# Patient Record
Sex: Male | Born: 1945
Health system: Southern US, Community
[De-identification: ages and names within clinical notes are randomized; demographics above are authoritative.]

## PROBLEM LIST (undated history)

## (undated) ENCOUNTER — Emergency Department (HOSPITAL_COMMUNITY): Discharge status: Absent without leave | Payer: Self-pay

## (undated) DIAGNOSIS — I119 Hypertensive heart disease without heart failure: Secondary | ICD-10-CM

## (undated) DIAGNOSIS — I2510 Atherosclerotic heart disease of native coronary artery without angina pectoris: Secondary | ICD-10-CM

## (undated) DIAGNOSIS — I5022 Chronic systolic (congestive) heart failure: Secondary | ICD-10-CM

## (undated) DIAGNOSIS — G4733 Obstructive sleep apnea (adult) (pediatric): Secondary | ICD-10-CM

## (undated) DIAGNOSIS — E785 Hyperlipidemia, unspecified: Secondary | ICD-10-CM

## (undated) DIAGNOSIS — I2699 Other pulmonary embolism without acute cor pulmonale: Secondary | ICD-10-CM

## (undated) DIAGNOSIS — I480 Paroxysmal atrial fibrillation: Secondary | ICD-10-CM

## (undated) DIAGNOSIS — Z9289 Personal history of other medical treatment: Secondary | ICD-10-CM

## (undated) DIAGNOSIS — I219 Acute myocardial infarction, unspecified: Secondary | ICD-10-CM

## (undated) DIAGNOSIS — J45909 Unspecified asthma, uncomplicated: Secondary | ICD-10-CM

## (undated) DIAGNOSIS — I255 Ischemic cardiomyopathy: Secondary | ICD-10-CM

## (undated) DIAGNOSIS — I10 Essential (primary) hypertension: Secondary | ICD-10-CM

## (undated) DIAGNOSIS — I82409 Acute embolism and thrombosis of unspecified deep veins of unspecified lower extremity: Secondary | ICD-10-CM

## (undated) HISTORY — DX: Hypertensive heart disease without heart failure: I11.9

## (undated) HISTORY — PX: UMBILICAL HERNIA REPAIR: SHX196

## (undated) HISTORY — PX: TONSILLECTOMY: SUR1361

## (undated) HISTORY — PX: FRACTURE SURGERY: SHX138

## (undated) HISTORY — DX: Ischemic cardiomyopathy: I25.5

## (undated) HISTORY — DX: Atherosclerotic heart disease of native coronary artery without angina pectoris: I25.10

## (undated) HISTORY — DX: Chronic systolic (congestive) heart failure: I50.22

## (undated) HISTORY — PX: HERNIA REPAIR: SHX51

---

## 2013-04-01 DIAGNOSIS — Z9289 Personal history of other medical treatment: Secondary | ICD-10-CM

## 2013-04-01 HISTORY — DX: Personal history of other medical treatment: Z92.89

## 2014-12-02 DIAGNOSIS — I219 Acute myocardial infarction, unspecified: Secondary | ICD-10-CM

## 2014-12-02 DIAGNOSIS — I82409 Acute embolism and thrombosis of unspecified deep veins of unspecified lower extremity: Secondary | ICD-10-CM

## 2014-12-02 HISTORY — DX: Acute myocardial infarction, unspecified: I21.9

## 2014-12-02 HISTORY — DX: Acute embolism and thrombosis of unspecified deep veins of unspecified lower extremity: I82.409

## 2014-12-07 ENCOUNTER — Inpatient Hospital Stay (HOSPITAL_COMMUNITY)
Admission: EM | Admit: 2014-12-07 | Discharge: 2014-12-15 | DRG: 270 | Disposition: A | Discharge status: Home / Self Care | Payer: 59 | Source: Ambulatory Visit | Attending: Cardiology | Admitting: Cardiology

## 2014-12-07 ENCOUNTER — Encounter (HOSPITAL_COMMUNITY): Payer: Self-pay | Admitting: Physician Assistant

## 2014-12-07 ENCOUNTER — Encounter (HOSPITAL_COMMUNITY)
Admission: EM | Disposition: A | Discharge status: Home / Self Care | Payer: Medicare Other | Source: Ambulatory Visit | Attending: Cardiology

## 2014-12-07 ENCOUNTER — Inpatient Hospital Stay (HOSPITAL_COMMUNITY): Payer: 59

## 2014-12-07 DIAGNOSIS — I5041 Acute combined systolic (congestive) and diastolic (congestive) heart failure: Secondary | ICD-10-CM | POA: Insufficient documentation

## 2014-12-07 DIAGNOSIS — I2119 ST elevation (STEMI) myocardial infarction involving other coronary artery of inferior wall: Secondary | ICD-10-CM | POA: Diagnosis present

## 2014-12-07 DIAGNOSIS — R570 Cardiogenic shock: Secondary | ICD-10-CM | POA: Diagnosis not present

## 2014-12-07 DIAGNOSIS — M10071 Idiopathic gout, right ankle and foot: Secondary | ICD-10-CM | POA: Diagnosis not present

## 2014-12-07 DIAGNOSIS — Z8249 Family history of ischemic heart disease and other diseases of the circulatory system: Secondary | ICD-10-CM | POA: Diagnosis not present

## 2014-12-07 DIAGNOSIS — I4901 Ventricular fibrillation: Secondary | ICD-10-CM

## 2014-12-07 DIAGNOSIS — I82491 Acute embolism and thrombosis of other specified deep vein of right lower extremity: Secondary | ICD-10-CM | POA: Diagnosis not present

## 2014-12-07 DIAGNOSIS — I2510 Atherosclerotic heart disease of native coronary artery without angina pectoris: Secondary | ICD-10-CM | POA: Diagnosis present

## 2014-12-07 DIAGNOSIS — I2690 Septic pulmonary embolism without acute cor pulmonale: Secondary | ICD-10-CM | POA: Diagnosis not present

## 2014-12-07 DIAGNOSIS — R0600 Dyspnea, unspecified: Secondary | ICD-10-CM | POA: Diagnosis not present

## 2014-12-07 DIAGNOSIS — N179 Acute kidney failure, unspecified: Secondary | ICD-10-CM | POA: Diagnosis present

## 2014-12-07 DIAGNOSIS — I82441 Acute embolism and thrombosis of right tibial vein: Secondary | ICD-10-CM | POA: Diagnosis not present

## 2014-12-07 DIAGNOSIS — I110 Hypertensive heart disease with heart failure: Secondary | ICD-10-CM | POA: Diagnosis not present

## 2014-12-07 DIAGNOSIS — I442 Atrioventricular block, complete: Secondary | ICD-10-CM | POA: Diagnosis not present

## 2014-12-07 DIAGNOSIS — E785 Hyperlipidemia, unspecified: Secondary | ICD-10-CM | POA: Diagnosis present

## 2014-12-07 DIAGNOSIS — M1000 Idiopathic gout, unspecified site: Secondary | ICD-10-CM | POA: Diagnosis not present

## 2014-12-07 DIAGNOSIS — I959 Hypotension, unspecified: Secondary | ICD-10-CM | POA: Diagnosis present

## 2014-12-07 DIAGNOSIS — R319 Hematuria, unspecified: Secondary | ICD-10-CM | POA: Diagnosis not present

## 2014-12-07 DIAGNOSIS — I480 Paroxysmal atrial fibrillation: Secondary | ICD-10-CM | POA: Insufficient documentation

## 2014-12-07 DIAGNOSIS — I2111 ST elevation (STEMI) myocardial infarction involving right coronary artery: Secondary | ICD-10-CM | POA: Diagnosis not present

## 2014-12-07 DIAGNOSIS — I517 Cardiomegaly: Secondary | ICD-10-CM | POA: Diagnosis not present

## 2014-12-07 DIAGNOSIS — I2699 Other pulmonary embolism without acute cor pulmonale: Secondary | ICD-10-CM | POA: Diagnosis not present

## 2014-12-07 DIAGNOSIS — I10 Essential (primary) hypertension: Secondary | ICD-10-CM | POA: Diagnosis not present

## 2014-12-07 DIAGNOSIS — M7989 Other specified soft tissue disorders: Secondary | ICD-10-CM

## 2014-12-07 DIAGNOSIS — G4733 Obstructive sleep apnea (adult) (pediatric): Secondary | ICD-10-CM | POA: Diagnosis present

## 2014-12-07 DIAGNOSIS — I219 Acute myocardial infarction, unspecified: Secondary | ICD-10-CM

## 2014-12-07 DIAGNOSIS — I213 ST elevation (STEMI) myocardial infarction of unspecified site: Secondary | ICD-10-CM

## 2014-12-07 DIAGNOSIS — Z9861 Coronary angioplasty status: Secondary | ICD-10-CM

## 2014-12-07 HISTORY — DX: Obstructive sleep apnea (adult) (pediatric): G47.33

## 2014-12-07 HISTORY — PX: CARDIAC CATHETERIZATION: SHX172

## 2014-12-07 HISTORY — DX: Paroxysmal atrial fibrillation: I48.0

## 2014-12-07 HISTORY — DX: Essential (primary) hypertension: I10

## 2014-12-07 HISTORY — DX: Other pulmonary embolism without acute cor pulmonale: I26.99

## 2014-12-07 HISTORY — DX: Hyperlipidemia, unspecified: E78.5

## 2014-12-07 LAB — CBC WITH DIFFERENTIAL/PLATELET
BASOS ABS: 0 10*3/uL (ref 0.0–0.1)
Basophils Relative: 0 %
EOS ABS: 0.1 10*3/uL (ref 0.0–0.7)
EOS PCT: 1 %
HCT: 40.5 % (ref 39.0–52.0)
Hemoglobin: 13.3 g/dL (ref 13.0–17.0)
LYMPHS PCT: 11 %
Lymphs Abs: 1.3 10*3/uL (ref 0.7–4.0)
MCH: 29.8 pg (ref 26.0–34.0)
MCHC: 32.8 g/dL (ref 30.0–36.0)
MCV: 90.6 fL (ref 78.0–100.0)
MONO ABS: 0.8 10*3/uL (ref 0.1–1.0)
Monocytes Relative: 7 %
Neutro Abs: 9.4 10*3/uL — ABNORMAL HIGH (ref 1.7–7.7)
Neutrophils Relative %: 81 %
PLATELETS: 324 10*3/uL (ref 150–400)
RBC: 4.47 MIL/uL (ref 4.22–5.81)
RDW: 13.8 % (ref 11.5–15.5)
WBC: 11.5 10*3/uL — ABNORMAL HIGH (ref 4.0–10.5)

## 2014-12-07 LAB — APTT: APTT: 158 s — AB (ref 24–37)

## 2014-12-07 LAB — POCT ACTIVATED CLOTTING TIME: ACTIVATED CLOTTING TIME: 368 s

## 2014-12-07 LAB — BRAIN NATRIURETIC PEPTIDE: B NATRIURETIC PEPTIDE 5: 45 pg/mL (ref 0.0–100.0)

## 2014-12-07 LAB — POCT I-STAT, CHEM 8
BUN: 20 mg/dL (ref 6–20)
Calcium, Ion: 1.14 mmol/L (ref 1.13–1.30)
Chloride: 101 mmol/L (ref 101–111)
Creatinine, Ser: 1.2 mg/dL (ref 0.61–1.24)
Glucose, Bld: 208 mg/dL — ABNORMAL HIGH (ref 65–99)
HEMATOCRIT: 44 % (ref 39.0–52.0)
HEMOGLOBIN: 15 g/dL (ref 13.0–17.0)
Potassium: 4.3 mmol/L (ref 3.5–5.1)
SODIUM: 141 mmol/L (ref 135–145)
TCO2: 23 mmol/L (ref 0–100)

## 2014-12-07 LAB — COMPREHENSIVE METABOLIC PANEL
ALT: 73 U/L — ABNORMAL HIGH (ref 17–63)
ANION GAP: 8 (ref 5–15)
AST: 184 U/L — ABNORMAL HIGH (ref 15–41)
Albumin: 3.1 g/dL — ABNORMAL LOW (ref 3.5–5.0)
Alkaline Phosphatase: 53 U/L (ref 38–126)
BILIRUBIN TOTAL: 0.4 mg/dL (ref 0.3–1.2)
BUN: 16 mg/dL (ref 6–20)
CHLORIDE: 103 mmol/L (ref 101–111)
CO2: 25 mmol/L (ref 22–32)
Calcium: 8.3 mg/dL — ABNORMAL LOW (ref 8.9–10.3)
Creatinine, Ser: 1.31 mg/dL — ABNORMAL HIGH (ref 0.61–1.24)
GFR, EST NON AFRICAN AMERICAN: 54 mL/min — AB (ref >60)
Glucose, Bld: 199 mg/dL — ABNORMAL HIGH (ref 65–99)
POTASSIUM: 3.8 mmol/L (ref 3.5–5.1)
Sodium: 136 mmol/L (ref 135–145)
TOTAL PROTEIN: 5.3 g/dL — AB (ref 6.5–8.1)

## 2014-12-07 LAB — BASIC METABOLIC PANEL
Anion gap: 10 (ref 5–15)
BUN: 17 mg/dL (ref 6–20)
CHLORIDE: 105 mmol/L (ref 101–111)
CO2: 26 mmol/L (ref 22–32)
Calcium: 8.9 mg/dL (ref 8.9–10.3)
Creatinine, Ser: 1.12 mg/dL (ref 0.61–1.24)
GFR calc non Af Amer: >60 mL/min (ref >60)
Glucose, Bld: 181 mg/dL — ABNORMAL HIGH (ref 65–99)
POTASSIUM: 4.8 mmol/L (ref 3.5–5.1)
SODIUM: 141 mmol/L (ref 135–145)

## 2014-12-07 LAB — LACTIC ACID, PLASMA: Lactic Acid, Venous: 1.2 mmol/L (ref 0.5–2.0)

## 2014-12-07 LAB — LIPID PANEL
CHOL/HDL RATIO: 4.4 ratio
Cholesterol: 157 mg/dL (ref 0–200)
HDL: 36 mg/dL — ABNORMAL LOW (ref >40)
LDL Cholesterol: 109 mg/dL — ABNORMAL HIGH (ref 0–99)
Triglycerides: 61 mg/dL (ref <150)
VLDL: 12 mg/dL (ref 0–40)

## 2014-12-07 LAB — PROTIME-INR
INR: 9.06 (ref 0.00–1.49)
INR: 1.36 (ref 0.00–1.49)
PROTHROMBIN TIME: 70.3 s — AB (ref 11.6–15.2)
Prothrombin Time: 16.9 seconds — ABNORMAL HIGH (ref 11.6–15.2)

## 2014-12-07 LAB — GLUCOSE, CAPILLARY
GLUCOSE-CAPILLARY: 146 mg/dL — AB (ref 65–99)
GLUCOSE-CAPILLARY: 129 mg/dL — AB (ref 65–99)

## 2014-12-07 LAB — MAGNESIUM: MAGNESIUM: 1.9 mg/dL (ref 1.7–2.4)

## 2014-12-07 LAB — MRSA PCR SCREENING: MRSA BY PCR: NEGATIVE

## 2014-12-07 LAB — TROPONIN I: TROPONIN I: 32.08 ng/mL — AB (ref <0.031)

## 2014-12-07 LAB — HEPARIN LEVEL (UNFRACTIONATED): HEPARIN UNFRACTIONATED: 0.34 [IU]/mL (ref 0.30–0.70)

## 2014-12-07 LAB — TSH: TSH: 2.802 u[IU]/mL (ref 0.350–4.500)

## 2014-12-07 LAB — PLATELET COUNT: PLATELETS: 342 10*3/uL (ref 150–400)

## 2014-12-07 SURGERY — CORONARY STENT INTERVENTION

## 2014-12-07 MED ORDER — BIVALIRUDIN 250 MG IV SOLR
INTRAVENOUS | Status: AC
  Filled 2014-12-07: qty 250

## 2014-12-07 MED ORDER — SODIUM CHLORIDE 0.9 % IV SOLN: 250.0000 mL | INTRAVENOUS | Status: DC | PRN

## 2014-12-07 MED ORDER — SODIUM CHLORIDE 0.9 % WEIGHT BASED INFUSION
1.0000 mL/kg/h | INTRAVENOUS | Status: AC
  Administered 2014-12-07: 1 mL/kg/h via INTRAVENOUS

## 2014-12-07 MED ORDER — MIDAZOLAM HCL 2 MG/2ML IJ SOLN
INTRAMUSCULAR | Status: AC
  Filled 2014-12-07: qty 2

## 2014-12-07 MED ORDER — NITROGLYCERIN 0.4 MG SL SUBL: 0.4000 mg | SUBLINGUAL_TABLET | SUBLINGUAL | Status: DC | PRN

## 2014-12-07 MED ORDER — TICAGRELOR 90 MG PO TABS
ORAL_TABLET | ORAL | Status: AC
  Filled 2014-12-07: qty 1

## 2014-12-07 MED ORDER — AMIODARONE HCL IN DEXTROSE 360-4.14 MG/200ML-% IV SOLN
INTRAVENOUS | Status: AC
  Administered 2014-12-07: 83.3 mL
  Filled 2014-12-07: qty 200

## 2014-12-07 MED ORDER — FENTANYL CITRATE (PF) 100 MCG/2ML IJ SOLN
INTRAMUSCULAR | Status: AC
  Filled 2014-12-07: qty 2

## 2014-12-07 MED ORDER — TIROFIBAN HCL IN NACL 5-0.9 MG/100ML-% IV SOLN
0.1500 ug/kg/min | INTRAVENOUS | Status: AC
  Administered 2014-12-07 (×2): 0.15 ug/kg/min via INTRAVENOUS
  Filled 2014-12-07 (×5): qty 100

## 2014-12-07 MED ORDER — CETYLPYRIDINIUM CHLORIDE 0.05 % MT LIQD
7.0000 mL | Freq: Two times a day (BID) | OROMUCOSAL | Status: DC
  Administered 2014-12-07 – 2014-12-15 (×15): 7 mL via OROMUCOSAL

## 2014-12-07 MED ORDER — DEXTROSE 5 % IV SOLN
0.0000 ug/min | INTRAVENOUS | Status: DC
  Administered 2014-12-07: 1 ug/min via INTRAVENOUS
  Filled 2014-12-07: qty 4

## 2014-12-07 MED ORDER — SODIUM CHLORIDE 0.9 % IJ SOLN: 3.0000 mL | INTRAMUSCULAR | Status: DC | PRN

## 2014-12-07 MED ORDER — SODIUM CHLORIDE 0.9 % IJ SOLN
3.0000 mL | Freq: Two times a day (BID) | INTRAMUSCULAR | Status: DC
Start: 2014-12-07 — End: 2014-12-10
  Administered 2014-12-07 – 2014-12-10 (×6): 3 mL via INTRAVENOUS

## 2014-12-07 MED ORDER — HEPARIN (PORCINE) IN NACL 2-0.9 UNIT/ML-% IJ SOLN
INTRAMUSCULAR | Status: AC
  Filled 2014-12-07: qty 1000

## 2014-12-07 MED ORDER — TICAGRELOR 90 MG PO TABS
ORAL_TABLET | ORAL | Status: DC | PRN
  Administered 2014-12-07: 180 mg via ORAL

## 2014-12-07 MED ORDER — SODIUM CHLORIDE 0.9 % IJ SOLN
3.0000 mL | Freq: Two times a day (BID) | INTRAMUSCULAR | Status: DC
  Administered 2014-12-07 – 2014-12-10 (×7): 3 mL via INTRAVENOUS

## 2014-12-07 MED ORDER — HEPARIN (PORCINE) IN NACL 100-0.45 UNIT/ML-% IJ SOLN
1000.0000 [IU]/h | INTRAMUSCULAR | Status: DC
  Administered 2014-12-07: 1000 [IU]/h via INTRAVENOUS
  Filled 2014-12-07 (×3): qty 250

## 2014-12-07 MED ORDER — FENTANYL CITRATE (PF) 100 MCG/2ML IJ SOLN
INTRAMUSCULAR | Status: DC | PRN
  Administered 2014-12-07: 50 ug via INTRAVENOUS

## 2014-12-07 MED ORDER — TICAGRELOR 90 MG PO TABS
90.0000 mg | ORAL_TABLET | Freq: Two times a day (BID) | ORAL | Status: DC
Start: 2014-12-07 — End: 2014-12-12
  Administered 2014-12-07 – 2014-12-12 (×10): 90 mg via ORAL
  Filled 2014-12-07 (×10): qty 1

## 2014-12-07 MED ORDER — SODIUM CHLORIDE 0.9 % IV SOLN
1.7500 mg/kg/h | INTRAVENOUS | Status: DC
  Filled 2014-12-07: qty 250

## 2014-12-07 MED ORDER — ATORVASTATIN CALCIUM 80 MG PO TABS
80.0000 mg | ORAL_TABLET | Freq: Every day | ORAL | Status: DC
  Administered 2014-12-07 – 2014-12-14 (×8): 80 mg via ORAL
  Filled 2014-12-07 (×8): qty 1

## 2014-12-07 MED ORDER — ONDANSETRON HCL 4 MG/2ML IJ SOLN
4.0000 mg | Freq: Four times a day (QID) | INTRAMUSCULAR | Status: DC | PRN
  Administered 2014-12-07: 4 mg via INTRAVENOUS
  Filled 2014-12-07: qty 2

## 2014-12-07 MED ORDER — NOREPINEPHRINE BITARTRATE 1 MG/ML IV SOLN
INTRAVENOUS | Status: AC
  Filled 2014-12-07: qty 4

## 2014-12-07 MED ORDER — PANTOPRAZOLE SODIUM 40 MG PO TBEC
40.0000 mg | DELAYED_RELEASE_TABLET | Freq: Every day | ORAL | Status: DC
  Administered 2014-12-07 – 2014-12-15 (×10): 40 mg via ORAL
  Filled 2014-12-07 (×9): qty 1

## 2014-12-07 MED ORDER — INSULIN ASPART 100 UNIT/ML ~~LOC~~ SOLN
0.0000 [IU] | Freq: Three times a day (TID) | SUBCUTANEOUS | Status: DC
  Administered 2014-12-07: 2 [IU] via SUBCUTANEOUS
  Administered 2014-12-09: 3 [IU] via SUBCUTANEOUS

## 2014-12-07 MED ORDER — BIVALIRUDIN BOLUS VIA INFUSION - CUPID
INTRAVENOUS | Status: DC | PRN
  Administered 2014-12-07: 74.775 mg via INTRAVENOUS

## 2014-12-07 MED ORDER — MIDAZOLAM HCL 2 MG/2ML IJ SOLN
INTRAMUSCULAR | Status: DC | PRN
  Administered 2014-12-07 (×2): 1 mg via INTRAVENOUS

## 2014-12-07 MED ORDER — ASPIRIN 81 MG PO CHEW
81.0000 mg | CHEWABLE_TABLET | Freq: Every day | ORAL | Status: DC
  Administered 2014-12-07 – 2014-12-15 (×9): 81 mg via ORAL
  Filled 2014-12-07 (×9): qty 1

## 2014-12-07 MED ORDER — SODIUM CHLORIDE 0.9 % IV SOLN
250.0000 mg | INTRAVENOUS | Status: DC | PRN
  Administered 2014-12-07 (×2): 1.75 mg/kg/h via INTRAVENOUS

## 2014-12-07 MED ORDER — METOPROLOL TARTRATE 12.5 MG HALF TABLET
12.5000 mg | ORAL_TABLET | Freq: Two times a day (BID) | ORAL | Status: DC
  Administered 2014-12-08 – 2014-12-12 (×9): 12.5 mg via ORAL
  Filled 2014-12-07 (×9): qty 1

## 2014-12-07 MED ORDER — ACETAMINOPHEN 325 MG PO TABS: 650.0000 mg | ORAL_TABLET | ORAL | Status: DC | PRN

## 2014-12-07 MED ORDER — NITROGLYCERIN 1 MG/10 ML FOR IR/CATH LAB
INTRA_ARTERIAL | Status: AC
  Filled 2014-12-07: qty 10

## 2014-12-07 MED ORDER — NOREPINEPHRINE BITARTRATE 1 MG/ML IV SOLN
4.0000 mg | INTRAVENOUS | Status: DC | PRN
  Administered 2014-12-07: 1 ug/min via INTRAVENOUS

## 2014-12-07 MED ORDER — LIDOCAINE HCL (PF) 1 % IJ SOLN
INTRAMUSCULAR | Status: AC
  Filled 2014-12-07: qty 30

## 2014-12-07 MED ORDER — TIROFIBAN HCL IV 12.5 MG/250 ML
INTRAVENOUS | Status: DC | PRN
  Administered 2014-12-07: .15 ug/kg/min via INTRAVENOUS

## 2014-12-07 MED ORDER — TIROFIBAN HCL IV 12.5 MG/250 ML
INTRAVENOUS | Status: AC
  Filled 2014-12-07: qty 250

## 2014-12-07 MED ORDER — TIROFIBAN (AGGRASTAT) BOLUS VIA INFUSION
INTRAVENOUS | Status: DC | PRN
Start: 2014-12-07 — End: 2014-12-07
  Administered 2014-12-07: 2492.5 ug via INTRAVENOUS

## 2014-12-07 SURGICAL SUPPLY — 22 items
BALLN EMERGE MR 2.5X15 (BALLOONS) ×2
BALLN EMERGE MR 3.0X20 (BALLOONS) ×2
BALLN LINEAR 7.5FR IABP 40CC (BALLOONS) ×2
BALLOON EMERGE MR 2.5X15 (BALLOONS) ×1 IMPLANT
BALLOON EMERGE MR 3.0X20 (BALLOONS) ×1 IMPLANT
BALLOON LINEAR 7.5FR IABP 40CC (BALLOONS) ×1 IMPLANT
CATH EXTRAC PRONTO 5.5F 138CM (CATHETERS) ×2 IMPLANT
CATH INFINITI 5FR MULTPACK ANG (CATHETERS) ×2 IMPLANT
CATH S G BIP PACING (SET/KITS/TRAYS/PACK) ×2 IMPLANT
DEVICE SECURE STATLOCK IABP (MISCELLANEOUS) ×4 IMPLANT
GUIDE CATH RUNWAY 6FR AR1 SH (CATHETERS) IMPLANT
GUIDE CATH RUNWAY 6FR FR4 (CATHETERS) ×2 IMPLANT
KIT ENCORE 26 ADVANTAGE (KITS) ×2 IMPLANT
KIT HEART LEFT (KITS) ×2 IMPLANT
PACK CARDIAC CATHETERIZATION (CUSTOM PROCEDURE TRAY) ×2 IMPLANT
SHEATH PINNACLE 6F 10CM (SHEATH) ×4 IMPLANT
STENT PROMUS PREM MR 4.0X20 (Permanent Stent) ×2 IMPLANT
TRANSDUCER W/STOPCOCK (MISCELLANEOUS) ×2 IMPLANT
TUBING CIL FLEX 10 FLL-RA (TUBING) ×2 IMPLANT
WIRE ASAHI PROWATER 180CM (WIRE) ×2 IMPLANT
WIRE EMERALD 3MM-J .035X150CM (WIRE) ×2 IMPLANT
WIRE PT2 MS 185 (WIRE) ×2 IMPLANT

## 2014-12-07 NOTE — Code Documentation (Signed)
  Patient Name: Ryan Ward   MRN: [id number]   Date of Birth/ Sex: [birth year] , male      Admission Date: 12/07/2014  Attending Provider: Peter M Martinique, MD  Primary Diagnosis: ST elevation (STEMI) myocardial infarction involving right coronary artery [facility])   Indication: Pt was in his usual state of health until this AM, when he was noted to be in VFib. Code blue was subsequently called. At the time of arrival on scene, ACLS protocol was underway.   Technical Description:  - CPR performance duration:  5  minutes  - Was defibrillation or cardioversion used? Yes   - Was external pacer placed? No  - Was patient intubated pre/post CPR? No   Medications Administered: Y = Yes; Blank = No Amiodarone    Atropine    Calcium    Epinephrine    Lidocaine    Magnesium    Norepinephrine    Phenylephrine    Sodium bicarbonate    Vasopressin     Post CPR evaluation:  - Final Status - Was patient successfully resuscitated ? Yes - What is current rhythm? Normal sinus - What is current hemodynamic status? Stable  Miscellaneous Information:  - Labs sent, including:    - Primary team notified?  Yes  - Family Notified? Yes  - Additional notes/ transfer status:       Norval Gable, MD  12/07/2014, 9:17 AM

## 2014-12-07 NOTE — Plan of Care (Signed)
Problem: Consults Goal: Skin Care Protocol Initiated - if Braden Score 18 or less If consults are not indicated, leave blank or document N/A Outcome: Progressing Patient compliant and cooperative with Q2 turning  Problem: Phase I Progression Outcomes Goal: Neurologically stable/at baseline (CEA/CES) Outcome: Completed/Met Date Met:  12/07/14 Patient alert and oriented x4.  Goal: Peripheral pulses at baseline (AAA) Outcome: Progressing Able to doppler pedal pulses, palpable radial pulses. Goal: Pain controlled with appropriate interventions Outcome: Progressing Patient with no complaints of pain.

## 2014-12-07 NOTE — Progress Notes (Signed)
  Echocardiogram 2D Echocardiogram has been performed.  Darlina Sicilian M 12/07/2014, 3:43 PM

## 2014-12-07 NOTE — Progress Notes (Signed)
CRITICAL VALUE ALERT  Critical value received:  Troponin >65  Date of notification: 12-06-13  Time of notification:  Reviewed via lab work, no call  Critical value read back: yes  Nurse who received alert:  Josph Macho  MD notified (1st page):  MD Jose Persia  Time of first page:  2050  MD notified (2nd page):  Time of second page:  Responding MD:  MD Jose Persia  Time MD responded:   MD also notified of patient oozing around venous and arterial sheath present in right grown. Urine noted to be pink tinged from foley cath as well. No signs or symptoms of hematoma and patient hemodynamically stable. Pressure dressing applied to site and dressing reinforced after changing.  No new orders at this time, will continue to monitor the patient closely at this time.

## 2014-12-07 NOTE — H&P (Signed)
Patient ID: Ryan Ward MRN: [id number], DOB/AGE: 69-Jun-1947   Admit date: 12/07/2014   Primary Physician: No primary care provider on file. Primary Cardiologist: new  Pt. Profile:   69 year old Caucasian male with past medical history of hypertension, hyperlipidemia, obstructive sleep apnea not on CPAP presented with inferolateral STEMI  Problem List  Past Medical History  Diagnosis Date  . OSA (obstructive sleep apnea)     not using CPAP at home, does have machine  . Hypertension   . Hyperlipidemia     Past Surgical History  Procedure Laterality Date  . Hernia repair       Allergies  No Known Allergies  HPI  The patient is a 69 year old Caucasian male with past medical history of hypertension, hyperlipidemia, obstructive sleep apnea not on CPAP. He does have sleep apnea at home, however he has not used it. According to family, patient had a cold-like symptom and generalized malaise for the past 2 weeks. The symptoms eventually worsened to the degree that he decided to seek medical attention on 12/07/2014. Initial EKG was concerning for inferolateral STEMI, and he was sent to [facility] directly for emergent cardiac catheterization. During the cath, he had complete heart block and cardiogenic shock. He was placed on Levophed. Cardiac catheterization showed an 85% proximal to mid LAD, 95% mid LAD, 80% ostial left circumflex, 100% distal RCA, EF 35-45%. The RCA lesion was treated with a Promus DES.  Of note, during the interview post cath, patient went into ventricular fibrillation when pacing spikes landed on T-wave. He had spontaneous drop in the blood pressure. Patient appears to be pale and was unresponsive. CODE BLUE was initiated. CPR was immediately started. He was defibrillated once back into sinus rhythm. Patient was seen by Dr. Tamala Julian and was given 150 bolus IV amiodarone. Total duration of V. fib event was less than 1-2 minute. And the patient regarding his  consciousness after defibrillation.   Home Medications  Prior to Admission medications   Medication Sig Start Date End Date Taking? Authorizing Provider  Cholecalciferol (VITAMIN D3) 5000 UNITS TABS Take 1 tablet by mouth daily.   Yes Historical Provider, MD  Multiple Vitamins-Minerals (CENTRUM SILVER) tablet Take 1 tablet by mouth daily.   Yes Historical Provider, MD    Family History  Family History  Problem Relation Age of Onset  . Heart attack Father     Social History  Social History   Social History  . Marital Status: Married    Spouse Name: N/A  . Number of Children: N/A  . Years of Education: N/A   Occupational History  . Not on file.   Social History Main Topics  . Smoking status: Never Smoker   . Smokeless tobacco: Not on file  . Alcohol Use: No  . Drug Use: No  . Sexual Activity: Not on file   Other Topics Concern  . Not on file   Social History Narrative  . No narrative on file     Review of Systems General:  No chills, fever, night sweats or weight changes.  Cardiovascular:  No edema, orthopnea, palpitations, paroxysmal nocturnal dyspnea. +dyspnea Dermatological: No rash, lesions/masses Respiratory: No cough, dyspnea Urologic: No hematuria, dysuria Abdominal:   No vomiting, diarrhea, bright red blood per rectum, melena, or hematemesis +abdominal pain. Nausea Neurologic:  No visual changes, wkns, changes in mental status. All other systems reviewed and are otherwise negative except as noted above.  Physical Exam  Blood pressure 82/50, pulse 288,  resp. rate 0, height 5\' 8"  (1.727 m), weight 219 lb 12.8 oz (99.7 kg), SpO2 0 %.  General: Pleasant, NAD Psych: Normal affect. Neuro: Alert and oriented X 3. Moves all extremities spontaneously. HEENT: Normal  Neck: Supple without bruits or JVD. Lungs:  Resp regular and unlabored, CTA. Heart: RRR no s3, s4, or murmurs. Abdomen: Soft, non-tender, non-distended, BS + x 4.  Extremities: No clubbing,  cyanosis or edema. Pulse weak. Extremities cold  Labs  Troponin (Point of Care Test) No results for input(s): TROPIPOC in the last 72 hours.  Recent Labs  12/07/14 0750  TROPONINI 32.08*   Lab Results  Component Value Date   WBC 11.5* 12/07/2014   HGB 13.3 12/07/2014   HCT 40.5 12/07/2014   MCV 90.6 12/07/2014   PLT 324 12/07/2014     Recent Labs Lab 12/07/14 0750  NA 136  K 3.8  CL 103  CO2 25  BUN 16  CREATININE 1.31*  CALCIUM 8.3*  PROT 5.3*  BILITOT 0.4  ALKPHOS 53  ALT 73*  AST 184*  GLUCOSE 199*   Lab Results  Component Value Date   CHOL 157 12/07/2014   HDL 36* 12/07/2014   LDLCALC 109* 12/07/2014   TRIG 61 12/07/2014   No results found for: DDIMER   Radiology/Studies  No results found.  ECG  NSR with ST elevation in inferior lead and corresponding ST depression in anterior leads  Echocardiogram  pending    ASSESSMENT AND PLAN  1. Inferolateral STEMI  - cath 12/07/2014 85% proximal to mid LAD, 95% mid LAD, 80% ostial left circumflex, 100% distal RCA, EF 35-45%. Received Promus DES, cath report incomplete, presumably in distal RCA.  - pending echo. Trend enzyme. INR > 9 likely related to medication received in cath lab, will repeat INR in 6 hours  2. Vfib arrest: converted to NSR after defibillation  - pacing spike on T wave before went into vfib. S/p Code Blue, converted to NSR after defibillation.  - discussed with Dr. Tamala Julian, given 150mg  bolus amio, will hold off on amio drip  3. Complete heart block: had temp pacing wire in cath lab, discontinued later as he likely went into vfib related to inappropriate pacing  4. Cardiogenic shock: continue levophed, wean as tolerated  5. Untreated OSA: has CPAP has home, does not use it  - per nurse, patient has been having apnea episode post cath  5. HTN 6. HLD   Signed, Almyra Deforest, PA-C 12/07/2014, 9:46 AM Patient seen and examined and history reviewed. Agree with above findings and plan.  69 yo WM with history of HTN in past. Presents with acute onset of chest pain and diaphoresis that started aat 3 am. Ecg shows marked bradycardia with junctional rhythm versus Afib. Marked ST elevation in the inferior and lateral leads. ST depression suggests posterior involvement as well. Patient taken for emergent cardiac cath and PCI. RCA occlusion with extensive thrombus. PTCA and stenting of the distal RCA with DES. Procedure complicated by cardiogenic shock and IABP placed and IV pressors started. Temporary pacer placed for CHB. Developed Vfib post procedure after arrival in the ICU and was defibrillated x 1. Most likely reperfusion arrhythmia.  The patient is critically ill requires high complexity decision making for assessment and support, frequent evaluation and titration of therapies, application of advanced monitoring technologies and extensive interpretation of multiple databases.    Xenia Nile Martinique, Andrew 12/07/2014 11:34 AM

## 2014-12-07 NOTE — Progress Notes (Signed)
ANTICOAGULATION CONSULT NOTE - Initial Consult  Pharmacy Consult for Heparin and Tirofiban Indication: STEMI - s/p PCI to distal RCA; now with IABP  Allergies not on file  Patient Measurements: Height: 5\' 8"  (172.7 cm) Weight: 219 lb 12.8 oz (99.7 kg) IBW/kg (Calculated) : 68.4 Heparin Dosing Weight: 90 kg  Vital Signs:    Labs: No results for input(s): HGB, HCT, PLT, APTT, LABPROT, INR, HEPARINUNFRC, CREATININE, CKTOTAL, CKMB, TROPONINI in the last 72 hours.  CrCl cannot be calculated (Patient has no serum creatinine result on file.).   Medical History: No past medical history on file.  Medications:  Awaiting home med rec  Assessment: 69 y.o. M presented as STEMI - taken directly to cath lab s/p PCI to distal RCA, pt with multiple clots also requiring angiomax, ticagrelor, and tirofiban in cath lab. Also required IABP placement.   SCr, Hgb, Hct wnl on i-stat.  Goal of Therapy:  Heparin level 0.2-0.5 units/ml Monitor platelets by anticoagulation protocol: Yes   Plan:  Tirofiban 0.15 mcg/kg/min x 18hrs Heparin gtt 1000 units/hr Will f/u 6 hr heparin level and plt count Daily heparin level and CBC  Sherlon Handing, PharmD, BCPS Clinical pharmacist, pager [phone] 12/07/2014,7:50 AM

## 2014-12-07 NOTE — Progress Notes (Signed)
Utilization Review Completed.  

## 2014-12-07 NOTE — Progress Notes (Signed)
Pt is on CPAP at this time tolerating it well. Pt CPAP pressure is set at [employer]. No complications or distress noted. o2 bleed into the CPAP machine. Pt is stable at this time, RN aware

## 2014-12-07 NOTE — Progress Notes (Signed)
CRITICAL VALUE ALERT  Critical value received:  INR 9.06  Date of notification:  12/07/2014  Time of notification:  0910  Critical value read back:Yes.    Nurse who received alert:  Lily Kocher   MD notified (1st page):  Almyra Deforest (cards PA) at pts bedside  Time of first page:  0911  Responding MD:  Almyra Deforest (cards PA) at pts besdie  Time MD responded:  [phone]

## 2014-12-07 NOTE — Progress Notes (Signed)
Chaplain reported to the ED in response to a Code Stemi of patient who was taken directly to the Cath Lab.  Chaplain had no direct contact with patient at this time, but assisted the family to the Cath Lab to await the Dr. Report. Chaplain provided a prayer of comfort.  Chaplain will make referral to the assigned Unit Chaplain for follow-up.

## 2014-12-07 NOTE — Progress Notes (Signed)
ANTICOAGULATION CONSULT NOTE - FOLLOW UP    HL = 0.34 (goal 0.2 - 0.5 units/mL) Heparin dosing weight = 90 kg   Assessment: 55 YOM with STEMI s/p PCI to distal RCA and now with IABP.  Patient continues on IV heparin and tirofiban.  Heparin level is therapeutic.  No bleeding reported.   Plan: - Continue heparin gtt at 1000 units/hr - F/U AM labs   Jalan Bodi D. Mina Marble, PharmD, BCPS 12/07/2014, 6:45 PM

## 2014-12-07 NOTE — Progress Notes (Addendum)
   Called due to VF arrest post cath PCI for STEMI.  Had R on T pacer spike and then VF.   Brief CPR, reverted with Defib.[name] Potassium 3.8. Amiodarone 150 mg IV bolus x 1.  LVEDP 20 mmHg in lab. Had distal RCA stent. ECG post arrest with persistent STE due to distal emboli.  No ischemic CP post VF resuscitation.  Temporary pacer turned off and maintaining NSR .  Will speak with Dr. Martinique. Anticipate staged PCI vs CABG in future for residual LAD/Cfx.  Critical care time 35 minutes

## 2014-12-08 DIAGNOSIS — R319 Hematuria, unspecified: Secondary | ICD-10-CM

## 2014-12-08 DIAGNOSIS — I2510 Atherosclerotic heart disease of native coronary artery without angina pectoris: Secondary | ICD-10-CM

## 2014-12-08 DIAGNOSIS — I442 Atrioventricular block, complete: Secondary | ICD-10-CM

## 2014-12-08 DIAGNOSIS — Z9861 Coronary angioplasty status: Secondary | ICD-10-CM

## 2014-12-08 DIAGNOSIS — I4901 Ventricular fibrillation: Secondary | ICD-10-CM

## 2014-12-08 LAB — LIPID PANEL
Cholesterol: 148 mg/dL (ref 0–200)
HDL: 33 mg/dL — AB (ref >40)
LDL CALC: 96 mg/dL (ref 0–99)
TRIGLYCERIDES: 94 mg/dL (ref <150)
Total CHOL/HDL Ratio: 4.5 RATIO
VLDL: 19 mg/dL (ref 0–40)

## 2014-12-08 LAB — GLUCOSE, CAPILLARY
GLUCOSE-CAPILLARY: 114 mg/dL — AB (ref 65–99)
GLUCOSE-CAPILLARY: 94 mg/dL (ref 65–99)
Glucose-Capillary: 118 mg/dL — ABNORMAL HIGH (ref 65–99)
Glucose-Capillary: 127 mg/dL — ABNORMAL HIGH (ref 65–99)

## 2014-12-08 LAB — CBC
HCT: 40 % (ref 39.0–52.0)
Hemoglobin: 13.1 g/dL (ref 13.0–17.0)
MCH: 29.7 pg (ref 26.0–34.0)
MCHC: 32.8 g/dL (ref 30.0–36.0)
MCV: 90.7 fL (ref 78.0–100.0)
PLATELETS: 313 10*3/uL (ref 150–400)
RBC: 4.41 MIL/uL (ref 4.22–5.81)
RDW: 14.2 % (ref 11.5–15.5)
WBC: 17.4 10*3/uL — ABNORMAL HIGH (ref 4.0–10.5)

## 2014-12-08 LAB — HEPARIN LEVEL (UNFRACTIONATED): HEPARIN UNFRACTIONATED: 0.33 [IU]/mL (ref 0.30–0.70)

## 2014-12-08 LAB — BASIC METABOLIC PANEL
Anion gap: 7 (ref 5–15)
BUN: 18 mg/dL (ref 6–20)
CALCIUM: 8.4 mg/dL — AB (ref 8.9–10.3)
CO2: 24 mmol/L (ref 22–32)
CREATININE: 0.98 mg/dL (ref 0.61–1.24)
Chloride: 108 mmol/L (ref 101–111)
Glucose, Bld: 144 mg/dL — ABNORMAL HIGH (ref 65–99)
Potassium: 4.4 mmol/L (ref 3.5–5.1)
SODIUM: 139 mmol/L (ref 135–145)

## 2014-12-08 LAB — POCT ACTIVATED CLOTTING TIME: ACTIVATED CLOTTING TIME: 142 s

## 2014-12-08 LAB — HEMOGLOBIN A1C
HEMOGLOBIN A1C: 5.9 % — AB (ref 4.8–5.6)
MEAN PLASMA GLUCOSE: 123 mg/dL

## 2014-12-08 LAB — TROPONIN I

## 2014-12-08 MED ORDER — PNEUMOCOCCAL VAC POLYVALENT 25 MCG/0.5ML IJ INJ
0.5000 mL | INJECTION | INTRAMUSCULAR | Status: AC
  Administered 2014-12-13: 0.5 mL via INTRAMUSCULAR
  Filled 2014-12-08 (×3): qty 0.5

## 2014-12-08 MED FILL — Lidocaine HCl Local Preservative Free (PF) Inj 1%: INTRAMUSCULAR | Qty: 30 | Status: AC

## 2014-12-08 MED FILL — Nitroglycerin IV Soln 100 MCG/ML in D5W: INTRA_ARTERIAL | Qty: 10 | Status: AC

## 2014-12-08 MED FILL — Heparin Sodium (Porcine) 2 Unit/ML in Sodium Chloride 0.9%: INTRAMUSCULAR | Qty: 1000 | Status: AC

## 2014-12-08 NOTE — Care Management Note (Signed)
Case Management Note  Patient Details  Name: Ryan Ward MRN: [id number] Date of Birth: [birth year]  Subjective/Objective:   Pt reports that he has homes both in [place] and in [place], lives alone when he is in [place] and with significant other when he is in [place].  Was scheduled to appear for jury duty in [facility],  [place] will provide letter to fax to court.                              Expected Discharge Plan:  Home/Self Care  Discharge planning Services  CM Consult  Status of Service:  In process, will continue to follow  Girard Cooter, RN 12/08/2014, 1:52 PM

## 2014-12-08 NOTE — Progress Notes (Signed)
   12/08/14 2225  BiPAP/CPAP/SIPAP  BiPAP/CPAP/SIPAP Pt Type Adult  Mask Type Nasal mask  Mask Size Medium  IPAP 5 cmH20  EPAP 5 cmH2O  Flow Rate 3 lpm  BiPAP/CPAP/SIPAP CPAP  Patient Home Equipment No  Auto Titrate No  BiPAP/CPAP /SiPAP Vitals  Pulse Rate 90  Resp 18  SpO2 96 %  Patient placed on CPAP on above settings.

## 2014-12-08 NOTE — Progress Notes (Signed)
CARDIAC REHAB PHASE I   Spoke with pt RN, pt on bedrest this afternoon s/p sheath being pulled.  Will hold ambulation today and follow-up tomorrow.   Lenna Sciara, RN, BSN 12/08/2014 2:04 PM

## 2014-12-08 NOTE — Progress Notes (Signed)
Patient urine overnight progressing from pink tinged to red (blood) noted in foley bag.  PA [place] updated about patient condition at this time.  Patient remains hemodynamically stable, labs stable this am.  Still oozing around sites per previous conversation with MD Philbert Riser.  Day team to assess shortly, will continue to monitor the patient closely.

## 2014-12-08 NOTE — Progress Notes (Signed)
Patient Name: Ryan Ward Date of Encounter: 12/08/2014    SUBJECTIVE: Patient had a quiet night. No chest discomfort. Major issues have to do with bleeding around the balloon pump and hematuria. Aggrastat is been discontinued. Still on IV heparin.  TELEMETRY:  Normal sinus rhythm without significant ventricular ectopy or bradycardia. Filed Vitals:   12/08/14 0600 12/08/14 0700 12/08/14 0741 12/08/14 0800  BP: 94/82 111/76  99/78  Pulse: 87 82  27  Temp:   98.1 F (36.7 C)   TempSrc:   Oral   Resp: 18 23  20   Height:      Weight: 220 lb 7.4 oz (100 kg)     SpO2: 97% 98%  98%    Intake/Output Summary (Last 24 hours) at 12/08/14 0907 Last data filed at 12/08/14 0800  Gross per 24 hour  Intake 3870.6 ml  Output    845 ml  Net 3025.6 ml   LABS: Basic Metabolic Panel:  Recent Labs  12/07/14 0750 12/07/14 1340 12/08/14 0113  NA 136 141 139  K 3.8 4.8 4.4  CL 103 105 108  CO2 25 26 24   GLUCOSE 199* 181* 144*  BUN 16 17 18   CREATININE 1.31* 1.12 0.98  CALCIUM 8.3* 8.9 8.4*  MG 1.9  --   --    CBC:  Recent Labs  12/07/14 0750 12/07/14 1340 12/08/14 0113  WBC 11.5*  --  17.4*  NEUTROABS 9.4*  --   --   HGB 13.3  --  13.1  HCT 40.5  --  40.0  MCV 90.6  --  90.7  PLT 324 342 313   Cardiac Enzymes:  Recent Labs  12/07/14 1340 12/07/14 1910 12/08/14 0113  TROPONINI >65.00* >65.00* >65.00*    Fasting Lipid Panel:  Recent Labs  12/08/14 0113  CHOL 148  HDL 33*  LDLCALC 96  TRIG 94  CHOLHDL 4.5    Radiology/Studies:  No chest x-ray is available  Physical Exam: Blood pressure 99/78, pulse 27, temperature 98.1 F (36.7 C), temperature source Oral, resp. rate 20, height 5\' 8"  (1.727 m), weight 220 lb 7.4 oz (100 kg), SpO2 98 %. Weight change:   Wt Readings from Last 3 Encounters:  12/08/14 220 lb 7.4 oz (100 kg)   Lungs are clear anteriorly S4 gallop Oozing around intra-aortic balloon pump insertion site Neurologically  intact  ASSESSMENT:  1. Acute inferior infarction presenting with high-grade A-V block and cardiogenic shock. Successful management with reperfusion and temporary pacemaker insertion. 2. Ventricular fibrillation postprocedure related to pacer spike on T wave. 3. Cardiogenic shock with intra-aortic balloon pump, now weaning and 3:1 with plans for removal later this morning 4. Bleeding secondary to anticoagulation and antiplatelet regimen. Bleeding includes hematuria and peri-sheath oozing 4. Severe coronary artery disease with successful nice right coronary result. Right coronary is dominant. Circumflex is small. There is ostial 50-70% narrowing. Severe diffuse calcified proximal 80-90% LAD and focal distal 90% bifurcation lesion in LAD. LAD needs therapy, is complex for intervention but approachable. Plan will be for staged LAD intervention possibly with rotational atherectomy later this admission or electively in one to 2 weeks. This will depend upon the patient's clinical course once he begins to be mobile after removal of support apparatus. 5. Acute kidney injury related to hypotension and contrast, has resolved.   Plan:  1. Discontinue IV heparin 2. Continue dual antiplatelet therapy 3. Once INR is in range, discontinue both pacemaker and intra-aortic balloon pump 4. Start  low-dose beta blocker therapy as tolerated by heart rate 5. Phase I cardiac rehabilitation when ambulatory 6. Decrease IV fluid as tolerated by blood pressure 7. High-dose statin therapy 8. Coordinate interventional therapy with clinical course.  Valerie Roys W 12/08/2014, 9:07 AM

## 2014-12-08 NOTE — Progress Notes (Signed)
ANTICOAGULATION CONSULT NOTE - Follow Up Consult  Pharmacy Consult for Heparin  Indication: chest pain/ACS, s/p cath, IABP in place  No Known Allergies  Patient Measurements: Height: 5\' 8"  (172.7 cm) Weight: 219 lb 12.8 oz (99.7 kg) IBW/kg (Calculated) : 68.4  Vital Signs: Temp: 99 F (37.2 C) (12/06 2359) Temp Source: Oral (12/06 2359) BP: 110/63 mmHg (12/07 0100) Pulse Rate: 87 (12/07 0100)  Labs:  Recent Labs  12/07/14 0632  12/07/14 0750 12/07/14 1340 12/07/14 1500 12/07/14 1910 12/08/14 0113  HGB 15.0  --  13.3  --   --   --  13.1  HCT 44.0  --  40.5  --   --   --  40.0  PLT  --   --  324 342  --   --  313  APTT  --   --  158*  --   --   --   --   LABPROT  --   --  70.3* 16.9*  --   --   --   INR  --   --  9.06* 1.36  --   --   --   HEPARINUNFRC  --   --   --   --  0.34  --  0.33  CREATININE 1.20  --  1.31* 1.12  --   --  0.98  TROPONINI  --   < > 32.08* >65.00*  --  >65.00* >65.00*  < > = values in this interval not displayed.  Estimated Creatinine Clearance: 81.4 mL/min (by C-G formula based on Cr of 0.98).  Assessment: Heparin level therapeutic x 2, likely to have staged PCI or CABG in future per MD note  Goal of Therapy:  Heparin level 0.2-0.5 units/ml Monitor platelets by anticoagulation protocol: Yes   Plan:  -Continue heparin at 1000 units/hr -Daily CBC/HL -Monitor for bleeding  [name] 12/08/2014,2:47 AM

## 2014-12-08 NOTE — Progress Notes (Signed)
12:00pm Balloon pump and sheath removed from Rt Femoral Artery. Manual pressure held for 30 minutes. Hemostasis achieved. Initial BP 100/84 HR 87. Patient stable throughout sheath pull. 12:40pm Temporary Pacemaker and 48F Sheath removed from Rt Femoral Vein. Manual pressure held for 15 minutes. Hemostasis achieved. Post sheath pull BP 113/82, HR 84. 4x4 and tegaderm applied to Rt Femoral site. Patient states no pain. Post procedure bleeding precautions reviewed with patient. Irven Baltimore in the room and assessed groin site. Patient care transferred back to [facility]

## 2014-12-09 ENCOUNTER — Inpatient Hospital Stay (HOSPITAL_COMMUNITY): Payer: 59

## 2014-12-09 ENCOUNTER — Encounter (HOSPITAL_COMMUNITY): Payer: Self-pay | Admitting: *Deleted

## 2014-12-09 DIAGNOSIS — I5041 Acute combined systolic (congestive) and diastolic (congestive) heart failure: Secondary | ICD-10-CM

## 2014-12-09 LAB — CBC
HEMATOCRIT: 37.5 % — AB (ref 39.0–52.0)
Hemoglobin: 11.9 g/dL — ABNORMAL LOW (ref 13.0–17.0)
MCH: 29.4 pg (ref 26.0–34.0)
MCHC: 31.7 g/dL (ref 30.0–36.0)
MCV: 92.6 fL (ref 78.0–100.0)
PLATELETS: 253 10*3/uL (ref 150–400)
RBC: 4.05 MIL/uL — ABNORMAL LOW (ref 4.22–5.81)
RDW: 14.5 % (ref 11.5–15.5)
WBC: 13.7 10*3/uL — AB (ref 4.0–10.5)

## 2014-12-09 LAB — GLUCOSE, CAPILLARY
GLUCOSE-CAPILLARY: 160 mg/dL — AB (ref 65–99)
GLUCOSE-CAPILLARY: 101 mg/dL — AB (ref 65–99)
Glucose-Capillary: 100 mg/dL — ABNORMAL HIGH (ref 65–99)
Glucose-Capillary: 108 mg/dL — ABNORMAL HIGH (ref 65–99)

## 2014-12-09 LAB — RENAL FUNCTION PANEL
ANION GAP: 7 (ref 5–15)
Albumin: 3.1 g/dL — ABNORMAL LOW (ref 3.5–5.0)
BUN: 19 mg/dL (ref 6–20)
CALCIUM: 8.5 mg/dL — AB (ref 8.9–10.3)
CHLORIDE: 106 mmol/L (ref 101–111)
CO2: 27 mmol/L (ref 22–32)
CREATININE: 0.93 mg/dL (ref 0.61–1.24)
GFR calc non Af Amer: >60 mL/min (ref >60)
GLUCOSE: 142 mg/dL — AB (ref 65–99)
Phosphorus: 2 mg/dL — ABNORMAL LOW (ref 2.5–4.6)
Potassium: 4 mmol/L (ref 3.5–5.1)
SODIUM: 140 mmol/L (ref 135–145)

## 2014-12-09 LAB — HEPARIN LEVEL (UNFRACTIONATED): HEPARIN UNFRACTIONATED: 0.18 [IU]/mL — AB (ref 0.30–0.70)

## 2014-12-09 LAB — BRAIN NATRIURETIC PEPTIDE: B Natriuretic Peptide: 252.8 pg/mL — ABNORMAL HIGH (ref 0.0–100.0)

## 2014-12-09 MED ORDER — FUROSEMIDE 10 MG/ML IJ SOLN
40.0000 mg | Freq: Once | INTRAMUSCULAR | Status: AC
  Administered 2014-12-09: 40 mg via INTRAVENOUS
  Filled 2014-12-09: qty 4

## 2014-12-09 MED ORDER — HEPARIN BOLUS VIA INFUSION
2500.0000 [IU] | Freq: Once | INTRAVENOUS | Status: AC
  Administered 2014-12-09: 2500 [IU] via INTRAVENOUS
  Filled 2014-12-09: qty 2500

## 2014-12-09 MED ORDER — FUROSEMIDE 10 MG/ML IJ SOLN
20.0000 mg | Freq: Once | INTRAMUSCULAR | Status: AC
  Administered 2014-12-09: 20 mg via INTRAVENOUS
  Filled 2014-12-09: qty 2

## 2014-12-09 MED ORDER — HEPARIN BOLUS VIA INFUSION
3000.0000 [IU] | Freq: Once | INTRAVENOUS | Status: AC
  Administered 2014-12-09: 3000 [IU] via INTRAVENOUS
  Filled 2014-12-09: qty 3000

## 2014-12-09 MED ORDER — HEPARIN (PORCINE) IN NACL 100-0.45 UNIT/ML-% IJ SOLN
1500.0000 [IU]/h | INTRAMUSCULAR | Status: DC
  Administered 2014-12-09: 1100 [IU]/h via INTRAVENOUS
  Filled 2014-12-09 (×5): qty 250

## 2014-12-09 NOTE — Progress Notes (Signed)
Patient Name: Ryan Ward Date of Encounter: 12/09/2014    SUBJECTIVE: The patient has cough and congestion. No phlegm production. No recurrence of anginal quality discomfort. He denies dyspnea.  TELEMETRY:  Normal sinus rhythm/sinus tachycardia without ventricular ectopy or VT since discontinuation of amiodarone and pacemaker. Filed Vitals:   12/09/14 0713 12/09/14 0800 12/09/14 0900 12/09/14 0930  BP:  104/68 101/74 101/74  Pulse:  81 81 92  Temp: 98.3 F (36.8 C)     TempSrc: Oral     Resp:  26 17   Height:      Weight:      SpO2:  93% 96%     Intake/Output Summary (Last 24 hours) at 12/09/14 1108 Last data filed at 12/09/14 0900  Gross per 24 hour  Intake   2880 ml  Output    865 ml  Net   2015 ml   LABS: Basic Metabolic Panel:  Recent Labs  12/07/14 0750 12/07/14 1340 12/08/14 0113  NA 136 141 139  K 3.8 4.8 4.4  CL 103 105 108  CO2 25 26 24   GLUCOSE 199* 181* 144*  BUN 16 17 18   CREATININE 1.31* 1.12 0.98  CALCIUM 8.3* 8.9 8.4*  MG 1.9  --   --    CBC:  Recent Labs  12/07/14 0750  12/08/14 0113 12/09/14 0312  WBC 11.5*  --  17.4* 13.7*  NEUTROABS 9.4*  --   --   --   HGB 13.3  --  13.1 11.9*  HCT 40.5  --  40.0 37.5*  MCV 90.6  --  90.7 92.6  PLT 324  < > 313 253  < > = values in this interval not displayed. Cardiac Enzymes:  Recent Labs  12/07/14 1340 12/07/14 1910 12/08/14 0113  TROPONINI >65.00* >65.00* >65.00*   BNP: Invalid input(s): POCBNP Hemoglobin A1C:  Recent Labs  12/07/14 0750  HGBA1C 5.9*   Fasting Lipid Panel:  Recent Labs  12/08/14 0113  CHOL 148  HDL 33*  LDLCALC 96  TRIG 94  CHOLHDL 4.5    Radiology/Studies:  No chest x-ray performed this admission.  Physical Exam: Blood pressure 101/74, pulse 92, temperature 98.3 F (36.8 C), temperature source Oral, resp. rate 17, height 5\' 8"  (1.727 m), weight 221 lb 9 oz (100.5 kg), SpO2 96 %. Weight change: 1 lb 12.2 oz (0.8 kg)  Wt Readings from  Last 3 Encounters:  12/09/14 221 lb 9 oz (100.5 kg)    No pericardial friction rub, gallop, or murmur Moderate JVD with the patient lying at 45 Diminished breath sounds bilaterally with midlung field rales No peripheral edema. Right femoral cath site without hematoma. Pulse is 2+ and symmetric Neurological exam is normal  ASSESSMENT:  1. Status post acute inferior ST elevation myocardial infarction with associated cardiogenic shock. Successful right coronary reperfusion but, complicated by microembolization of the distal circulatory bed. 2. Ventricular fibrillation treated with defibrillation post cath. No return of ventricular ectopy. Episode felt to be related to pacemaker spike on T-wave. 3. Residual coronary artery disease, with staged LAD intervention pending either late in this admission or electively in several weeks. 4. Acute systolic heart failure with clinical evidence of pulmonary congestion.  Plan:  1. PA and lateral chest x-ray 2. IV Lasix 3. DC Foley 4. Phase I cardiac rehabilitation 5. Transfer to telemetry later today if ambulates without difficulty 6. Decision on staged LAD intervention will depend upon clinical course. Could be as early as  Monday or in several weeks.  [name] 12/09/2014, 11:08 AM

## 2014-12-09 NOTE — Progress Notes (Addendum)
CARDIAC REHAB PHASE I   PRE:  Rate/Rhythm: 92 SR c/ occasional PVC  BP:  Sitting: 119/75        SaO2: 99 2L, 96 RA  MODE:  Ambulation: 40 ft   POST:  Rate/Rhythm: 105 ST c/ occasional PVC  BP:  Sitting: 130/90         SaO2: 80% on 2L, 80% on  6L, 89-90% on venturi mask, 91-95% on NRB  Pt lying in bed, eager to ambulate. Pt sat on edge of bed, stated he felt weak, willing to ambulate with assistance. Pt ambulated 40 ft on 2L O2, IV, gait belt, foley catheter, assist x2 (RN followed with wheelchair), steady gait but weak with very limited activity tolerance. Pt c/o sudden onset shortness of breath, sat abruptly in wheelchair, pale, c/o lightheadedness. Pt sats 98% on 2L O2 in hallway, seated in wheelchair. RR high. Pt instructed in pursed lip breathing, pt states with some difficulty, "I can't catch my breath."  Pt returned to room in wheelchair, sats dropped to 80% on 2L O2. Sats 80% on 6L O2. Pt pale, RR 35-40, HR low 100s, BP 130/90. RN placed pt on venti mask, sats 89-90%. Pt placed on NRB, sats improved to 91-95%. Significant other at bedside, aware of pt condition. Pt assisted to recliner, feet elevated, RN at bedside, states she will notify MD. Will hold education until pt status improves. Will follow.  [id number]     Lenna Sciara, RN, BSN 12/09/2014 2:36 PM

## 2014-12-09 NOTE — Progress Notes (Signed)
ANTICOAGULATION CONSULT NOTE - Follow Up Consult  Pharmacy Consult for heparin  Indication: PE  No Known Allergies  Patient Measurements: Height: 5\' 8"  (172.7 cm) Weight: 221 lb 9 oz (100.5 kg) IBW/kg (Calculated) : 68.4 Heparin Dosing Weight: 90kg  Vital Signs: Temp: 99.1 F (37.3 C) (12/08 1954) Temp Source: Oral (12/08 1954) BP: 114/83 mmHg (12/08 2100) Pulse Rate: 105 (12/08 2100)  Labs:  Recent Labs  12/07/14 0750 12/07/14 1340 12/07/14 1500 12/07/14 1910 12/08/14 0113 12/09/14 0312 12/09/14 1300 12/09/14 2210  HGB 13.3  --   --   --  13.1 11.9*  --   --   HCT 40.5  --   --   --  40.0 37.5*  --   --   PLT 324 342  --   --  313 253  --   --   APTT 158*  --   --   --   --   --   --   --   LABPROT 70.3* 16.9*  --   --   --   --   --   --   INR 9.06* 1.36  --   --   --   --   --   --   HEPARINUNFRC  --   --  0.34  --  0.33  --   --  0.18*  CREATININE 1.31* 1.12  --   --  0.98  --  0.93  --   TROPONINI 32.08* >65.00*  --  >65.00* >65.00*  --   --   --     Estimated Creatinine Clearance: 86.1 mL/min (by C-G formula based on Cr of 0.93).   Medications:  Scheduled:  . antiseptic oral rinse  7 mL Mouth Rinse BID  . aspirin  81 mg Oral Daily  . atorvastatin  80 mg Oral q1800  . insulin aspart  0-15 Units Subcutaneous TID WC  . metoprolol tartrate  12.5 mg Oral BID  . pantoprazole  40 mg Oral Q1200  . pneumococcal 23 valent vaccine  0.5 mL Intramuscular Tomorrow-1000  . sodium chloride  3 mL Intravenous Q12H  . sodium chloride  3 mL Intravenous Q12H  . ticagrelor  90 mg Oral Q12H   Infusions:  . heparin 1,100 Units/hr (12/09/14 1800)    Assessment: 69 yo male s/p STEMI with PCI on 12/6 and IABP post cath (also with VF arrest post-cath). He was on on heparin and tirofiban post cath and these infusions were discontinued after IABP removal on 12/7. He is now noted with SOB and pharmacy has been consulted to dose heparin for PE.  -last heparin rate was 1000  units/hr and heparin level was at goal (HL= 0.33) -Hg= 11.9 (down from 13.1; may be dilutional), plt = 253  Goal of Therapy:  Heparin level 0.3-0.7 units/ml Monitor platelets by anticoagulation protocol: Yes   Plan:  -Heparin bolus 3000 units IV followed by 1100 units/hr  -Heparin level in 6 hours and daily wth CBC daily  Hildred Laser, Pharm D 12/09/2014 10:38 PM   Addendum -HL low, bolus 2500 units, increase rate to 1350 units/hr -Check level with AM labs -Monitor s/sx bleeding  Harvel Quale  12/09/2014 .10:41 PM

## 2014-12-09 NOTE — Progress Notes (Signed)
   Developed sudden dyspnea when ambulating   by rehab.  CXR with CHF  Recent instrumentation and off anticoagulation due to sheath pull, hematuria, and groin oozing.  Plan: IV Lasix; IV heparin, LE venous doppler, and consider CT pulm angio if he does not get dramatically better with diuresis.

## 2014-12-09 NOTE — Progress Notes (Signed)
ANTICOAGULATION CONSULT NOTE - Follow Up Consult  Pharmacy Consult for heparin  Indication: PE  No Known Allergies  Patient Measurements: Height: 5\' 8"  (172.7 cm) Weight: 221 lb 9 oz (100.5 kg) IBW/kg (Calculated) : 68.4 Heparin Dosing Weight: 90kg  Vital Signs: Temp: 98.2 F (36.8 C) (12/08 1229) Temp Source: Oral (12/08 1229) BP: 113/77 mmHg (12/08 1200) Pulse Rate: 85 (12/08 1300)  Labs:  Recent Labs  12/07/14 0750 12/07/14 1340 12/07/14 1500 12/07/14 1910 12/08/14 0113 12/09/14 0312 12/09/14 1300  HGB 13.3  --   --   --  13.1 11.9*  --   HCT 40.5  --   --   --  40.0 37.5*  --   PLT 324 342  --   --  313 253  --   APTT 158*  --   --   --   --   --   --   LABPROT 70.3* 16.9*  --   --   --   --   --   INR 9.06* 1.36  --   --   --   --   --   HEPARINUNFRC  --   --  0.34  --  0.33  --   --   CREATININE 1.31* 1.12  --   --  0.98  --  0.93  TROPONINI 32.08* >65.00*  --  >65.00* >65.00*  --   --     Estimated Creatinine Clearance: 86.1 mL/min (by C-G formula based on Cr of 0.93).   Medications:  Scheduled:  . antiseptic oral rinse  7 mL Mouth Rinse BID  . aspirin  81 mg Oral Daily  . atorvastatin  80 mg Oral q1800  . furosemide  40 mg Intravenous Once  . insulin aspart  0-15 Units Subcutaneous TID WC  . metoprolol tartrate  12.5 mg Oral BID  . pantoprazole  40 mg Oral Q1200  . pneumococcal 23 valent vaccine  0.5 mL Intramuscular Tomorrow-1000  . sodium chloride  3 mL Intravenous Q12H  . sodium chloride  3 mL Intravenous Q12H  . ticagrelor  90 mg Oral Q12H   Infusions:    Assessment: 69 yo male s/p STEMI with PCI on 12/6 and IABP post cath (also with VF arrest post-cath). He was on on heparin and tirofiban post cath and these infusions were discontinued after IABP removal on 12/7. He is now noted with SOB and pharmacy has been consulted to dose heparin for PE.  -last heparin rate was 1000 units/hr and heparin level was at goal (HL= 0.33) -Hg= 11.9 (down from  13.1; may be dilutional), plt = 253  Goal of Therapy:  Heparin level 0.3-0.7 units/ml Monitor platelets by anticoagulation protocol: Yes   Plan:  -Heparin bolus 3000 units IV followed by 1100 units/hr  -Heparin level in 6 hours and daily wth CBC daily  Hildred Laser, Pharm D 12/09/2014 3:34 PM

## 2014-12-10 ENCOUNTER — Inpatient Hospital Stay (HOSPITAL_COMMUNITY): Payer: 59

## 2014-12-10 ENCOUNTER — Telehealth: Payer: Self-pay | Admitting: Cardiology

## 2014-12-10 DIAGNOSIS — M7989 Other specified soft tissue disorders: Secondary | ICD-10-CM

## 2014-12-10 DIAGNOSIS — R0600 Dyspnea, unspecified: Secondary | ICD-10-CM

## 2014-12-10 LAB — CBC
HCT: 34 % — ABNORMAL LOW (ref 39.0–52.0)
Hemoglobin: 11.2 g/dL — ABNORMAL LOW (ref 13.0–17.0)
MCH: 30.2 pg (ref 26.0–34.0)
MCHC: 32.9 g/dL (ref 30.0–36.0)
MCV: 91.6 fL (ref 78.0–100.0)
PLATELETS: 227 10*3/uL (ref 150–400)
RBC: 3.71 MIL/uL — ABNORMAL LOW (ref 4.22–5.81)
RDW: 14.3 % (ref 11.5–15.5)
WBC: 12.6 10*3/uL — ABNORMAL HIGH (ref 4.0–10.5)

## 2014-12-10 LAB — GLUCOSE, CAPILLARY
GLUCOSE-CAPILLARY: 86 mg/dL (ref 65–99)
GLUCOSE-CAPILLARY: 77 mg/dL (ref 65–99)
Glucose-Capillary: 99 mg/dL (ref 65–99)
Glucose-Capillary: 85 mg/dL (ref 65–99)

## 2014-12-10 LAB — RENAL FUNCTION PANEL
Albumin: 3 g/dL — ABNORMAL LOW (ref 3.5–5.0)
Anion gap: 10 (ref 5–15)
BUN: 18 mg/dL (ref 6–20)
CHLORIDE: 100 mmol/L — AB (ref 101–111)
CO2: 29 mmol/L (ref 22–32)
Calcium: 8.7 mg/dL — ABNORMAL LOW (ref 8.9–10.3)
Creatinine, Ser: 1 mg/dL (ref 0.61–1.24)
GFR calc Af Amer: >60 mL/min (ref >60)
GLUCOSE: 143 mg/dL — AB (ref 65–99)
POTASSIUM: 3.6 mmol/L (ref 3.5–5.1)
Phosphorus: 2.7 mg/dL (ref 2.5–4.6)
Sodium: 139 mmol/L (ref 135–145)

## 2014-12-10 LAB — HEPARIN LEVEL (UNFRACTIONATED)
HEPARIN UNFRACTIONATED: 0.24 [IU]/mL — AB (ref 0.30–0.70)
Heparin Unfractionated: 0.59 IU/mL (ref 0.30–0.70)

## 2014-12-10 MED ORDER — HEPARIN BOLUS VIA INFUSION
2000.0000 [IU] | Freq: Once | INTRAVENOUS | Status: AC
  Administered 2014-12-10: 2000 [IU] via INTRAVENOUS
  Filled 2014-12-10: qty 2000

## 2014-12-10 MED ORDER — POLYETHYLENE GLYCOL 3350 17 G PO PACK
17.0000 g | PACK | Freq: Once | ORAL | Status: AC
  Administered 2014-12-10: 17 g via ORAL
  Filled 2014-12-10: qty 1

## 2014-12-10 MED ORDER — POTASSIUM CHLORIDE 10 MEQ/50ML IV SOLN: 10.0000 meq | INTRAVENOUS | Status: DC

## 2014-12-10 MED ORDER — FUROSEMIDE 10 MG/ML IJ SOLN
40.0000 mg | Freq: Two times a day (BID) | INTRAMUSCULAR | Status: AC
  Administered 2014-12-10 (×2): 40 mg via INTRAVENOUS
  Filled 2014-12-10 (×2): qty 4

## 2014-12-10 NOTE — Progress Notes (Addendum)
       Patient Name: Ryan Ward Date of Encounter: 12/10/2014    SUBJECTIVE: Still has dyspnea if he moves around too vigorously. He denies chest pain.  TELEMETRY:  Heart rate is 88 bpm. O2 saturation 100% on 2 L cannula Filed Vitals:   12/10/14 0500 12/10/14 0600 12/10/14 0700 12/10/14 0800  BP: 110/79 109/78 106/80 115/69  Pulse: 88 92 77 85  Temp:    98 F (36.7 C)  TempSrc:    Oral  Resp: 25 29 20 18   Height:      Weight:  220 lb 10.9 oz (100.1 kg)    SpO2: 94% 95% 97% 100%    Intake/Output Summary (Last 24 hours) at 12/10/14 0941 Last data filed at 12/10/14 0800  Gross per 24 hour  Intake   1102 ml  Output   3290 ml  Net  -2188 ml   LABS: Basic Metabolic Panel:  Recent Labs  12/08/14 0113 12/09/14 1300  NA 139 140  K 4.4 4.0  CL 108 106  CO2 24 27  GLUCOSE 144* 142*  BUN 18 19  CREATININE 0.98 0.93  CALCIUM 8.4* 8.5*  PHOS  --  2.0*   CBC:  Recent Labs  12/09/14 0312 12/10/14 0350  WBC 13.7* 12.6*  HGB 11.9* 11.2*  HCT 37.5* 34.0*  MCV 92.6 91.6  PLT 253 227   Cardiac Enzymes:  Recent Labs  12/07/14 1340 12/07/14 1910 12/08/14 0113  TROPONINI >65.00* >65.00* >65.00*   Fasting Lipid Panel:  Recent Labs  12/08/14 0113  CHOL 148  HDL 33*  LDLCALC 96  TRIG 94  CHOLHDL 4.5    Radiology/Studies:  Chest x-ray 12/09/14 IMPRESSION: 1. Cardiomegaly and changes of mild edema. 2. More confluent opacity at the bases is favored to represent atelectasis or infiltrates Physical Exam: Blood pressure 115/69, pulse 85, temperature 98 F (36.7 C), temperature source Oral, resp. rate 18, height 5\' 8"  (1.727 m), weight 220 lb 10.9 oz (100.1 kg), SpO2 100 %. Weight change: -14.1 oz (-0.4 kg)  Wt Readings from Last 3 Encounters:  12/10/14 220 lb 10.9 oz (100.1 kg)   Mild neck vein distention Decreased breath sounds at both bases with mid lung field rales Cardiac exam reveals no rub. S4 gallop is audible. No peripheral edema Femoral cath  site is without evidence of hematoma ASSESSMENT:  1. Large inferior myocardial infarction complicated by cardiogenic shock and microcirculatory embolization at the time of PCI. 2. Ventricular fibrillation, resolved and has not recurred 3. Acute combined systolic and diastolic heart failure, with improvement on diuresis. Dyspnea however continues this morning. Further diuresis is planned. If dyspnea continues, will need CT angioma rule out pulmonary embolism. 4. Residual complex LAD disease which will require PCI. He'll likely be done after he fully recovers from this event. Therefore, plan is medical therapy, discharge home, follow-up with Dr. Martinique, and staged PCI in several weeks.  Plan:  1. Diuresis: will give Lasix 40 mg IV BID today. May need more tomorrow to resolve CHF. 2. If persistent dyspnea despite adequate diuresis, consider PE which will need to be excluded by performing pulmonary CT angiography 3. IV heparin was resumed yesterday after severe dyspnea developed on the outside possibility that dyspnea was PE related. If dyspnea resolves with diuresis, heparin can be discontinued. 4. Attempt ambulation later today. 5. Not yet ready for transfer  Ryan Ward 12/10/2014, 9:41 AM

## 2014-12-10 NOTE — Telephone Encounter (Signed)
New Message  Pt is in the hospital. Has jury duty on 12/14/2014. Requests to have a letter drawn up and ready for pick up stating that the pt is incapable of serving for jury duty. Please assist

## 2014-12-10 NOTE — Progress Notes (Signed)
Pt transferred to 3w27 temp 97.9, HR 85,  B/P121/69, o2 sat 92% on room air.

## 2014-12-10 NOTE — Progress Notes (Signed)
ANTICOAGULATION CONSULT NOTE - Follow Up Consult  Pharmacy Consult for heparin  Indication: PE  No Known Allergies  Patient Measurements: Height: 5\' 8"  (172.7 cm) Weight: 220 lb 10.9 oz (100.1 kg) IBW/kg (Calculated) : 68.4 Heparin Dosing Weight: 90kg  Vital Signs: Temp: 98 F (36.7 C) (12/09 1226) Temp Source: Oral (12/09 1226) BP: 109/73 mmHg (12/09 1400) Pulse Rate: 90 (12/09 1400)  Labs:  Recent Labs  12/07/14 1910  12/08/14 0113 12/09/14 0312 12/09/14 1300 12/09/14 2210 12/10/14 0350 12/10/14 1434  HGB  --   < > 13.1 11.9*  --   --  11.2*  --   HCT  --   --  40.0 37.5*  --   --  34.0*  --   PLT  --   --  313 253  --   --  227  --   HEPARINUNFRC  --   < > 0.33  --   --  0.18* 0.24* 0.59  CREATININE  --   --  0.98  --  0.93  --   --  1.00  TROPONINI >65.00*  --  >65.00*  --   --   --   --   --   < > = values in this interval not displayed.  Estimated Creatinine Clearance: 80 mL/min (by C-G formula based on Cr of 1).   Medications:  Scheduled:  . antiseptic oral rinse  7 mL Mouth Rinse BID  . aspirin  81 mg Oral Daily  . atorvastatin  80 mg Oral q1800  . furosemide  40 mg Intravenous BID  . insulin aspart  0-15 Units Subcutaneous TID WC  . metoprolol tartrate  12.5 mg Oral BID  . pantoprazole  40 mg Oral Q1200  . pneumococcal 23 valent vaccine  0.5 mL Intramuscular Tomorrow-1000  . sodium chloride  3 mL Intravenous Q12H  . sodium chloride  3 mL Intravenous Q12H  . ticagrelor  90 mg Oral Q12H   Infusions:  . heparin 1,500 Units/hr (12/10/14 1400)    Assessment: 69 yo male s/p STEMI with PCI on 12/6 and IABP post cath (also with VF arrest post-cath). He was on on heparin and tirofiban post cath and these infusions were discontinued after IABP removal on 12/7. He is now noted with DVT and for r/o PE and heparin has been restarted. -doppler on 12/9: Positive for acute deep vein thrombosis involving the right posterior tibial and peroneal veins. -HL=  0.59  Goal of Therapy:  Heparin level 0.3-0.7 units/ml Monitor platelets by anticoagulation protocol: Yes   Plan:  -No heparin changes needed -Daily heparin level and CBC  Hildred Laser, Pharm D 12/10/2014 3:25 PM

## 2014-12-10 NOTE — Progress Notes (Signed)
ANTICOAGULATION CONSULT NOTE - Follow Up Consult  Pharmacy Consult for Heparin  Indication: Possible PE  No Known Allergies  Patient Measurements: Height: 5\' 8"  (172.7 cm) Weight: 221 lb 9 oz (100.5 kg) IBW/kg (Calculated) : 68.4 Vital Signs: Temp: 98.9 F (37.2 C) (12/09 0353) Temp Source: Oral (12/09 0353) BP: 110/79 mmHg (12/09 0500) Pulse Rate: 88 (12/09 0500)  Labs:  Recent Labs  12/07/14 0750 12/07/14 1340  12/07/14 1910 12/08/14 0113 12/09/14 0312 12/09/14 1300 12/09/14 2210 12/10/14 0350  HGB 13.3  --   --   --  13.1 11.9*  --   --  11.2*  HCT 40.5  --   --   --  40.0 37.5*  --   --  34.0*  PLT 324 342  --   --  313 253  --   --  227  APTT 158*  --   --   --   --   --   --   --   --   LABPROT 70.3* 16.9*  --   --   --   --   --   --   --   INR 9.06* 1.36  --   --   --   --   --   --   --   HEPARINUNFRC  --   --   < >  --  0.33  --   --  0.18* 0.24*  CREATININE 1.31* 1.12  --   --  0.98  --  0.93  --   --   TROPONINI 32.08* >65.00*  --  >65.00* >65.00*  --   --   --   --   < > = values in this interval not displayed.  Estimated Creatinine Clearance: 86.1 mL/min (by C-G formula based on Cr of 0.93).   Assessment: Recent IABP (now out), on heparin for rule out PE after sudden dyspnea when ambulating, no issues per RN.   Goal of Therapy:  Heparin level 0.3-0.7 units/ml Monitor platelets by anticoagulation protocol: Yes   Plan:  -Heparin 2000 units BOLUS -Increase heparin drip to 1500 units/hr -1400 HL -Daily CBC/HL -Monitor for bleeding  Ryan Ward 12/10/2014,6:26 AM

## 2014-12-10 NOTE — Clinical Social Work Note (Signed)
CSW received consult for court note.  [place] has been made aware (12/08/2014).  CSW faxed said letter.  Family would like a copy of this letter.  This letter has been placed on the patient's chart.  Nonnie Done, LCSW [phone]  [place] [id number]  Licensed Clinical Social Worker

## 2014-12-10 NOTE — Telephone Encounter (Signed)
Returned call to patient.Spoke to daughter she stated father in hospital with recent heart attack.Stated he needed a letter to excuse him from jury duty.Advised letter will be left at [facility] office front desk.

## 2014-12-10 NOTE — Progress Notes (Signed)
Noted DVT and possible plan for CT for PE. Will hold ambulation today. Will f/u tomorrow. Yves Dill CES, ACSM 1:22 PM 12/10/2014

## 2014-12-10 NOTE — Progress Notes (Signed)
Preliminary results by tech - Venous Duplex Lower Ext. Completed. Positive for acute deep vein thrombosis involving the right posterior tibial and peroneal veins. No evidence of deep or superficial vein thrombosis in the left leg. Results given to patient's nurse. Oda Cogan, BS, RDMS, RVT

## 2014-12-11 LAB — RENAL FUNCTION PANEL
Albumin: 2.8 g/dL — ABNORMAL LOW (ref 3.5–5.0)
Anion gap: 10 (ref 5–15)
BUN: 19 mg/dL (ref 6–20)
CALCIUM: 8.6 mg/dL — AB (ref 8.9–10.3)
CO2: 29 mmol/L (ref 22–32)
CREATININE: 1.02 mg/dL (ref 0.61–1.24)
Chloride: 101 mmol/L (ref 101–111)
GFR calc Af Amer: >60 mL/min (ref >60)
GFR calc non Af Amer: >60 mL/min (ref >60)
GLUCOSE: 111 mg/dL — AB (ref 65–99)
Phosphorus: 3.3 mg/dL (ref 2.5–4.6)
Potassium: 3.2 mmol/L — ABNORMAL LOW (ref 3.5–5.1)
SODIUM: 140 mmol/L (ref 135–145)

## 2014-12-11 LAB — GLUCOSE, CAPILLARY
GLUCOSE-CAPILLARY: 89 mg/dL (ref 65–99)
GLUCOSE-CAPILLARY: 99 mg/dL (ref 65–99)
Glucose-Capillary: 93 mg/dL (ref 65–99)
Glucose-Capillary: 117 mg/dL — ABNORMAL HIGH (ref 65–99)

## 2014-12-11 LAB — CBC
HCT: 36.4 % — ABNORMAL LOW (ref 39.0–52.0)
HEMOGLOBIN: 12.4 g/dL — AB (ref 13.0–17.0)
MCH: 30.5 pg (ref 26.0–34.0)
MCHC: 34.1 g/dL (ref 30.0–36.0)
MCV: 89.4 fL (ref 78.0–100.0)
PLATELETS: 267 10*3/uL (ref 150–400)
RBC: 4.07 MIL/uL — ABNORMAL LOW (ref 4.22–5.81)
RDW: 14 % (ref 11.5–15.5)
WBC: 13 10*3/uL — ABNORMAL HIGH (ref 4.0–10.5)

## 2014-12-11 LAB — HEPARIN LEVEL (UNFRACTIONATED): HEPARIN UNFRACTIONATED: 0.35 [IU]/mL (ref 0.30–0.70)

## 2014-12-11 MED ORDER — FUROSEMIDE 10 MG/ML IJ SOLN
40.0000 mg | Freq: Two times a day (BID) | INTRAMUSCULAR | Status: DC
  Administered 2014-12-11 – 2014-12-14 (×7): 40 mg via INTRAVENOUS
  Filled 2014-12-11 (×7): qty 4

## 2014-12-11 MED ORDER — RIVAROXABAN 15 MG PO TABS
15.0000 mg | ORAL_TABLET | Freq: Two times a day (BID) | ORAL | Status: DC
  Administered 2014-12-11 – 2014-12-15 (×8): 15 mg via ORAL
  Filled 2014-12-11 (×8): qty 1

## 2014-12-11 MED ORDER — POTASSIUM CHLORIDE CRYS ER 20 MEQ PO TBCR
40.0000 meq | EXTENDED_RELEASE_TABLET | Freq: Two times a day (BID) | ORAL | Status: DC
  Administered 2014-12-11 – 2014-12-14 (×8): 40 meq via ORAL
  Filled 2014-12-11 (×8): qty 2

## 2014-12-11 MED ORDER — RIVAROXABAN 20 MG PO TABS: 20.0000 mg | ORAL_TABLET | Freq: Every day | ORAL | Status: DC

## 2014-12-11 MED FILL — Medication: Qty: 1 | Status: AC

## 2014-12-11 NOTE — Progress Notes (Addendum)
ANTICOAGULATION CONSULT NOTE - Follow Up Consult  Pharmacy Consult for Heparin -> Xarelto  Indication: Right Leg DVT   No Known Allergies  Patient Measurements: Height: 5\' 8"  (172.7 cm) Weight: 207 lb 11.2 oz (94.212 kg) IBW/kg (Calculated) : 68.4 Heparin Dosing Weight: 90 kg  Vital Signs: Temp: 98.1 F (36.7 C) (12/10 0613) Temp Source: Oral (12/10 [id number]) BP: 109/63 mmHg (12/10 0613)  Labs:  Recent Labs  12/09/14 0312 12/09/14 1300  12/10/14 0350 12/10/14 1434 12/11/14 0308  HGB 11.9*  --   --  11.2*  --  12.4*  HCT 37.5*  --   --  34.0*  --  36.4*  PLT 253  --   --  227  --  267  HEPARINUNFRC  --   --   < > 0.24* 0.59 0.35  CREATININE  --  0.93  --   --  1.00 1.02  < > = values in this interval not displayed.  Estimated Creatinine Clearance: 76.1 mL/min (by C-G formula based on Cr of 1.02).   Medications:  Scheduled:  . antiseptic oral rinse  7 mL Mouth Rinse BID  . aspirin  81 mg Oral Daily  . atorvastatin  80 mg Oral q1800  . insulin aspart  0-15 Units Subcutaneous TID WC  . metoprolol tartrate  12.5 mg Oral BID  . pantoprazole  40 mg Oral Q1200  . pneumococcal 23 valent vaccine  0.5 mL Intramuscular Tomorrow-1000  . ticagrelor  90 mg Oral Q12H   Infusions:  . heparin 1,500 Units/hr (12/10/14 1900)   PRN: acetaminophen, nitroGLYCERIN, ondansetron (ZOFRAN) IV  Assessment: 69 yo male s/p STEMI with PCI on 12/6 and IABP post cath (also with VF arrest post-cath). He was on on heparin and tirofiban post cath and these infusions were discontinued after IABP removal on 12/7. He is now noted with DVT and for r/o PE and heparin has been restarted. -doppler on 12/9: Positive for acute deep vein thrombosis involving the right posterior tibial and peroneal veins. -HL= 0.59>0.35, remains therapeutic.  Hgb 12.4 stable, plts stable 267 Per Nurse note: pink urine output and slight bleeding from penis 0200 12/10. Per RN the bleeding was most likely due to trauma from  urinary catheter and pt would like catheter removed. No other s/sx of bleeding noted.   MD has requested transition from IV Heparin to Xarelto 15 mg BID x 21 days then 20 mg daily   Goal of Therapy:  Heparin level 0.3-0.7 units/ml Monitor platelets by anticoagulation protocol: Yes   Plan:  -D/C heparin  -Xarelto 15 mg BID x 21 days then 20 mg daily  -Monitor for s/sx of bleeding   Bennye Alm, PharmD Pharmacy Resident 12/11/2014,8:49 AM

## 2014-12-11 NOTE — Progress Notes (Addendum)
Patient Name: Ryan Ward Date of Encounter: 12/11/2014    SUBJECTIVE:  Patient is a 69 year old gentleman with a history of hypertension, hyperlipidemia, obstructive sleep apnea who presented on December 6 with an ST segment elevation myocardial infarction.  He had an urgent cardiac catheterization on December 6. He was found have a 85% mid LAD   stenosis, 95% distal LAD  stenosis. The ostial circumflex had an 80% stenosis. The right coronary artery had a 30% mid lesion and a 40% distal lesion.  The distal RCA was occluded.  A 4.0 x 29 mm Promus stent was to point to the distal RCA and was inflated up to 16 atm. He had TIMI 3 flow following the stent procedure.  He had moderate LV dysfunction with an estimated ejection fraction of 35-40%    Still has dyspnea if he moves around too vigorously. He denies chest pain.  TELEMETRY:  Heart rate is 88 bpm. O2 saturation 100% on 2 L cannula Filed Vitals:   12/10/14 1700 12/10/14 1800 12/10/14 2034 12/11/14 0613  BP: 110/73 126/85 121/69 109/63  Pulse: 89 92    Temp:   97.9 F (36.6 C) 98.1 F (36.7 C)  TempSrc:   Oral Oral  Resp: 28 32 22 20  Height:   _0  (1.727 m)   Weight:   210 lb (95.255 kg) 207 lb 11.2 oz (94.212 kg)  SpO2: 100% 98% 94% 96%    Intake/Output Summary (Last 24 hours) at 12/11/14 1017 Last data filed at 12/11/14 0827  Gross per 24 hour  Intake    806 ml  Output   2125 ml  Net  -1319 ml   LABS: Basic Metabolic Panel:  Recent Labs  12/10/14 1434 12/11/14 0308  NA 139 140  K 3.6 3.2*  CL 100* 101  CO2 29 29  GLUCOSE 143* 111*  BUN 18 19  CREATININE 1.00 1.02  CALCIUM 8.7* 8.6*  PHOS 2.7 3.3   CBC:  Recent Labs  12/10/14 0350 12/11/14 0308  WBC 12.6* 13.0*  HGB 11.2* 12.4*  HCT 34.0* 36.4*  MCV 91.6 89.4  PLT 227 267   Cardiac Enzymes: No results for input(s): CKTOTAL, CKMB, CKMBINDEX, TROPONINI in the last 72 hours. Fasting Lipid Panel: No results for input(s): CHOL, HDL,  LDLCALC, TRIG, CHOLHDL, LDLDIRECT in the last 72 hours.  Radiology/Studies:  Chest x-ray 12/09/14 IMPRESSION: 1. Cardiomegaly and changes of mild edema. 2. More confluent opacity at the bases is favored to represent atelectasis or infiltrates Physical Exam: Blood pressure 109/63, pulse 92, temperature 98.1 F (36.7 C), temperature source Oral, resp. rate 20, height _1  (1.727 m), weight 207 lb 11.2 oz (94.212 kg), SpO2 96 %. Weight change: -10 lb 10.9 oz (-4.845 kg)  Wt Readings from Last 3 Encounters:  12/11/14 207 lb 11.2 oz (94.212 kg)   Mild neck vein distention Lungs :  Basilar rales.  Cardiac exam reveals no rub. S4 gallop is audible. No peripheral edema Femoral cath site is without evidence of hematoma ASSESSMENT:  1. Large inferior myocardial infarction complicated by cardiogenic shock and microcirculatory embolization at the time of PCI. 2. Ventricular fibrillation, resolved and has not recurred 3. Acute combined systolic and diastolic heart failure, with improvement on diuresis. Dyspnea however continues this morning. Further diuresis is planned. If dyspnea continues, will need CT angioma rule out pulmonary embolism. 4. Residual complex LAD disease which will require PCI. He'll likely be done after he fully recovers from this  event. Therefore, plan is medical therapy, discharge home, follow-up with Dr. Martinique, and staged PCI in several weeks.  Plan:  1. Diuresis: will continue  Lasix 40 mg IV BID today.  2. If persistent dyspnea despite adequate diuresis, consider PE which will need to be excluded by performing pulmonary CT angiography.  He still has basilar rales.  3.Right leg DVT:   Is currently on heparin . Will change to Xarelto 15 BID for 21 days , then 20 mg a day    Vela Prose 12/11/2014, 10:17 AM

## 2014-12-11 NOTE — Progress Notes (Signed)
Patient concerned about pink urine output and slight bleeding from penis. MD notified and no new orders. Will continue to monitor.

## 2014-12-12 ENCOUNTER — Inpatient Hospital Stay (HOSPITAL_COMMUNITY): Payer: 59

## 2014-12-12 DIAGNOSIS — I480 Paroxysmal atrial fibrillation: Secondary | ICD-10-CM

## 2014-12-12 LAB — CBC
HEMATOCRIT: 38.2 % — AB (ref 39.0–52.0)
HEMOGLOBIN: 12.9 g/dL — AB (ref 13.0–17.0)
MCH: 29.9 pg (ref 26.0–34.0)
MCHC: 33.8 g/dL (ref 30.0–36.0)
MCV: 88.6 fL (ref 78.0–100.0)
Platelets: 272 10*3/uL (ref 150–400)
RBC: 4.31 MIL/uL (ref 4.22–5.81)
RDW: 13.6 % (ref 11.5–15.5)
WBC: 11.7 10*3/uL — ABNORMAL HIGH (ref 4.0–10.5)

## 2014-12-12 LAB — GLUCOSE, CAPILLARY
Glucose-Capillary: 98 mg/dL (ref 65–99)
Glucose-Capillary: 84 mg/dL (ref 65–99)
Glucose-Capillary: 109 mg/dL — ABNORMAL HIGH (ref 65–99)

## 2014-12-12 LAB — BASIC METABOLIC PANEL
ANION GAP: 8 (ref 5–15)
BUN: 20 mg/dL (ref 6–20)
CHLORIDE: 100 mmol/L — AB (ref 101–111)
CO2: 26 mmol/L (ref 22–32)
Calcium: 8.7 mg/dL — ABNORMAL LOW (ref 8.9–10.3)
Creatinine, Ser: 1.09 mg/dL (ref 0.61–1.24)
GFR calc Af Amer: >60 mL/min (ref >60)
GLUCOSE: 97 mg/dL (ref 65–99)
POTASSIUM: 3.9 mmol/L (ref 3.5–5.1)
Sodium: 134 mmol/L — ABNORMAL LOW (ref 135–145)

## 2014-12-12 LAB — HEPARIN LEVEL (UNFRACTIONATED): Heparin Unfractionated: >2.2 IU/mL — ABNORMAL HIGH (ref 0.30–0.70)

## 2014-12-12 MED ORDER — CLOPIDOGREL BISULFATE 75 MG PO TABS
75.0000 mg | ORAL_TABLET | Freq: Every day | ORAL | Status: DC
  Administered 2014-12-13 – 2014-12-15 (×3): 75 mg via ORAL
  Filled 2014-12-12 (×3): qty 1

## 2014-12-12 MED ORDER — DILTIAZEM HCL 100 MG IV SOLR
5.0000 mg/h | INTRAVENOUS | Status: DC
  Administered 2014-12-12: 12.5 mg/h via INTRAVENOUS
  Administered 2014-12-12: 5 mg/h via INTRAVENOUS
  Administered 2014-12-12: 10 mg/h via INTRAVENOUS
  Filled 2014-12-12 (×3): qty 100

## 2014-12-12 MED ORDER — DILTIAZEM HCL 25 MG/5ML IV SOLN: 10.0000 mg | Freq: Once | INTRAVENOUS | Status: DC

## 2014-12-12 MED ORDER — DILTIAZEM LOAD VIA INFUSION
10.0000 mg | Freq: Once | INTRAVENOUS | Status: AC
  Administered 2014-12-12: 10 mg via INTRAVENOUS
  Filled 2014-12-12: qty 10

## 2014-12-12 MED ORDER — METOPROLOL TARTRATE 25 MG PO TABS
25.0000 mg | ORAL_TABLET | Freq: Two times a day (BID) | ORAL | Status: DC
  Administered 2014-12-12 – 2014-12-14 (×4): 25 mg via ORAL
  Filled 2014-12-12 (×4): qty 1

## 2014-12-12 MED ORDER — CLOPIDOGREL BISULFATE 75 MG PO TABS
150.0000 mg | ORAL_TABLET | Freq: Once | ORAL | Status: AC
  Administered 2014-12-12: 150 mg via ORAL
  Filled 2014-12-12: qty 2

## 2014-12-12 MED ORDER — IOHEXOL 350 MG/ML SOLN
80.0000 mL | Freq: Once | INTRAVENOUS | Status: AC | PRN
  Administered 2014-12-12: 80 mL via INTRAVENOUS

## 2014-12-12 NOTE — Progress Notes (Addendum)
Patient Name: Ryan Ward Date of Encounter: 12/12/2014    SUBJECTIVE:  Patient is a 69 year old gentleman with a history of hypertension, hyperlipidemia, obstructive sleep apnea who presented on December 6 with an ST segment elevation myocardial infarction.  He had an urgent cardiac catheterization on December 6. He was found have a 85% mid LAD   stenosis, 95% distal LAD  stenosis. The ostial circumflex had an 80% stenosis. The right coronary artery had a 30% mid lesion and a 40% distal lesion.  The distal RCA was occluded.  A 4.0 x 29 mm Promus stent was to point to the distal RCA and was inflated up to 16 atm. He had TIMI 3 flow following the stent procedure.  He had moderate LV dysfunction with an estimated ejection fraction of 35-40%    Still has dyspnea if he moves around too vigorously. He denies chest pain.   Pt developed atrial fib with RVR this am. Was transitioned to xarelto yesterday      TELEMETRY:   A-fib with RVR 140s   Filed Vitals:   12/11/14 1650 12/11/14 2015 12/12/14 0111 12/12/14 0407  BP: 120/74 116/77 110/66 102/63  Pulse: 92 89 83 94  Temp: 98.7 F (37.1 C) 98.6 F (37 C) 99.1 F (37.3 C) 98.1 F (36.7 C)  TempSrc: Oral Oral Oral Oral  Resp: 18     Height:      Weight:    201 lb 8 oz (91.4 kg)  SpO2: 97% 98% 92% 96%    Intake/Output Summary (Last 24 hours) at 12/12/14 0832 Last data filed at 12/12/14 0520  Gross per 24 hour  Intake    936 ml  Output   4000 ml  Net  -3064 ml   LABS: Basic Metabolic Panel:  Recent Labs  12/10/14 1434 12/11/14 0308  NA 139 140  K 3.6 3.2*  CL 100* 101  CO2 29 29  GLUCOSE 143* 111*  BUN 18 19  CREATININE 1.00 1.02  CALCIUM 8.7* 8.6*  PHOS 2.7 3.3   CBC:  Recent Labs  12/11/14 0308 12/12/14 0339  WBC 13.0* 11.7*  HGB 12.4* 12.9*  HCT 36.4* 38.2*  MCV 89.4 88.6  PLT 267 272   Cardiac Enzymes: No results for input(s): CKTOTAL, CKMB, CKMBINDEX, TROPONINI in the last 72  hours. Fasting Lipid Panel: No results for input(s): CHOL, HDL, LDLCALC, TRIG, CHOLHDL, LDLDIRECT in the last 72 hours.  Radiology/Studies:  Chest x-ray 12/09/14 IMPRESSION: 1. Cardiomegaly and changes of mild edema. 2. More confluent opacity at the bases is favored to represent atelectasis or infiltrates Physical Exam: Blood pressure 102/63, pulse 94, temperature 98.1 F (36.7 C), temperature source Oral, resp. rate 18, height 5' 8"  (1.727 m), weight 201 lb 8 oz (91.4 kg), SpO2 96 %. Weight change: -8 lb 8 oz (-3.856 kg)  Wt Readings from Last 3 Encounters:  12/12/14 201 lb 8 oz (91.4 kg)   Mild neck vein distention Lungs :  Basilar rales.  Cardiac exam:  Irreg Irreg. Tachy  reveals no rub.   No peripheral edema Femoral cath site is without evidence of hematoma  Tele :  Personally reviewed.,  Atrial fib with RVR at 140    ASSESSMENT:  1. Large inferior myocardial infarction complicated by cardiogenic shock and microcirculatory embolization at the time of PCI.  , still having some heart failure symptoms  Continue diuresis Currently on Brilinta.  Will need to change to Plavix because we are starting Xarelto  Will load with plavix 150 mg tonight  followed by 75 a day    2. Ventricular fibrillation, resolved and has not recurred  3. Acute combined systolic and diastolic heart failure, with improvement on diuresis. Dyspnea however continues this morning. Has diuresed well overnight   4. Residual complex LAD disease which will require PCI. He'll likely be done after he fully recovers from this event. Therefore, plan is medical therapy, discharge home, follow-up with Dr. Martinique, and staged PCI in several weeks.  5. Atrial fib:   Will start dilt drip.  Increase metoprolol  Already on Xarelto 15 BID ( for DVT and ? Of pulmonary embolus )  Will get CT angio to look for PE as this may be the cause of his atrial fib.    Signed, Thayer Headings 12/12/2014, 8:32 AM

## 2014-12-12 NOTE — Progress Notes (Signed)
Pt. States he can place cpap on himself. RT informed pt. To notify if he needed any assistance. Pt. States he needed more pressure with his cpap. Pt. Now on 8 cmh20 with cpap. No issues at this time.

## 2014-12-12 NOTE — Progress Notes (Signed)
Back to room from CT. Cardizem 10 mg bolus given at this time. Cardizem gtt started at 10mg /hr.

## 2014-12-12 NOTE — Progress Notes (Signed)
Dr. Susy Manor made aware that pt has converted to NSR. VS provided. Informed that I had titrated Cardizem Drip down from 12.5mg  to 10mg  once he converted. Instructed to turn drip down to 5mg . Will continue to closely monitor cardiac rhythm.

## 2014-12-12 NOTE — Progress Notes (Signed)
HR ranging 100 to 110's in Afib. Cardizem gtt increased to 12.5 mg/hr per Left AC PIV.

## 2014-12-13 ENCOUNTER — Encounter (HOSPITAL_COMMUNITY): Payer: Self-pay | Admitting: Student

## 2014-12-13 DIAGNOSIS — I2699 Other pulmonary embolism without acute cor pulmonale: Secondary | ICD-10-CM

## 2014-12-13 LAB — GLUCOSE, CAPILLARY
GLUCOSE-CAPILLARY: 111 mg/dL — AB (ref 65–99)
GLUCOSE-CAPILLARY: 126 mg/dL — AB (ref 65–99)
Glucose-Capillary: 114 mg/dL — ABNORMAL HIGH (ref 65–99)
Glucose-Capillary: 83 mg/dL (ref 65–99)

## 2014-12-13 LAB — CBC
HCT: 40.7 % (ref 39.0–52.0)
HEMOGLOBIN: 13.4 g/dL (ref 13.0–17.0)
MCH: 29.6 pg (ref 26.0–34.0)
MCHC: 32.9 g/dL (ref 30.0–36.0)
MCV: 89.8 fL (ref 78.0–100.0)
Platelets: 301 10*3/uL (ref 150–400)
RBC: 4.53 MIL/uL (ref 4.22–5.81)
RDW: 13.9 % (ref 11.5–15.5)
WBC: 12.9 10*3/uL — ABNORMAL HIGH (ref 4.0–10.5)

## 2014-12-13 MED ORDER — RIVAROXABAN (XARELTO) EDUCATION KIT FOR DVT/PE PATIENTS
PACK | Freq: Once | Status: AC
  Administered 2014-12-13: 11:00:00

## 2014-12-13 MED ORDER — COLCHICINE 0.6 MG PO TABS
0.6000 mg | ORAL_TABLET | Freq: Every day | ORAL | Status: DC
  Administered 2014-12-13 – 2014-12-15 (×3): 0.6 mg via ORAL
  Filled 2014-12-13 (×3): qty 1

## 2014-12-13 NOTE — Procedures (Signed)
Pt placed on CPAP and is tolerating well.

## 2014-12-13 NOTE — Progress Notes (Addendum)
At 0947:  Patient heart rate increased to 140's briefly then returned to 70-80's.  [place] PA paged

## 2014-12-13 NOTE — Progress Notes (Signed)
Utilization review completed.  

## 2014-12-13 NOTE — Progress Notes (Signed)
Patient Name: Ryan Ward Date of Encounter: 12/13/2014  Principal Problem:   ST elevation (STEMI) myocardial infarction involving right coronary artery ([place]) Active Problems:   Complete heart block (HCC)   Cardiogenic shock (HCC)   HTN (hypertension)   Hematuria   Ventricular fibrillation (HCC)   CAD (coronary artery disease), native coronary artery   Acute combined systolic and diastolic heart failure [facility])    Primary Cardiologist: Dr. Martinique Patient Profile: 69 yo male w/ PMH of HTN, HLD, and OSA admitted on 12/07/2014 for STEMI. Cath showed 100% stenosis of RCA with successful PCI using DES. Also had 85-95% stenosis in the LAD and will need future staged PCI of the lesion. Developed atrial fibrillation w/ RVR on 12/12/2014. Converted to NSR overnight.  SUBJECTIVE: Reports significant improvement in his breathing. Denies any chest pain. Having right great toe pain, tender to touch.   OBJECTIVE Filed Vitals:   12/12/14 2222 12/12/14 2358 12/13/14 0445 12/13/14 1053  BP: 116/67 100/67 98/64 110/61  Pulse: 84 76 70 78  Temp:  98.5 F (36.9 C) 98.2 F (36.8 C)   TempSrc:  Oral Oral   Resp:  22    Height:      Weight:   199 lb 8.3 oz (90.5 kg)   SpO2:  96% 96%     Intake/Output Summary (Last 24 hours) at 12/13/14 1150 Last data filed at 12/13/14 1150  Gross per 24 hour  Intake 501.54 ml  Output   2425 ml  Net -1923.46 ml   Filed Weights   12/11/14 0613 12/12/14 0407 12/13/14 0445  Weight: 207 lb 11.2 oz (94.212 kg) 201 lb 8 oz (91.4 kg) 199 lb 8.3 oz (90.5 kg)    PHYSICAL EXAM General: Well developed, well nourished, male in no acute distress. Head: Normocephalic, atraumatic.  Neck: Supple without bruits, JVD not elevated. Lungs:  Resp regular and unlabored, Rales at bases bilaterally. Heart: RRR, S1, S2, no S3, S4, or murmur; no rub. Abdomen: Soft, non-tender, non-distended with normoactive bowel sounds. No hepatomegaly. No rebound/guarding. No obvious  abdominal masses. Extremities: No clubbing, cyanosis, or edema. Distal pedal pulses are 2+ bilaterally. Swollen right great toe which is tender to touch. No erythema. Neuro: Alert and oriented X 3. Moves all extremities spontaneously. Psych: Normal affect.  LABS: CBC: Recent Labs  12/12/14 0339 12/13/14 0515  WBC 11.7* 12.9*  HGB 12.9* 13.4  HCT 38.2* 40.7  MCV 88.6 89.8  PLT 272 301   INR:No results for input(s): INR in the last 72 hours. Basic Metabolic Panel: Recent Labs  12/10/14 1434 12/11/14 0308 12/12/14 1148  NA 139 140 134*  K 3.6 3.2* 3.9  CL 100* 101 100*  CO2 29 29 26   GLUCOSE 143* 111* 97  BUN 18 19 20   CREATININE 1.00 1.02 1.09  CALCIUM 8.7* 8.6* 8.7*  PHOS 2.7 3.3  --    Liver Function Tests: Recent Labs  12/10/14 1434 12/11/14 0308  ALBUMIN 3.0* 2.8*   BNP:  B NATRIURETIC PEPTIDE  Date/Time Value Ref Range Status  12/09/2014 01:00 PM 252.8* 0.0 - 100.0 pg/mL Final  12/07/2014 07:50 AM 45.0 0.0 - 100.0 pg/mL Final   TELE:   Atrial fibrillation with rate in 120's - 130's. Converted to NSR overnight and rates have been in 70's - 80's. Had episode of sinus tach this AM with rates in 130's (perhaps with ambulation).      ECHO: 12/07/2014 Study Conclusions  - Left ventricle: Infeior and posterior lateral  hypokinesis The cavity size was normal. Wall thickness was normal. Systolic function was normal. The estimated ejection fraction was in the range of 50% to 55%. - Aorta: Catheter seen in descending thoracic aorta ? Impella. - Atrial septum: No defect or patent foramen ovale was identified. - Impressions: ? pacing catheter seen in IVC.  Impressions: - ? pacing catheter seen in IVC.  Radiology/Studies: Ct Angio Chest Pe W/cm &/or Wo Cm: 12/12/2014  CLINICAL DATA:  Shortness of Breath EXAM: CT ANGIOGRAPHY CHEST WITH CONTRAST TECHNIQUE: Multidetector CT imaging of the chest was performed using the standard protocol during bolus  administration of intravenous contrast. Multiplanar CT image reconstructions and MIPs were obtained to evaluate the vascular anatomy. CONTRAST:  74mL OMNIPAQUE IOHEXOL 350 MG/ML SOLN COMPARISON:  Chest radiograph December 09, 2014 FINDINGS: There are small peripheral upper lobe pulmonary emboli bilaterally. There is a slightly larger pulmonary embolus extending into the anterior segment of the right upper lobe. No more central pulmonary emboli are identified. There is no demonstrable right heart strain. There is no demonstrable thoracic aortic aneurysm. The visualized great vessels appear unremarkable. There are bilateral pleural effusions with patchy consolidation in both lung bases. There is also interstitial edema in both lower lobes. There is mild atelectatic change in the periphery of both upper lobes. Visualized thyroid appears normal. There are scattered subcentimeter lymph nodes without adenopathy by size criteria. The pericardium is not thickened. There is calcification in the left anterior descending coronary artery. In the visualized upper abdomen, no focal lesions are identified beyond atherosclerotic calcification in the aorta. There are no blastic or lytic bone lesions. There is mild degenerative change in the thoracic spine. Review of the MIP images confirms the above findings. IMPRESSION: Peripheral upper lobe pulmonary emboli, largest present in the anterior segment right upper lobe pulmonary artery. No more central pulmonary emboli. No evidence of right heart strain. Findings indicative of a degree of congestive heart failure. Specifically there are bilateral pleural effusions with interstitial edema. Consolidation in the bases is in part due to atelectasis. There may be a degree of early pneumonia in the lung bases as well. No appreciable adenopathy. Critical Value/emergent results were called by telephone at the time of interpretation on 12/12/2014 at 10:10 am to Dr. Mertie Moores , who verbally  acknowledged these results. Electronically Signed   By: Lowella Grip III M.D.   On: 12/12/2014 10:10     Current Medications:  . antiseptic oral rinse  7 mL Mouth Rinse BID  . aspirin  81 mg Oral Daily  . atorvastatin  80 mg Oral q1800  . clopidogrel  75 mg Oral Daily  . furosemide  40 mg Intravenous Q12H  . insulin aspart  0-15 Units Subcutaneous TID WC  . metoprolol tartrate  25 mg Oral BID  . pantoprazole  40 mg Oral Q1200  . pneumococcal 23 valent vaccine  0.5 mL Intramuscular Tomorrow-1000  . potassium chloride  40 mEq Oral BID  . rivaroxaban   Does not apply Once  . Rivaroxaban  15 mg Oral BID WC   Followed by  . [START ON 01/01/2015] rivaroxaban  20 mg Oral Q supper   . diltiazem (CARDIZEM) infusion 5 mg/hr (12/13/14 0000)    ASSESSMENT AND PLAN:  1. Large inferior myocardial infarction complicated by cardiogenic shock and microcirculatory embolization at the time of PCI. - admitted on 12/07/2014 for STEMI. Cath showed 100% stenosis of RCA with successful PCI using DES. Also had 85-95% stenosis in the LAD and  will need future staged PCI of the lesion.  - being diuresed with 40mg  IV BID. Net output of -2.9L this admission. - continue ASA, statin, Plavix, and BB.  2. Ventricular fibrillation - resolved and has not recurred  3. Acute combined systolic and diastolic heart failure - EF 50-55% on echo (12/07/2014) - continue current diuresis with Lasix 40mg  IV BID. - continue BB. Consider ACE-I once diuresis with IV Lasix is complete (BP is soft at times in the 90's).  4. Residual complex LAD disease which will require PCI. - Will likely be done after he fully recovers from this event. Therefore, plan is medical therapy, discharge home, follow-up with Dr. Martinique, and staged PCI in several weeks.  5. Paroxysmal Atrial fib:  - converted to NSR overnight - continue Xarelto for anticoagulation - Discontinue Cardizem. Will use BB for rate control.  6. Pulmonary  Embolism - CTA on 12/12/2014 showed a peripheral upper lobe pulmonary emboli, largest present in the anterior segment right upper lobe pulmonary artery. No evidence of right heart strain. - Continue Xarelto  7. Swollen and Tender Right Great Toe - concerning for possible gout vs. Arthritis. Patient says it has severely worsened over the past 2 days. - will check Uric Acid level - start empiric Colchicine 0.6mg  daily.   Arna Medici , PA-C 11:50 AM 12/13/2014 Pager: [phone]   Attending Note:   The patient was seen and examined.  Agree with assessment and plan as noted above.  Changes made to the above note as needed.  1 . CAD :  i have reviewed cath .   He appears stable.  Has 2tight LAD stenosis. Would prefer that he get over this MI and also probably get a little further out from this pulmonary embolus before we hold Xarelto to attempt fur PCI.  2. DVT Pulmonary embolus:    Continue xarelto 15 BID   3.  Acute diastolic CHF:  contiue to diurese.  Continue IV lasix for now  4. Paroxysmal atrial fib- likely related to his PE.   5. Right great toe pain :  Agree with trying colchicine.    Thayer Headings, Brooke Bonito., MD, [facility] 12/13/2014, 1:06 PM [address],  [place] Pager [phone]

## 2014-12-13 NOTE — Progress Notes (Signed)
Insurance check completed for Xarelto benefits- Pt copay will be $50-prior auth not req - pt has been given 30 day free card per Pharmacy.

## 2014-12-13 NOTE — Progress Notes (Addendum)
CARDIAC REHAB PHASE I   PRE:  Rate/Rhythm: 86 SR    BP: sitting 111/68    SaO2: 96 RA  MODE:  Ambulation: 370 ft   POST:  Rate/Rhythm: 98 SR    BP: sitting 113/68     SaO2: 98 RA  Pt had not been out of bed all weekend. Able to walk independently with RW. Remained in SR, SAO2 98 RA (had been on 2L). Pt thankful, to chair. Began ed with pt and family. Very receptive. Interested in [place] and will send referral to [facility].  Will f/u. [phone]   Josephina Shih Ferron CES, ACSM 12/13/2014 10:58 AM

## 2014-12-13 NOTE — Discharge Instructions (Addendum)
Information on my medicine - XARELTO (rivaroxaban)  WHY WAS XARELTO PRESCRIBED FOR YOU? Xarelto was prescribed to treat blood clots that may have been found in the veins of your legs (deep vein thrombosis) or in your lungs (pulmonary embolism) and to reduce the risk of them occurring again.  What do you need to know about Xarelto? The starting dose is one 15 mg tablet taken TWICE daily with food for the FIRST 21 DAYS then on December 31  the dose is changed to one 20 mg tablet taken ONCE A DAY with your evening meal.  DO NOT stop taking Xarelto without talking to the health care provider who prescribed the medication.  Refill your prescription for 20 mg tablets before you run out.  After discharge, you should have regular check-up appointments with your healthcare provider that is prescribing your Xarelto.  In the future your dose may need to be changed if your kidney function changes by a significant amount.  What do you do if you miss a dose? If you are taking Xarelto TWICE DAILY and you miss a dose, take it as soon as you remember. You may take two 15 mg tablets (total 30 mg) at the same time then resume your regularly scheduled 15 mg twice daily the next day.  If you are taking Xarelto ONCE DAILY and you miss a dose, take it as soon as you remember on the same day then continue your regularly scheduled once daily regimen the next day. Do not take two doses of Xarelto at the same time.   Important Safety Information Xarelto is a blood thinner medicine that can cause bleeding. You should call your healthcare provider right away if you experience any of the following: ? Bleeding from an injury or your nose that does not stop. ? Unusual colored urine (red or dark brown) or unusual colored stools (red or black). ? Unusual bruising for unknown reasons. ? A serious fall or if you hit your head (even if there is no bleeding).  Some medicines may interact with Xarelto and might increase  your risk of bleeding while on Xarelto. To help avoid this, consult your healthcare provider or pharmacist prior to using any new prescription or non-prescription medications, including herbals, vitamins, non-steroidal anti-inflammatory drugs (NSAIDs) and supplements.  This website has more information on Xarelto: [email or web address]   Limit daily fluid intake to less than 2 Liters per day. Please limit salt intake.  Please weight yourself every morning. Call cardiology if weight increases by 3 pounds overnight or 5 pounds in a single week.   Take Xarelto 15 twice daily until Dec. 30. On Dec. 31, start taking Xarelto 20 mg once daily.

## 2014-12-14 DIAGNOSIS — M1000 Idiopathic gout, unspecified site: Secondary | ICD-10-CM

## 2014-12-14 LAB — BASIC METABOLIC PANEL
ANION GAP: 12 (ref 5–15)
BUN: 26 mg/dL — ABNORMAL HIGH (ref 6–20)
CALCIUM: 9 mg/dL (ref 8.9–10.3)
CO2: 26 mmol/L (ref 22–32)
Chloride: 100 mmol/L — ABNORMAL LOW (ref 101–111)
Creatinine, Ser: 1.2 mg/dL (ref 0.61–1.24)
GFR, EST NON AFRICAN AMERICAN: 60 mL/min — AB (ref >60)
Glucose, Bld: 106 mg/dL — ABNORMAL HIGH (ref 65–99)
Potassium: 4.2 mmol/L (ref 3.5–5.1)
Sodium: 138 mmol/L (ref 135–145)

## 2014-12-14 LAB — GLUCOSE, CAPILLARY
GLUCOSE-CAPILLARY: 85 mg/dL (ref 65–99)
Glucose-Capillary: 105 mg/dL — ABNORMAL HIGH (ref 65–99)
Glucose-Capillary: 115 mg/dL — ABNORMAL HIGH (ref 65–99)
Glucose-Capillary: 126 mg/dL — ABNORMAL HIGH (ref 65–99)

## 2014-12-14 LAB — URIC ACID: URIC ACID, SERUM: 5.9 mg/dL (ref 4.4–7.6)

## 2014-12-14 MED ORDER — METOPROLOL TARTRATE 25 MG PO TABS
25.0000 mg | ORAL_TABLET | Freq: Once | ORAL | Status: AC
  Administered 2014-12-14: 25 mg via ORAL
  Filled 2014-12-14: qty 1

## 2014-12-14 MED ORDER — GUAIFENESIN-DM 100-10 MG/5ML PO SYRP
10.0000 mL | ORAL_SOLUTION | ORAL | Status: DC | PRN
  Administered 2014-12-14: 10 mL via ORAL
  Filled 2014-12-14: qty 10

## 2014-12-14 MED ORDER — DILTIAZEM HCL 100 MG IV SOLR: 5.0000 mg/h | INTRAVENOUS | Status: DC

## 2014-12-14 MED ORDER — FUROSEMIDE 10 MG/ML IJ SOLN
40.0000 mg | Freq: Two times a day (BID) | INTRAMUSCULAR | Status: DC
  Administered 2014-12-14 – 2014-12-15 (×2): 40 mg via INTRAVENOUS
  Filled 2014-12-14 (×2): qty 4

## 2014-12-14 MED ORDER — METOPROLOL TARTRATE 50 MG PO TABS
50.0000 mg | ORAL_TABLET | Freq: Two times a day (BID) | ORAL | Status: DC
  Administered 2014-12-14 – 2014-12-15 (×2): 50 mg via ORAL
  Filled 2014-12-14 (×2): qty 1

## 2014-12-14 MED ORDER — DILTIAZEM LOAD VIA INFUSION
10.0000 mg | Freq: Once | INTRAVENOUS | Status: DC
  Filled 2014-12-14: qty 10

## 2014-12-14 MED ORDER — AMIODARONE HCL 200 MG PO TABS
400.0000 mg | ORAL_TABLET | Freq: Two times a day (BID) | ORAL | Status: DC
  Administered 2014-12-14 – 2014-12-15 (×3): 400 mg via ORAL
  Filled 2014-12-14 (×3): qty 2

## 2014-12-14 NOTE — Progress Notes (Signed)
Patient Name: Ryan Ward Date of Encounter: 12/14/2014  Principal Problem:   ST elevation (STEMI) myocardial infarction involving right coronary artery ([place]) Active Problems:   Complete heart block (HCC)   Cardiogenic shock (HCC)   HTN (hypertension)   Hematuria   Ventricular fibrillation (HCC)   CAD (coronary artery disease), native coronary artery   Acute combined systolic and diastolic heart failure [facility])    Primary Cardiologist: Dr. Martinique Patient Profile: 69 yo male w/ PMH of HTN, HLD, and OSA admitted on 12/07/2014 for STEMI. Cath showed 100% stenosis of RCA with successful PCI using DES. Also had 85-95% stenosis in the LAD and will need future staged PCI of the lesion. Developed atrial fibrillation w/ RVR on 12/12/2014 and converted that night.  SUBJECTIVE: Denies any chest pain, shortness of breath, or palpitations. Reports significant improvement in his right great toe pain.   OBJECTIVE Filed Vitals:   12/13/14 1337 12/13/14 2027 12/14/14 0546 12/14/14 0748  BP: 92/68 109/61 102/62 121/77  Pulse: 73 78 76 145  Temp: 98.4 F (36.9 C) 98 F (36.7 C) 98.6 F (37 C)   TempSrc: Oral Oral Oral   Resp: 18     Height:      Weight:   196 lb 9.6 oz (89.177 kg)   SpO2: 98% 98% 97%     Intake/Output Summary (Last 24 hours) at 12/14/14 0833 Last data filed at 12/14/14 [id number]  Gross per 24 hour  Intake   1020 ml  Output   2725 ml  Net  -1705 ml   Filed Weights   12/12/14 0407 12/13/14 0445 12/14/14 0546  Weight: 201 lb 8 oz (91.4 kg) 199 lb 8.3 oz (90.5 kg) 196 lb 9.6 oz (89.177 kg)    PHYSICAL EXAM General: Well developed, well nourished, male in no acute distress. Head: Normocephalic, atraumatic.  Neck: Supple without bruits, JVD not elevated. Lungs:  Resp regular and unlabored, Rales at bases bilaterally. Heart: REGULAR rhythm, tachycardiac rate, S1, S2, no S3, S4, or murmur; no rub. Abdomen: Soft, non-tender, non-distended with normoactive bowel sounds. No  hepatomegaly. No rebound/guarding. No obvious abdominal masses. Extremities: No clubbing, cyanosis, or edema. Distal pedal pulses are 2+ bilaterally. Neuro: Alert and oriented X 3. Moves all extremities spontaneously. Psych: Normal affect.  LABS: CBC: Recent Labs  12/12/14 0339 12/13/14 0515  WBC 11.7* 12.9*  HGB 12.9* 13.4  HCT 38.2* 40.7  MCV 88.6 89.8  PLT 272 301   INR:No results for input(s): INR in the last 72 hours. Basic Metabolic Panel: Recent Labs  12/12/14 1148  NA 134*  K 3.9  CL 100*  CO2 26  GLUCOSE 97  BUN 20  CREATININE 1.09  CALCIUM 8.7*   BNP:  B NATRIURETIC PEPTIDE  Date/Time Value Ref Range Status  12/09/2014 01:00 PM 252.8* 0.0 - 100.0 pg/mL Final  12/07/2014 07:50 AM 45.0 0.0 - 100.0 pg/mL Final    TELE:   NSR with rate in 60's - 80's. Has been in Sinus tach this morning with rates in 120's - 140's.      Radiology/Studies: Ct Angio Chest Pe W/cm &/or Wo Cm: 12/12/2014  CLINICAL DATA:  Shortness of Breath EXAM: CT ANGIOGRAPHY CHEST WITH CONTRAST TECHNIQUE: Multidetector CT imaging of the chest was performed using the standard protocol during bolus administration of intravenous contrast. Multiplanar CT image reconstructions and MIPs were obtained to evaluate the vascular anatomy. CONTRAST:  19mL OMNIPAQUE IOHEXOL 350 MG/ML SOLN COMPARISON:  Chest radiograph December 09, 2014  FINDINGS: There are small peripheral upper lobe pulmonary emboli bilaterally. There is a slightly larger pulmonary embolus extending into the anterior segment of the right upper lobe. No more central pulmonary emboli are identified. There is no demonstrable right heart strain. There is no demonstrable thoracic aortic aneurysm. The visualized great vessels appear unremarkable. There are bilateral pleural effusions with patchy consolidation in both lung bases. There is also interstitial edema in both lower lobes. There is mild atelectatic change in the periphery of both upper lobes.  Visualized thyroid appears normal. There are scattered subcentimeter lymph nodes without adenopathy by size criteria. The pericardium is not thickened. There is calcification in the left anterior descending coronary artery. In the visualized upper abdomen, no focal lesions are identified beyond atherosclerotic calcification in the aorta. There are no blastic or lytic bone lesions. There is mild degenerative change in the thoracic spine. Review of the MIP images confirms the above findings. IMPRESSION: Peripheral upper lobe pulmonary emboli, largest present in the anterior segment right upper lobe pulmonary artery. No more central pulmonary emboli. No evidence of right heart strain. Findings indicative of a degree of congestive heart failure. Specifically there are bilateral pleural effusions with interstitial edema. Consolidation in the bases is in part due to atelectasis. There may be a degree of early pneumonia in the lung bases as well. No appreciable adenopathy. Critical Value/emergent results were called by telephone at the time of interpretation on 12/12/2014 at 10:10 am to Dr. Mertie Moores , who verbally acknowledged these results. Electronically Signed   By: Lowella Grip III M.D.   On: 12/12/2014 10:10     Current Medications:  . antiseptic oral rinse  7 mL Mouth Rinse BID  . aspirin  81 mg Oral Daily  . atorvastatin  80 mg Oral q1800  . clopidogrel  75 mg Oral Daily  . colchicine  0.6 mg Oral Daily  . furosemide  40 mg Intravenous Q12H  . insulin aspart  0-15 Units Subcutaneous TID WC  . metoprolol tartrate  25 mg Oral BID  . pantoprazole  40 mg Oral Q1200  . potassium chloride  40 mEq Oral BID  . Rivaroxaban  15 mg Oral BID WC   Followed by  . [START ON 01/01/2015] rivaroxaban  20 mg Oral Q supper      ASSESSMENT AND PLAN:  1. Large inferior myocardial infarction complicated by cardiogenic shock and microcirculatory embolization at the time of PCI. - admitted on 12/07/2014 for  STEMI. Cath showed 100% stenosis of RCA with successful PCI using DES. Also had 85-95% stenosis in the LAD and will need future staged PCI of the lesion.  - being diuresed with 40mg  IV BID. Net output of -4.1L this admission. Can likely switch to PO tomorrow. - continue ASA, statin, Plavix, and BB.  2. Ventricular fibrillation - resolved and has not recurred  3. Acute combined systolic and diastolic heart failure - EF 50-55% on echo (12/07/2014) - continue current diuresis with Lasix 40mg  IV BID. - continue BB. Consider ACE-I once diuresis with IV Lasix is complete (BP is soft at times in the 90's).  4. Residual complex LAD disease which will require PCI. - Will likely be done after he fully recovers from this event. Therefore, plan is medical therapy, discharge home, follow-up with Dr. Martinique, and staged PCI in several weeks.  5. Paroxysmal Atrial fib:  - converted to NSR on 12/13/2014 - continue Xarelto for anticoagulation - BB for rate control. - having sinus tach this  AM. Questionable P-waves on telemetry at times but on exam, his HR is regular. Will order EKG to confirm he is still in sinus rhythm. May need to increase BB if he maintains this rate.  6. Pulmonary Embolism - CTA on 12/12/2014 showed a peripheral upper lobe pulmonary emboli, largest present in the anterior segment right upper lobe pulmonary artery. No evidence of right heart strain. - Continue Xarelto  7. Swollen and Tender Right Great Toe - concerning for possible gout vs. Arthritis. Patient says it has severely worsened over the past 2 days. - Uric Acid level normal - significant improvement in pain since starting empiric Colchicine 0.6mg  daily.   Arna Medici , PA-C 8:33 AM 12/14/2014 Pager: [phone]    Attending Note:   The patient was seen and examined.  Agree with assessment and plan as noted above.  Changes made to the above note as needed.  Mr. Coates is doing ok. Has developed  a rapid tachycardia this am.  Breathing is ok  1. CAD :  No further angina .    2. Paroxysmal atrial fib:  The tachycardia this am appears to be very regular.   ECG being done now.  3. Pulmonary embolus.  :  On Xarelto 15 BID   4. Acute diastolic CHF : continue lasix IV for now   5. ? Gout:  Toe pain is better on colchicine , continue   Thayer Headings, Brooke Bonito., MD, [facility] 12/14/2014, 10:15 AM [address],  [place] Pager [phone]

## 2014-12-14 NOTE — Progress Notes (Addendum)
   EKG this morning confirmed the patient was back in atrial fibrillation. Discussed with Dr. Acie Fredrickson and we will be restarting him on a Diltiazem drip and Amiodarone 400mg  BID for now. Continue to monitor HR and BP closely.   Patient informed and made aware. Please call with questions.  Signed, Erma Heritage, PA-C 12/14/2014, 10:36 AM Pager: [phone]  ADDENDUM: 11:11AM:  Informed by patient's nurse that as she was going to start IV Cardizem, his pulse was back in the 70's - 80's. We reviewed the telemetry and he appeared to be back in NSR. An EKG was ordered and confirmed this. Will discontinue order for Cardizem. Continue Amiodarone 400mg  BID.  Signed, Erma Heritage, PA-C 12/14/2014, 11:12 AM Pager: [phone]

## 2014-12-14 NOTE — Progress Notes (Signed)
Patient noted to be in sinus tachycardia with a rate of 130-140. Pt asymptomatic. PA Brittney Simmons her on unit and made aware. States she will look at pt's chart and follow up with any new orders. [facility] [employer]

## 2014-12-14 NOTE — Care Management Important Message (Signed)
Important Message  Patient Details  Name: Ryan Ward MRN: [id number] Date of Birth: [birth year]   Medicare Important Message Given:  Yes    Nathen May 12/14/2014, 9:51 AM

## 2014-12-14 NOTE — Progress Notes (Signed)
CARDIAC REHAB PHASE I   PRE:  Rate/Rhythm: 73 SR    BP: sitting 105/68    SaO2: 95 RA  MODE:  Ambulation: 550 ft   POST:  Rate/Rhythm: 91 SR    BP: sitting 114/72     SaO2: 100 RA  Pt steady with RW (still using due to toe pain). Somewhat SOB walking and talking. Did not need to rest but I could hear him. Continued to discuss ed including NTG, Afib (gave booklet), CRPII, and using IS. Encouraged more walking as well. Gave modified walking guidelines for home until staged PCI. Pt and wife voiced understanding. [place], [place], ACSM 12/14/2014 2:48 PM

## 2014-12-15 ENCOUNTER — Telehealth: Payer: Self-pay | Admitting: Physician Assistant

## 2014-12-15 DIAGNOSIS — I480 Paroxysmal atrial fibrillation: Secondary | ICD-10-CM | POA: Insufficient documentation

## 2014-12-15 DIAGNOSIS — I2699 Other pulmonary embolism without acute cor pulmonale: Secondary | ICD-10-CM | POA: Insufficient documentation

## 2014-12-15 LAB — GLUCOSE, CAPILLARY
GLUCOSE-CAPILLARY: 102 mg/dL — AB (ref 65–99)
Glucose-Capillary: 95 mg/dL (ref 65–99)

## 2014-12-15 MED ORDER — COLCHICINE 0.6 MG PO TABS: 0.6000 mg | ORAL_TABLET | Freq: Every day | ORAL | Status: DC

## 2014-12-15 MED ORDER — ASPIRIN 81 MG PO CHEW
81.0000 mg | CHEWABLE_TABLET | Freq: Every day | ORAL | Status: DC
Start: 2014-12-15 — End: 2015-01-27

## 2014-12-15 MED ORDER — ATORVASTATIN CALCIUM 80 MG PO TABS: 80.0000 mg | ORAL_TABLET | Freq: Every day | ORAL | Status: DC

## 2014-12-15 MED ORDER — METOPROLOL TARTRATE 50 MG PO TABS: 50.0000 mg | ORAL_TABLET | Freq: Two times a day (BID) | ORAL | Status: DC

## 2014-12-15 MED ORDER — RIVAROXABAN 20 MG PO TABS: 20.0000 mg | ORAL_TABLET | Freq: Every day | ORAL | Status: DC

## 2014-12-15 MED ORDER — RIVAROXABAN 15 MG PO TABS: 15.0000 mg | ORAL_TABLET | Freq: Two times a day (BID) | ORAL | Status: DC

## 2014-12-15 MED ORDER — ASPIRIN 81 MG PO CHEW: 81.0000 mg | CHEWABLE_TABLET | Freq: Every day | ORAL | Status: DC

## 2014-12-15 MED ORDER — NITROGLYCERIN 0.4 MG SL SUBL: 0.4000 mg | SUBLINGUAL_TABLET | SUBLINGUAL | Status: DC | PRN

## 2014-12-15 MED ORDER — CLOPIDOGREL BISULFATE 75 MG PO TABS: 75.0000 mg | ORAL_TABLET | Freq: Every day | ORAL | Status: DC

## 2014-12-15 MED ORDER — AMIODARONE HCL 200 MG PO TABS: ORAL_TABLET | ORAL | Status: DC

## 2014-12-15 MED ORDER — AMIODARONE HCL 200 MG PO TABS: 200.0000 mg | ORAL_TABLET | Freq: Two times a day (BID) | ORAL | Status: DC

## 2014-12-15 NOTE — Telephone Encounter (Signed)
TCM   Appt scheduled on 12/27 @ 10:40a with Nicki Reaper weaver per Bernerd Pho PA-C

## 2014-12-15 NOTE — Plan of Care (Signed)
Problem: Education: Goal: Knowledge of [facility] Education information/materials will improve Outcome: Progressing Patient currently in recovery phase from STEMI (post stent to RCA, needing staged PCI of LAD) with refractory systolic/diastolic failure secondary to cardiogenic shock, pulmonary embolus and deep venous thromboembolism of right lower extremity. Currently moderately-well compensated with some signs/symptoms of pump failure:  Bibasilar crackles on auscultation of lung Pasion  Moderate activity intolerance/dyspnea on exertion  Mild orthopnea; improved with CPAP at HS and current high-dose IV diuretic regimen  Peripheral edema; decreased since last assessment  Ascites  Also with paroxysmal, new-onset atrial fibrillation with rapid ventricular response (currently sustaining NSR with metoprolol tartrate 50 mg PO BID and amiodarone 400 mg PO BID).  Progress per MI, CHF, VTE and atrial arrhythmia pathways. Patient's only active complaint is nagging cough (congested, generally non-productive); implemented pulmonary toilet with turn, cough, deep breathe. Robitussin DM 10 ml PO every four hours; initiated per cardiology PRN standing orders.  No signs/symptoms of infection (afebrile with stable vital signs). No signs/symptoms of pulmonary toxicity on amiodarone therapy.  Recent vital signs: Filed Vitals:    12/14/14 1046 12/14/14 1636 12/14/14 2113 12/14/14 2323  BP: 103/75 105/65 108/68    Pulse: 81 70 79 76  Temp:   98.3 F (36.8 C) 98.6 F (37 C)    TempSrc:   Oral Oral    Resp:   15   17  Height:          Weight:          SpO2:   99% 96% 98%   Patient educated re:  Plans of care (MI, CHF, VTE, atrial arrythmia - basic)  Pulmonary toilet (TCDB, will request incentive spirometry orders in AM)  Medications  Metoprolol tartrate, up-titrated from 25 mg PO to 50 mg PO BID  Amiodarone 400 mg PO daily  Xarelto 20 mg PO daily  Atorvastatin 80 mg PO  daily  Aspirin 81 mg PO daily  Cetylpyridinium oral rinse PO BID  Tests/procedures (none currently upcoming - reviewed known results)  Activity progression - bedrest with bathroom priveleges  Strict intake/output monitoring  Daily weight on standing scale  Safety plan  Discharge plan/goals (basic)  Patient will need ongoing educational assessment and reinforcement prior to discharge to ensure safety and compliance with therapy at home.  Patient voiding independently without problems since removal of indwelling Foley catheter. Collecting urine appropriately and diuresing well; significant weight loss since beginning therapy (last weight 89.2 kg; admit weight 99.7 kg), net loss of 5590 ml since admit date.  Continuing to closely monitor.

## 2014-12-15 NOTE — Discharge Summary (Signed)
CARDIOLOGY DISCHARGE SUMMARY   Patient ID: Ryan Ward MRN: [id number] DOB/AGE: 1945/03/13 69 y.o.  Admit date: 12/07/2014 Discharge date: 12/15/2014  PCP: No primary care provider on file. Primary Cardiologist: Dr. Martinique  Primary Discharge Diagnosis: ST elevation (STEMI) myocardial infarction involving right coronary artery Secondary Discharge Diagnosis:    Complete heart block ([place])   Cardiogenic shock (HCC)   HTN (hypertension)   Hematuria   Ventricular fibrillation (HCC)   CAD (coronary artery disease), native coronary artery   Acute combined systolic and diastolic heart failure ([place])   Pulmonary embolism without acute cor pulmonale (HCC)   Paroxysmal atrial fibrillation ([place])  Consults: None  Procedures: Left Heart Catheterization, Coronary Angiography, Coronary Stent Intervention, IABP Insertion, Temporary Pacemaker, Transthoracic Echocardiogram  Hospital Course: Acxel Tumminia is a 69 y.o. male with past medical history of HTN, HLD, and OSA who presented to [facility] ED on 12/07/2014 as a CODE STEMI.   During his catheterization procedure he had complete heart block and cardiogenic shock and was placed on Levophed. His cath showed 85% stenosis in the proximal to mid LAD, 95% stenosis in mid LAD, 80% stenosis in ostial left circumflex, 100% stenosis in the distal RCA, and an of EF 35-45%. The RCA lesion was treated with a Promus DES. He was placed on ASA and Brilinta with the plan to consider PCI of the LAD once recovered from his acute infarct.   Following his catheterization he went into ventricular fibrillation and CODE BLUE was called. CPR was initiated and he was defibrillated once with return to NSR. 150 mg bolus of IV Amiodarone was also administered. This event was thought to be secondary to a pacemaker spike on the T-wave and therefore the temporary pacer was turned off.  On 12/08/2014 he was doing well. No repeat episodes of ventricular ectopy occurred. His  cyclic troponin values peaked at > 65.0. He ambulated with cardiac rehab on 12/09/2014 and developed new onset dyspnea. His CXR was consistent with CHF and he was started on IV Lasix 40mg  IV BID. Lower extremity dopplers were obtained and positive for DVT in the right posterior tibial and peroneal veins. He was started on empiric Heparin for possible PE.  He was transferred to telemetry on 12/10/2014. Diuresis was continued with IV Lasix. On 12/11/2014, his Heparin was switched to Xarelto 15mg  BID for 21 days with the plan to go to 20mg  daily following the starter dose. With starting Xarelto, his Brilinta was discontinued and Plavix was started.  On 12/12/2014, he was noted to be in atrial fibrillation that morning. A Cardizem drip was started. Due to his new onset atrial fibrillation, a CTA was obtained which showed a peripheral upper lobe pulmonary emboli, largest presence in the anterior segment right upper lobe pulmonary artery. No evidence of right heart strain was noted. With this new finding, it was decided the procedure for PCI of his LAD may need to be further out because we would not want to hold his Xarelto in the setting of a PE.  Overnight, he converted to NSR. The next morning, he ambulated over 350 ft with cardiac rehab without difficulty. He did report new onset pain in his right great toe which was swollen and exquisitely tender. He was started on empiric Colchicine 0.6mg  daily for acute gout.  On 12/14/2014, he was noted to be tachycardiac again into the 140's. His EKG confirmed he was back in atrial fibrillation. Within the next hour before his Diltiazem drip could be started,  his HR was back in the 70's and his EKG confirmed he was back in NSR. Therefore Cardizem was discontinued and he was placed on Amiodarone 400mg  BID. He ambulated another 550 ft with cardiac rehab without any difficulty.  On 12/15/2014, he still denied any chest pain, palpitations, or shortness of breath. His urine  was noted to be tea colored and he was taken off of IV Lasix. He had a net output of -5.7L and his dry weight was determined to be 194 lbs. He was not discharged on Lasix, as it was thought his CHF was acute and he was at baseline when examined.  He was discharged on Amiodarone (200mg  BID for 1 week, followed by 200mg  daily), Atorvastatin 80mg  daily, ASA 81 mg (with instructions to discontinue on 01/07/2015), Plavix 75mg  daily, Metoprolol 50mg  BID, Xarelto (15mg  BID until December 30th, then 20mg  daily afterwards), and Colchicine 0.6mg  daily. An ACE-I was not initiated due to his episodes of hypotension. These instructions were reviewed with the patient and his wife as well as included in his AVS.  The patient was last examined by Dr. Acie Fredrickson and deemed stable for discharge. He will have a TCM appointment on 12/28/2014 with Richardson Dopp, PA-C for follow-up evaluation and to potentially plan his PCI of the LAD in the future.   Labs:   Lab Results  Component Value Date   WBC 12.9* 12/13/2014   HGB 13.4 12/13/2014   HCT 40.7 12/13/2014   MCV 89.8 12/13/2014   PLT 301 12/13/2014     Recent Labs Lab 12/14/14 0839  NA 138  K 4.2  CL 100*  CO2 26  BUN 26*  CREATININE 1.20  CALCIUM 9.0  GLUCOSE 106*   No results for input(s): CKTOTAL, CKMB, CKMBINDEX, TROPONINI in the last 72 hours. Lipid Panel     Component Value Date/Time   CHOL 148 12/08/2014 0113   TRIG 94 12/08/2014 0113   HDL 33* 12/08/2014 0113   CHOLHDL 4.5 12/08/2014 0113   VLDL 19 12/08/2014 0113   LDLCALC 96 12/08/2014 0113    B NATRIURETIC PEPTIDE  Date/Time Value Ref Range Status  12/09/2014 01:00 PM 252.8* 0.0 - 100.0 pg/mL Final  12/07/2014 07:50 AM 45.0 0.0 - 100.0 pg/mL Final    Radiology: Dg Chest 2 View  12/09/2014  CLINICAL DATA:  Chest pain.  Follow-up MI. EXAM: CHEST  2 VIEW COMPARISON:  None FINDINGS: The heart is mildly enlarged. There are changes of interstitial and mild alveolar pulmonary edema. More  confluent opacity at the lung bases, left greater than right, suggest consolidation and/or atelectasis. There are bilateral pleural effusions. IMPRESSION: 1. Cardiomegaly and changes of mild edema. 2. More confluent opacity at the bases is favored to represent atelectasis or infiltrates. Electronically Signed   By: Nolon Nations M.D.   On: 12/09/2014 12:04   Ct Angio Chest Pe W/cm &/or Wo Cm  12/12/2014  CLINICAL DATA:  Shortness of Breath EXAM: CT ANGIOGRAPHY CHEST WITH CONTRAST TECHNIQUE: Multidetector CT imaging of the chest was performed using the standard protocol during bolus administration of intravenous contrast. Multiplanar CT image reconstructions and MIPs were obtained to evaluate the vascular anatomy. CONTRAST:  59mL OMNIPAQUE IOHEXOL 350 MG/ML SOLN COMPARISON:  Chest radiograph December 09, 2014 FINDINGS: There are small peripheral upper lobe pulmonary emboli bilaterally. There is a slightly larger pulmonary embolus extending into the anterior segment of the right upper lobe. No more central pulmonary emboli are identified. There is no demonstrable right heart strain. There is  no demonstrable thoracic aortic aneurysm. The visualized great vessels appear unremarkable. There are bilateral pleural effusions with patchy consolidation in both lung bases. There is also interstitial edema in both lower lobes. There is mild atelectatic change in the periphery of both upper lobes. Visualized thyroid appears normal. There are scattered subcentimeter lymph nodes without adenopathy by size criteria. The pericardium is not thickened. There is calcification in the left anterior descending coronary artery. In the visualized upper abdomen, no focal lesions are identified beyond atherosclerotic calcification in the aorta. There are no blastic or lytic bone lesions. There is mild degenerative change in the thoracic spine. Review of the MIP images confirms the above findings. IMPRESSION: Peripheral upper lobe  pulmonary emboli, largest present in the anterior segment right upper lobe pulmonary artery. No more central pulmonary emboli. No evidence of right heart strain. Findings indicative of a degree of congestive heart failure. Specifically there are bilateral pleural effusions with interstitial edema. Consolidation in the bases is in part due to atelectasis. There may be a degree of early pneumonia in the lung bases as well. No appreciable adenopathy. Critical Value/emergent results were called by telephone at the time of interpretation on 12/12/2014 at 10:10 am to Dr. Mertie Moores , who verbally acknowledged these results. Electronically Signed   By: Lowella Grip III M.D.   On: 12/12/2014 10:10    Cardiac Cath: 12/07/2014  Prox LAD to Mid LAD lesion, 85% stenosed.  Mid LAD to Dist LAD lesion, 95% stenosed.  Ost Cx lesion, 80% stenosed.  Prox RCA to Mid RCA lesion, 30% stenosed.  Mid RCA to Dist RCA lesion, 40% stenosed.  There is moderate left ventricular systolic dysfunction.  Dist RCA lesion, 100% stenosed. Post intervention, there is a 0% residual stenosis. The lesion was previously treated with a stent (unknown type).  1. Severe 3 vessel obstructive CAD  - 100% occlusion of large RCA distally with very heavy thrombus burden.  _ severe proximal and mid LAD disease  -- Moderate to severe ostial LCx disease- vessel is very small. 2. Moderate LV dysfunction with inferior wall motion abnormality. 3. Successful PCI of the distal RCA with DES and balloon angioplasty for thrombus. 4. Cardiogenic shock- IABP placed and patient started on pressors. 5. Complete heart block. Temporary pacemaker placed.   Plan: transfer to ICU for continued support. Continue IV Aggrastat for 18 hours. When IV Angiomax runs out will transition to IV heparin. Continue DAPT with ASA and Brilinta. When patient recovers from acute infarct will need to consider PCI of the LAD.   Echo: 12/07/2014 Study  Conclusions - Left ventricle: Infeior and posterior lateral hypokinesis The cavity size was normal. Wall thickness was normal. Systolic function was normal. The estimated ejection fraction was in the range of 50% to 55%. - Aorta: Catheter seen in descending thoracic aorta ? Impella. - Atrial septum: No defect or patent foramen ovale was identified. - Impressions: ? pacing catheter seen in IVC.  Impressions: - ? pacing catheter seen in IVC.  FOLLOW UP PLANS AND APPOINTMENTS No Known Allergies   Medication List    TAKE these medications        amiodarone 200 MG tablet  Commonly known as:  PACERONE  Take 200mg  (1 tablet) twice a day for 1 week. Starting on 12/22/2014, take 200mg  (1 tablet) once a day.     aspirin 81 MG chewable tablet  Chew 1 tablet (81 mg total) by mouth daily. Quit taking on 01/07/2015.     atorvastatin  80 MG tablet  Commonly known as:  LIPITOR  Take 1 tablet (80 mg total) by mouth daily at 6 PM.     CENTRUM SILVER tablet  Take 1 tablet by mouth daily.     clopidogrel 75 MG tablet  Commonly known as:  PLAVIX  Take 1 tablet (75 mg total) by mouth daily.     colchicine 0.6 MG tablet  Take 1 tablet (0.6 mg total) by mouth daily.  Notes to Patient:  TAKE AS NEEDED FOR GOUT.     metoprolol 50 MG tablet  Commonly known as:  LOPRESSOR  Take 1 tablet (50 mg total) by mouth 2 (two) times daily.     nitroGLYCERIN 0.4 MG SL tablet  Commonly known as:  NITROSTAT  Place 1 tablet (0.4 mg total) under the tongue every 5 (five) minutes x 3 doses as needed for chest pain.     Rivaroxaban 15 MG Tabs tablet  Commonly known as:  XARELTO  Take 1 tablet (15 mg total) by mouth 2 (two) times daily with a meal.  Notes to Patient:  QUIT TAKING ON 12/31/2014 and start taking the 20mg  tablets.     rivaroxaban 20 MG Tabs tablet  Commonly known as:  XARELTO  Take 1 tablet (20 mg total) by mouth daily with supper.  Start taking on:  01/01/2015     Vitamin D3 5000 UNITS  Tabs  Take 1 tablet by mouth daily.        Discharge Instructions    AMB Referral to Cardiac Rehabilitation - Phase II    Complete by:  As directed   Diagnosis:  Myocardial Infarction          Follow-up Information    Follow up with Richardson Dopp, PA-C On 12/28/2014.   Specialties:  Physician Assistant, Radiology, Interventional Cardiology   Why:  Cardiology Hospital Follow-Up on 12/28/2014 at 10:40AM.   Contact information:   [address] [place] [address] [phone]       BRING ALL MEDICATIONS WITH YOU TO FOLLOW UP APPOINTMENTS  Time spent with patient to include physician time: 45 minutes  Signed: Erma Heritage, PA 12/15/2014, 9:44 PM Co-Sign MD   Attending Note:   The patient was seen and examined.  Agree with assessment and plan as noted above.  Changes made to the above note as needed.  Pt is doing much better. Breathing well Walking with out problems  DC to home    Thayer Headings, Brooke Bonito., MD, [facility] 12/16/2014, 8:49 PM [address],  [place] Pager [phone]

## 2014-12-15 NOTE — Progress Notes (Addendum)
Patient Name: Ryan Ward Date of Encounter: 12/15/2014  Principal Problem:   ST elevation (STEMI) myocardial infarction involving right coronary artery ([place]) Active Problems:   Complete heart block (HCC)   Cardiogenic shock (HCC)   HTN (hypertension)   Hematuria   Ventricular fibrillation (HCC)   CAD (coronary artery disease), native coronary artery   Acute combined systolic and diastolic heart failure [facility])    Primary Cardiologist: Dr. Martinique Patient Profile: 69 yo male w/ PMH of HTN, HLD, and OSA admitted on 12/07/2014 for STEMI. Cath showed 100% stenosis of RCA with successful PCI using DES. Also had 85-95% stenosis in the LAD and will need future staged PCI of the lesion. Developed atrial fibrillation w/ RVR on 12/12/2014 and converted that night.  SUBJECTIVE: Denies any chest pain, shortness of breath, or palpitations. Reports significant improvement in his right great toe pain.   OBJECTIVE Filed Vitals:   12/14/14 1636 12/14/14 2113 12/14/14 2323 12/15/14 0500  BP: 105/65 108/68  100/65  Pulse: 70 79 76 73  Temp: 98.3 F (36.8 C) 98.6 F (37 C)  98.5 F (36.9 C)  TempSrc: Oral Oral  Oral  Resp: 15  17   Height:      Weight:    194 lb 12.8 oz (88.361 kg)  SpO2: 99% 96% 98% 98%    Intake/Output Summary (Last 24 hours) at 12/15/14 0945 Last data filed at 12/15/14 0617  Gross per 24 hour  Intake    660 ml  Output   2125 ml  Net  -1465 ml   Filed Weights   12/13/14 0445 12/14/14 0546 12/15/14 0500  Weight: 199 lb 8.3 oz (90.5 kg) 196 lb 9.6 oz (89.177 kg) 194 lb 12.8 oz (88.361 kg)    PHYSICAL EXAM General: Well developed, well nourished, male in no acute distress. Head: Normocephalic, atraumatic.  Neck: Supple without bruits, JVD not elevated. Lungs:  Resp regular and unlabored, Minimal rales at bases bilaterally. Heart: RRR, S1, S2, no S3, S4, or murmur; no rub. Abdomen: Soft, non-tender, non-distended with normoactive bowel sounds. No hepatomegaly.  No rebound/guarding. No obvious abdominal masses. Extremities: No clubbing, cyanosis, or edema. Distal pedal pulses are 2+ bilaterally. Neuro: Alert and oriented X 3. Moves all extremities spontaneously. Psych: Normal affect.  LABS: CBC:  Recent Labs  12/13/14 0515  WBC 12.9*  HGB 13.4  HCT 40.7  MCV 89.8  PLT 301   INR:No results for input(s): INR in the last 72 hours. Basic Metabolic Panel:  Recent Labs  12/12/14 1148 12/14/14 0839  NA 134* 138  K 3.9 4.2  CL 100* 100*  CO2 26 26  GLUCOSE 97 106*  BUN 20 26*  CREATININE 1.09 1.20  CALCIUM 8.7* 9.0   BNP:  B NATRIURETIC PEPTIDE  Date/Time Value Ref Range Status  12/09/2014 01:00 PM 252.8* 0.0 - 100.0 pg/mL Final  12/07/2014 07:50 AM 45.0 0.0 - 100.0 pg/mL Final    TELE:   NSR with rate in 60's - 80's. Has been in Sinus tach this morning with rates in 120's - 140's.      Radiology/Studies: Ct Angio Chest Pe W/cm &/or Wo Cm: 12/12/2014  CLINICAL DATA:  Shortness of Breath EXAM: CT ANGIOGRAPHY CHEST WITH CONTRAST TECHNIQUE: Multidetector CT imaging of the chest was performed using the standard protocol during bolus administration of intravenous contrast. Multiplanar CT image reconstructions and MIPs were obtained to evaluate the vascular anatomy. CONTRAST:  17mL OMNIPAQUE IOHEXOL 350 MG/ML SOLN COMPARISON:  Chest  radiograph December 09, 2014 FINDINGS: There are small peripheral upper lobe pulmonary emboli bilaterally. There is a slightly larger pulmonary embolus extending into the anterior segment of the right upper lobe. No more central pulmonary emboli are identified. There is no demonstrable right heart strain. There is no demonstrable thoracic aortic aneurysm. The visualized great vessels appear unremarkable. There are bilateral pleural effusions with patchy consolidation in both lung bases. There is also interstitial edema in both lower lobes. There is mild atelectatic change in the periphery of both upper lobes.  Visualized thyroid appears normal. There are scattered subcentimeter lymph nodes without adenopathy by size criteria. The pericardium is not thickened. There is calcification in the left anterior descending coronary artery. In the visualized upper abdomen, no focal lesions are identified beyond atherosclerotic calcification in the aorta. There are no blastic or lytic bone lesions. There is mild degenerative change in the thoracic spine. Review of the MIP images confirms the above findings. IMPRESSION: Peripheral upper lobe pulmonary emboli, largest present in the anterior segment right upper lobe pulmonary artery. No more central pulmonary emboli. No evidence of right heart strain. Findings indicative of a degree of congestive heart failure. Specifically there are bilateral pleural effusions with interstitial edema. Consolidation in the bases is in part due to atelectasis. There may be a degree of early pneumonia in the lung bases as well. No appreciable adenopathy. Critical Value/emergent results were called by telephone at the time of interpretation on 12/12/2014 at 10:10 am to Dr. Mertie Moores , who verbally acknowledged these results. Electronically Signed   By: Lowella Grip III M.D.   On: 12/12/2014 10:10     Current Medications:  . amiodarone  400 mg Oral BID  . antiseptic oral rinse  7 mL Mouth Rinse BID  . aspirin  81 mg Oral Daily  . atorvastatin  80 mg Oral q1800  . clopidogrel  75 mg Oral Daily  . colchicine  0.6 mg Oral Daily  . furosemide  40 mg Intravenous Q12H  . insulin aspart  0-15 Units Subcutaneous TID WC  . metoprolol tartrate  50 mg Oral BID  . pantoprazole  40 mg Oral Q1200  . potassium chloride  40 mEq Oral BID  . Rivaroxaban  15 mg Oral BID WC   Followed by  . [START ON 01/01/2015] rivaroxaban  20 mg Oral Q supper      ASSESSMENT AND PLAN:  1. Large inferior myocardial infarction complicated by cardiogenic shock and microcirculatory embolization at the time of  PCI. - admitted on 12/07/2014 for STEMI. Cath showed 100% stenosis of RCA with successful PCI using DES. Also had 85-95% stenosis in the LAD and will need future staged PCI of the lesion.  - being diuresed with 40mg  IV BID. Net output of -5.7L this admission. Weight down from 220 on admission to 194 lbs. Does not appear overly volume overloaded on exam. Hopefully can switch to PO Lasix in the next 24-48 hours. - continue ASA, statin, Plavix, and BB.  2. Ventricular fibrillation - resolved and has not recurred  3. Acute combined systolic and diastolic heart failure - EF 50-55% on echo (12/07/2014) - continue current diuresis with Lasix 40mg  IV BID. - continue BB. Consider ACE-I once diuresis with IV Lasix is complete (BP is soft at times in the 90's).  4. Residual complex LAD disease which will require PCI. - Will likely be done after he fully recovers from this event. Therefore, plan is medical therapy, discharge home, follow-up with Dr.  Martinique, and staged PCI in several weeks.  5. Paroxysmal Atrial fib:  - converted to NSR on 12/13/2014. Had episode of atrial fibrillation on 12/14/2014. Converted without medications within 2 hours. Started on Amiodarone 400mg  BID. - continue Xarelto for anticoagulation - BB for rate control.  6. Pulmonary Embolism - CTA on 12/12/2014 showed a peripheral upper lobe pulmonary emboli, largest present in the anterior segment right upper lobe pulmonary artery. No evidence of right heart strain. - Continue Xarelto  7. Swollen and Tender Right Great Toe - Uric Acid level normal - significant improvement in pain since starting empiric Colchicine 0.6mg  daily.   Arna Medici , PA-C 9:45 AM 12/15/2014 Pager: [phone]   Attending Note:   The patient was seen and examined.  Agree with assessment and plan as noted above.  Changes made to the above note as needed.  Feels great . No CP ,  1. CAD - no angian  2. PAF  Has maintained  NSR .   3. Pulmonary embolus:   On xarelto - doing welll  4. Tea colored urine:    May be due to volume depletion .   Will DC iv lasix   5. Acute diastolic CHF:   Had acute CHF symptoms following his  MI.   He has normal LV function and I think we will not need lasix at home .  He can follow daily weights    Has ambulated in the halls.   Will ambulate him again today to see how he does  He may be able to go home later today  His creatinine has gone up slightly   DC meds amio 200 BID for 1 week, then decrease to 200 a day  Atorvastatin 80 ASA 81 - for 1 month,  Then DC ( since he is on plavix and Xarelto)  plavix 75 a day - continue for at least 1 year  Colchicine 0.6 as needed Metoprolol 50 bid ntg prn xarelto 15 bid until Dec. 30 On Dec. 31, start Xarelto 20 mg a day   He is to take with his largest meal of the day  - this is typically dinner   Will need a visit in several weeks After   Thayer Headings, Brooke Bonito., MD, [facility] 12/15/2014, 10:06 AM [address]  [place] Pager [phone]

## 2014-12-15 NOTE — Plan of Care (Signed)
Problem: Consults Goal: Diagnosis - Venous Thromboembolism (VTE) Choose a selection Outcome: Completed/Met Date Met:  12/15/14 PE (Pulmonary Embolism) with DVT (Deep Venous Thromboembolism)     

## 2014-12-15 NOTE — Plan of Care (Signed)
Problem: Education: Goal: Ability to demonstrate managment of disease process will improve Outcome: Completed/Met Date Met:  12/15/14 Spoke with patient about using a Heart Healthy, low sodium diet. Also spoke about fluids and weight checks daily. Patient verbalized a understanding .     

## 2014-12-15 NOTE — Progress Notes (Signed)
CARDIAC REHAB PHASE I   PRE:  Rate/Rhythm: 73 SR     BP: sitting 103/67    SaO2: 97 RA  MODE:  Ambulation: 700 ft   POST:  Rate/Rhythm: 91 SR    BP: sitting 122/81     SaO2: 99 RA  Pt feeling better today. No afib noted. Some SOB, pt talked while walking entire walk. Pt seems to have trouble with short term memory (didn't remember things I told him yesterday). Not sure if this is baseline or new. Wife not present. All ed done. Pt can walk independently or with wife. [id number]   Darrick Meigs CES, ACSM 12/15/2014 9:02 AM

## 2014-12-16 NOTE — Telephone Encounter (Signed)
TOC call to patient.Stated he is doing good.Stated he understands discharge instructions and taking medications as prescribed.Advised to keep post hospital appointment with Richardson Dopp PA 12/28/14 at 10:40 am at [place] office.

## 2014-12-27 NOTE — Progress Notes (Signed)
Cardiology Office Note    Date:  12/28/2014   ID:  Ryan Ward, DOB 03-17-1945, MRN [id number]  PCP:  No primary care provider on file.  Cardiologist:  Dr. Peter Martinique   Electrophysiologist:  n/a  Chief Complaint  Patient presents with  . Hospitalization Follow-up    s/p STEMI >> PCI  . Coronary Artery Disease    pt c/o SOB on exertion, and a lump on right thigh  . Atrial Fibrillation    History of Present Illness:  Ryan Ward is a 69 y.o. male with a hx of HTN, HL, OSA. Admitted 12/6-12/14 with inferior STEMI. Emergent cardiac catheterization was complicated by complete heart block and cardiogenic shock. He was managed with vasopressors and temporary pacemaker. LHC demonstrated severe 3 vessel CAD with an occluded RCA, severe proximal mid LAD disease and moderate to severe ostial LCx disease (small vessel). He underwent PCI with a DES to the RCA. It was initially thought he would need consideration for PCI of LAD after recovery from MI. Hospitalization was complicated by ventricular fibrillation post PCI. He was treated with CPR and defibrillated 1 with ROSC. Event was thought to be related to pacemaker spike on T-wave. Therefore, pacemaker was turned off. EF was 35-45% by cardiac catheterization. Follow-up echo demonstrated EF 50-55%. He did develop volume excess and was treated with IV Lasix. He was noted to have a right posterior tibial and peroneal vein DVT on LE Dopplers. He was placed on heparin and transitioned to Xarelto.  Brilinta was stopped and he was placed on Plavix. He then developed atrial fibrillation.  He converted to NSR on IV diltiazem. CT scan confirmed pulmonary embolism; no R heart strain. Given acute pulmonary embolism, staged PCI to LAD was postponed. He required colchicine for treatment of gout. He had more atrial fibrillation. Diltiazem was discontinued. He was placed on amiodarone. He had good diuresis and was ultimately discharged on no diuretic. Dry weight  194 pounds. Plan was to continue triple therapy and discontinue aspirin 30 days out from myocardial infarction. He would then continue on Xarelto and Plavix. Blood pressure was soft and he was not placed on ACE inhibitor.  Returns for FU.  Since discharge, he has been doing well. He does note shortness of breath with mild to moderate activities. His breathing has remained stable. Weight since discharge from the hospital have ranged between 191 and 195. He denies significant pedal edema. He has noted a lump on his right thigh. It is mildly tender. He denies chest discomfort. He denies syncope. He denies orthopnea, PND. He did have dark-colored urine after coming home from the hospital. This has cleared. He has had some mild epistaxis. Denies melena or hematochezia.   Past Medical History  Diagnosis Date  . OSA (obstructive sleep apnea)     not using CPAP at home, does have machine  . Hypertension   . Hyperlipidemia   . STEMI (ST elevation myocardial infarction) ([facility]) 12/07/2014    100% stenosis of RCA w/ PCI using DES. 85-95% stenosis in the LAD and will need future staged PCI of the lesion.   . Paroxysmal atrial fibrillation ([place]) 12/12/2014  . Pulmonary embolism ([place]) 12/12/2014    Past Surgical History  Procedure Laterality Date  . Hernia repair    . Cardiac catheterization N/A 12/07/2014    Procedure: Coronary Stent Intervention;  Surgeon: Peter M Martinique, MD;  Location: [facility];  Service: Cardiovascular;  Laterality: N/A;  STEMI  . Cardiac catheterization N/A  12/07/2014    Procedure: IABP Insertion;  Surgeon: Peter M Martinique, MD;  Location: [facility];  Service: Cardiovascular;  Laterality: N/A;  . Cardiac catheterization N/A 12/07/2014    Procedure: Temporary Pacemaker;  Surgeon: Peter M Martinique, MD;  Location: [facility];  Service: Cardiovascular;  Laterality: N/A;  . Cardiac catheterization N/A 12/07/2014    Procedure: Left Heart Cath and Coronary Angiography;   Surgeon: Peter M Martinique, MD;  Location: [facility];  Service: Cardiovascular;  Laterality: N/A;    Current Outpatient Prescriptions  Medication Sig Dispense Refill  . amiodarone (PACERONE) 200 MG tablet Take 200mg  (1 tablet) twice a day for 1 week. Starting on 12/22/2014, take 200mg  (1 tablet) once a day. 60 tablet 6  . aspirin 81 MG chewable tablet Chew 1 tablet (81 mg total) by mouth daily. Quit taking on 01/07/2015.    [name] atorvastatin (LIPITOR) 80 MG tablet Take 1 tablet (80 mg total) by mouth daily at 6 PM. 30 tablet 6  . Cholecalciferol (VITAMIN D3) 5000 UNITS TABS Take 1 tablet by mouth daily.    . clopidogrel (PLAVIX) 75 MG tablet Take 1 tablet (75 mg total) by mouth daily. 30 tablet 11  . colchicine 0.6 MG tablet Take 1 tablet (0.6 mg total) by mouth daily. 30 tablet 3  . metoprolol (LOPRESSOR) 50 MG tablet Take 1 tablet (50 mg total) by mouth 2 (two) times daily. 60 tablet 6  . Multiple Vitamins-Minerals (CENTRUM SILVER) tablet Take 1 tablet by mouth daily.    . nitroGLYCERIN (NITROSTAT) 0.4 MG SL tablet Place 1 tablet (0.4 mg total) under the tongue every 5 (five) minutes x 3 doses as needed for chest pain. 25 tablet 0  . Rivaroxaban (XARELTO) 15 MG TABS tablet Take 1 tablet (15 mg total) by mouth 2 (two) times daily with a meal. 30 tablet 0  . [START ON 01/01/2015] rivaroxaban (XARELTO) 20 MG TABS tablet Take 1 tablet (20 mg total) by mouth daily with supper. 30 tablet 10   No current facility-administered medications for this visit.    Allergies:   Review of patient's allergies indicates no known allergies.   Social History   Social History  . Marital Status: Married    Spouse Name: N/A  . Number of Children: N/A  . Years of Education: N/A   Social History Main Topics  . Smoking status: Never Smoker   . Smokeless tobacco: None  . Alcohol Use: No  . Drug Use: No  . Sexual Activity: Not Asked   Other Topics Concern  . None   Social History Narrative      Family History:  The patient's family history includes Heart attack in his father.   ROS:   Please see the history of present illness.    ROS All other systems reviewed and are negative.   PHYSICAL EXAM:   VS:  BP 96/62 mmHg  Pulse 56  Ht 5\' 8"  (1.727 m)  Wt 205 lb 9.6 oz (93.26 kg)  BMI 31.27 kg/m2  SpO2 99%   GEN: Well nourished, well developed, in no acute distress HEENT: normal Neck: no JVD, no masses Cardiac: Normal S1/S2, RRR; no murmurs, rubs, or gallops, no edema;  R groin without hematoma or bruit however, there is a large area in the medial R thigh that is mildly tender (? Hematoma) that is smaller when he lays down - no ecchymosis    Respiratory:  clear to auscultation bilaterally; no wheezing, rhonchi  or rales GI: soft, nontender, nondistended, + BS MS: no deformity or atrophy Skin: warm and dry, no rash Neuro:  Bilateral strength equal, no focal deficits  Psych: Alert and oriented x 3, normal affect  Wt Readings from Last 3 Encounters:  12/28/14 205 lb 9.6 oz (93.26 kg)  12/15/14 194 lb 12.8 oz (88.361 kg)      Studies/Labs Reviewed:   EKG:  EKG is  ordered today.  The ekg ordered today demonstrates sinus bradycardia, HR 56, inferior Q waves,  T-wave inversions in 2, 3, aVF, V6,  incomplete RBBB, QTc 476 ms, no change from prior tracing  Recent Labs: 12/07/2014: ALT 73*; Magnesium 1.9; TSH 2.802 12/09/2014: B Natriuretic Peptide 252.8* 12/13/2014: Hemoglobin 13.4; Platelets 301 12/14/2014: BUN 26*; Creatinine, Ser 1.20; Potassium 4.2; Sodium 138   Recent Lipid Panel    Component Value Date/Time   CHOL 148 12/08/2014 0113   TRIG 94 12/08/2014 0113   HDL 33* 12/08/2014 0113   CHOLHDL 4.5 12/08/2014 0113   VLDL 19 12/08/2014 0113   LDLCALC 96 12/08/2014 0113    Additional studies/ records that were reviewed today include:   LE Vascular US 12/10/14 - Findings consistent with acute deep vein thrombosis involving the right posterior tibial and peroneal  veins.  Ct Angio Chest Pe W/cm &/or Wo Cm  12/12/2014   IMPRESSION: Peripheral upper lobe pulmonary emboli, largest present in the anterior segment right upper lobe pulmonary artery. No more central pulmonary emboli. No evidence of right heart strain. Findings indicative of a degree of congestive heart failure. Specifically there are bilateral pleural effusions with interstitial edema. Consolidation in the bases is in part due to atelectasis. There may be a degree of early pneumonia in the lung bases as well. No appreciable adenopathy. Critical Value/emergent results were called by telephone at the time of interpretation on 12/12/2014 at 10:10 am to Dr. Mertie Moores , who verbally acknowledged these results. Electronically Signed   By: Lowella Grip III M.D.   On: 12/12/2014 10:10    Echo 12/07/14 Inferior and posterior lateral HK, EF 50-55%  LHC 12/07/14 LM okay LAD proximal 85%, mid 95% LCx ostial 80% RCA proximal to mid 30%, mid to distal 40%, distal 100% EF 35-45% PCI: 4 x 20 mm Promus Premier DES to the distal RCA Conclusion: 1. Severe 3 vessel obstructive CAD  - 100% occlusion of large RCA distally with very heavy thrombus burden.  _ severe proximal and mid LAD disease  -- Moderate to severe ostial LCx disease- vessel is very small. 2. Moderate LV dysfunction with inferior wall motion abnormality. 3. Successful PCI of the distal RCA with DES and balloon angioplasty for thrombus. 4. Cardiogenic shock- IABP placed and patient started on pressors. 5. Complete heart block. Temporary pacemaker placed.   Plan: transfer to ICU for continued support. Continue IV Aggrastat for 18 hours. When IV Angiomax runs out will transition to IV heparin. Continue DAPT with ASA and Brilinta. When patient recovers from acute infarct will need to consider PCI of the LAD.        ASSESSMENT:    1. Coronary artery disease - s/p Inferior STEMI 12/07/14   2. Ventricular fibrillation ([place])   3.  Chronic diastolic CHF (congestive heart failure) (H[facility])   4. Paroxysmal atrial fibrillation ([facility])   5. Other acute pulmonary embolism without acute cor pulmonale ([place])   6. Essential hypertension   7. Hyperlipidemia   8. Thigh hematoma, right, initial encounter   9. Shortness of breath  PLAN:  In order of problems listed above:  1. CAD - s/p Large inferior myocardial infarction complicated by cardiogenic shock and microcirculatory embolization at the time of PCI.Cath showed 100% stenosis of RCA with successful PCI using DES. Also had 85-95% stenosis in the LAD and will need future staged PCI of the lesion. He is doing well since DC without angina.  He is somewhat SOB.  Suspect this may be multifactorial and related to his PE, residual CAD and deconditioning.    -  Continue ASA, Plavix, Xarelto.  After 01/07/15, DC ASA and continue Plavix, Xarelto.  -  I will review with Dr. Peter Martinique when staged PCI should be considered for his LAD.  -  Refer to Cardiac Rehab.    2. Ventricular fibrillation - VF arrest post PCI.  Records indicate pacer spike on TW likely cause.  EF recovered by FU echo.  He was placed on Amiodarone for PAF.    3. Chronic diastolic CHF - Patient had significant volume overload post PCI.  He had good diuresis and was not DC on Lasix.  EF was 50-55% by FU echo.  He has noted some SOB.  He is NYHA 2-2b.  He does not look volume overloaded.    -  BMET, BNP today.  If BNP high, will start low dose Lasix.   4. PAF - He was placed on Amiodarone and Xarelto in the hospital.  Plan was to continue Amio 200 bid x 1 week post DC then decrease to 200 QD.  He can start Amiodarone 200 mg QD now.    -  Arrange FU LFTs, TSH in 6 weeks.  -  If Amiodarone continued long term, he will need PFTs arranged.  5. Pulmonary Embolism - CTA on 12/12/2014 showed a peripheral upper lobe pulmonary emboli, largest present in the anterior segment right upper lobe pulmonary artery. No evidence of right  heart strain.  LE dopplers confirmed RLE DVT.  He is now on Xarelto. On 12/31, he will change from Xarelto 15 bid to Xarelto 20 QD.  I will check with Dr. Peter Martinique regarding timing of his staged PCI of the LAD.  Suspect some of his DOE is related to his PE.  O2 sats today are good.   6. HTN - BP soft.  At this time, we cannot start ACE inhibitor or angiotensin receptor blocker.    7. Hyperlipidemia - Continue statin.  Arrange Lipids and LFTs in 6 weeks.  8. R thigh hematoma - This feels like a hematoma.  However it is soft and gets smaller when he lays down.  Will get a CBC today and check R groin and R thigh [place] today.  I will see him in 1 week to recheck his thigh.     Medication Adjustments/Labs and Tests Ordered: Current medicines are reviewed at length with the patient today.  Concerns regarding medicines are outlined above.  Medication changes, Labs and Tests ordered today are listed below. Patient Instructions  Medication Instructions:  Your physician has recommended you make the following change in your medication:  1. Decrease Amiodarone ( 200 mg ) daily 2. Stop Asprin after January 07, 2015  Labwork: Your physician recommends that you have lab work today: bmet/cbc/bnp Your physician recommends that you return for a FASTING lipid profile: in 6 weeks: lipid/lft/tsh  Testing/Procedures: You will be having a doppler study at [facility] today to check right groin and right thigh for hematoma  Follow-Up: Your physician recommends that you  keep your schedule a follow-up appointment with Richardson Dopp, PA-C in one week Your physician recommends that you schedule a follow-up appointment at [facility] in 3-4 weeks  Any Other Special Instructions Will Be Listed Below (If Applicable).  You are being referred to Cardiac Rehab at [facility], Some one will be contacting you schedule  If you need a refill on your cardiac medications before your next appointment, please call your  pharmacy.    Signed, Richardson Dopp, PA-C  12/28/2014 11:28 AM    [facility] [place], [place], [place]  [address] Phone: [phone]; Fax: [phone]

## 2014-12-28 ENCOUNTER — Ambulatory Visit (INDEPENDENT_AMBULATORY_CARE_PROVIDER_SITE_OTHER): Payer: 59 | Admitting: Physician Assistant

## 2014-12-28 ENCOUNTER — Ambulatory Visit (HOSPITAL_COMMUNITY)
Admission: RE | Admit: 2014-12-28 | Discharge: 2014-12-28 | Disposition: A | Discharge status: Home / Self Care | Payer: 59 | Source: Ambulatory Visit | Attending: Cardiovascular Disease | Admitting: Cardiovascular Disease

## 2014-12-28 ENCOUNTER — Encounter: Payer: Self-pay | Admitting: Physician Assistant

## 2014-12-28 VITALS — BP 96/62 | HR 56 | Ht 68.0 in | Wt 205.6 lb

## 2014-12-28 DIAGNOSIS — R0602 Shortness of breath: Secondary | ICD-10-CM

## 2014-12-28 DIAGNOSIS — I2510 Atherosclerotic heart disease of native coronary artery without angina pectoris: Secondary | ICD-10-CM | POA: Insufficient documentation

## 2014-12-28 DIAGNOSIS — I5032 Chronic diastolic (congestive) heart failure: Secondary | ICD-10-CM | POA: Insufficient documentation

## 2014-12-28 DIAGNOSIS — I4901 Ventricular fibrillation: Secondary | ICD-10-CM

## 2014-12-28 DIAGNOSIS — I480 Paroxysmal atrial fibrillation: Secondary | ICD-10-CM

## 2014-12-28 DIAGNOSIS — S7011XA Contusion of right thigh, initial encounter: Secondary | ICD-10-CM | POA: Diagnosis not present

## 2014-12-28 DIAGNOSIS — I10 Essential (primary) hypertension: Secondary | ICD-10-CM | POA: Insufficient documentation

## 2014-12-28 DIAGNOSIS — E785 Hyperlipidemia, unspecified: Secondary | ICD-10-CM

## 2014-12-28 DIAGNOSIS — I2699 Other pulmonary embolism without acute cor pulmonale: Secondary | ICD-10-CM

## 2014-12-28 DIAGNOSIS — I5042 Chronic combined systolic (congestive) and diastolic (congestive) heart failure: Secondary | ICD-10-CM | POA: Insufficient documentation

## 2014-12-28 LAB — CBC WITH DIFFERENTIAL/PLATELET
BASOS ABS: 0.1 10*3/uL (ref 0.0–0.1)
Basophils Relative: 1 % (ref 0–1)
Eosinophils Absolute: 0.2 10*3/uL (ref 0.0–0.7)
Eosinophils Relative: 2 % (ref 0–5)
HEMATOCRIT: 34 % — AB (ref 39.0–52.0)
HEMOGLOBIN: 11.2 g/dL — AB (ref 13.0–17.0)
LYMPHS ABS: 1.9 10*3/uL (ref 0.7–4.0)
LYMPHS PCT: 16 % (ref 12–46)
MCH: 29.2 pg (ref 26.0–34.0)
MCHC: 32.9 g/dL (ref 30.0–36.0)
MCV: 88.8 fL (ref 78.0–100.0)
MPV: 11.2 fL (ref 8.6–12.4)
Monocytes Absolute: 1.3 10*3/uL — ABNORMAL HIGH (ref 0.1–1.0)
Monocytes Relative: 11 % (ref 3–12)
NEUTROS PCT: 70 % (ref 43–77)
Neutro Abs: 8.2 10*3/uL — ABNORMAL HIGH (ref 1.7–7.7)
PLATELETS: 385 10*3/uL (ref 150–400)
RBC: 3.83 MIL/uL — ABNORMAL LOW (ref 4.22–5.81)
RDW: 14.5 % (ref 11.5–15.5)
WBC: 11.7 10*3/uL — ABNORMAL HIGH (ref 4.0–10.5)

## 2014-12-28 LAB — BASIC METABOLIC PANEL
BUN: 19 mg/dL (ref 7–25)
CO2: 23 mmol/L (ref 20–31)
Calcium: 9.4 mg/dL (ref 8.6–10.3)
Chloride: 104 mmol/L (ref 98–110)
Creat: 1.3 mg/dL — ABNORMAL HIGH (ref 0.70–1.25)
GLUCOSE: 82 mg/dL (ref 65–99)
Potassium: 4.6 mmol/L (ref 3.5–5.3)
Sodium: 137 mmol/L (ref 135–146)

## 2014-12-28 MED ORDER — AMIODARONE HCL 200 MG PO TABS: ORAL_TABLET | ORAL | Status: DC

## 2014-12-28 NOTE — Progress Notes (Signed)
Addendum I reviewed his case with Dr. Martinique. Ideally, the patient should wait 3 months to be able to come off with Xarelto. By then, his inferior wall should have heel . He would also be able to start dual antiplatelet therapy  Again at that point. Therefore, if his symptoms remain stable, we will plan on waiting about 3 months prior to proceeding with staged PCI of the LAD. If his symptoms become unstable, PCI of LAD will have to be done sooner. Richardson Dopp, PA-C   12/28/2014 5:12 PM

## 2014-12-28 NOTE — Patient Instructions (Addendum)
Medication Instructions:  Your physician has recommended you make the following change in your medication:  1. Decrease Amiodarone ( 200 mg ) daily 2. Stop Asprin after January 07, 2015  Labwork: Your physician recommends that you have lab work today: bmet/cbc/bnp Your physician recommends that you return for a FASTING lipid profile: in 6 weeks: lipid/lft/tsh  Testing/Procedures: You will be having a doppler study at [facility] today to check right groin and right thigh for hematoma  Follow-Up: Your physician recommends that you keep your schedule a follow-up appointment with Richardson Dopp, PA-C in one week Your physician recommends that you schedule a follow-up appointment at [facility] in 3-4 weeks  Any Other Special Instructions Will Be Listed Below (If Applicable).  You are being referred to Cardiac Rehab at [facility], Some one will be contacting you schedule  If you need a refill on your cardiac medications before your next appointment, please call your pharmacy.

## 2014-12-29 ENCOUNTER — Telehealth: Payer: Self-pay | Admitting: *Deleted

## 2014-12-29 DIAGNOSIS — I2510 Atherosclerotic heart disease of native coronary artery without angina pectoris: Secondary | ICD-10-CM

## 2014-12-29 DIAGNOSIS — I5032 Chronic diastolic (congestive) heart failure: Secondary | ICD-10-CM

## 2014-12-29 LAB — BRAIN NATRIURETIC PEPTIDE: Brain Natriuretic Peptide: 709.5 pg/mL — ABNORMAL HIGH (ref 0.0–100.0)

## 2014-12-29 MED ORDER — FUROSEMIDE 20 MG PO TABS: 20.0000 mg | ORAL_TABLET | Freq: Every day | ORAL | Status: DC

## 2014-12-29 NOTE — Telephone Encounter (Signed)
Pt has been notified of ultrasound results no pseudaneurysm. Pt sais thank you.

## 2014-12-29 NOTE — Telephone Encounter (Signed)
Pt notified of lab results and findings by phone with verbal understanding. pt agreeable to starting Lasix 20 mg daily. Pt asked if he could get repeat labs when he see's Richardson Dopp, PAC 1/5, I said that will be fine.

## 2015-01-05 NOTE — Progress Notes (Signed)
Cardiology Office Note    Date:  01/06/2015   ID:  Ryan Ward, DOB [birth year], MRN [id number]  PCP:  No primary care provider on file.  Cardiologist:  Dr. Peter Martinique   Electrophysiologist:  n/a  Chief Complaint  Patient presents with  . Follow-up    Leg Hematoma; CHF    History of Present Illness:  Ryan Ward is a 70 y.o. male with a hx of HTN, HL, OSA. Admitted 12/2014 with inferior STEMI. Emergent cardiac catheterization was complicated by complete heart block and cardiogenic shock. He was managed with vasopressors and temporary pacemaker. LHC demonstrated severe 3 vessel CAD with an occluded RCA, severe proximal mid LAD disease and moderate to severe ostial LCx disease (small vessel). He underwent PCI with a DES to the RCA. It was initially thought he would need consideration for PCI of LAD after recovery from MI. Hospitalization was complicated by VF arrest post PCI related to pacemaker spike on T-wave.  EF was 35-45% by cardiac catheterization. Follow-up echo demonstrated EF 50-55%. He developed volume excess and required IV Lasix. Hospitalization was further c/b R posterior tibial and peroneal vein DVT. He was placed on heparin and transitioned to Xarelto; Brilinta was stopped and he was placed on Plavix. He developed atrial fibrillation and converted to NSR on IV diltiazem. CT scan confirmed pulmonary embolism; no R heart strain. Given acute pulmonary embolism, staged PCI to LAD was postponed.  He had recurrent atrial fibrillation and diltiazem was discontinued. He was placed on amiodarone.  Dry weight 194 pounds. Plan was to continue triple therapy and discontinue aspirin 30 days out from MI. He would then continue on Xarelto and Plavix. BP was soft and he was not placed on ACE inhibitor.  I saw him 12/26 for FU.  He noted DOE.  BNP was elevated and I put him on Lasix.  He had a hematoma on his R thigh and I got an [place].  This was neg for pseudoaneurysm.  He returns for follow-up.  He tells me that his right leg developed significant ecchymosis from his thigh down to his ankle. He had a significant amount of pain for several days as well as increased swelling. This seems to be improving. Breathing is slightly improved. He denies orthopnea or PND. Denies syncope. He has occasional dizziness. He has a nonproductive cough. Denies wheezing, fever.  Past Medical History  Diagnosis Date  . OSA (obstructive sleep apnea)     not using CPAP at home, does have machine  . Hypertension   . Hyperlipidemia   . STEMI (ST elevation myocardial infarction) ([place]) 12/07/2014    100% stenosis of RCA w/ PCI using DES. 85-95% stenosis in the LAD and will need future staged PCI of the lesion.   . Paroxysmal atrial fibrillation ([place]) 12/12/2014  . Pulmonary embolism ([place]) 12/12/2014    Past Surgical History  Procedure Laterality Date  . Hernia repair    . Cardiac catheterization N/A 12/07/2014    Procedure: Coronary Stent Intervention;  Surgeon: Peter M Martinique, MD;  Location: [facility];  Service: Cardiovascular;  Laterality: N/A;  STEMI  . Cardiac catheterization N/A 12/07/2014    Procedure: IABP Insertion;  Surgeon: Peter M Martinique, MD;  Location: [facility];  Service: Cardiovascular;  Laterality: N/A;  . Cardiac catheterization N/A 12/07/2014    Procedure: Temporary Pacemaker;  Surgeon: Peter M Martinique, MD;  Location: [facility];  Service: Cardiovascular;  Laterality: N/A;  . Cardiac catheterization N/A  12/07/2014    Procedure: Left Heart Cath and Coronary Angiography;  Surgeon: Peter M Martinique, MD;  Location: [facility];  Service: Cardiovascular;  Laterality: N/A;    Current Outpatient Prescriptions  Medication Sig Dispense Refill  . amiodarone (PACERONE) 200 MG tablet Take 200mg  (1 tablet) 60 tablet 6  . aspirin 81 MG chewable tablet Chew 1 tablet (81 mg total) by mouth daily. Quit taking on 01/07/2015.    [name] atorvastatin (LIPITOR) 80 MG tablet Take 1 tablet (80  mg total) by mouth daily at 6 PM. 30 tablet 6  . Cholecalciferol (VITAMIN D3) 5000 UNITS TABS Take 1 tablet by mouth daily.    . clopidogrel (PLAVIX) 75 MG tablet Take 1 tablet (75 mg total) by mouth daily. 30 tablet 11  . colchicine 0.6 MG tablet Take 1 tablet (0.6 mg total) by mouth daily. 30 tablet 3  . furosemide (LASIX) 20 MG tablet Take 1 tablet (20 mg total) by mouth daily. 30 tablet 11  . metoprolol (LOPRESSOR) 25 MG tablet Take 1 tablet (25 mg total) by mouth 2 (two) times daily. 60 tablet 11  . Multiple Vitamins-Minerals (CENTRUM SILVER) tablet Take 1 tablet by mouth daily.    . nitroGLYCERIN (NITROSTAT) 0.4 MG SL tablet Place 1 tablet (0.4 mg total) under the tongue every 5 (five) minutes x 3 doses as needed for chest pain. 25 tablet 0  . rivaroxaban (XARELTO) 20 MG TABS tablet Take 1 tablet (20 mg total) by mouth daily with supper. 30 tablet 10   No current facility-administered medications for this visit.    Allergies:   Review of patient's allergies indicates no known allergies.   Social History   Social History  . Marital Status: Married    Spouse Name: N/A  . Number of Children: N/A  . Years of Education: N/A   Social History Main Topics  . Smoking status: Never Smoker   . Smokeless tobacco: None  . Alcohol Use: No  . Drug Use: No  . Sexual Activity: Not Asked   Other Topics Concern  . None   Social History Narrative   Works with [name]     Family History:  The patient's family history includes Heart attack in his father.   ROS:   Please see the history of present illness.    ROS All other systems reviewed and are negative.   PHYSICAL EXAM:   VS:  BP 90/50 mmHg  Pulse 50  Ht 5\' 8"  (1.727 m)  Wt 206 lb 1.9 oz (93.495 kg)  BMI 31.35 kg/m2   GEN: Well nourished, well developed, in no acute distress HEENT: normal Neck: no JVD, no masses Cardiac: Normal S1/S2, RRR; no murmurs, rubs, or gallops, no edema;  R leg with diffuse ecchymosis posterior  thigh and calf area Respiratory:  clear to auscultation bilaterally; no wheezing, rhonchi or rales GI: soft, nontender, nondistended, + BS MS: no deformity or atrophy Skin: warm and dry, no rash Neuro:  Bilateral strength equal, no focal deficits  Psych: Alert and oriented x 3, normal affect  Wt Readings from Last 3 Encounters:  01/06/15 206 lb 1.9 oz (93.495 kg)  12/28/14 205 lb 9.6 oz (93.26 kg)  12/15/14 194 lb 12.8 oz (88.361 kg)      Studies/Labs Reviewed:   EKG:  EKG is  ordered today.  The ekg ordered today demonstrates sinus bradycardia, HR 50, inferior Q waves,  T-wave inversions in 2, 3, aVF, V6,  incomplete RBBB, QTc 448  ms, no change from prior tracing  Recent Labs: 12/07/2014: ALT 73*; Magnesium 1.9; TSH 2.802 12/09/2014: B Natriuretic Peptide 252.8* 12/28/2014: BUN 19; Creat 1.30*; Hemoglobin 11.2*; Platelets 385; Potassium 4.6; Sodium 137   Recent Lipid Panel    Component Value Date/Time   CHOL 148 12/08/2014 0113   TRIG 94 12/08/2014 0113   HDL 33* 12/08/2014 0113   CHOLHDL 4.5 12/08/2014 0113   VLDL 19 12/08/2014 0113   LDLCALC 96 12/08/2014 0113    Additional studies/ records that were reviewed today include:   R Groin [place] 12/28/14 Patent arteries and veins in the right groin, without pseudoaneurysm or AV fistula formation, and no evidence of Obstruction.  LE Vascular US 12/10/14 - Findings consistent with acute deep vein thrombosis involving the right posterior tibial and peroneal veins.  Ct Angio Chest Pe W/cm &/or Wo Cm  12/12/2014   IMPRESSION: Peripheral upper lobe pulmonary emboli, largest present in the anterior segment right upper lobe pulmonary artery. No more central pulmonary emboli. No evidence of right heart strain. Findings indicative of a degree of congestive heart failure. Specifically there are bilateral pleural effusions with interstitial edema. Consolidation in the bases is in part due to atelectasis. There may be a degree of early  pneumonia in the lung bases as well. No appreciable adenopathy. Critical Value/emergent results were called by telephone at the time of interpretation on 12/12/2014 at 10:10 am to Dr. Mertie Moores , who verbally acknowledged these results. Electronically Signed   By: Lowella Grip III M.D.   On: 12/12/2014 10:10    Echo 12/07/14 Inferior and posterior lateral HK, EF 50-55%  LHC 12/07/14 LM okay LAD proximal 85%, mid 95% LCx ostial 80% RCA proximal to mid 30%, mid to distal 40%, distal 100% EF 35-45% PCI: 4 x 20 mm Promus Premier DES to the distal RCA Conclusion: 1. Severe 3 vessel obstructive CAD  - 100% occlusion of large RCA distally with very heavy thrombus burden.  _ severe proximal and mid LAD disease  -- Moderate to severe ostial LCx disease- vessel is very small. 2. Moderate LV dysfunction with inferior wall motion abnormality. 3. Successful PCI of the distal RCA with DES and balloon angioplasty for thrombus. 4. Cardiogenic shock- IABP placed and patient started on pressors. 5. Complete heart block. Temporary pacemaker placed.       ASSESSMENT:    1. Coronary artery disease involving native coronary artery of native heart without angina pectoris   2. Ventricular fibrillation ([place])   3. Chronic diastolic CHF (congestive heart failure) (HCC)   4. Paroxysmal atrial fibrillation (HCC)   5. Other acute pulmonary embolism without acute cor pulmonale ([place])   6. Essential hypertension   7. Hyperlipidemia   8. Thigh hematoma, right, subsequent encounter     PLAN:  In order of problems listed above:  1. CAD - s/p Large inferior myocardial infarction complicated by cardiogenic shock and microcirculatory embolization at the time of PCI.Cath showed 100% stenosis of RCA with successful PCI using DES. Also had 85-95% stenosis in the LAD and will need future staged PCI of the lesion. He denies any further angina.Continue ASA, Plavix, Xarelto.  After 01/07/15, DC ASA and  continue Plavix, Xarelto.  After I saw him, I reviewed his case with Dr. Martinique. Ideally, the patient should wait 3 months to be able to come off Xarelto. By then, his inferior wall should have healed . He would also be able to start dual antiplatelet therapy again at that point. Therefore,  if his symptoms remain stable, we will plan on waiting about 3 months prior to proceeding with staged PCI of the LAD. If his symptoms become unstable, PCI of LAD will have to be done sooner.  He is due to return to work next week. I have asked him to remain out of work until he follows up on January 16. We can make further recommendations at that time.  2. Ventricular fibrillation - VF arrest post PCI.  Records indicate pacer spike on TW likely cause.  EF recovered by FU echo.  He was placed on Amiodarone for PAF.    3. Chronic diastolic CHF -  He still has some evidence of volume excess. I believe some of the swelling in his right leg is related to his DVT as well as ecchymosis from his hematoma. I will repeat a BMET and BNP today. If BNP remains elevated or is higher, I will adjust his Lasix further.  4. PAF - He reamins on Amiodarone and Xarelto.    5. Pulmonary Embolism - CTA on 12/12/2014 showed a peripheral upper lobe pulmonary emboli, largest present in the anterior segment right upper lobe pulmonary artery. No evidence of right heart strain.  LE dopplers confirmed RLE DVT.  He is on Xarelto 20 QD.     6. HTN - BP soft. He is not symptomatic.  Decrease Metoprolol to 25 mg bid.    7. Hyperlipidemia - Continue statin.     8. R thigh hematoma - The area that appeared to be a hematoma has resolved. His ultrasound was negative for pseudoaneurysm or AV fistula. However, he now has diffuse ecchymosis throughout his leg and some swelling. I reviewed this with Dr. Burt Knack. We will plan on a pelvic and right lower extremity noncontrast CT to rule out significant bleeding. Hopefully, this will improve after stopping his  aspirin.     Medication Adjustments/Labs and Tests Ordered: Current medicines are reviewed at length with the patient today.  Concerns regarding medicines are outlined above.  Medication changes, Labs and Tests ordered today are listed below. Patient Instructions  Medication Instructions:  1. DECREASE METOPROLOL TARTRATE TO 25 MG TWICE DAILY; NEW RX SENT IN  Labwork: TODAY BMET, CBC W/DIFF, BNP  Testing/Procedures: Non-Cardiac CT scanning, (CAT scanning) THIS IS A NON CONTRAST PELVIS CT; DX ECCHYMOSIS, R/O BLEEDINGis a noninvasive, special x-ray that produces cross-sectional images of the body using x-rays and a computer. CT scans help physicians diagnose and treat medical conditions. For some CT exams, a contrast material is used to enhance visibility in the area of the body being studied. CT scans provide greater clarity and reveal more details than regular x-ray exams.  Non-Cardiac CT scanning, (CAT scanning), THIS IS A CT OF THE LOWER EXTREMITY FOR THE RIGHT LEG.  is a noninvasive, special x-ray that produces cross-sectional images of the body using x-rays and a computer. CT scans help physicians diagnose and treat medical conditions. For some CT exams, a contrast material is used to enhance visibility in the area of the body being studied. CT scans provide greater clarity and reveal more details than regular x-ray exams.   Follow-Up: KEEP YOUR APPT WITH [name], [place] 01/17/15 AS PLANNED  Any Other Special Instructions Will Be Listed Below (If Applicable).   If you need a refill on your cardiac medications before your next appointment, please call your pharmacy.    Signed, Richardson Dopp, PA-C  01/06/2015 6:00 PM    [facility]  [address] [place], [place]  [address] Phone: [phone]; Fax: [phone]

## 2015-01-06 ENCOUNTER — Other Ambulatory Visit (INDEPENDENT_AMBULATORY_CARE_PROVIDER_SITE_OTHER): Payer: 59

## 2015-01-06 ENCOUNTER — Encounter: Payer: Self-pay | Admitting: Physician Assistant

## 2015-01-06 ENCOUNTER — Ambulatory Visit (INDEPENDENT_AMBULATORY_CARE_PROVIDER_SITE_OTHER)
Admission: RE | Admit: 2015-01-06 | Discharge: 2015-01-06 | Disposition: A | Discharge status: Home / Self Care | Payer: 59 | Source: Ambulatory Visit | Attending: Physician Assistant | Admitting: Physician Assistant

## 2015-01-06 ENCOUNTER — Ambulatory Visit (INDEPENDENT_AMBULATORY_CARE_PROVIDER_SITE_OTHER): Payer: 59 | Admitting: Physician Assistant

## 2015-01-06 ENCOUNTER — Telehealth: Payer: Self-pay | Admitting: *Deleted

## 2015-01-06 ENCOUNTER — Other Ambulatory Visit: Payer: Self-pay | Admitting: Physician Assistant

## 2015-01-06 VITALS — BP 90/50 | HR 50 | Ht 68.0 in | Wt 206.1 lb

## 2015-01-06 DIAGNOSIS — R600 Localized edema: Secondary | ICD-10-CM | POA: Diagnosis not present

## 2015-01-06 DIAGNOSIS — R58 Hemorrhage, not elsewhere classified: Secondary | ICD-10-CM

## 2015-01-06 DIAGNOSIS — I119 Hypertensive heart disease without heart failure: Secondary | ICD-10-CM

## 2015-01-06 DIAGNOSIS — I480 Paroxysmal atrial fibrillation: Secondary | ICD-10-CM

## 2015-01-06 DIAGNOSIS — I2699 Other pulmonary embolism without acute cor pulmonale: Secondary | ICD-10-CM

## 2015-01-06 DIAGNOSIS — I5032 Chronic diastolic (congestive) heart failure: Secondary | ICD-10-CM

## 2015-01-06 DIAGNOSIS — S8011XA Contusion of right lower leg, initial encounter: Secondary | ICD-10-CM | POA: Diagnosis not present

## 2015-01-06 DIAGNOSIS — I10 Essential (primary) hypertension: Secondary | ICD-10-CM | POA: Diagnosis not present

## 2015-01-06 DIAGNOSIS — I2510 Atherosclerotic heart disease of native coronary artery without angina pectoris: Secondary | ICD-10-CM

## 2015-01-06 DIAGNOSIS — Z7901 Long term (current) use of anticoagulants: Secondary | ICD-10-CM

## 2015-01-06 DIAGNOSIS — R0602 Shortness of breath: Secondary | ICD-10-CM

## 2015-01-06 DIAGNOSIS — E785 Hyperlipidemia, unspecified: Secondary | ICD-10-CM

## 2015-01-06 DIAGNOSIS — I4901 Ventricular fibrillation: Secondary | ICD-10-CM

## 2015-01-06 DIAGNOSIS — S7011XD Contusion of right thigh, subsequent encounter: Secondary | ICD-10-CM

## 2015-01-06 DIAGNOSIS — S7011XA Contusion of right thigh, initial encounter: Secondary | ICD-10-CM

## 2015-01-06 LAB — BASIC METABOLIC PANEL
BUN: 18 mg/dL (ref 7–25)
CHLORIDE: 102 mmol/L (ref 98–110)
CO2: 26 mmol/L (ref 20–31)
CREATININE: 1.14 mg/dL (ref 0.70–1.25)
Calcium: 9.3 mg/dL (ref 8.6–10.3)
Glucose, Bld: 91 mg/dL (ref 65–99)
Potassium: 4.2 mmol/L (ref 3.5–5.3)
SODIUM: 138 mmol/L (ref 135–146)

## 2015-01-06 MED ORDER — METOPROLOL TARTRATE 25 MG PO TABS: 25.0000 mg | ORAL_TABLET | Freq: Two times a day (BID) | ORAL | Status: DC

## 2015-01-06 NOTE — Addendum Note (Signed)
Addended by: Maxim Bedel K on: 01/06/2015 10:16 AM   Modules accepted: Orders  

## 2015-01-06 NOTE — Telephone Encounter (Signed)
Pt has been notified of all CT results ; no bleeding. Pt verbalized understanding to results given today.

## 2015-01-06 NOTE — Patient Instructions (Addendum)
Medication Instructions:  1. DECREASE METOPROLOL TARTRATE TO 25 MG TWICE DAILY; NEW RX SENT IN  Labwork: TODAY BMET, CBC W/DIFF, BNP  Testing/Procedures: Non-Cardiac CT scanning, (CAT scanning) THIS IS A NON CONTRAST PELVIS CT; DX ECCHYMOSIS, R/O BLEEDINGis a noninvasive, special x-ray that produces cross-sectional images of the body using x-rays and a computer. CT scans help physicians diagnose and treat medical conditions. For some CT exams, a contrast material is used to enhance visibility in the area of the body being studied. CT scans provide greater clarity and reveal more details than regular x-ray exams.  Non-Cardiac CT scanning, (CAT scanning), THIS IS A CT OF THE LOWER EXTREMITY FOR THE RIGHT LEG.  is a noninvasive, special x-ray that produces cross-sectional images of the body using x-rays and a computer. CT scans help physicians diagnose and treat medical conditions. For some CT exams, a contrast material is used to enhance visibility in the area of the body being studied. CT scans provide greater clarity and reveal more details than regular x-ray exams.   Follow-Up: KEEP YOUR APPT WITH [name], [place] 01/17/15 AS PLANNED  Any Other Special Instructions Will Be Listed Below (If Applicable).   If you need a refill on your cardiac medications before your next appointment, please call your pharmacy.

## 2015-01-07 ENCOUNTER — Telehealth: Payer: Self-pay | Admitting: *Deleted

## 2015-01-07 DIAGNOSIS — I5032 Chronic diastolic (congestive) heart failure: Secondary | ICD-10-CM

## 2015-01-07 DIAGNOSIS — I2510 Atherosclerotic heart disease of native coronary artery without angina pectoris: Secondary | ICD-10-CM

## 2015-01-07 LAB — CBC WITH DIFFERENTIAL/PLATELET
Basophils Absolute: 0.1 10*3/uL (ref 0.0–0.1)
Basophils Relative: 1 % (ref 0–1)
Eosinophils Absolute: 0.4 10*3/uL (ref 0.0–0.7)
Eosinophils Relative: 4 % (ref 0–5)
HCT: 33.5 % — ABNORMAL LOW (ref 39.0–52.0)
HEMOGLOBIN: 11.1 g/dL — AB (ref 13.0–17.0)
LYMPHS ABS: 1.5 10*3/uL (ref 0.7–4.0)
Lymphocytes Relative: 16 % (ref 12–46)
MCH: 29.6 pg (ref 26.0–34.0)
MCHC: 33.1 g/dL (ref 30.0–36.0)
MCV: 89.3 fL (ref 78.0–100.0)
MONOS PCT: 12 % (ref 3–12)
MPV: 10.8 fL (ref 8.6–12.4)
Monocytes Absolute: 1.1 10*3/uL — ABNORMAL HIGH (ref 0.1–1.0)
NEUTROS ABS: 6.2 10*3/uL (ref 1.7–7.7)
NEUTROS PCT: 67 % (ref 43–77)
Platelets: 285 10*3/uL (ref 150–400)
RBC: 3.75 MIL/uL — ABNORMAL LOW (ref 4.22–5.81)
RDW: 15.9 % — ABNORMAL HIGH (ref 11.5–15.5)
WBC: 9.2 10*3/uL (ref 4.0–10.5)

## 2015-01-07 LAB — BRAIN NATRIURETIC PEPTIDE: Brain Natriuretic Peptide: 718.9 pg/mL — ABNORMAL HIGH (ref 0.0–100.0)

## 2015-01-07 MED ORDER — FUROSEMIDE 40 MG PO TABS: 40.0000 mg | ORAL_TABLET | ORAL | Status: DC

## 2015-01-07 MED ORDER — POTASSIUM CHLORIDE ER 10 MEQ PO TBCR: 10.0000 meq | EXTENDED_RELEASE_TABLET | ORAL | Status: DC

## 2015-01-07 NOTE — Telephone Encounter (Signed)
Pt notified of lab results and medication changes. Bmet, Bnp 1/12, keep appt w/Luke [place], [place] 1/16. Start K+ 20 meq x 3 days, then decrease 10 meq daily, increase lasix to 60 mg daily x 3 then decrease to 40 mg daily. Advised monitor wt and cb if wt is up 3 lb's x 1 day or increased SOB or increased EDEMA. Pt agreeable to plan of care with verbal read back on all instructions and changes.

## 2015-01-11 ENCOUNTER — Telehealth (HOSPITAL_COMMUNITY): Payer: Self-pay | Admitting: *Deleted

## 2015-01-12 ENCOUNTER — Telehealth: Payer: Self-pay

## 2015-01-12 NOTE — Telephone Encounter (Signed)
Received a call from patient.Stated he just received automated call from our office.Stated he thought it was to remind him of lab appointment 01/13/15.Advised he is scheduled 1/12 to have bmet,bnp.Advised he has appointment with [name] PA 01/17/15 at 10:00 am.

## 2015-01-13 ENCOUNTER — Other Ambulatory Visit (INDEPENDENT_AMBULATORY_CARE_PROVIDER_SITE_OTHER): Payer: 59

## 2015-01-13 ENCOUNTER — Ambulatory Visit (HOSPITAL_COMMUNITY): Payer: Medicare Other

## 2015-01-13 DIAGNOSIS — I5032 Chronic diastolic (congestive) heart failure: Secondary | ICD-10-CM

## 2015-01-13 LAB — BASIC METABOLIC PANEL
BUN: 21 mg/dL (ref 7–25)
CALCIUM: 8.9 mg/dL (ref 8.6–10.3)
CO2: 25 mmol/L (ref 20–31)
Chloride: 105 mmol/L (ref 98–110)
Creat: 1.02 mg/dL (ref 0.70–1.25)
Glucose, Bld: 109 mg/dL — ABNORMAL HIGH (ref 65–99)
POTASSIUM: 4 mmol/L (ref 3.5–5.3)
SODIUM: 139 mmol/L (ref 135–146)

## 2015-01-13 LAB — BRAIN NATRIURETIC PEPTIDE: BRAIN NATRIURETIC PEPTIDE: 566.1 pg/mL — AB (ref 0.0–100.0)

## 2015-01-14 ENCOUNTER — Telehealth: Payer: Self-pay | Admitting: *Deleted

## 2015-01-14 NOTE — Telephone Encounter (Signed)
Pt notified of lab results by phone with verbal understanding.  

## 2015-01-17 ENCOUNTER — Ambulatory Visit (INDEPENDENT_AMBULATORY_CARE_PROVIDER_SITE_OTHER): Payer: 59 | Admitting: Cardiology

## 2015-01-17 ENCOUNTER — Encounter: Payer: Self-pay | Admitting: Cardiology

## 2015-01-17 VITALS — BP 90/62 | Ht 68.0 in | Wt 198.7 lb

## 2015-01-17 DIAGNOSIS — I2111 ST elevation (STEMI) myocardial infarction involving right coronary artery: Secondary | ICD-10-CM

## 2015-01-17 DIAGNOSIS — I2699 Other pulmonary embolism without acute cor pulmonale: Secondary | ICD-10-CM | POA: Diagnosis not present

## 2015-01-17 DIAGNOSIS — E785 Hyperlipidemia, unspecified: Secondary | ICD-10-CM

## 2015-01-17 DIAGNOSIS — I10 Essential (primary) hypertension: Secondary | ICD-10-CM

## 2015-01-17 DIAGNOSIS — I4901 Ventricular fibrillation: Secondary | ICD-10-CM | POA: Diagnosis not present

## 2015-01-17 DIAGNOSIS — I2510 Atherosclerotic heart disease of native coronary artery without angina pectoris: Secondary | ICD-10-CM

## 2015-01-17 MED ORDER — METOPROLOL TARTRATE 25 MG PO TABS: 12.5000 mg | ORAL_TABLET | Freq: Two times a day (BID) | ORAL | Status: DC

## 2015-01-17 NOTE — Assessment & Plan Note (Signed)
Pacer spike on T-wave resulting in VF peri-MI

## 2015-01-17 NOTE — Assessment & Plan Note (Signed)
Peri-MI. NSR on Amiodarone

## 2015-01-17 NOTE — Progress Notes (Signed)
01/17/2015 Sima Matas   02-May-1945  [id number]  Primary Physician Pcp Not In System Primary Cardiologist: Dr Martinique  HPI:  70 y/o male who presented with an acute inferior MI in Dec. This was complicated by shock, CHB, PAF, PE/DVT, and VT secondary to pacer spike on TW. He received a DES to his RCA. He has residual high garde LAD disease and normal LVF. He had been on triple therapy till 01/07/15 when his ASA was stopped. He says he has been getting stronger and wants to return to work. 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 He says he will not have to do strenuous activity. He denies any chest pain, syncope or near syncope, or unusual dyspnea.   Current Outpatient Prescriptions  Medication Sig Dispense Refill  . amiodarone (PACERONE) 200 MG tablet Take 200mg  (1 tablet) 60 tablet 6  . aspirin 81 MG chewable tablet Chew 1 tablet (81 mg total) by mouth daily. Quit taking on 01/07/2015.    [name] atorvastatin (LIPITOR) 80 MG tablet Take 1 tablet (80 mg total) by mouth daily at 6 PM. 30 tablet 6  . Cholecalciferol (VITAMIN D3) 5000 UNITS TABS Take 1 tablet by mouth daily.    . clopidogrel (PLAVIX) 75 MG tablet Take 1 tablet (75 mg total) by mouth daily. 30 tablet 11  . colchicine 0.6 MG tablet Take 1 tablet (0.6 mg total) by mouth daily. 30 tablet 3  . furosemide (LASIX) 40 MG tablet Take 1 tablet (40 mg total) by mouth as directed. Take 40 mg with 20 mg lasix to = 60 mg x 3 days; then go to lasix 40 mg daily 45 tablet 11  . metoprolol tartrate (LOPRESSOR) 25 MG tablet Take 0.5 tablets (12.5 mg total) by mouth 2 (two) times daily. 60 tablet 0  . Multiple Vitamins-Minerals (CENTRUM SILVER) tablet Take 1 tablet by mouth daily.    . nitroGLYCERIN (NITROSTAT) 0.4 MG SL tablet Place 1 tablet (0.4 mg total) under the tongue every 5 (five) minutes x 3 doses as needed for chest pain. 25 tablet 0  . potassium chloride (K-DUR) 10 MEQ tablet Take 1 tablet (10 mEq total) by mouth as directed. Take 20 meq daily x  3 days then go to 10 meq daily 45 tablet 11  . rivaroxaban (XARELTO) 20 MG TABS tablet Take 1 tablet (20 mg total) by mouth daily with supper. 30 tablet 10   No current facility-administered medications for this visit.    No Known Allergies  Social History   Social History  . Marital Status: Married    Spouse Name: N/A  . Number of Children: N/A  . Years of Education: N/A   Occupational History  . Not on file.   Social History Main Topics  . Smoking status: Never Smoker   . Smokeless tobacco: Not on file  . Alcohol Use: No  . Drug Use: No  . Sexual Activity: Not on file   Other Topics Concern  . Not on file   Social History Narrative   Works with [name]     Review of Systems: General: negative for chills, fever, night sweats or weight changes.  Cardiovascular: negative for chest pain, dyspnea on exertion, edema, orthopnea, palpitations, paroxysmal nocturnal dyspnea or shortness of breath Dermatological: negative for rash Respiratory: negative for cough or wheezing Urologic: negative for hematuria Abdominal: negative for nausea, vomiting, diarrhea, bright red blood per rectum, melena, or hematemesis Neurologic: negative for visual changes, syncope, or dizziness All other  systems reviewed and are otherwise negative except as noted above.    Blood pressure 90/62, height 5\' 8"  (1.727 m), weight 198 lb 11.2 oz (90.13 kg).  General appearance: alert, cooperative, no distress and pale Neck: no carotid bruit and no JVD Lungs: clear to auscultation bilaterally Extremities: trace RLE edema Skin: Skin color, texture, turgor normal. No rashes or lesions Neurologic: Grossly normal  EKG NSR SB (54) incomplete RBBB, inferior Qs and TWI  ASSESSMENT AND PLAN:   ST elevation (STEMI) myocardial infarction involving right coronary artery ([place]) 123XX123 complicated by shock and CHB. No further angina.  CAD S/P percutaneous coronary angioplasty Totally occluded RCA distal  segment treated with DES with good result, complicated by micro-embolization. Residual coronary disease includes 90% high-grade calcified ostial to proximal LAD, and high-grade 90% focal mid LAD bifurcation lesion. Also ostial circumflex 80% stenosis. Circumflex territory is very small echo probably be treated with medical therapy.  Postprocedure echo EF greater than 50%.  Paroxysmal atrial fibrillation (HCC) Peri-MI. NSR on Amiodarone  Ventricular fibrillation (HCC) Pacer spike on T-wave resulting in VF peri-MI  Pulmonary embolism without acute cor pulmonale (HCC) Post MI, RLE DVT and PE- on Xarelto  Hyperlipidemia On high dose statin   PLAN  I told Mr Gupte he could return to work if he promised he would not do anything strenuous. He admitted that he had been carrying cinder blocks this weekend and I discouraged that type of strenuous activity, though he say he no chest pain or dyspnea with this. His B/P is 90 systolic and HR 54. His Amiodarone is now fully loaded. I cut his Metoprolol back to 12.5 mg BID. He should see Dr Martinique in about 6 weeks to set up his staged LAD intervention. He should have his lipids and LFTs checked then as well.   [name] K PA-C 01/17/2015 10:38 AM

## 2015-01-17 NOTE — Assessment & Plan Note (Signed)
Post MI, RLE DVT and PE- on Xarelto

## 2015-01-17 NOTE — Assessment & Plan Note (Signed)
On high dose statin.

## 2015-01-17 NOTE — Assessment & Plan Note (Addendum)
123XX123 complicated by shock and CHB. No further angina.

## 2015-01-17 NOTE — Patient Instructions (Signed)
Your physician has recommended you make the following change in your medication: DECREASE METOPROLOL TO 25MG  - TAKE 1/2 TABLET TWICE DAILY  YOU MAY RETURN TO WORK WITHOUT STRENUOUS ACTIVITY.  Your physician recommends that you schedule a follow-up appointment in: LATE February WITH DR. Martinique

## 2015-01-17 NOTE — Assessment & Plan Note (Signed)
Totally occluded RCA distal segment treated with DES with good result, complicated by micro-embolization. Residual coronary disease includes 90% high-grade calcified ostial to proximal LAD, and high-grade 90% focal mid LAD bifurcation lesion. Also ostial circumflex 80% stenosis. Circumflex territory is very small echo probably be treated with medical therapy.  Postprocedure echo EF greater than 50%.

## 2015-01-19 ENCOUNTER — Ambulatory Visit (HOSPITAL_COMMUNITY): Payer: Medicare Other

## 2015-01-21 ENCOUNTER — Ambulatory Visit (HOSPITAL_COMMUNITY): Payer: Medicare Other

## 2015-01-21 ENCOUNTER — Other Ambulatory Visit: Payer: Self-pay | Admitting: Student

## 2015-01-24 ENCOUNTER — Ambulatory Visit (HOSPITAL_COMMUNITY): Payer: Medicare Other

## 2015-01-26 ENCOUNTER — Ambulatory Visit (HOSPITAL_COMMUNITY): Payer: Medicare Other

## 2015-01-27 ENCOUNTER — Encounter (HOSPITAL_COMMUNITY)
Admission: RE | Admit: 2015-01-27 | Discharge: 2015-01-27 | Disposition: A | Discharge status: Home / Self Care | Payer: 59 | Source: Ambulatory Visit | Attending: Cardiology | Admitting: Cardiology

## 2015-01-27 NOTE — Progress Notes (Signed)
Cardiac Rehab Medication Review by a Pharmacist  Does the patient  feel that his/her medications are working for him/her?  yes  Has the patient been experiencing any side effects to the medications prescribed?  yes  Does the patient measure his/her own blood pressure or blood glucose at home?  no   Does the patient have any problems obtaining medications due to transportation or finances?   no  Understanding of regimen: good Understanding of indications: good Potential of compliance: excellent    Pharmacist comments: Experiencing some myalgia with atorvastatin however, states pain is manageable and improving.   Judieth Keens, PharmD. 01/27/2015 8:05 AM

## 2015-01-28 ENCOUNTER — Ambulatory Visit (HOSPITAL_COMMUNITY): Payer: Medicare Other

## 2015-01-31 ENCOUNTER — Encounter (HOSPITAL_COMMUNITY): Payer: 59

## 2015-01-31 ENCOUNTER — Ambulatory Visit (HOSPITAL_COMMUNITY): Payer: Medicare Other

## 2015-02-02 ENCOUNTER — Encounter (HOSPITAL_COMMUNITY)
Admission: RE | Admit: 2015-02-02 | Discharge: 2015-02-02 | Disposition: A | Discharge status: Home / Self Care | Payer: 59 | Source: Ambulatory Visit | Attending: Cardiology | Admitting: Cardiology

## 2015-02-02 ENCOUNTER — Encounter (HOSPITAL_COMMUNITY): Payer: Self-pay

## 2015-02-02 ENCOUNTER — Encounter (HOSPITAL_COMMUNITY): Payer: 59

## 2015-02-02 ENCOUNTER — Ambulatory Visit (HOSPITAL_COMMUNITY): Payer: Medicare Other

## 2015-02-02 DIAGNOSIS — I213 ST elevation (STEMI) myocardial infarction of unspecified site: Secondary | ICD-10-CM | POA: Insufficient documentation

## 2015-02-02 DIAGNOSIS — Z955 Presence of coronary angioplasty implant and graft: Secondary | ICD-10-CM | POA: Insufficient documentation

## 2015-02-02 NOTE — Progress Notes (Signed)
Pt started cardiac rehab today.  Pt tolerated light exercise without difficulty. VSS, telemetry-sinus bradycardia, HR-51.  , asymptomatic.  Medication list reconciled.  Pt verbalized compliance with medications and denies barriers to compliance. PSYCHOSOCIAL ASSESSMENT:  PHQ-0. Pt exhibits positive coping skills, hopeful outlook with supportive family. No psychosocial needs identified at this time, no psychosocial interventions necessary.    Pt enjoys minor repair work on cars, yard work and his career at [name].   Pt cardiac rehab  goal is  to restore his health and increase strength and stamina. Pt has experienced significant decrease in his functional ability.  Pt encouraged to participate in home exercise in addition to cardiac rehab activities as well as lifestyle modification education classes to increase ability to achieve these goals. Pt oriented to exercise equipment and routine.  Understanding verbalized.

## 2015-02-04 ENCOUNTER — Encounter (HOSPITAL_COMMUNITY): Payer: 59

## 2015-02-04 ENCOUNTER — Ambulatory Visit (HOSPITAL_COMMUNITY): Payer: Medicare Other

## 2015-02-04 ENCOUNTER — Encounter (HOSPITAL_COMMUNITY)
Admission: RE | Admit: 2015-02-04 | Discharge: 2015-02-04 | Disposition: A | Discharge status: Home / Self Care | Payer: 59 | Source: Ambulatory Visit | Attending: Cardiology | Admitting: Cardiology

## 2015-02-04 DIAGNOSIS — I213 ST elevation (STEMI) myocardial infarction of unspecified site: Secondary | ICD-10-CM | POA: Diagnosis not present

## 2015-02-06 ENCOUNTER — Other Ambulatory Visit: Payer: Self-pay | Admitting: Student

## 2015-02-07 ENCOUNTER — Encounter (HOSPITAL_COMMUNITY)
Admission: RE | Admit: 2015-02-07 | Discharge: 2015-02-07 | Disposition: A | Discharge status: Home / Self Care | Payer: 59 | Source: Ambulatory Visit | Attending: Cardiology | Admitting: Cardiology

## 2015-02-07 ENCOUNTER — Encounter (HOSPITAL_COMMUNITY): Payer: 59

## 2015-02-07 ENCOUNTER — Ambulatory Visit (HOSPITAL_COMMUNITY): Payer: Medicare Other

## 2015-02-07 DIAGNOSIS — I213 ST elevation (STEMI) myocardial infarction of unspecified site: Secondary | ICD-10-CM | POA: Diagnosis not present

## 2015-02-07 NOTE — Telephone Encounter (Signed)
Rx request sent to pharmacy.  

## 2015-02-09 ENCOUNTER — Encounter (HOSPITAL_COMMUNITY): Payer: 59

## 2015-02-09 ENCOUNTER — Ambulatory Visit (HOSPITAL_COMMUNITY): Payer: Medicare Other

## 2015-02-09 ENCOUNTER — Encounter (HOSPITAL_COMMUNITY)
Admission: RE | Admit: 2015-02-09 | Discharge: 2015-02-09 | Disposition: A | Discharge status: Home / Self Care | Payer: 59 | Source: Ambulatory Visit | Attending: Cardiology | Admitting: Cardiology

## 2015-02-09 DIAGNOSIS — I213 ST elevation (STEMI) myocardial infarction of unspecified site: Secondary | ICD-10-CM | POA: Diagnosis not present

## 2015-02-09 NOTE — Progress Notes (Signed)
Reviewed home exercise with on 02/07/15.  Pt plans to walk or use elliptical that he has at home for exercise.  Reviewed THR, pulse, RPE, sign and symptoms, NTG use, and when to call 911 or MD.  Pt voiced understanding.   Hazelyn Kallen [employer]

## 2015-02-11 ENCOUNTER — Ambulatory Visit (HOSPITAL_COMMUNITY): Payer: Medicare Other

## 2015-02-11 ENCOUNTER — Encounter (HOSPITAL_COMMUNITY)
Admission: RE | Admit: 2015-02-11 | Discharge: 2015-02-11 | Disposition: A | Discharge status: Home / Self Care | Payer: 59 | Source: Ambulatory Visit | Attending: Cardiology | Admitting: Cardiology

## 2015-02-11 ENCOUNTER — Encounter (HOSPITAL_COMMUNITY): Payer: 59

## 2015-02-11 DIAGNOSIS — I213 ST elevation (STEMI) myocardial infarction of unspecified site: Secondary | ICD-10-CM | POA: Diagnosis not present

## 2015-02-14 ENCOUNTER — Encounter (HOSPITAL_COMMUNITY): Payer: 59

## 2015-02-14 ENCOUNTER — Encounter (HOSPITAL_COMMUNITY)
Admission: RE | Admit: 2015-02-14 | Discharge: 2015-02-14 | Disposition: A | Discharge status: Home / Self Care | Payer: 59 | Source: Ambulatory Visit | Attending: Cardiology | Admitting: Cardiology

## 2015-02-14 ENCOUNTER — Ambulatory Visit (HOSPITAL_COMMUNITY): Payer: Medicare Other

## 2015-02-14 DIAGNOSIS — I213 ST elevation (STEMI) myocardial infarction of unspecified site: Secondary | ICD-10-CM | POA: Diagnosis not present

## 2015-02-16 ENCOUNTER — Ambulatory Visit (HOSPITAL_COMMUNITY): Payer: Medicare Other

## 2015-02-16 ENCOUNTER — Encounter (HOSPITAL_COMMUNITY)
Admission: RE | Admit: 2015-02-16 | Discharge: 2015-02-16 | Disposition: A | Discharge status: Home / Self Care | Payer: 59 | Source: Ambulatory Visit | Attending: Cardiology | Admitting: Cardiology

## 2015-02-16 ENCOUNTER — Encounter (HOSPITAL_COMMUNITY): Payer: 59

## 2015-02-16 DIAGNOSIS — I213 ST elevation (STEMI) myocardial infarction of unspecified site: Secondary | ICD-10-CM | POA: Diagnosis not present

## 2015-02-17 ENCOUNTER — Other Ambulatory Visit (INDEPENDENT_AMBULATORY_CARE_PROVIDER_SITE_OTHER): Payer: 59 | Admitting: *Deleted

## 2015-02-17 DIAGNOSIS — I10 Essential (primary) hypertension: Secondary | ICD-10-CM

## 2015-02-17 DIAGNOSIS — I4901 Ventricular fibrillation: Secondary | ICD-10-CM

## 2015-02-17 DIAGNOSIS — R0602 Shortness of breath: Secondary | ICD-10-CM

## 2015-02-17 DIAGNOSIS — I2699 Other pulmonary embolism without acute cor pulmonale: Secondary | ICD-10-CM

## 2015-02-17 DIAGNOSIS — S7011XA Contusion of right thigh, initial encounter: Secondary | ICD-10-CM

## 2015-02-17 DIAGNOSIS — I5032 Chronic diastolic (congestive) heart failure: Secondary | ICD-10-CM | POA: Diagnosis not present

## 2015-02-17 DIAGNOSIS — E785 Hyperlipidemia, unspecified: Secondary | ICD-10-CM

## 2015-02-17 DIAGNOSIS — I2510 Atherosclerotic heart disease of native coronary artery without angina pectoris: Secondary | ICD-10-CM | POA: Diagnosis not present

## 2015-02-17 DIAGNOSIS — I480 Paroxysmal atrial fibrillation: Secondary | ICD-10-CM | POA: Diagnosis not present

## 2015-02-17 LAB — LIPID PANEL
Cholesterol: 105 mg/dL — ABNORMAL LOW (ref 125–200)
HDL: 32 mg/dL — ABNORMAL LOW (ref >=40)
LDL CALC: 61 mg/dL (ref <130)
TRIGLYCERIDES: 60 mg/dL (ref <150)
Total CHOL/HDL Ratio: 3.3 Ratio (ref <=5.0)
VLDL: 12 mg/dL (ref <30)

## 2015-02-17 LAB — HEPATIC FUNCTION PANEL
ALT: 55 U/L — ABNORMAL HIGH (ref 9–46)
AST: 37 U/L — ABNORMAL HIGH (ref 10–35)
Albumin: 3.8 g/dL (ref 3.6–5.1)
Alkaline Phosphatase: 95 U/L (ref 40–115)
BILIRUBIN INDIRECT: 0.3 mg/dL (ref 0.2–1.2)
Bilirubin, Direct: 0.2 mg/dL (ref <=0.2)
TOTAL PROTEIN: 5.9 g/dL — AB (ref 6.1–8.1)
Total Bilirubin: 0.5 mg/dL (ref 0.2–1.2)

## 2015-02-17 LAB — TSH: TSH: 4.55 mIU/L — ABNORMAL HIGH (ref 0.40–4.50)

## 2015-02-17 NOTE — Addendum Note (Signed)
Addended by: Eulis Foster on: 02/17/2015 09:46 AM   Modules accepted: Orders

## 2015-02-18 ENCOUNTER — Encounter (HOSPITAL_COMMUNITY)
Admission: RE | Admit: 2015-02-18 | Discharge: 2015-02-18 | Disposition: A | Discharge status: Home / Self Care | Payer: 59 | Source: Ambulatory Visit | Attending: Cardiology | Admitting: Cardiology

## 2015-02-18 ENCOUNTER — Encounter (HOSPITAL_COMMUNITY): Payer: 59

## 2015-02-18 ENCOUNTER — Ambulatory Visit (HOSPITAL_COMMUNITY): Payer: Medicare Other

## 2015-02-18 DIAGNOSIS — I213 ST elevation (STEMI) myocardial infarction of unspecified site: Secondary | ICD-10-CM | POA: Diagnosis not present

## 2015-02-21 ENCOUNTER — Other Ambulatory Visit: Payer: Self-pay

## 2015-02-21 ENCOUNTER — Encounter (HOSPITAL_COMMUNITY): Payer: 59

## 2015-02-21 ENCOUNTER — Ambulatory Visit (HOSPITAL_COMMUNITY): Payer: Medicare Other

## 2015-02-21 DIAGNOSIS — R740 Nonspecific elevation of levels of transaminase and lactic acid dehydrogenase [LDH]: Secondary | ICD-10-CM

## 2015-02-21 DIAGNOSIS — R7989 Other specified abnormal findings of blood chemistry: Secondary | ICD-10-CM

## 2015-02-21 DIAGNOSIS — R7401 Elevation of levels of liver transaminase levels: Secondary | ICD-10-CM

## 2015-02-23 ENCOUNTER — Encounter (HOSPITAL_COMMUNITY): Payer: 59

## 2015-02-23 ENCOUNTER — Ambulatory Visit (HOSPITAL_COMMUNITY): Payer: Medicare Other

## 2015-02-25 ENCOUNTER — Encounter (HOSPITAL_COMMUNITY): Payer: 59

## 2015-02-25 ENCOUNTER — Ambulatory Visit (HOSPITAL_COMMUNITY): Payer: Medicare Other

## 2015-02-25 ENCOUNTER — Encounter (HOSPITAL_COMMUNITY)
Admission: RE | Admit: 2015-02-25 | Discharge: 2015-02-25 | Disposition: A | Discharge status: Home / Self Care | Payer: 59 | Source: Ambulatory Visit | Attending: Cardiology | Admitting: Cardiology

## 2015-02-25 DIAGNOSIS — I213 ST elevation (STEMI) myocardial infarction of unspecified site: Secondary | ICD-10-CM | POA: Diagnosis not present

## 2015-02-25 NOTE — Progress Notes (Signed)
During first station, pt noted to have pink eyes with drainage.  Pt reported that over the last couple of days he has has eye drainage and used "salt water" solution to bathe his eye. Pt advised to stop exercise.  Appointment made for pt to be seen by his primary MD office staff - Dr Claris Gower at [facility]. Pt appt is at 10:15.  Advised pt on general eye care guidelines to prevent further reinfection or cross containment. Pt verbalized understanding and is in agreement of this. Cherre Huger, BSN

## 2015-02-28 ENCOUNTER — Encounter (HOSPITAL_COMMUNITY): Payer: 59

## 2015-02-28 ENCOUNTER — Ambulatory Visit (HOSPITAL_COMMUNITY): Payer: Medicare Other

## 2015-03-02 ENCOUNTER — Ambulatory Visit (HOSPITAL_COMMUNITY): Payer: Medicare Other

## 2015-03-02 ENCOUNTER — Encounter (HOSPITAL_COMMUNITY): Payer: 59

## 2015-03-02 ENCOUNTER — Encounter (HOSPITAL_COMMUNITY)
Admission: RE | Admit: 2015-03-02 | Discharge: 2015-03-02 | Disposition: A | Discharge status: Home / Self Care | Payer: 59 | Source: Ambulatory Visit | Attending: Cardiology | Admitting: Cardiology

## 2015-03-02 DIAGNOSIS — I213 ST elevation (STEMI) myocardial infarction of unspecified site: Secondary | ICD-10-CM | POA: Insufficient documentation

## 2015-03-02 DIAGNOSIS — Z955 Presence of coronary angioplasty implant and graft: Secondary | ICD-10-CM | POA: Insufficient documentation

## 2015-03-04 ENCOUNTER — Encounter (HOSPITAL_COMMUNITY)
Admission: RE | Admit: 2015-03-04 | Discharge: 2015-03-04 | Disposition: A | Discharge status: Home / Self Care | Payer: 59 | Source: Ambulatory Visit | Attending: Cardiology | Admitting: Cardiology

## 2015-03-04 ENCOUNTER — Encounter (HOSPITAL_COMMUNITY): Payer: 59

## 2015-03-04 ENCOUNTER — Ambulatory Visit (HOSPITAL_COMMUNITY): Payer: Medicare Other

## 2015-03-04 DIAGNOSIS — Z955 Presence of coronary angioplasty implant and graft: Secondary | ICD-10-CM | POA: Diagnosis present

## 2015-03-04 DIAGNOSIS — I213 ST elevation (STEMI) myocardial infarction of unspecified site: Secondary | ICD-10-CM | POA: Diagnosis present

## 2015-03-07 ENCOUNTER — Encounter (HOSPITAL_COMMUNITY): Payer: 59

## 2015-03-07 ENCOUNTER — Ambulatory Visit (HOSPITAL_COMMUNITY): Payer: Medicare Other

## 2015-03-07 ENCOUNTER — Encounter (HOSPITAL_COMMUNITY)
Admission: RE | Admit: 2015-03-07 | Discharge: 2015-03-07 | Disposition: A | Discharge status: Home / Self Care | Payer: 59 | Source: Ambulatory Visit | Attending: Cardiology | Admitting: Cardiology

## 2015-03-07 DIAGNOSIS — I213 ST elevation (STEMI) myocardial infarction of unspecified site: Secondary | ICD-10-CM | POA: Diagnosis not present

## 2015-03-07 NOTE — Progress Notes (Signed)
Daily Session Note  Patient Details  Name: Ryan Ward MRN: [id number] Date of Birth: 22-Feb-1945 Referring Provider:  No ref. provider found  Encounter Date: 03/07/2015  Check In:     Session Check In - 03/07/15 0821    Check-In   Location [facility]   Staff Present Maurice Small, RN, BSN;Amber Fair, MS, ACSM RCEP, Exercise Physiologist;Joann Rion, RN, Levie Heritage, MA, ACSM RCEP, Exercise Physiologist   Supervising physician immediately available to respond to emergencies [facility] immediately available   Physician(s) Dr. Marily Memos   Medication changes reported     No   Fall or balance concerns reported    No   Warm-up and Cool-down Performed as group-led instruction   Resistance Training Performed Yes   VAD Patient? No   Pain Assessment   Currently in Pain? No/denies      Capillary Blood Glucose: No results found for this or any previous visit (from the past 24 hour(s)).   Goals Met:  Independence with exercise equipment Exercise tolerated well No report of cardiac concerns or symptoms  Goals Unmet:  BP, Dr. Martinique made aware.  Comments: No psychosocial needs identfied, no interventions necessary.  Will continue to monitor.    Dr. Fransico Him is Medical Director for Cardiac Rehab at [facility].

## 2015-03-09 ENCOUNTER — Ambulatory Visit (HOSPITAL_COMMUNITY): Payer: Medicare Other

## 2015-03-09 ENCOUNTER — Encounter (HOSPITAL_COMMUNITY): Payer: 59

## 2015-03-09 ENCOUNTER — Encounter (HOSPITAL_COMMUNITY)
Admission: RE | Admit: 2015-03-09 | Discharge: 2015-03-09 | Disposition: A | Discharge status: Home / Self Care | Payer: 59 | Source: Ambulatory Visit | Attending: Cardiology | Admitting: Cardiology

## 2015-03-09 DIAGNOSIS — I213 ST elevation (STEMI) myocardial infarction of unspecified site: Secondary | ICD-10-CM | POA: Diagnosis not present

## 2015-03-10 ENCOUNTER — Ambulatory Visit (INDEPENDENT_AMBULATORY_CARE_PROVIDER_SITE_OTHER): Payer: 59 | Admitting: Cardiology

## 2015-03-10 ENCOUNTER — Encounter: Payer: Self-pay | Admitting: Cardiology

## 2015-03-10 VITALS — BP 102/42 | HR 58 | Ht 67.0 in | Wt 193.2 lb

## 2015-03-10 DIAGNOSIS — I2510 Atherosclerotic heart disease of native coronary artery without angina pectoris: Secondary | ICD-10-CM | POA: Diagnosis not present

## 2015-03-10 DIAGNOSIS — R570 Cardiogenic shock: Secondary | ICD-10-CM | POA: Diagnosis not present

## 2015-03-10 DIAGNOSIS — Z9861 Coronary angioplasty status: Secondary | ICD-10-CM

## 2015-03-10 DIAGNOSIS — I480 Paroxysmal atrial fibrillation: Secondary | ICD-10-CM

## 2015-03-10 DIAGNOSIS — I2699 Other pulmonary embolism without acute cor pulmonale: Secondary | ICD-10-CM | POA: Diagnosis not present

## 2015-03-10 DIAGNOSIS — E785 Hyperlipidemia, unspecified: Secondary | ICD-10-CM

## 2015-03-10 NOTE — Progress Notes (Signed)
Cardiology Office Note    Date:  03/10/2015   ID:  Ryan Ward, DOB Sep 27, 1945, MRN [id number]  PCP:  Pcp Not In System  Cardiologist:  Dr. Grayer Sproles [place]     Chief Complaint  Patient presents with  . Follow-up    per [name], PA-C pt c/o occasional lightheadedness; BP is running low at Cardiac Rehab    History of Present Illness:  Ryan Ward is a 70 y.o. male seen for follow up CAD.  Admitted 12/07/2014 with inferior STEMI. This was  complicated by complete heart block and cardiogenic shock. He was managed with vasopressors and temporary pacemaker. He had an IABP placed. LHC demonstrated severe 3 vessel CAD with an occluded RCA, severe proximal mid LAD disease and moderate to severe ostial LCx disease (small vessel). He underwent PCI with a DES to the RCA.  Hospitalization was complicated by VF arrest post PCI related to pacemaker spike on T-wave.  EF was 35-45% by cardiac catheterization. Follow-up echo demonstrated EF 50-55%. He developed volume excess and required IV Lasix. Hospitalization was further complicated by  R posterior tibial and peroneal vein DVT and PE. He was placed on heparin and transitioned to Xarelto; Brilinta was stopped and he was placed on Plavix. He developed atrial fibrillation and converted to NSR on IV  amiodarone.  He had recurrent atrial fibrillation and diltiazem was discontinued. He was placed on amiodarone.    On follow up today the patient has progressed well since MI. He has been participating in [place]. Mild bruising but no major bleeding. ASA stopped in Jan. No chest pain or SOB. Taking lasix daily with some swelling in his ankles. He notes energy not quite back to normal. BP noted to stay low.   Past Medical History  Diagnosis Date  . OSA (obstructive sleep apnea)     not using CPAP at home, does have machine  . Hypertension   . Hyperlipidemia   . STEMI (ST elevation myocardial infarction) ([place]) 12/07/2014    100% stenosis of RCA w/ PCI  using DES. 85-95% stenosis in the LAD and will need future staged PCI of the lesion.   . Paroxysmal atrial fibrillation ([place]) 12/12/2014  . Pulmonary embolism ([place]) 12/12/2014    Past Surgical History  Procedure Laterality Date  . Hernia repair    . Cardiac catheterization N/A 12/07/2014    Procedure: Coronary Stent Intervention;  Surgeon: Essynce Munsch M Martinique, MD;  Location: [place] [facility];  Service: Cardiovascular;  Laterality: N/A;  STEMI  . Cardiac catheterization N/A 12/07/2014    Procedure: IABP Insertion;  Surgeon: Marcie Shearon M [place], MD;  Location: [place] [facility];  Service: Cardiovascular;  Laterality: N/A;  . Cardiac catheterization N/A 12/07/2014    Procedure: Temporary Pacemaker;  Surgeon: Jonatan Wilsey M [place], MD;  Location: [place] [facility];  Service: Cardiovascular;  Laterality: N/A;  . Cardiac catheterization N/A 12/07/2014    Procedure: Left Heart Cath and Coronary Angiography;  Surgeon: Del Overfelt M [place]ique, MD;  Location: [place] [facility];  Service: Cardiovascular;  Laterality: N/A;    Current Outpatient Prescriptions  Medication Sig Dispense Refill  . atorvastatin (LIPITOR) 80 MG tablet Take 1 tablet (80 mg total) by mouth daily at 6 PM. 30 tablet 6  . Cholecalciferol (VITAMIN D3) 5000 UNITS TABS Take 1 tablet by mouth daily.    . clopidogrel (PLAVIX) 75 MG tablet Take 1 tablet (75 mg total) by mouth daily. 30 tablet 11  . furosemide (LASIX) 40 MG  tablet Take 1 tablet (40 mg total) by mouth as directed. Take 40 mg with 20 mg lasix to = 60 mg x 3 days; then go to lasix 40 mg daily (Patient taking differently: Take 40 mg by mouth as directed. Currently taking Lasix 40mg  daily) 45 tablet 11  . metoprolol tartrate (LOPRESSOR) 25 MG tablet Take 0.5 tablets (12.5 mg total) by mouth 2 (two) times daily. 60 tablet 0  . Multiple Vitamins-Minerals (CENTRUM SILVER) tablet Take 1 tablet by mouth daily.    . nitroGLYCERIN (NITROSTAT) 0.4 MG SL tablet PLACE 1 TABLET UNDER TONGUE EVERY 5MINUTES X 3  DOSES AS NEEDED FOR CHEST PAIN 25 tablet 4  . potassium chloride (K-DUR) 10 MEQ tablet Take 1 tablet (10 mEq total) by mouth as directed. Take 20 meq daily x 3 days then go to 10 meq daily (Patient taking differently: Take 10 mEq by mouth as directed. Currently taking 36meq daily) 45 tablet 11  . rivaroxaban (XARELTO) 20 MG TABS tablet Take 1 tablet (20 mg total) by mouth daily with supper. 30 tablet 10   No current facility-administered medications for this visit.    Allergies:   Review of patient's allergies indicates no known allergies.   Social History   Social History  . Marital Status: Married    Spouse Name: N/A  . Number of Children: N/A  . Years of Education: N/A   Social History Main Topics  . Smoking status: Never Smoker   . Smokeless tobacco: None  . Alcohol Use: No  . Drug Use: No  . Sexual Activity: Not Asked   Other Topics Concern  . None   Social History Narrative   Works with [name]     Family History:  The patient's family history includes Heart attack in his father.   ROS:   Please see the history of present illness.    ROS All other systems reviewed and are negative.   PHYSICAL EXAM:   VS:  BP 102/42 mmHg  Pulse 58  Ht 5\' 7"  (1.702 m)  Wt 87.635 kg (193 lb 3.2 oz)  BMI 30.25 kg/m2   GEN: Well nourished, well developed, in no acute distress HEENT: normal Neck: no JVD, no masses Cardiac: Normal S1/S2, RRR; no murmurs, rubs, or gallops, 1+ edema;   Respiratory:  clear to auscultation bilaterally; no wheezing, rhonchi or rales GI: soft, nontender, nondistended, + BS MS: no deformity or atrophy Skin: warm and dry, no rash Neuro:  Bilateral strength equal, no focal deficits  Psych: Alert and oriented x 3, normal affect  Wt Readings from Last 3 Encounters:  03/10/15 87.635 kg (193 lb 3.2 oz)  01/27/15 90.9 kg (200 lb 6.4 oz)  01/17/15 90.13 kg (198 lb 11.2 oz)      Studies/Labs Reviewed:   EKG:  EKG is  Not ordered today.    Recent  Labs: 12/07/2014: Magnesium 1.9 12/09/2014: B Natriuretic Peptide 252.8* 01/06/2015: Hemoglobin 11.1*; Platelets 285 01/13/2015: BUN 21; Creat 1.02; Potassium 4.0; Sodium 139 02/17/2015: ALT 55*; TSH 4.55*   Recent Lipid Panel    Component Value Date/Time   CHOL 105* 02/17/2015 0946   TRIG 60 02/17/2015 0946   HDL 32* 02/17/2015 0946   CHOLHDL 3.3 02/17/2015 0946   VLDL 12 02/17/2015 0946   LDLCALC 61 02/17/2015 0946    Additional studies/ records that were reviewed today include:   Echo 12/07/14 Inferior and posterior lateral HK, EF 50-55%  LHC 12/07/14 LM okay LAD proximal 85%, mid 95%  LCx ostial 80% RCA proximal to mid 30%, mid to distal 40%, distal 100% EF 35-45% PCI: 4 x 20 mm Promus Premier DES to the distal RCA Conclusion: 1. Severe 3 vessel obstructive CAD  - 100% occlusion of large RCA distally with very heavy thrombus burden.  _ severe proximal and mid LAD disease  -- Moderate to severe ostial LCx disease- vessel is very small. 2. Moderate LV dysfunction with inferior wall motion abnormality. 3. Successful PCI of the distal RCA with DES and balloon angioplasty for thrombus. 4. Cardiogenic shock- IABP placed and patient started on pressors. 5. Complete heart block. Temporary pacemaker placed.       ASSESSMENT:    1. CAD S/P percutaneous coronary angioplasty   2. Hyperlipidemia   3. Other pulmonary embolism without acute cor pulmonale (HCC)     PLAN:  In order of problems listed above:  1. CAD - s/p Large inferior myocardial infarction complicated by cardiogenic shock and microcirculatory embolization at the time of PCI.Cath showed 100% stenosis of RCA with successful PCI using DES. Also had significant residual disease in the left coronary system. MI also complicated by Vfib, PE, and atrial fibrillation. He has made a very good recovery.  On Xarelto and Plavix currently.   I reviewed the prior cath films with the patient and his wife today. His Left  coronary disease is complex. The LAD is calcified and tortuous. There is diffuse disease in the proximal LAD with 2 moderate sized diagonal branches arising from this segment. There is a 95% mid LAD stenosis just prior to an acute bend. The LCX is small with ostial disease. This does not appear to supply much myocardium. RCA was very large and supplied PL branches. Although acute reperfusion was obtained with stent in distal RCA there is moderate diffuse disease in the proximal and mid RCA. After review of films we discussed treatment options. This includes possible percutaneous options and CABG. From a PCI standpoint this would be a Complex high risk intervention (CHIP) case due to anatomic features noted above. While I think PCI could be done his risk would be higher with 5-6% risk of peri-procedural MI and higher risk of recurrent events and need for repeat procedures. I think that CABG may be the better option in the long term and I have requested consultation with CT surgery. He has recovered enough now from his acute episode that I think he would tolerate surgery well. By that time he would be able to come off Xarelto (3-6 months therapy for PE). We would need to consider how best to manage his antiplatelet therapy. He is now 4 months out from MI so ideally if we could hold off until 6 months we could consider ASA only and holding Plavix for surgery. If Surgery does not feel he is a good candidate or if he refuses surgery we could reconsider high risk PCI.    2.  Ventricular fibrillation - VF arrest post PCI.  Records indicate pacer spike on TW likely cause.  EF recovered by FU echo.    3. Chronic diastolic CHF -  He has only mild edema. I am reluctant to increase diuretics more due to low BP.  4. PAF - He reamins in NSR. Will stop amiodarone since AFib clearly triggered by MI and PE.   5. Pulmonary Embolism - CTA on 12/12/2014 showed a peripheral upper lobe pulmonary emboli, largest present in the  anterior segment right upper lobe pulmonary artery. No evidence of right heart  strain.  LE dopplers confirmed RLE DVT.  He is on Xarelto 20 QD.   Recommend 3-6 months of therapy.  6. HTN - BP soft. He is not symptomatic.  Continue low dose metoprolol.   7. Hyperlipidemia - Continue statin- excellent control.       Medication Adjustments/Labs and Tests Ordered: Current medicines are reviewed at length with the patient today.  Concerns regarding medicines are outlined above.  Medication changes, Labs and Tests ordered today are listed below. Patient Instructions  Stop taking amiodarone.  Continue your other therapy  We will arrange a consultation with one of our cardiac surgeons.    Signed, Winta Barcelo Martinique, MD  03/10/2015 5:56 PM

## 2015-03-10 NOTE — Patient Instructions (Signed)
Stop taking amiodarone.  Continue your other therapy  We will arrange a consultation with one of our cardiac surgeons.

## 2015-03-11 ENCOUNTER — Encounter (HOSPITAL_COMMUNITY)
Admission: RE | Admit: 2015-03-11 | Discharge: 2015-03-11 | Disposition: A | Discharge status: Home / Self Care | Payer: 59 | Source: Ambulatory Visit | Attending: Cardiology | Admitting: Cardiology

## 2015-03-11 ENCOUNTER — Encounter (HOSPITAL_COMMUNITY): Payer: 59

## 2015-03-11 ENCOUNTER — Ambulatory Visit (HOSPITAL_COMMUNITY): Payer: Medicare Other

## 2015-03-11 DIAGNOSIS — I213 ST elevation (STEMI) myocardial infarction of unspecified site: Secondary | ICD-10-CM | POA: Diagnosis not present

## 2015-03-14 ENCOUNTER — Encounter (HOSPITAL_COMMUNITY): Payer: 59

## 2015-03-14 ENCOUNTER — Encounter (HOSPITAL_COMMUNITY)
Admission: RE | Admit: 2015-03-14 | Discharge: 2015-03-14 | Disposition: A | Discharge status: Home / Self Care | Payer: 59 | Source: Ambulatory Visit | Attending: Cardiology | Admitting: Cardiology

## 2015-03-14 ENCOUNTER — Ambulatory Visit (HOSPITAL_COMMUNITY): Payer: Medicare Other

## 2015-03-14 DIAGNOSIS — I213 ST elevation (STEMI) myocardial infarction of unspecified site: Secondary | ICD-10-CM | POA: Diagnosis not present

## 2015-03-14 NOTE — Progress Notes (Signed)
Ryan Ward 70 y.o. male Nutrition Note Spoke with pt. Nutrition Plan and Nutrition Survey goals reviewed with pt. Pt is following Step 2 of the Therapeutic Lifestyle Changes diet. Pt is pre-diabetic according to his last A1c. Pre-diabetes discussed. Pt with dx of CHF. Per discussion, pt does not use canned/convenience foods often. Pt rarely adds salt to food. Pt expressed understanding of the information reviewed. Pt aware of nutrition education classes offered.  Lab Results  Component Value Date   HGBA1C 5.9* 12/07/2014   Wt Readings from Last 3 Encounters:  03/10/15 193 lb 3.2 oz (87.635 kg)  01/27/15 200 lb 6.4 oz (90.9 kg)  01/17/15 198 lb 11.2 oz (90.13 kg)    Nutrition Diagnosis ? Food-and nutrition-related knowledge deficit related to lack of exposure to information as related to diagnosis of: ? CVD ? Pre-DM ? Obesity related to excessive energy intake as evidenced by a BMI of 30.7  Nutrition RX/ Estimated Daily Nutrition Needs for: wt loss (recommended) 1500-2000 Kcal, 40-55 gm fat, 9-13 gm sat fat, 1.4-2.0 gm trans-fat, <1500 mg sodium  Nutrition Intervention ? Pt's individual nutrition plan reviewed with pt. ? Benefits of adopting Therapeutic Lifestyle Changes discussed when Medficts reviewed. ? Pt to attend the Portion Distortion class ? Pt to attend the   ? Nutrition I class - met; 02/08/15                 ? Nutrition II class - met; 02/15/15 ? Continue client-centered nutrition education by RD, as part of interdisciplinary care. Goal(s) ? Pt to identify food quantities necessary to achieve weight loss of 6-24 lb (2.7-10.9 kg) at graduation from cardiac rehab.  ? Pt to describe the benefit of including fruits, vegetables, whole grains, and low-fat dairy products in a heart healthy meal plan. Monitor and Evaluate progress toward nutrition goal with team. Derek Mound, M.Ed, RD, LDN, CDE 03/14/2015 10:01 AM

## 2015-03-16 ENCOUNTER — Ambulatory Visit (HOSPITAL_COMMUNITY): Payer: Medicare Other

## 2015-03-16 ENCOUNTER — Encounter (HOSPITAL_COMMUNITY)
Admission: RE | Admit: 2015-03-16 | Discharge: 2015-03-16 | Disposition: A | Discharge status: Home / Self Care | Payer: 59 | Source: Ambulatory Visit | Attending: Cardiology | Admitting: Cardiology

## 2015-03-16 ENCOUNTER — Encounter (HOSPITAL_COMMUNITY): Payer: 59

## 2015-03-16 DIAGNOSIS — I213 ST elevation (STEMI) myocardial infarction of unspecified site: Secondary | ICD-10-CM | POA: Diagnosis not present

## 2015-03-17 ENCOUNTER — Encounter: Payer: Medicare Other | Admitting: Cardiothoracic Surgery

## 2015-03-17 ENCOUNTER — Other Ambulatory Visit: Payer: Self-pay | Admitting: *Deleted

## 2015-03-17 ENCOUNTER — Encounter: Payer: Self-pay | Admitting: Cardiothoracic Surgery

## 2015-03-17 ENCOUNTER — Institutional Professional Consult (permissible substitution) (INDEPENDENT_AMBULATORY_CARE_PROVIDER_SITE_OTHER): Payer: 59 | Admitting: Cardiothoracic Surgery

## 2015-03-17 VITALS — BP 103/67 | HR 47 | Resp 16 | Ht 67.0 in | Wt 193.0 lb

## 2015-03-17 DIAGNOSIS — I213 ST elevation (STEMI) myocardial infarction of unspecified site: Secondary | ICD-10-CM | POA: Diagnosis not present

## 2015-03-17 DIAGNOSIS — I2510 Atherosclerotic heart disease of native coronary artery without angina pectoris: Secondary | ICD-10-CM | POA: Diagnosis not present

## 2015-03-17 DIAGNOSIS — I5041 Acute combined systolic (congestive) and diastolic (congestive) heart failure: Secondary | ICD-10-CM

## 2015-03-17 NOTE — Progress Notes (Signed)
[address]       [place] [address]             [phone]                    Oshay [facility] Medical Record [id number] Date of Birth: 27-Nov-1945  Referring: Martinique, Peter M, MD Primary Care: Pcp Not In System  Chief Complaint:    Chief Complaint  Patient presents with  . Coronary Artery Disease    eval for CABG..CATH 12/07/14, ECHO 12/07/14    History of Present Illness:    Ryan Ward 70 y.o. male is seen in the office  today for Consideration of coronary artery bypass graft. The patient's last heart catheterization was at the time of acute angioplasty of an occluded right coronary artery and the patient was in cardiogenic shock after an inferior myocardial infarction. He was Admitted 12/07/2014 with inferior STEMI,  complicated by complete heart block and cardiogenic shock. He was managed with vasopressors and temporary pacemaker and  IABP . LHC demonstrated severe 3 vessel CAD with an occluded RCA, severe proximal mid LAD disease and moderate to severe ostial LCx disease (small vessel). He underwent PCI with a DES to the RCA. Hospitalization was complicated by VF arrest post PCI related to pacemaker spike on T-wave. EF was 35-45% by cardiac catheterization. Last echocardiogram was December 6. He developed volume excess and required IV Lasix. Hospitalization was further complicated by R posterior tibial and peroneal vein DVT and PE to the right upper lobe. He was placed on heparin and transitioned to Xarelto; Brilinta was stopped and he was placed on Plavix. He developed atrial fibrillation and converted to NSR on IV amiodarone. He had recurrent atrial fibrillation and diltiazem was discontinued. He was placed on amiodarone.   The  patient has progressed well since MI. He goes to Cardiac Rehab 3 times a week.   No chest pain or SOB. Taking lasix daily with some 3+ pitting edema bilaterally. He has had no classic anginal symptoms since discharge  home.     Current Activity/ Functional Status:  Patient is independent with mobility/ambulation, transfers, ADL's, IADL's.   Zubrod Score: At the time of surgery this patient's most appropriate activity status/level should be described as: []     0    Normal activity, no symptoms [x]     1    Restricted in physical strenuous activity but ambulatory, able to do out light work []     2    Ambulatory and capable of self care, unable to do work activities, up and about               >50 % of waking hours                              []     3    Only limited self care, in bed greater than 50% of waking hours []     4    Completely disabled, no self care, confined to bed or chair []     5    Moribund   Past Medical History  Diagnosis Date  . OSA (obstructive sleep apnea)     not using CPAP at home, does have machine  . Hypertension   . Hyperlipidemia   . STEMI (ST elevation myocardial infarction) ([facility]) 12/07/2014    100% stenosis of RCA w/ PCI using DES. 85-95%  stenosis in the LAD and will need future staged PCI of the lesion.   . Paroxysmal atrial fibrillation ([facility]) 12/12/2014  . Pulmonary embolism ([facility]) 12/12/2014    Past Surgical History  Procedure Laterality Date  . Hernia repair    . Cardiac catheterization N/A 12/07/2014    Procedure: Coronary Stent Intervention;  Surgeon: Peter M Martinique, MD;  Location: [facility];  Service: Cardiovascular;  Laterality: N/A;  STEMI  . Cardiac catheterization N/A 12/07/2014    Procedure: IABP Insertion;  Surgeon: Peter M Martinique, MD;  Location: [facility];  Service: Cardiovascular;  Laterality: N/A;  . Cardiac catheterization N/A 12/07/2014    Procedure: Temporary Pacemaker;  Surgeon: Peter M Martinique, MD;  Location: [facility];  Service: Cardiovascular;  Laterality: N/A;  . Cardiac catheterization N/A 12/07/2014    Procedure: Left Heart Cath and Coronary Angiography;  Surgeon: Peter M Martinique, MD;  Location: [facility];   Service: Cardiovascular;  Laterality: N/A;    Family History  Problem Relation Age of Onset  . Heart attack Father     Social History   Social History  . Marital Status: Married    Spouse Name: N/A  . Number of Children: N/A  . Years of Education: N/A   Occupational History  . Not on file.   Social History Main Topics  . Smoking status: Never Smoker   . Smokeless tobacco: Not on file  . Alcohol Use: No  . Drug Use: No  . Sexual Activity: Not on file   Other Topics Concern  . Not on file   Social History Narrative   Works with [employer]    History  Smoking status  . Never Smoker   Smokeless tobacco  . Not on file    History  Alcohol Use No     No Known Allergies  Current Outpatient Prescriptions  Medication Sig Dispense Refill  . atorvastatin (LIPITOR) 80 MG tablet Take 1 tablet (80 mg total) by mouth daily at 6 PM. 30 tablet 6  . Cholecalciferol (VITAMIN D3) 5000 UNITS TABS Take 1 tablet by mouth daily.    . clopidogrel (PLAVIX) 75 MG tablet Take 1 tablet (75 mg total) by mouth daily. 30 tablet 11  . furosemide (LASIX) 40 MG tablet Take 1 tablet (40 mg total) by mouth as directed. Take 40 mg with 20 mg lasix to = 60 mg x 3 days; then go to lasix 40 mg daily (Patient taking differently: Take 40 mg by mouth as directed. Currently taking Lasix 40mg  daily) 45 tablet 11  . metoprolol tartrate (LOPRESSOR) 25 MG tablet Take 0.5 tablets (12.5 mg total) by mouth 2 (two) times daily. 60 tablet 0  . Multiple Vitamins-Minerals (CENTRUM SILVER) tablet Take 1 tablet by mouth daily.    . nitroGLYCERIN (NITROSTAT) 0.4 MG SL tablet PLACE 1 TABLET UNDER TONGUE EVERY 5MINUTES X 3 DOSES AS NEEDED FOR CHEST PAIN 25 tablet 4  . potassium chloride (K-DUR) 10 MEQ tablet Take 1 tablet (10 mEq total) by mouth as directed. Take 20 meq daily x 3 days then go to 10 meq daily (Patient taking differently: Take 10 mEq by mouth as directed. Currently taking 68meq daily) 45 tablet 11  .  rivaroxaban (XARELTO) 20 MG TABS tablet Take 1 tablet (20 mg total) by mouth daily with supper. 30 tablet 10   No current facility-administered medications for this visit.      Review of Systems:  Cardiac Review of Systems: Y or N  Chest Pain [ at admission not now    ]  Resting SOB [ n  ] Exertional SOB  [ y ]  Orthopnea [ n ]   Pedal Edema [ severe  ]    Palpitations [ y ] Syncope  [ n ]   Presyncope [ y  ]  General Review of Systems: [Y] = yes [  ]=no Constitional: recent weight change [n];  Wt loss over the last 3 months [   ] anorexia [  ]; fatigue [  ]; nausea [  ]; night sweats [  ]; fever [  ]; or chills [  ];          Dental: poor dentition[ n ]; Last Dentist visit:   Eye : blurred vision [  ]; diplopia [   ]; vision changes [  ];  Amaurosis fugax[  ]; Resp: cough [ n ];  wheezing[ n ];  hemoptysis[ n ]; shortness of breath[ n ]; paroxysmal nocturnal dyspnea[  ]; dyspnea on exertion[y  ]; or orthopnea[  ];  GI:  gallstones[  ], vomiting[  ];  dysphagia[  ]; melena[  ];  hematochezia [  ]; heartburn[  ];   Hx of  Colonoscopy[  ]; GU: kidney stones [  ]; hematuria[  ];   dysuria [  ];  nocturia[  ];  history of     obstruction [  ]; urinary frequency [ n ]             Skin: rash, swelling[  ];, hair loss[  ];  peripheral edema[  ];  or itching[  ]; Musculosketetal: myalgias[  ];  joint swelling[  ];  joint erythema[  ];  joint pain[  ];  back pain[  ];  Heme/Lymph: bruising[  ];  bleeding[  ];  anemia[  ];  Neuro: TIA[  n ];  headaches[  ];  stroke[  ];  vertigo[  ];  seizures[  ];   paresthesias[  ];  difficulty walking[ n ];  Psych:depression[  ]; anxiety[  ];  Endocrine: diabetes[ n ];  thyroid dysfunction[ n ];  Immunizations: Flu up to date [  ]; Pneumococcal up to date [  ];  Other:  Physical Exam: BP 103/67 mmHg  Pulse 47  Resp 16  Ht 5\' 7"  (1.702 m)  Wt 193 lb (87.544 kg)  BMI 30.22 kg/m2  SpO2 98%  PHYSICAL EXAMINATION: General appearance: alert, cooperative,  appears stated age and no distress Head: Normocephalic, without obvious abnormality, atraumatic Neck: no adenopathy, no carotid bruit, no JVD, supple, symmetrical, trachea midline and thyroid not enlarged, symmetric, no tenderness/mass/nodules Lymph nodes: Cervical, supraclavicular, and axillary nodes normal. Resp: clear to auscultation bilaterally Back: symmetric, no curvature. ROM normal. No CVA tenderness. Cardio: regular rate and rhythm, S1, S2 normal, no murmur, click, rub or gallop GI: soft, non-tender; bowel sounds normal; no masses,  no organomegaly Extremities: edema 2+ bilaterally right leg greater than left, Homans sign is negative, no sign of DVT and no ulcers, gangrene or trophic changes Neurologic: Grossly normal  Diagnostic Studies & Laboratory data:     Recent Radiology Findings:   No results found.     Recent Lab Findings: Lab Results  Component Value Date   WBC 9.2 01/06/2015   HGB 11.1* 01/06/2015   HCT 33.5* 01/06/2015   PLT 285 01/06/2015   GLUCOSE 109* 01/13/2015   CHOL 105* 02/17/2015  TRIG 60 02/17/2015   HDL 32* 02/17/2015   LDLCALC 61 02/17/2015   ALT 55* 02/17/2015   AST 37* 02/17/2015   NA 139 01/13/2015   K 4.0 01/13/2015   CL 105 01/13/2015   CREATININE 1.02 01/13/2015   BUN 21 01/13/2015   CO2 25 01/13/2015   TSH 4.55* 02/17/2015   INR 1.36 12/07/2014   HGBA1C 5.9* 12/07/2014   CATH: Procedures    Coronary Stent Intervention   IABP Insertion   Left Heart Cath and Coronary Angiography   Temporary Pacemaker    Conclusion     Prox LAD to Mid LAD lesion, 85% stenosed.  Mid LAD to Dist LAD lesion, 95% stenosed.  Ost Cx lesion, 80% stenosed.  Prox RCA to Mid RCA lesion, 30% stenosed.  Mid RCA to Dist RCA lesion, 40% stenosed.  There is moderate left ventricular systolic dysfunction.  Dist RCA lesion, 100% stenosed. Post intervention, there is a 0% residual stenosis. The lesion was previously treated with a stent (unknown  type).  1. Severe 3 vessel obstructive CAD  - 100% occlusion of large RCA distally with very heavy thrombus burden.  _ severe proximal and mid LAD disease  -- Moderate to severe ostial LCx disease- vessel is very small. 2. Moderate LV dysfunction with inferior wall motion abnormality. 3. Successful PCI of the distal RCA with DES and balloon angioplasty for thrombus. 4. Cardiogenic shock- IABP placed and patient started on pressors. 5. Complete heart block. Temporary pacemaker placed.   Plan: transfer to ICU for continued support. Continue IV Aggrastat for 18 hours. When IV Angiomax runs out will transition to IV heparin. Continue DAPT with ASA and Brilinta. When patient recovers from acute infarct will need to consider PCI of the LAD.       Assessment / Plan:   Patient status post inferior myocardial infarction presenting with cardiogenic shock due to a totally occluded right coronary artery treated with drug-coated stent in the right coronary artery. Patient had a right dominant circulation. On review of the films from December he has residual disease involving the proximal mid LAD and the circumflex is likely not possible. Anatomically the patient's LAD disease would make angioplasty difficult. I discussed with him coronary artery bypass grafting in this situation. Before proceeding with coronary artery bypass grafting week will obtain echocardiogram to assess his LV function, we'll have to coordinate with cardiology both timing and transition off his anticoagulation. We'll need to consider repeat her heart catheterization before cardiac surgery to evaluate residual stenoses in the right coronary system. I'll plan to see the patient back in 2 weeks after echocardiogram and will review with cardiology, treatment options     I  spent 40 minutes counseling the patient face to face and 50% or more the  time was spent in counseling and coordination of care. The total time spent in the  appointment was 60 minutes.  [name] MD      [address] [place] [address] Office [phone]   Beeper [phone]  03/17/2015 12:19 PM

## 2015-03-18 ENCOUNTER — Telehealth (HOSPITAL_COMMUNITY): Payer: Self-pay | Admitting: Cardiac Rehabilitation

## 2015-03-18 ENCOUNTER — Encounter (HOSPITAL_COMMUNITY): Payer: 59

## 2015-03-18 ENCOUNTER — Ambulatory Visit (HOSPITAL_COMMUNITY): Payer: Medicare Other

## 2015-03-18 ENCOUNTER — Encounter (HOSPITAL_COMMUNITY)
Admission: RE | Admit: 2015-03-18 | Discharge: 2015-03-18 | Disposition: A | Discharge status: Home / Self Care | Payer: 59 | Source: Ambulatory Visit | Attending: Cardiology | Admitting: Cardiology

## 2015-03-18 DIAGNOSIS — I213 ST elevation (STEMI) myocardial infarction of unspecified site: Secondary | ICD-10-CM | POA: Diagnosis not present

## 2015-03-18 NOTE — Telephone Encounter (Signed)
-----   Message from Peter M Martinique, MD sent at 03/17/2015 12:14 PM EDT ----- Regarding: RE: cardiac rehab Aware of low blood pressure. He lives with low BP. On minimal beta blocker and lasix which I do not wish to reduce. May just need to limit exercise if BP drops. He is seeing surgeon today to discuss revascularization.  Peter Martinique MD, [facility]   ----- Message -----    From: Lowell Guitar, RN    Sent: 03/16/2015  11:22 AM      To: Peter M Martinique, MD Subject: cardiac rehab                                  Dear Dr. Martinique,  Pt hypotensive post exercise at cardiac rehab  90/50 associated with lightheadedness.  Pt given gatorade and banana, symptoms resolved, recheck BP:  93/58.  Pt given another gatorade and saltine crackers.  Please advise.   Thank you, Andi Hence, RN, BSN Cardiac Pulmonary Rehab

## 2015-03-21 ENCOUNTER — Encounter (HOSPITAL_COMMUNITY): Payer: 59

## 2015-03-21 ENCOUNTER — Ambulatory Visit (HOSPITAL_COMMUNITY): Payer: Medicare Other

## 2015-03-21 ENCOUNTER — Encounter (HOSPITAL_COMMUNITY)
Admission: RE | Admit: 2015-03-21 | Discharge: 2015-03-21 | Disposition: A | Discharge status: Home / Self Care | Payer: 59 | Source: Ambulatory Visit | Attending: Cardiology | Admitting: Cardiology

## 2015-03-21 DIAGNOSIS — I213 ST elevation (STEMI) myocardial infarction of unspecified site: Secondary | ICD-10-CM | POA: Diagnosis not present

## 2015-03-22 ENCOUNTER — Ambulatory Visit (HOSPITAL_COMMUNITY)
Admission: RE | Admit: 2015-03-22 | Discharge: 2015-03-22 | Disposition: A | Discharge status: Home / Self Care | Payer: 59 | Source: Ambulatory Visit | Attending: Cardiothoracic Surgery | Admitting: Cardiothoracic Surgery

## 2015-03-22 DIAGNOSIS — I340 Nonrheumatic mitral (valve) insufficiency: Secondary | ICD-10-CM | POA: Diagnosis not present

## 2015-03-22 DIAGNOSIS — I5041 Acute combined systolic (congestive) and diastolic (congestive) heart failure: Secondary | ICD-10-CM

## 2015-03-22 DIAGNOSIS — I517 Cardiomegaly: Secondary | ICD-10-CM | POA: Insufficient documentation

## 2015-03-22 DIAGNOSIS — I252 Old myocardial infarction: Secondary | ICD-10-CM | POA: Diagnosis not present

## 2015-03-22 NOTE — Progress Notes (Signed)
Echocardiogram 2D Echocardiogram has been performed.  Tresa Res 03/22/2015, 2:29 PM

## 2015-03-23 ENCOUNTER — Encounter (HOSPITAL_COMMUNITY): Payer: 59

## 2015-03-23 ENCOUNTER — Encounter (HOSPITAL_COMMUNITY)
Admission: RE | Admit: 2015-03-23 | Discharge: 2015-03-23 | Disposition: A | Discharge status: Home / Self Care | Payer: 59 | Source: Ambulatory Visit | Attending: Cardiology | Admitting: Cardiology

## 2015-03-23 ENCOUNTER — Ambulatory Visit (HOSPITAL_COMMUNITY): Payer: Medicare Other

## 2015-03-23 DIAGNOSIS — I213 ST elevation (STEMI) myocardial infarction of unspecified site: Secondary | ICD-10-CM | POA: Diagnosis not present

## 2015-03-24 ENCOUNTER — Telehealth: Payer: Self-pay

## 2015-03-24 DIAGNOSIS — I2111 ST elevation (STEMI) myocardial infarction involving right coronary artery: Secondary | ICD-10-CM

## 2015-03-24 DIAGNOSIS — I5041 Acute combined systolic (congestive) and diastolic (congestive) heart failure: Secondary | ICD-10-CM

## 2015-03-24 DIAGNOSIS — I5032 Chronic diastolic (congestive) heart failure: Secondary | ICD-10-CM

## 2015-03-24 NOTE — Telephone Encounter (Signed)
Spoke to patient Dr.Jordan reviewed echo.Echo showed LV dysfunction with EF 30 to 35 %.Dr.Jordan spoke to Dr.Gerhardt they felt right and left cardiac cath indicated.Dr.Jordan advised ok to stop xarelto,start aspirin 81 mg daily.Continue Plavix.Right and left cardiac cath scheduled at [facility] Tuesday 03/29/15 at 9:00 am Arrive at 7:00 am.Patient will pick up cath instructions and have pre cath lab Friday 03/25/15.Advised to keep appointment with Dr.Gerhardt Thursday 03/31/15 at 2:00 pm.

## 2015-03-25 ENCOUNTER — Encounter (HOSPITAL_COMMUNITY): Payer: 59

## 2015-03-25 ENCOUNTER — Encounter (HOSPITAL_COMMUNITY)
Admission: RE | Admit: 2015-03-25 | Discharge: 2015-03-25 | Disposition: A | Discharge status: Home / Self Care | Payer: 59 | Source: Ambulatory Visit | Attending: Cardiology | Admitting: Cardiology

## 2015-03-25 ENCOUNTER — Other Ambulatory Visit: Payer: Self-pay | Admitting: Cardiology

## 2015-03-25 ENCOUNTER — Ambulatory Visit (HOSPITAL_COMMUNITY): Payer: Medicare Other

## 2015-03-25 DIAGNOSIS — I213 ST elevation (STEMI) myocardial infarction of unspecified site: Secondary | ICD-10-CM | POA: Diagnosis not present

## 2015-03-25 LAB — BASIC METABOLIC PANEL
BUN: 24 mg/dL (ref 7–25)
CHLORIDE: 103 mmol/L (ref 98–110)
CO2: 29 mmol/L (ref 20–31)
CREATININE: 1.15 mg/dL (ref 0.70–1.25)
Calcium: 8.8 mg/dL (ref 8.6–10.3)
GLUCOSE: 80 mg/dL (ref 65–99)
POTASSIUM: 4 mmol/L (ref 3.5–5.3)
Sodium: 143 mmol/L (ref 135–146)

## 2015-03-25 LAB — CBC WITH DIFFERENTIAL/PLATELET
Basophils Absolute: 0.1 10*3/uL (ref 0.0–0.1)
Basophils Relative: 1 % (ref 0–1)
EOS ABS: 0.2 10*3/uL (ref 0.0–0.7)
EOS PCT: 4 % (ref 0–5)
HEMATOCRIT: 39.4 % (ref 39.0–52.0)
Hemoglobin: 12.8 g/dL — ABNORMAL LOW (ref 13.0–17.0)
LYMPHS ABS: 1.2 10*3/uL (ref 0.7–4.0)
LYMPHS PCT: 20 % (ref 12–46)
MCH: 28.3 pg (ref 26.0–34.0)
MCHC: 32.5 g/dL (ref 30.0–36.0)
MCV: 87 fL (ref 78.0–100.0)
MONOS PCT: 9 % (ref 3–12)
MPV: 11.2 fL (ref 8.6–12.4)
Monocytes Absolute: 0.5 10*3/uL (ref 0.1–1.0)
Neutro Abs: 4 10*3/uL (ref 1.7–7.7)
Neutrophils Relative %: 66 % (ref 43–77)
PLATELETS: 151 10*3/uL (ref 150–400)
RBC: 4.53 MIL/uL (ref 4.22–5.81)
RDW: 15.2 % (ref 11.5–15.5)
WBC: 6 10*3/uL (ref 4.0–10.5)

## 2015-03-25 LAB — PROTIME-INR
INR: 1.26 (ref <1.50)
Prothrombin Time: 16 seconds — ABNORMAL HIGH (ref 11.6–15.2)

## 2015-03-28 ENCOUNTER — Encounter (HOSPITAL_COMMUNITY)
Admission: RE | Admit: 2015-03-28 | Discharge: 2015-03-28 | Disposition: A | Discharge status: Home / Self Care | Payer: 59 | Source: Ambulatory Visit | Attending: Cardiology | Admitting: Cardiology

## 2015-03-28 ENCOUNTER — Ambulatory Visit (HOSPITAL_COMMUNITY): Payer: Medicare Other

## 2015-03-28 ENCOUNTER — Encounter (HOSPITAL_COMMUNITY): Payer: 59

## 2015-03-28 DIAGNOSIS — I213 ST elevation (STEMI) myocardial infarction of unspecified site: Secondary | ICD-10-CM | POA: Diagnosis not present

## 2015-03-28 NOTE — Progress Notes (Signed)
No psychosocial needs identfied, no interventions necessary.  Pt is scheduled for cardiac catheterization 03/29/15.  Will continue to monitor.

## 2015-03-29 ENCOUNTER — Telehealth (HOSPITAL_COMMUNITY): Payer: Self-pay | Admitting: Cardiac Rehabilitation

## 2015-03-29 ENCOUNTER — Encounter (HOSPITAL_COMMUNITY)
Admission: RE | Disposition: A | Discharge status: Home / Self Care | Payer: Self-pay | Source: Ambulatory Visit | Attending: Cardiology

## 2015-03-29 ENCOUNTER — Ambulatory Visit (HOSPITAL_BASED_OUTPATIENT_CLINIC_OR_DEPARTMENT_OTHER)
Admission: RE | Admit: 2015-03-29 | Discharge: 2015-03-29 | Disposition: A | Discharge status: Home / Self Care | Payer: 59 | Source: Ambulatory Visit | Attending: Cardiology | Admitting: Cardiology

## 2015-03-29 DIAGNOSIS — E785 Hyperlipidemia, unspecified: Secondary | ICD-10-CM | POA: Insufficient documentation

## 2015-03-29 DIAGNOSIS — Z8249 Family history of ischemic heart disease and other diseases of the circulatory system: Secondary | ICD-10-CM | POA: Insufficient documentation

## 2015-03-29 DIAGNOSIS — Z9861 Coronary angioplasty status: Principal | ICD-10-CM

## 2015-03-29 DIAGNOSIS — I2584 Coronary atherosclerosis due to calcified coronary lesion: Secondary | ICD-10-CM | POA: Insufficient documentation

## 2015-03-29 DIAGNOSIS — I272 Other secondary pulmonary hypertension: Secondary | ICD-10-CM

## 2015-03-29 DIAGNOSIS — I480 Paroxysmal atrial fibrillation: Secondary | ICD-10-CM

## 2015-03-29 DIAGNOSIS — I5042 Chronic combined systolic (congestive) and diastolic (congestive) heart failure: Secondary | ICD-10-CM | POA: Diagnosis present

## 2015-03-29 DIAGNOSIS — G4733 Obstructive sleep apnea (adult) (pediatric): Secondary | ICD-10-CM | POA: Insufficient documentation

## 2015-03-29 DIAGNOSIS — I2510 Atherosclerotic heart disease of native coronary artery without angina pectoris: Secondary | ICD-10-CM

## 2015-03-29 DIAGNOSIS — I110 Hypertensive heart disease with heart failure: Secondary | ICD-10-CM | POA: Insufficient documentation

## 2015-03-29 DIAGNOSIS — I252 Old myocardial infarction: Secondary | ICD-10-CM

## 2015-03-29 DIAGNOSIS — I10 Essential (primary) hypertension: Secondary | ICD-10-CM | POA: Diagnosis present

## 2015-03-29 DIAGNOSIS — Z86711 Personal history of pulmonary embolism: Secondary | ICD-10-CM | POA: Insufficient documentation

## 2015-03-29 DIAGNOSIS — I442 Atrioventricular block, complete: Secondary | ICD-10-CM | POA: Insufficient documentation

## 2015-03-29 DIAGNOSIS — Z7901 Long term (current) use of anticoagulants: Secondary | ICD-10-CM

## 2015-03-29 DIAGNOSIS — I4901 Ventricular fibrillation: Secondary | ICD-10-CM

## 2015-03-29 DIAGNOSIS — Z7902 Long term (current) use of antithrombotics/antiplatelets: Secondary | ICD-10-CM

## 2015-03-29 HISTORY — PX: CARDIAC CATHETERIZATION: SHX172

## 2015-03-29 LAB — POCT I-STAT 3, VENOUS BLOOD GAS (G3P V)
ACID-BASE EXCESS: 4 mmol/L — AB (ref 0.0–2.0)
BICARBONATE: 30.5 meq/L — AB (ref 20.0–24.0)
O2 SAT: 75 %
PO2 VEN: 42 mmHg (ref 31.0–45.0)
TCO2: 32 mmol/L (ref 0–100)
pCO2, Ven: 52 mmHg — ABNORMAL HIGH (ref 45.0–50.0)
pH, Ven: 7.376 — ABNORMAL HIGH (ref 7.250–7.300)

## 2015-03-29 LAB — POCT I-STAT 3, ART BLOOD GAS (G3+)
Acid-Base Excess: 2 mmol/L (ref 0.0–2.0)
Bicarbonate: 27.8 mEq/L — ABNORMAL HIGH (ref 20.0–24.0)
O2 SAT: 99 %
TCO2: 29 mmol/L (ref 0–100)
pCO2 arterial: 46.4 mmHg — ABNORMAL HIGH (ref 35.0–45.0)
pH, Arterial: 7.385 (ref 7.350–7.450)
pO2, Arterial: 144 mmHg — ABNORMAL HIGH (ref 80.0–100.0)

## 2015-03-29 SURGERY — RIGHT/LEFT HEART CATH AND CORONARY ANGIOGRAPHY
Anesthesia: LOCAL

## 2015-03-29 MED ORDER — IOPAMIDOL (ISOVUE-370) INJECTION 76%
INTRAVENOUS | Status: DC | PRN
  Administered 2015-03-29: 80 mL via INTRAVENOUS

## 2015-03-29 MED ORDER — SODIUM CHLORIDE 0.9 % IV SOLN: 250.0000 mL | INTRAVENOUS | Status: DC | PRN

## 2015-03-29 MED ORDER — MIDAZOLAM HCL 2 MG/2ML IJ SOLN
INTRAMUSCULAR | Status: DC | PRN
  Administered 2015-03-29: 1 mg via INTRAVENOUS

## 2015-03-29 MED ORDER — SODIUM CHLORIDE 0.9% FLUSH: 3.0000 mL | Freq: Two times a day (BID) | INTRAVENOUS | Status: DC

## 2015-03-29 MED ORDER — LIDOCAINE HCL (PF) 1 % IJ SOLN
INTRAMUSCULAR | Status: AC
  Filled 2015-03-29: qty 30

## 2015-03-29 MED ORDER — VERAPAMIL HCL 2.5 MG/ML IV SOLN
INTRAVENOUS | Status: AC
  Filled 2015-03-29: qty 2

## 2015-03-29 MED ORDER — IOPAMIDOL (ISOVUE-370) INJECTION 76%
INTRAVENOUS | Status: AC
  Filled 2015-03-29: qty 100

## 2015-03-29 MED ORDER — HEPARIN (PORCINE) IN NACL 2-0.9 UNIT/ML-% IJ SOLN
INTRAMUSCULAR | Status: DC | PRN
  Administered 2015-03-29: 1000 mL

## 2015-03-29 MED ORDER — HEPARIN SODIUM (PORCINE) 1000 UNIT/ML IJ SOLN
INTRAMUSCULAR | Status: DC | PRN
  Administered 2015-03-29: 4000 [IU] via INTRAVENOUS

## 2015-03-29 MED ORDER — SODIUM CHLORIDE 0.9 % IV SOLN
INTRAVENOUS | Status: DC
  Administered 2015-03-29: 08:00:00 via INTRAVENOUS

## 2015-03-29 MED ORDER — LIDOCAINE HCL (PF) 1 % IJ SOLN
INTRAMUSCULAR | Status: DC | PRN
  Administered 2015-03-29: 2 mL

## 2015-03-29 MED ORDER — FENTANYL CITRATE (PF) 100 MCG/2ML IJ SOLN
INTRAMUSCULAR | Status: DC | PRN
  Administered 2015-03-29: 25 ug via INTRAVENOUS

## 2015-03-29 MED ORDER — SODIUM CHLORIDE 0.9% FLUSH: 3.0000 mL | INTRAVENOUS | Status: DC | PRN

## 2015-03-29 MED ORDER — HEPARIN (PORCINE) IN NACL 2-0.9 UNIT/ML-% IJ SOLN
INTRAMUSCULAR | Status: AC
  Filled 2015-03-29: qty 1000

## 2015-03-29 MED ORDER — HEPARIN SODIUM (PORCINE) 1000 UNIT/ML IJ SOLN
INTRAMUSCULAR | Status: AC
  Filled 2015-03-29: qty 1

## 2015-03-29 MED ORDER — FENTANYL CITRATE (PF) 100 MCG/2ML IJ SOLN
INTRAMUSCULAR | Status: AC
  Filled 2015-03-29: qty 2

## 2015-03-29 MED ORDER — MIDAZOLAM HCL 2 MG/2ML IJ SOLN
INTRAMUSCULAR | Status: AC
  Filled 2015-03-29: qty 2

## 2015-03-29 SURGICAL SUPPLY — 16 items
CATH BALLN WEDGE 5F 110CM (CATHETERS) ×2 IMPLANT
CATH INFINITI 5 FR JL3.5 (CATHETERS) ×2 IMPLANT
CATH INFINITI 5FR ANG PIGTAIL (CATHETERS) ×2 IMPLANT
CATH INFINITI JR4 5F (CATHETERS) ×2 IMPLANT
DEVICE RAD COMP TR BAND LRG (VASCULAR PRODUCTS) ×2 IMPLANT
GLIDESHEATH SLEND SS 6F .021 (SHEATH) ×2 IMPLANT
GUIDEWIRE .025 260CM (WIRE) ×2 IMPLANT
KIT HEART LEFT (KITS) ×2 IMPLANT
PACK CARDIAC CATHETERIZATION (CUSTOM PROCEDURE TRAY) ×2 IMPLANT
SHEATH FAST CATH BRACH 5F 5CM (SHEATH) ×2 IMPLANT
SYR MEDRAD MARK V 150ML (SYRINGE) ×2 IMPLANT
TRANSDUCER W/STOPCOCK (MISCELLANEOUS) ×2 IMPLANT
TUBING CIL FLEX 10 FLL-RA (TUBING) ×2 IMPLANT
WIRE EMERALD 3MM-J .025X260CM (WIRE) ×2 IMPLANT
WIRE HI TORQ VERSACORE-J 145CM (WIRE) ×2 IMPLANT
WIRE J 3MM .035X260CM (WIRE) ×2 IMPLANT

## 2015-03-29 NOTE — Telephone Encounter (Signed)
-----   Message from Peter M Martinique, MD sent at 03/29/2015  2:47 PM EDT ----- Regarding: RE: cardiac rehab Cardiac cath done. He is seeing Dr. Servando Snare Thursday. Can resume Rehab next week.  Peter Martinique MD, [facility]   ----- Message -----    From: Lowell Guitar, RN    Sent: 03/28/2015   9:43 AM      To: Peter M Martinique, MD Subject: cardiac rehab                                  Dear Dr. Martinique,  Ryan Ward is scheduled for RHC/LHC on 03/29/15.  Please let me know when he can return to cardiac rehab after his procedure.  Thank you, Andi Hence, RN, BSN Cardiac Pulmonary Rehab

## 2015-03-29 NOTE — Progress Notes (Signed)
Dr Martinique notified of b/p and no new orders noted

## 2015-03-29 NOTE — Discharge Instructions (Signed)
Radial Site Care °Refer to this sheet in the next few weeks. These instructions provide you with information about caring for yourself after your procedure. Your health care provider may also give you more specific instructions. Your treatment has been planned according to current medical practices, but problems sometimes occur. Call your health care provider if you have any problems or questions after your procedure. °WHAT TO EXPECT AFTER THE PROCEDURE °After your procedure, it is typical to have the following: °· Bruising at the radial site that usually fades within 1-2 weeks. °· Blood collecting in the tissue (hematoma) that may be painful to the touch. It should usually decrease in size and tenderness within 1-2 weeks. °HOME CARE INSTRUCTIONS °· Take medicines only as directed by your health care provider. °· You may shower 24-48 hours after the procedure or as directed by your health care provider. Remove the bandage (dressing) and gently wash the site with plain soap and water. Pat the area dry with a clean towel. Do not rub the site, because this may cause bleeding. °· Do not take baths, swim, or use a hot tub until your health care provider approves. °· Check your insertion site every day for redness, swelling, or drainage. °· Do not apply powder or lotion to the site. °· Do not flex or bend the affected arm for 24 hours or as directed by your health care provider. °· Do not push or pull heavy objects with the affected arm for 24 hours or as directed by your health care provider. °· Do not lift over 10 lb (4.5 kg) for 5 days after your procedure or as directed by your health care provider. °· Ask your health care provider when it is okay to: °¨ Return to work or school. °¨ Resume usual physical activities or sports. °¨ Resume sexual activity. °· Do not drive home if you are discharged the same day as the procedure. Have someone else drive you. °· You may drive 24 hours after the procedure unless otherwise  instructed by your health care provider. °· Do not operate machinery or power tools for 24 hours after the procedure. °· If your procedure was done as an outpatient procedure, which means that you went home the same day as your procedure, a responsible adult should be with you for the first 24 hours after you arrive home. °· Keep all follow-up visits as directed by your health care provider. This is important. °SEEK MEDICAL CARE IF: °· You have a fever. °· You have chills. °· You have increased bleeding from the radial site. Hold pressure on the site. °SEEK IMMEDIATE MEDICAL CARE IF: °· You have unusual pain at the radial site. °· You have redness, warmth, or swelling at the radial site. °· You have drainage (other than a small amount of blood on the dressing) from the radial site. °· The radial site is bleeding, and the bleeding does not stop after 30 minutes of holding steady pressure on the site. °· Your arm or hand becomes pale, cool, tingly, or numb. °  °This information is not intended to replace advice given to you by your health care provider. Make sure you discuss any questions you have with your health care provider. °  °Document Released: 01/20/2010 Document Revised: 01/08/2014 Document Reviewed: 07/06/2013 °Elsevier Interactive Patient Education ©2016 [employer] ° °

## 2015-03-29 NOTE — Interval H&P Note (Signed)
History and Physical Interval Note:  03/29/2015 9:47 AM  Ryan Ward  has presented today for surgery, with the diagnosis of cad   low ef  The various methods of treatment have been discussed with the patient and family. After consideration of risks, benefits and other options for treatment, the patient has consented to  Procedure(s): Right/Left Heart Cath and Coronary Angiography (N/A) as a surgical intervention .  The patient's history has been reviewed, patient examined, no change in status, stable for surgery.  I have reviewed the patient's chart and labs.  Questions were answered to the patient's satisfaction.     Collier Salina [facility] 03/29/2015 9:47 AM

## 2015-03-29 NOTE — H&P (View-Only) (Signed)
Cardiology Office Note    Date:  03/10/2015   ID:  Ryan Ward, DOB [birth year], MRN [id number]  PCP:  Pcp Not In System  Cardiologist:  Dr. Peter Martinique     Chief Complaint  Patient presents with  . Follow-up    per Ryan Ransom, PA-C pt c/o occasional lightheadedness; BP is running low at Cardiac Rehab    History of Present Illness:  Ryan Ward is a 70 y.o. male seen for follow up CAD.  Admitted 12/07/2014 with inferior STEMI. This was  complicated by complete heart block and cardiogenic shock. He was managed with vasopressors and temporary pacemaker. He had an IABP placed. LHC demonstrated severe 3 vessel CAD with an occluded RCA, severe proximal mid LAD disease and moderate to severe ostial LCx disease (small vessel). He underwent PCI with a DES to the RCA.  Hospitalization was complicated by VF arrest post PCI related to pacemaker spike on T-wave.  EF was 35-45% by cardiac catheterization. Follow-up echo demonstrated EF 50-55%. He developed volume excess and required IV Lasix. Hospitalization was further complicated by  R posterior tibial and peroneal vein DVT and PE. He was placed on heparin and transitioned to Xarelto; Brilinta was stopped and he was placed on Plavix. He developed atrial fibrillation and converted to NSR on IV  amiodarone.  He had recurrent atrial fibrillation and diltiazem was discontinued. He was placed on amiodarone.    On follow up today the patient has progressed well since MI. He has been participating in [place]. Mild bruising but no major bleeding. ASA stopped in Jan. No chest pain or SOB. Taking lasix daily with some swelling in his ankles. He notes energy not quite back to normal. BP noted to stay low.   Past Medical History  Diagnosis Date  . OSA (obstructive sleep apnea)     not using CPAP at home, does have machine  . Hypertension   . Hyperlipidemia   . STEMI (ST elevation myocardial infarction) (Ryan Ward) 12/07/2014    100% stenosis of RCA w/ PCI  using DES. 85-95% stenosis in the LAD and will need future staged PCI of the lesion.   . Paroxysmal atrial fibrillation (Ryan Ward) 12/12/2014  . Pulmonary embolism (Ryan Ward) 12/12/2014    Past Surgical History  Procedure Laterality Date  . Hernia repair    . Cardiac catheterization N/A 12/07/2014    Procedure: Coronary Stent Intervention;  Surgeon: Peter M Martinique, MD;  Location: [facility];  Service: Cardiovascular;  Laterality: N/A;  STEMI  . Cardiac catheterization N/A 12/07/2014    Procedure: IABP Insertion;  Surgeon: Peter M Martinique, MD;  Location: [facility];  Service: Cardiovascular;  Laterality: N/A;  . Cardiac catheterization N/A 12/07/2014    Procedure: Temporary Pacemaker;  Surgeon: Peter M Martinique, MD;  Location: [facility];  Service: Cardiovascular;  Laterality: N/A;  . Cardiac catheterization N/A 12/07/2014    Procedure: Left Heart Cath and Coronary Angiography;  Surgeon: Peter M Martinique, MD;  Location: [facility];  Service: Cardiovascular;  Laterality: N/A;    Current Outpatient Prescriptions  Medication Sig Dispense Refill  . atorvastatin (LIPITOR) 80 MG tablet Take 1 tablet (80 mg total) by mouth daily at 6 PM. 30 tablet 6  . Cholecalciferol (VITAMIN D3) 5000 UNITS TABS Take 1 tablet by mouth daily.    . clopidogrel (PLAVIX) 75 MG tablet Take 1 tablet (75 mg total) by mouth daily. 30 tablet 11  . furosemide (LASIX) 40 MG  tablet Take 1 tablet (40 mg total) by mouth as directed. Take 40 mg with 20 mg lasix to = 60 mg x 3 days; then go to lasix 40 mg daily (Patient taking differently: Take 40 mg by mouth as directed. Currently taking Lasix 40mg  daily) 45 tablet 11  . metoprolol tartrate (LOPRESSOR) 25 MG tablet Take 0.5 tablets (12.5 mg total) by mouth 2 (two) times daily. 60 tablet 0  . Multiple Vitamins-Minerals (CENTRUM SILVER) tablet Take 1 tablet by mouth daily.    . nitroGLYCERIN (NITROSTAT) 0.4 MG SL tablet PLACE 1 TABLET UNDER TONGUE EVERY 5MINUTES X 3  DOSES AS NEEDED FOR CHEST PAIN 25 tablet 4  . potassium chloride (K-DUR) 10 MEQ tablet Take 1 tablet (10 mEq total) by mouth as directed. Take 20 meq daily x 3 days then go to 10 meq daily (Patient taking differently: Take 10 mEq by mouth as directed. Currently taking 37meq daily) 45 tablet 11  . rivaroxaban (XARELTO) 20 MG TABS tablet Take 1 tablet (20 mg total) by mouth daily with supper. 30 tablet 10   No current facility-administered medications for this visit.    Allergies:   Review of patient's allergies indicates no known allergies.   Social History   Social History  . Marital Status: Married    Spouse Name: N/A  . Number of Children: N/A  . Years of Education: N/A   Social History Main Topics  . Smoking status: Never Smoker   . Smokeless tobacco: None  . Alcohol Use: No  . Drug Use: No  . Sexual Activity: Not Asked   Other Topics Concern  . None   Social History Narrative   Works with [name]     Family History:  The patient's family history includes Heart attack in his father.   ROS:   Please see the history of present illness.    ROS All other systems reviewed and are negative.   PHYSICAL EXAM:   VS:  BP 102/42 mmHg  Pulse 58  Ht 5\' 7"  (1.702 m)  Wt 87.635 kg (193 lb 3.2 oz)  BMI 30.25 kg/m2   GEN: Well nourished, well developed, in no acute distress HEENT: normal Neck: no JVD, no masses Cardiac: Normal S1/S2, RRR; no murmurs, rubs, or gallops, 1+ edema;   Respiratory:  clear to auscultation bilaterally; no wheezing, rhonchi or rales GI: soft, nontender, nondistended, + BS MS: no deformity or atrophy Skin: warm and dry, no rash Neuro:  Bilateral strength equal, no focal deficits  Psych: Alert and oriented x 3, normal affect  Wt Readings from Last 3 Encounters:  03/10/15 87.635 kg (193 lb 3.2 oz)  01/27/15 90.9 kg (200 lb 6.4 oz)  01/17/15 90.13 kg (198 lb 11.2 oz)      Studies/Labs Reviewed:   EKG:  EKG is  Not ordered today.    Recent  Labs: 12/07/2014: Magnesium 1.9 12/09/2014: B Natriuretic Peptide 252.8* 01/06/2015: Hemoglobin 11.1*; Platelets 285 01/13/2015: BUN 21; Creat 1.02; Potassium 4.0; Sodium 139 02/17/2015: ALT 55*; TSH 4.55*   Recent Lipid Panel    Component Value Date/Time   CHOL 105* 02/17/2015 0946   TRIG 60 02/17/2015 0946   HDL 32* 02/17/2015 0946   CHOLHDL 3.3 02/17/2015 0946   VLDL 12 02/17/2015 0946   LDLCALC 61 02/17/2015 0946    Additional studies/ records that were reviewed today include:   Echo 12/07/14 Inferior and posterior lateral HK, EF 50-55%  LHC 12/07/14 LM okay LAD proximal 85%, mid 95%  LCx ostial 80% RCA proximal to mid 30%, mid to distal 40%, distal 100% EF 35-45% PCI: 4 x 20 mm Promus Premier DES to the distal RCA Conclusion: 1. Severe 3 vessel obstructive CAD  - 100% occlusion of large RCA distally with very heavy thrombus burden.  _ severe proximal and mid LAD disease  -- Moderate to severe ostial LCx disease- vessel is very small. 2. Moderate LV dysfunction with inferior wall motion abnormality. 3. Successful PCI of the distal RCA with DES and balloon angioplasty for thrombus. 4. Cardiogenic shock- IABP placed and patient started on pressors. 5. Complete heart block. Temporary pacemaker placed.       ASSESSMENT:    1. CAD S/P percutaneous coronary angioplasty   2. Hyperlipidemia   3. Other pulmonary embolism without acute cor pulmonale (HCC)     PLAN:  In order of problems listed above:  1. CAD - s/p Large inferior myocardial infarction complicated by cardiogenic shock and microcirculatory embolization at the time of PCI.Cath showed 100% stenosis of RCA with successful PCI using DES. Also had significant residual disease in the left coronary system. MI also complicated by Vfib, PE, and atrial fibrillation. He has made a very good recovery.  On Xarelto and Plavix currently.   I reviewed the prior cath films with the patient and his wife today. His Left  coronary disease is complex. The LAD is calcified and tortuous. There is diffuse disease in the proximal LAD with 2 moderate sized diagonal branches arising from this segment. There is a 95% mid LAD stenosis just prior to an acute bend. The LCX is small with ostial disease. This does not appear to supply much myocardium. RCA was very large and supplied PL branches. Although acute reperfusion was obtained with stent in distal RCA there is moderate diffuse disease in the proximal and mid RCA. After review of films we discussed treatment options. This includes possible percutaneous options and CABG. From a PCI standpoint this would be a Complex high risk intervention (CHIP) case due to anatomic features noted above. While I think PCI could be done his risk would be higher with 5-6% risk of peri-procedural MI and higher risk of recurrent events and need for repeat procedures. I think that CABG may be the better option in the long term and I have requested consultation with CT surgery. He has recovered enough now from his acute episode that I think he would tolerate surgery well. By that time he would be able to come off Xarelto (3-6 months therapy for PE). We would need to consider how best to manage his antiplatelet therapy. He is now 4 months out from MI so ideally if we could hold off until 6 months we could consider ASA only and holding Plavix for surgery. If Surgery does not feel he is a good candidate or if he refuses surgery we could reconsider high risk PCI.    2.  Ventricular fibrillation - VF arrest post PCI.  Records indicate pacer spike on TW likely cause.  EF recovered by FU echo.    3. Chronic diastolic CHF -  He has only mild edema. I am reluctant to increase diuretics more due to low BP.  4. PAF - He reamins in NSR. Will stop amiodarone since AFib clearly triggered by MI and PE.   5. Pulmonary Embolism - CTA on 12/12/2014 showed a peripheral upper lobe pulmonary emboli, largest present in the  anterior segment right upper lobe pulmonary artery. No evidence of right heart  strain.  LE dopplers confirmed RLE DVT.  He is on Xarelto 20 QD.   Recommend 3-6 months of therapy.  6. HTN - BP soft. He is not symptomatic.  Continue low dose metoprolol.   7. Hyperlipidemia - Continue statin- excellent control.       Medication Adjustments/Labs and Tests Ordered: Current medicines are reviewed at length with the patient today.  Concerns regarding medicines are outlined above.  Medication changes, Labs and Tests ordered today are listed below. Patient Instructions  Stop taking amiodarone.  Continue your other therapy  We will arrange a consultation with one of our cardiac surgeons.    Signed, Peter Martinique, MD  03/10/2015 5:56 PM

## 2015-03-30 ENCOUNTER — Encounter (HOSPITAL_COMMUNITY): Payer: 59

## 2015-03-30 ENCOUNTER — Ambulatory Visit (HOSPITAL_COMMUNITY): Payer: Medicare Other

## 2015-03-30 ENCOUNTER — Encounter (HOSPITAL_COMMUNITY): Admission: RE | Admit: 2015-03-30 | Payer: 59 | Source: Ambulatory Visit

## 2015-03-30 ENCOUNTER — Encounter (HOSPITAL_COMMUNITY): Payer: Self-pay | Admitting: Cardiology

## 2015-03-30 MED FILL — Verapamil HCl IV Soln 2.5 MG/ML: INTRAVENOUS | Qty: 2 | Status: AC

## 2015-03-31 ENCOUNTER — Encounter: Payer: Self-pay | Admitting: Cardiothoracic Surgery

## 2015-03-31 ENCOUNTER — Ambulatory Visit (INDEPENDENT_AMBULATORY_CARE_PROVIDER_SITE_OTHER): Payer: 59 | Admitting: Cardiothoracic Surgery

## 2015-03-31 VITALS — BP 84/54 | HR 53 | Resp 16 | Ht 67.0 in | Wt 180.0 lb

## 2015-03-31 DIAGNOSIS — I213 ST elevation (STEMI) myocardial infarction of unspecified site: Secondary | ICD-10-CM

## 2015-03-31 DIAGNOSIS — I2510 Atherosclerotic heart disease of native coronary artery without angina pectoris: Secondary | ICD-10-CM | POA: Diagnosis not present

## 2015-03-31 NOTE — Progress Notes (Signed)
[address]       [place] [address]             [phone]                    Mance Bear Thoreau Medical Record [id number] Date of Birth: 1945-12-21  Referring: Martinique, Peter M, MD Primary Care: Leonard Downing, MD  Chief Complaint:    Chief Complaint  Patient presents with  . Follow-up    2 weeks with ECHO    History of Present Illness:    Ryan Ward 70 y.o. male is seen in the office  today for Consideration of coronary artery bypass graft. The patient's last heart catheterization was at the time of acute angioplasty of an occluded right coronary artery and the patient was in cardiogenic shock after an inferior myocardial infarction. He was Admitted 12/07/2014 with inferior STEMI,  complicated by complete heart block and cardiogenic shock. He was managed with vasopressors and temporary pacemaker and  IABP . LHC demonstrated severe 3 vessel CAD with an occluded RCA, severe proximal mid LAD disease and moderate to severe ostial LCx disease (small vessel). He underwent PCI with a DES to the RCA. Hospitalization was complicated by VF arrest post PCI related to pacemaker spike on T-wave. EF was 35-45% by cardiac catheterization. Last echocardiogram was December 6. He developed volume excess and required IV Lasix. Hospitalization was further complicated by R posterior tibial and peroneal vein DVT and PE to the right upper lobe. He was placed on heparin and transitioned to Xarelto; Brilinta was stopped and he was placed on Plavix. He developed atrial fibrillation and converted to NSR on IV amiodarone. He had recurrent atrial fibrillation and diltiazem was discontinued. He was placed on amiodarone.   Repeat cardiac catheterization was done several days ago,  The  patient has progressed well since MI. He goes to Cardiac Rehab 3 times a week.   No chest pain or SOB. Taking lasix daily with some 3+ pitting edema bilaterally. He has had no classic  anginal symptoms since discharge home.     Current Activity/ Functional Status:  Patient is independent with mobility/ambulation, transfers, ADL's, IADL's.   Zubrod Score: At the time of surgery this patient's most appropriate activity status/level should be described as: []     0    Normal activity, no symptoms [x]     1    Restricted in physical strenuous activity but ambulatory, able to do out light work []     2    Ambulatory and capable of self care, unable to do work activities, up and about               >50 % of waking hours                              []     3    Only limited self care, in bed greater than 50% of waking hours []     4    Completely disabled, no self care, confined to bed or chair []     5    Moribund   Past Medical History  Diagnosis Date  . OSA (obstructive sleep apnea)     not using CPAP at home, does have machine  . Hypertension   . Hyperlipidemia   . STEMI (ST elevation myocardial infarction) ([place]) 12/07/2014    100% stenosis of RCA w/  PCI using DES. 85-95% stenosis in the LAD and will need future staged PCI of the lesion.   . Paroxysmal atrial fibrillation ([place]) 12/12/2014  . Pulmonary embolism ([place]) 12/12/2014    Past Surgical History  Procedure Laterality Date  . Hernia repair    . Cardiac catheterization N/A 12/07/2014    Procedure: Coronary Stent Intervention;  Surgeon: Peter M Martinique, MD;  Location: [facility];  Service: Cardiovascular;  Laterality: N/A;  STEMI  . Cardiac catheterization N/A 12/07/2014    Procedure: IABP Insertion;  Surgeon: Peter M Martinique, MD;  Location: [facility];  Service: Cardiovascular;  Laterality: N/A;  . Cardiac catheterization N/A 12/07/2014    Procedure: Temporary Pacemaker;  Surgeon: Peter M Martinique, MD;  Location: [facility];  Service: Cardiovascular;  Laterality: N/A;  . Cardiac catheterization N/A 12/07/2014    Procedure: Left Heart Cath and Coronary Angiography;  Surgeon: Peter M Martinique, MD;   Location: [facility];  Service: Cardiovascular;  Laterality: N/A;  . Cardiac catheterization N/A 03/29/2015    Procedure: Right/Left Heart Cath and Coronary Angiography;  Surgeon: Peter M Martinique, MD;  Location: [facility];  Service: Cardiovascular;  Laterality: N/A;    Family History  Problem Relation Age of Onset  . Heart attack Father     Social History   Social History  . Marital Status: Significant Other    Spouse Name: N/A  . Number of Children: N/A  . Years of Education: N/A   Occupational History  . Not on file.   Social History Main Topics  . Smoking status: Never Smoker   . Smokeless tobacco: Not on file  . Alcohol Use: No  . Drug Use: No  . Sexual Activity: Not on file   Other Topics Concern  . Not on file   Social History Narrative   Works with [employer]    History  Smoking status  . Never Smoker   Smokeless tobacco  . Not on file    History  Alcohol Use No     No Known Allergies  Current Outpatient Prescriptions  Medication Sig Dispense Refill  . aspirin EC 81 MG tablet Take 1 tablet (81 mg total) by mouth daily. 90 tablet 3  . atorvastatin (LIPITOR) 80 MG tablet Take 1 tablet (80 mg total) by mouth daily at 6 PM. 30 tablet 6  . Cholecalciferol (VITAMIN D3) 5000 UNITS TABS Take 1 tablet by mouth daily.    . clopidogrel (PLAVIX) 75 MG tablet Take 1 tablet (75 mg total) by mouth daily. 30 tablet 11  . furosemide (LASIX) 40 MG tablet Take 1 tablet (40 mg total) by mouth as directed. Take 40 mg with 20 mg lasix to = 60 mg x 3 days; then go to lasix 40 mg daily (Patient taking differently: Take 40 mg by mouth daily. Currently taking Lasix 40mg  daily) 45 tablet 11  . metoprolol tartrate (LOPRESSOR) 25 MG tablet Take 0.5 tablets (12.5 mg total) by mouth 2 (two) times daily. 60 tablet 0  . Multiple Vitamins-Minerals (CENTRUM SILVER) tablet Take 1 tablet by mouth daily.    . nitroGLYCERIN (NITROSTAT) 0.4 MG SL tablet PLACE 1 TABLET UNDER  TONGUE EVERY 5MINUTES X 3 DOSES AS NEEDED FOR CHEST PAIN 25 tablet 4  . potassium chloride (K-DUR) 10 MEQ tablet Take 1 tablet (10 mEq total) by mouth as directed. Take 20 meq daily x 3 days then go to 10 meq daily (Patient taking differently:  Take 10 mEq by mouth daily. Currently taking 35meq daily) 45 tablet 11   No current facility-administered medications for this visit.      Review of Systems:     Cardiac Review of Systems: Y or N  Chest Pain [ at admission not now    ]  Resting SOB [ n  ] Exertional SOB  [ y ]  Orthopnea [ n ]   Pedal Edema [ severe  ]    Palpitations [ y ] Syncope  [ n ]   Presyncope [ y  ]  General Review of Systems: [Y] = yes [  ]=no Constitional: recent weight change [n];  Wt loss over the last 3 months [   ] anorexia [  ]; fatigue [  ]; nausea [  ]; night sweats [  ]; fever [  ]; or chills [  ];          Dental: poor dentition[ n ]; Last Dentist visit:   Eye : blurred vision [  ]; diplopia [   ]; vision changes [  ];  Amaurosis fugax[  ]; Resp: cough [ n ];  wheezing[ n ];  hemoptysis[ n ]; shortness of breath[ n ]; paroxysmal nocturnal dyspnea[  ]; dyspnea on exertion[y  ]; or orthopnea[  ];  GI:  gallstones[  ], vomiting[  ];  dysphagia[  ]; melena[  ];  hematochezia [  ]; heartburn[  ];   Hx of  Colonoscopy[  ]; GU: kidney stones [  ]; hematuria[  ];   dysuria [  ];  nocturia[  ];  history of     obstruction [  ]; urinary frequency [ n ]             Skin: rash, swelling[  ];, hair loss[  ];  peripheral edema[  ];  or itching[  ]; Musculosketetal: myalgias[  ];  joint swelling[  ];  joint erythema[  ];  joint pain[  ];  back pain[  ];  Heme/Lymph: bruising[  ];  bleeding[  ];  anemia[  ];  Neuro: TIA[  n ];  headaches[  ];  stroke[  ];  vertigo[  ];  seizures[  ];   paresthesias[  ];  difficulty walking[ n ];  Psych:depression[  ]; anxiety[  ];  Endocrine: diabetes[ n ];  thyroid dysfunction[ n ];  Immunizations: Flu up to date [  ]; Pneumococcal up to date [   ];  Other:  Physical Exam: There were no vitals taken for this visit.  PHYSICAL EXAMINATION: General appearance: alert, cooperative, appears stated age and no distress Head: Normocephalic, without obvious abnormality, atraumatic Neck: no adenopathy, no carotid bruit, no JVD, supple, symmetrical, trachea midline and thyroid not enlarged, symmetric, no tenderness/mass/nodules Lymph nodes: Cervical, supraclavicular, and axillary nodes normal. Resp: clear to auscultation bilaterally Back: symmetric, no curvature. ROM normal. No CVA tenderness. Cardio: regular rate and rhythm, S1, S2 normal, no murmur, click, rub or gallop GI: soft, non-tender; bowel sounds normal; no masses,  no organomegaly Extremities: edema 2+ bilaterally right leg greater than left, Homans sign is negative, no sign of DVT and no ulcers, gangrene or trophic changes Neurologic: Grossly normal  Diagnostic Studies & Laboratory data:     Recent Radiology Findings:   No results found.     Recent Lab Findings: Lab Results  Component Value Date   WBC 6.0 03/24/2015   HGB 12.8* 03/24/2015   HCT 39.4 03/24/2015   PLT  151 03/24/2015   GLUCOSE 80 03/24/2015   CHOL 105* 02/17/2015   TRIG 60 02/17/2015   HDL 32* 02/17/2015   LDLCALC 61 02/17/2015   ALT 55* 02/17/2015   AST 37* 02/17/2015   NA 143 03/24/2015   K 4.0 03/24/2015   CL 103 03/24/2015   CREATININE 1.15 03/24/2015   BUN 24 03/24/2015   CO2 29 03/24/2015   TSH 4.55* 02/17/2015   INR 1.26 03/25/2015   HGBA1C 5.9* 12/07/2014   CATH: repeat cath: Right/Left Heart Cath and Coronary Angiography    Conclusion     Prox LAD to Mid LAD lesion, 85% stenosed.  Mid LAD to Dist LAD lesion, 95% stenosed.  Ost Cx lesion, 80% stenosed.  Prox RCA to Mid RCA lesion, 30% stenosed.  Mid RCA to Dist RCA lesion, 40% stenosed.  RPDA lesion, 75% stenosed.  There is moderate to severe left ventricular systolic dysfunction.  1. Severe 3 vessel obstructive CAD.  Stent in the distal RCA is widely patent.  2. Moderate to severe LV dysfunction with EF 30-35% 3. Mild to moderate pulmonary HTN with elevated LV filling pressures. Prominent V wave on PCWP tracing due to decreased LV compliance.  4. Normal cardiac output.  Plan: Revascularization with CABG.         Procedures    Coronary Stent Intervention   IABP Insertion   Left Heart Cath and Coronary Angiography   Temporary Pacemaker    Conclusion     Prox LAD to Mid LAD lesion, 85% stenosed.  Mid LAD to Dist LAD lesion, 95% stenosed.  Ost Cx lesion, 80% stenosed.  Prox RCA to Mid RCA lesion, 30% stenosed.  Mid RCA to Dist RCA lesion, 40% stenosed.  There is moderate left ventricular systolic dysfunction.  Dist RCA lesion, 100% stenosed. Post intervention, there is a 0% residual stenosis. The lesion was previously treated with a stent (unknown type).  1. Severe 3 vessel obstructive CAD  - 100% occlusion of large RCA distally with very heavy thrombus burden.  _ severe proximal and mid LAD disease  -- Moderate to severe ostial LCx disease- vessel is very small. 2. Moderate LV dysfunction with inferior wall motion abnormality. 3. Successful PCI of the distal RCA with DES and balloon angioplasty for thrombus. 4. Cardiogenic shock- IABP placed and patient started on pressors. 5. Complete heart block. Temporary pacemaker placed.   Plan: transfer to ICU for continued support. Continue IV Aggrastat for 18 hours. When IV Angiomax runs out will transition to IV heparin. Continue DAPT with ASA and Brilinta. When patient recovers from acute infarct will need to consider PCI of the LAD.       Assessment / Plan:   Patient status post inferior myocardial infarction presenting with cardiogenic shock due to a totally occluded right coronary artery treated with drug-coated stent in the right coronary artery. Patient had a right dominant circulation. On review of the films from  December he has residual disease involving the proximal mid LAD and the circumflex is likely not possible. Anatomically the patient's LAD disease would make angioplasty difficult. I discussed with him coronary artery bypass grafting in this situation.  On review of the patient's follow-up CT scan he has diffuse nonocclusive disease throughout the right coronary artery with some lucency just distal to the stent in the distal right coronary artery the artery has the appearance of unstable plaque in addition he has a very high-grade proximal 95% LAD lesion and poor LV function. And moderate pulmonary hypertension.  I discussed with the patient proceeding with coronary artery bypass grafting to the LAD and to the PDA PL branches. There is no bypassable vessels in the circumflex distribution. Because of the patient's recent drug eluding stent placed in the right coronary artery and is very high-grade LAD lesion recommended to him that we proceed with surgery next week April 4. We will contact cardiology to have them admitting and transition him off of Plavix in the hospital on heparin or Integrilin rather than risking acute occlusion of the stent or the LAD while being transitioned off Plavix and Xarelto simultaneously.     [name] MD      [address] [place] [address] Office [phone]   Beeper [phone]  03/31/2015 2:20 PM

## 2015-04-01 ENCOUNTER — Ambulatory Visit (HOSPITAL_COMMUNITY): Payer: Medicare Other

## 2015-04-01 ENCOUNTER — Telehealth: Payer: Self-pay

## 2015-04-01 ENCOUNTER — Encounter (HOSPITAL_COMMUNITY): Payer: 59

## 2015-04-01 ENCOUNTER — Encounter (HOSPITAL_COMMUNITY): Admission: RE | Admit: 2015-04-01 | Payer: 59 | Source: Ambulatory Visit

## 2015-04-01 ENCOUNTER — Encounter (HOSPITAL_COMMUNITY): Payer: Self-pay | Admitting: General Practice

## 2015-04-01 ENCOUNTER — Other Ambulatory Visit: Payer: Self-pay

## 2015-04-01 ENCOUNTER — Other Ambulatory Visit: Payer: Self-pay | Admitting: *Deleted

## 2015-04-01 ENCOUNTER — Inpatient Hospital Stay (HOSPITAL_COMMUNITY)
Admission: AD | Admit: 2015-04-01 | Discharge: 2015-04-13 | DRG: 234 | Disposition: A | Discharge status: Home Health | Payer: 59 | Source: Ambulatory Visit | Attending: Cardiothoracic Surgery | Admitting: Cardiothoracic Surgery

## 2015-04-01 DIAGNOSIS — I2510 Atherosclerotic heart disease of native coronary artery without angina pectoris: Secondary | ICD-10-CM

## 2015-04-01 DIAGNOSIS — Z7982 Long term (current) use of aspirin: Secondary | ICD-10-CM | POA: Diagnosis not present

## 2015-04-01 DIAGNOSIS — Z7901 Long term (current) use of anticoagulants: Secondary | ICD-10-CM

## 2015-04-01 DIAGNOSIS — I272 Other secondary pulmonary hypertension: Secondary | ICD-10-CM | POA: Diagnosis present

## 2015-04-01 DIAGNOSIS — Z955 Presence of coronary angioplasty implant and graft: Secondary | ICD-10-CM | POA: Diagnosis not present

## 2015-04-01 DIAGNOSIS — Z951 Presence of aortocoronary bypass graft: Secondary | ICD-10-CM | POA: Diagnosis not present

## 2015-04-01 DIAGNOSIS — Z86718 Personal history of other venous thrombosis and embolism: Secondary | ICD-10-CM | POA: Diagnosis not present

## 2015-04-01 DIAGNOSIS — I442 Atrioventricular block, complete: Secondary | ICD-10-CM | POA: Diagnosis present

## 2015-04-01 DIAGNOSIS — J939 Pneumothorax, unspecified: Secondary | ICD-10-CM

## 2015-04-01 DIAGNOSIS — I255 Ischemic cardiomyopathy: Secondary | ICD-10-CM | POA: Diagnosis present

## 2015-04-01 DIAGNOSIS — Z9689 Presence of other specified functional implants: Secondary | ICD-10-CM

## 2015-04-01 DIAGNOSIS — J45909 Unspecified asthma, uncomplicated: Secondary | ICD-10-CM | POA: Diagnosis present

## 2015-04-01 DIAGNOSIS — I25119 Atherosclerotic heart disease of native coronary artery with unspecified angina pectoris: Secondary | ICD-10-CM | POA: Diagnosis not present

## 2015-04-01 DIAGNOSIS — E875 Hyperkalemia: Secondary | ICD-10-CM | POA: Diagnosis not present

## 2015-04-01 DIAGNOSIS — R0602 Shortness of breath: Secondary | ICD-10-CM

## 2015-04-01 DIAGNOSIS — Z79899 Other long term (current) drug therapy: Secondary | ICD-10-CM

## 2015-04-01 DIAGNOSIS — I5042 Chronic combined systolic (congestive) and diastolic (congestive) heart failure: Secondary | ICD-10-CM | POA: Diagnosis present

## 2015-04-01 DIAGNOSIS — I480 Paroxysmal atrial fibrillation: Secondary | ICD-10-CM | POA: Diagnosis present

## 2015-04-01 DIAGNOSIS — E785 Hyperlipidemia, unspecified: Secondary | ICD-10-CM | POA: Diagnosis present

## 2015-04-01 DIAGNOSIS — Z09 Encounter for follow-up examination after completed treatment for conditions other than malignant neoplasm: Secondary | ICD-10-CM

## 2015-04-01 DIAGNOSIS — I110 Hypertensive heart disease with heart failure: Secondary | ICD-10-CM | POA: Diagnosis present

## 2015-04-01 DIAGNOSIS — E119 Type 2 diabetes mellitus without complications: Secondary | ICD-10-CM | POA: Diagnosis present

## 2015-04-01 DIAGNOSIS — G4733 Obstructive sleep apnea (adult) (pediatric): Secondary | ICD-10-CM | POA: Diagnosis present

## 2015-04-01 DIAGNOSIS — Z9861 Coronary angioplasty status: Secondary | ICD-10-CM

## 2015-04-01 DIAGNOSIS — Z86711 Personal history of pulmonary embolism: Secondary | ICD-10-CM | POA: Diagnosis not present

## 2015-04-01 DIAGNOSIS — D62 Acute posthemorrhagic anemia: Secondary | ICD-10-CM | POA: Diagnosis not present

## 2015-04-01 DIAGNOSIS — I252 Old myocardial infarction: Secondary | ICD-10-CM | POA: Diagnosis not present

## 2015-04-01 DIAGNOSIS — I10 Essential (primary) hypertension: Secondary | ICD-10-CM | POA: Diagnosis not present

## 2015-04-01 DIAGNOSIS — I340 Nonrheumatic mitral (valve) insufficiency: Secondary | ICD-10-CM | POA: Diagnosis present

## 2015-04-01 HISTORY — DX: Unspecified asthma, uncomplicated: J45.909

## 2015-04-01 LAB — CBC WITH DIFFERENTIAL/PLATELET
Basophils Absolute: 0.1 10*3/uL (ref 0.0–0.1)
Basophils Relative: 1 %
EOS ABS: 0.2 10*3/uL (ref 0.0–0.7)
Eosinophils Relative: 4 %
HEMATOCRIT: 38.9 % — AB (ref 39.0–52.0)
HEMOGLOBIN: 12.5 g/dL — AB (ref 13.0–17.0)
LYMPHS ABS: 1.6 10*3/uL (ref 0.7–4.0)
LYMPHS PCT: 26 %
MCH: 27.8 pg (ref 26.0–34.0)
MCHC: 32.1 g/dL (ref 30.0–36.0)
MCV: 86.6 fL (ref 78.0–100.0)
MONOS PCT: 15 %
Monocytes Absolute: 0.9 10*3/uL (ref 0.1–1.0)
NEUTROS PCT: 54 %
Neutro Abs: 3.4 10*3/uL (ref 1.7–7.7)
Platelets: 180 10*3/uL (ref 150–400)
RBC: 4.49 MIL/uL (ref 4.22–5.81)
RDW: 16 % — AB (ref 11.5–15.5)
WBC: 6.3 10*3/uL (ref 4.0–10.5)

## 2015-04-01 LAB — COMPREHENSIVE METABOLIC PANEL
ALT: 37 U/L (ref 17–63)
AST: 28 U/L (ref 15–41)
Albumin: 3.2 g/dL — ABNORMAL LOW (ref 3.5–5.0)
Alkaline Phosphatase: 68 U/L (ref 38–126)
Anion gap: 7 (ref 5–15)
BUN: 15 mg/dL (ref 6–20)
CHLORIDE: 107 mmol/L (ref 101–111)
CO2: 28 mmol/L (ref 22–32)
CREATININE: 1.17 mg/dL (ref 0.61–1.24)
Calcium: 8.9 mg/dL (ref 8.9–10.3)
Glucose, Bld: 103 mg/dL — ABNORMAL HIGH (ref 65–99)
POTASSIUM: 3.9 mmol/L (ref 3.5–5.1)
SODIUM: 142 mmol/L (ref 135–145)
Total Bilirubin: 0.5 mg/dL (ref 0.3–1.2)
Total Protein: 4.9 g/dL — ABNORMAL LOW (ref 6.5–8.1)

## 2015-04-01 LAB — HEPARIN LEVEL (UNFRACTIONATED): Heparin Unfractionated: <0.1 IU/mL — ABNORMAL LOW (ref 0.30–0.70)

## 2015-04-01 LAB — APTT: aPTT: 28 seconds (ref 24–37)

## 2015-04-01 LAB — PROTIME-INR
INR: 1.23 (ref 0.00–1.49)
Prothrombin Time: 15.7 seconds — ABNORMAL HIGH (ref 11.6–15.2)

## 2015-04-01 MED ORDER — METOPROLOL TARTRATE 12.5 MG HALF TABLET
12.5000 mg | ORAL_TABLET | Freq: Two times a day (BID) | ORAL | Status: DC
  Administered 2015-04-03: 12.5 mg via ORAL
  Filled 2015-04-01 (×6): qty 1

## 2015-04-01 MED ORDER — VITAMIN D 1000 UNITS PO TABS
5000.0000 [IU] | ORAL_TABLET | Freq: Every day | ORAL | Status: DC
  Administered 2015-04-02 – 2015-04-13 (×10): 5000 [IU] via ORAL
  Filled 2015-04-01 (×12): qty 5

## 2015-04-01 MED ORDER — HEPARIN BOLUS VIA INFUSION
4000.0000 [IU] | Freq: Once | INTRAVENOUS | Status: AC
  Administered 2015-04-01: 4000 [IU] via INTRAVENOUS
  Filled 2015-04-01: qty 4000

## 2015-04-01 MED ORDER — FUROSEMIDE 10 MG/ML IJ SOLN
40.0000 mg | Freq: Every day | INTRAMUSCULAR | Status: DC
  Administered 2015-04-01 – 2015-04-03 (×3): 40 mg via INTRAVENOUS
  Filled 2015-04-01 (×3): qty 4

## 2015-04-01 MED ORDER — ONDANSETRON HCL 4 MG/2ML IJ SOLN: 4.0000 mg | Freq: Four times a day (QID) | INTRAMUSCULAR | Status: DC | PRN

## 2015-04-01 MED ORDER — SODIUM CHLORIDE 0.9 % IV SOLN
250.0000 mL | INTRAVENOUS | Status: DC | PRN
  Administered 2015-04-04: 250 mL via INTRAVENOUS
  Administered 2015-04-06: 08:00:00 via INTRAVENOUS

## 2015-04-01 MED ORDER — SODIUM CHLORIDE 0.9% FLUSH
3.0000 mL | Freq: Two times a day (BID) | INTRAVENOUS | Status: DC
  Administered 2015-04-03: 3 mL via INTRAVENOUS

## 2015-04-01 MED ORDER — ZOLPIDEM TARTRATE 5 MG PO TABS: 5.0000 mg | ORAL_TABLET | Freq: Every evening | ORAL | Status: DC | PRN

## 2015-04-01 MED ORDER — ATORVASTATIN CALCIUM 80 MG PO TABS: 80.0000 mg | ORAL_TABLET | Freq: Every day | ORAL | Status: DC

## 2015-04-01 MED ORDER — CENTRUM SILVER PO TABS: 1.0000 | ORAL_TABLET | Freq: Every day | ORAL | Status: DC

## 2015-04-01 MED ORDER — POTASSIUM CHLORIDE CRYS ER 10 MEQ PO TBCR
10.0000 meq | EXTENDED_RELEASE_TABLET | Freq: Every day | ORAL | Status: DC
  Administered 2015-04-02 – 2015-04-05 (×4): 10 meq via ORAL
  Filled 2015-04-01 (×14): qty 1

## 2015-04-01 MED ORDER — NITROGLYCERIN 0.4 MG SL SUBL: 0.4000 mg | SUBLINGUAL_TABLET | SUBLINGUAL | Status: DC | PRN

## 2015-04-01 MED ORDER — ACETAMINOPHEN 325 MG PO TABS: 650.0000 mg | ORAL_TABLET | ORAL | Status: DC | PRN

## 2015-04-01 MED ORDER — SODIUM CHLORIDE 0.9% FLUSH: 3.0000 mL | INTRAVENOUS | Status: DC | PRN

## 2015-04-01 MED ORDER — ASPIRIN EC 81 MG PO TBEC
81.0000 mg | DELAYED_RELEASE_TABLET | Freq: Every day | ORAL | Status: DC
  Administered 2015-04-01 – 2015-04-05 (×5): 81 mg via ORAL
  Filled 2015-04-01 (×5): qty 1

## 2015-04-01 MED ORDER — HEPARIN (PORCINE) IN NACL 100-0.45 UNIT/ML-% IJ SOLN
1500.0000 [IU]/h | INTRAMUSCULAR | Status: DC
  Administered 2015-04-01: 1500 [IU]/h via INTRAVENOUS
  Filled 2015-04-01: qty 250

## 2015-04-01 MED ORDER — ALPRAZOLAM 0.25 MG PO TABS: 0.2500 mg | ORAL_TABLET | Freq: Two times a day (BID) | ORAL | Status: DC | PRN

## 2015-04-01 MED ORDER — ATORVASTATIN CALCIUM 80 MG PO TABS
80.0000 mg | ORAL_TABLET | Freq: Every day | ORAL | Status: DC
  Administered 2015-04-02 – 2015-04-12 (×10): 80 mg via ORAL
  Filled 2015-04-01 (×11): qty 1

## 2015-04-01 NOTE — Progress Notes (Signed)
ANTICOAGULATION CONSULT NOTE - Initial Consult  Pharmacy Consult:  Heparin Indication: chest pain/ACS  No Known Allergies  Patient Measurements: Height: 5\' 7"  (170.2 cm) Weight: 182 lb 1.6 oz (82.6 kg) IBW/kg (Calculated) : 66.1 Heparin Dosing Weight: 83 kg  Vital Signs: Temp: 98 F (36.7 C) (03/31 1424) Temp Source: Oral (03/31 1424) BP: 86/45 mmHg (03/31 1424) Pulse Rate: 55 (03/31 1424)  Labs: No results for input(s): HGB, HCT, PLT, APTT, LABPROT, INR, HEPARINUNFRC, HEPRLOWMOCWT, CREATININE, CKTOTAL, CKMB, TROPONINI in the last 72 hours.  Estimated Creatinine Clearance: 62.3 mL/min (by C-G formula based on Cr of 1.15).   Medical History: Past Medical History  Diagnosis Date  . OSA (obstructive sleep apnea)     not using CPAP at home, does have machine  . Hypertension   . Hyperlipidemia   . STEMI (ST elevation myocardial infarction) ([place]) 12/07/2014    100% stenosis of RCA w/ PCI using DES. 85-95% stenosis in the LAD and will need future staged PCI of the lesion.   . Paroxysmal atrial fibrillation ([place]) 12/12/2014  . Pulmonary embolism ([place]) 12/12/2014       Assessment: 73 YOM with a significant cardiac history to start IV heparin for ACS.  Of note, patient has a history of PE in December of 2016 and was placed on Xarelto.  Patient reported he was instructed to stop taking Xarelto and his last dose was sometimes this week.  He last took Plavix yesterday.  CABG planned for 04/06/15.  Baseline aPTT and heparin level are low.   Goal of Therapy:  Heparin level 0.3-0.7 units/ml Monitor platelets by anticoagulation protocol: Yes    Plan:  - Heparin 4000 units IV bolus x 1, then - Heparin gtt at 1500 units/hr - Check 6 hr HL - Daily HL / CBC    Aeryn Medici D. Mina Marble, PharmD, BCPS Pager:  [phone] 04/01/2015, 5:02 PM

## 2015-04-01 NOTE — Telephone Encounter (Signed)
Spoke to patient Dr.Jordan advised to stop plavix needs to be admitted to [facility] for CABG on 04/06/15 with Dr.Gerhardt,will need to be started on IV heparin.Bed Mgt called telemetry bed available.Advised go to [facility] admitting.Stated he is presently at his [place] home will take him 2 hours.Bed Mgt was told they will hold bed for 2 hours.Trish called.

## 2015-04-01 NOTE — H&P (Signed)
History and Physical   Patient ID: Ryan Ward MRN: [id number], DOB/AGE: 01/07/1945 70 y.o. Date of Encounter: 04/01/2015  Primary Physician: Leonard Downing, MD Primary Cardiologist: Dr Ward  Chief Complaint:  CAD  HPI: Ryan Ward is a 70 y.o. male with a history of inferior STEMI [id number] complicated by complete heart block and cardiogenic shock. He was managed with vasopressors and temporary pacemaker. He had an IABP placed. LHC demonstrated severe 3 vessel CAD with an occluded RCA, severe proximal mid LAD disease and moderate to severe ostial LCx disease (small vessel). He underwent PCI with a DES to the RCA. Hospitalization was complicated by VF arrest post PCI related to pacemaker spike on T-wave. EF was 35-45% by cardiac catheterization. Follow-up echo demonstrated EF 50-55%. He developed volume excess and required IV Lasix. Hospitalization was further complicated by R posterior tibial and peroneal vein DVT and PE. He was placed on heparin and transitioned to Xarelto; Brilinta was stopped and he was placed on Plavix. He developed atrial fibrillation and converted to NSR on IV amiodarone. He had recurrent atrial fibrillation and diltiazem was discontinued. He was placed on amiodarone.D/c 12/15/2014.  03/09, seen by Dr Ward and referred to [facility] for consideration of CABG. His plan is reproduced here:  1. CAD - s/p Large inferior myocardial infarction complicated by cardiogenic shock and microcirculatory embolization at the time of PCI.Cath showed 100% stenosis of RCA with successful PCI using DES. Also had significant residual disease in the left coronary system. MI also complicated by Vfib, PE, and atrial fibrillation. He has made a very good recovery. On Xarelto and Plavix currently.       I reviewed the prior cath films with the patient and his wife today. His Left coronary disease is complex. The LAD is calcified and tortuous. There is diffuse disease in the  proximal LAD with 2 moderate sized diagonal branches arising from this segment. There is a 95% mid LAD stenosis just prior to an acute bend. The LCX is small with ostial disease. This does not appear to supply much myocardium. RCA was very large and supplied PL branches. Although acute reperfusion was obtained with stent in distal RCA there is moderate diffuse disease in the proximal and mid RCA. After review of films we discussed treatment options. This includes possible percutaneous options and CABG. From a PCI standpoint this would be a Complex high risk intervention (CHIP) case due to anatomic features noted above. While I think PCI could be done his risk would be higher with 5-6% risk of peri-procedural MI and higher risk of recurrent events and need for repeat procedures. I think that CABG may be the better option in the long term and I have requested consultation with CT surgery. He has recovered enough now from his acute episode that I think he would tolerate surgery well. By that time he would be able to come off Xarelto (3-6 months therapy for PE). We would need to consider how best to manage his antiplatelet therapy. He is now 4 months out from MI so ideally if we could hold off until 6 months we could consider ASA only and holding Plavix for surgery. If Surgery does not feel he is a good candidate or if he refuses surgery we could reconsider high risk PCI.  2. Ventricular fibrillation - VF arrest post PCI. Records indicate pacer spike on TW likely cause.EF recovered by FU echo.  3. Chronic diastolic CHF - He has only mild edema. I am  reluctant to increase diuretics more due to low BP. 4. PAF - He reamins in NSR. Will stop amiodarone since AFib clearly triggered by MI and PE.  5. Pulmonary Embolism - CTA on 12/12/2014 showed a peripheral upper lobe pulmonary emboli, largest present in the anterior segment right upper lobe pulmonary artery. No evidence of right heart strain. LE dopplers confirmed  RLE DVT. He is on Xarelto 20 QD. Recommend 3-6 months of therapy. 6. HTN - BP soft. He is not symptomatic. Continue low dose metoprolol. 7. Hyperlipidemia - Continue statin- excellent control.   03/16, seen by Dr Servando Snare and echo recommended prior to consideration of CABG. Echo performed and showed  03/21, echo performed and showed a decreased EF. 12/07/2014 echo demonstrated EF 55%, now 30-35%. Cath recommended.  03/28 pt came to the hospital for right and left cardiac catheterization. CABG after Plavix washout and diuresis for elevated R heart pressures was felt to be the best option.   03/30, seen by Dr Servando Snare and CABG scheduled for next week with internal discontinuation of Plavix.   03/31, pt came in for admission pre-CABG. He is to be maintained on heparin with anti-platelet agents and oral anticoagulants held.  Ryan Ward has not had chest pain recently. He has not been very active, but is looking forward to getting back to cardiac rehab. He has orthostatic dizziness, but no SOB, orthopnea or PND. No LE edema.  Past Medical History  Diagnosis Date  . OSA (obstructive sleep apnea)     not using CPAP at home, does have machine  . Hypertension   . Hyperlipidemia   . STEMI (ST elevation myocardial infarction) ([place]) 12/07/2014    100% stenosis of RCA w/ PCI using DES. 85-95% stenosis in the LAD and will need future staged PCI of the lesion.   . Paroxysmal atrial fibrillation ([place]) 12/12/2014  . Pulmonary embolism ([place]) 12/12/2014    Surgical History:  Past Surgical History  Procedure Laterality Date  . Hernia repair    . Cardiac catheterization N/A 12/07/2014    Procedure: Coronary Stent Intervention;  Surgeon: Antonyo Hinderer M Martinique, MD;  Location: [facility];  Service: Cardiovascular;  Laterality: N/A;  STEMI  . Cardiac catheterization N/A 12/07/2014    Procedure: IABP Insertion;  Surgeon: Dishon Kehoe M Martinique, MD;  Location: [facility];  Service: Cardiovascular;   Laterality: N/A;  . Cardiac catheterization N/A 12/07/2014    Procedure: Temporary Pacemaker;  Surgeon: Rani Sisney M Martinique, MD;  Location: [facility];  Service: Cardiovascular;  Laterality: N/A;  . Cardiac catheterization N/A 12/07/2014    Procedure: Left Heart Cath and Coronary Angiography;  Surgeon: Liliah Dorian M Martinique, MD;  Location: [facility];  Service: Cardiovascular;  Laterality: N/A;  . Cardiac catheterization N/A 03/29/2015    Procedure: Right/Left Heart Cath and Coronary Angiography;  Surgeon: Thai Hemrick M Martinique, MD;  Location: [facility];  Service: Cardiovascular;  Laterality: N/A;     I have reviewed the patient's current medications. Medication Sig  aspirin EC 81 MG tablet Take 1 tablet (81 mg total) by mouth daily.  atorvastatin (LIPITOR) 80 MG tablet Take 1 tablet (80 mg total) by mouth daily at 6 PM.  Cholecalciferol (VITAMIN D3) 5000 UNITS TABS Take 1 tablet by mouth daily.  furosemide (LASIX) 40 MG tablet Take 1 tablet (40 mg total) by mouth as directed. Take 40 mg with 20 mg lasix to = 60 mg x 3 days; then go to lasix 40 mg  daily Patient taking differently: Take 40 mg by mouth daily. Currently taking Lasix 40mg  daily  metoprolol tartrate (LOPRESSOR) 25 MG tablet Take 0.5 tablets (12.5 mg total) by mouth 2 (two) times daily.  Multiple Vitamins-Minerals (CENTRUM SILVER) tablet Take 1 tablet by mouth daily.  nitroGLYCERIN (NITROSTAT) 0.4 MG SL tablet PLACE 1 TABLET UNDER TONGUE EVERY 5MINUTES X 3 DOSES AS NEEDED FOR CHEST PAIN  potassium chloride (K-DUR) 10 MEQ tablet Take 1 tablet (10 mEq total) by mouth as directed. Take 20 meq daily x 3 days then go to 10 meq daily Patient taking differently: Take 10 mEq by mouth daily. Currently taking 69meq daily   Scheduled Meds: Continuous Infusions: PRN Meds:.  Allergies: No Known Allergies  Social History   Social History  . Marital Status: Significant Other    Spouse Name: N/A  . Number of Children: N/A  . Years of  Education: N/A   Occupational History  . Not on file.   Social History Main Topics  . Smoking status: Never Smoker   . Smokeless tobacco: Not on file  . Alcohol Use: No  . Drug Use: No  . Sexual Activity: Not on file   Other Topics Concern  . Not on file   Social History Narrative   Works with [name]    Family History  Problem Relation Age of Onset  . Heart attack Father    Family Status  Relation Status Death Age  . Father Deceased   . Mother Deceased     Review of Systems:   Full 14-point review of systems otherwise negative except as noted above.  Physical Exam: Blood pressure 86/45, pulse 55, temperature 98 F (36.7 C), temperature source Oral, resp. rate 18, height 5\' 7"  (1.702 m), weight 182 lb 1.6 oz (82.6 kg), SpO2 96 %. General: Well developed, well nourished,male in no acute distress. Head: Normocephalic, atraumatic, sclera non-icteric, no xanthomas, nares are without discharge. Dentition: good Neck: No carotid bruits. JVD not elevated. No thyromegally Lungs: Good expansion bilaterally. without wheezes or rhonchi.  Heart: Regular rate and rhythm with S1 S2.  No S3 or S4.  No murmur, no rubs, or gallops appreciated. Abdomen: Soft, non-tender, non-distended with normoactive bowel sounds. No hepatomegaly. No rebound/guarding. No obvious abdominal masses. Msk:  Strength and tone appear normal for age. No joint deformities or effusions, no spine or costo-vertebral angle tenderness. Extremities: No clubbing or cyanosis. No edema.  Distal pedal pulses are 2+ in 4 extrem Neuro: Alert and oriented X 3. Moves all extremities spontaneously. No focal deficits noted. Psych:  Responds to questions appropriately with a normal affect. Skin: No rashes or lesions noted  Labs:   Lab Results  Component Value Date   WBC 6.0 03/24/2015   HGB 12.8* 03/24/2015   HCT 39.4 03/24/2015   MCV 87.0 03/24/2015   PLT 151 03/24/2015   ECG: 03/29/2015 SR, low voltage, inferior Q  waves, incomplete RBBB  Additional studies/ records that were reviewed today include:  Echo 03/22/2014 - Left ventricle: The cavity size was mildly dilated. Wall  thickness was normal. Systolic function was moderately to  severely reduced. The estimated ejection fraction was in the  range of 30% to 35%. Diffuse hypokinesis. - Mitral valve: There was mild regurgitation. - Right atrium: The atrium was moderately dilated. - Pulmonary arteries: Systolic pressure was moderately increased.  PA peak pressure: 49 mm Hg (S).  LHC 03/29/2015  Prox LAD to Mid LAD lesion, 85% stenosed.  Mid LAD to [facility]  LAD lesion, 95% stenosed.  Ost Cx lesion, 80% stenosed.  Prox RCA to Mid RCA lesion, 30% stenosed.  Mid RCA to Dist RCA lesion, 40% stenosed.  RPDA lesion, 75% stenosed.  There is moderate to severe left ventricular systolic dysfunction. 1. Severe 3 vessel obstructive CAD. Stent in the distal RCA is widely patent.  2. Moderate to severe LV dysfunction with EF 30-35% 3. Mild to moderate pulmonary HTN with elevated LV filling pressures. Prominent V wave on PCWP tracing due to decreased LV compliance.  4. Normal cardiac output. Plan: Revascularization with CABG.      ASSESSMENT AND PLAN:  Principal Problem:   CAD S/P percutaneous coronary angioplasty - heparin, d/c Plavix - continue low-dose metoprolol as BP will allow - continue high-dose statin - CABG 04/04  Active Problems:   HTN (hypertension)>>Hypotension - BP currently low  - will add parameters to the metoprolol -     Paroxysmal atrial fibrillation (HCC) - SR at this time - continue BB, if recurrence, may need amio    Chronic combined systolic and diastolic CHF (congestive heart failure) ([place]) - acute component as well. - will diurese with Lasix 40 mg IV qd - follow I/O, weights, BMET  Signed, Lenoard Aden 04/01/2015 4:09 PM Beeper [id number]  Patient seen and examined and history reviewed. Agree  with above findings and plan. 70 yo WM well known to me. Prior very complicated inferior MI in Dec. He has recovered well but still has evidence of CHF. See recent cardiac cath data. Severe complex LAD disease. Needs revascularization with surgery. Plan to hold Plavix. Will use IV heparin to reduce risk of stent thrombosis since he is only 4 months out from MI. Will give IV lasix to try and tune up his CHF some more prior to surgery. Will need to monitor closely since BP tends to run low.  Plan CABG on Wednesday.  Ryan Ward, [place] 04/01/2015 5:36 PM

## 2015-04-02 ENCOUNTER — Inpatient Hospital Stay (HOSPITAL_COMMUNITY): Payer: 59

## 2015-04-02 DIAGNOSIS — I2510 Atherosclerotic heart disease of native coronary artery without angina pectoris: Secondary | ICD-10-CM

## 2015-04-02 LAB — BASIC METABOLIC PANEL
Anion gap: 9 (ref 5–15)
BUN: 16 mg/dL (ref 6–20)
CHLORIDE: 105 mmol/L (ref 101–111)
CO2: 28 mmol/L (ref 22–32)
Calcium: 8.8 mg/dL — ABNORMAL LOW (ref 8.9–10.3)
Creatinine, Ser: 1.2 mg/dL (ref 0.61–1.24)
GFR calc non Af Amer: 60 mL/min — ABNORMAL LOW (ref >60)
Glucose, Bld: 92 mg/dL (ref 65–99)
POTASSIUM: 3.7 mmol/L (ref 3.5–5.1)
SODIUM: 142 mmol/L (ref 135–145)

## 2015-04-02 LAB — CBC
HEMATOCRIT: 39.4 % (ref 39.0–52.0)
Hemoglobin: 12.7 g/dL — ABNORMAL LOW (ref 13.0–17.0)
MCH: 27.7 pg (ref 26.0–34.0)
MCHC: 32.2 g/dL (ref 30.0–36.0)
MCV: 85.8 fL (ref 78.0–100.0)
Platelets: 192 10*3/uL (ref 150–400)
RBC: 4.59 MIL/uL (ref 4.22–5.81)
RDW: 15.9 % — AB (ref 11.5–15.5)
WBC: 7 10*3/uL (ref 4.0–10.5)

## 2015-04-02 LAB — HEPARIN LEVEL (UNFRACTIONATED)
HEPARIN UNFRACTIONATED: 1.2 [IU]/mL — AB (ref 0.30–0.70)
HEPARIN UNFRACTIONATED: 0.92 [IU]/mL — AB (ref 0.30–0.70)

## 2015-04-02 MED ORDER — HEPARIN (PORCINE) IN NACL 100-0.45 UNIT/ML-% IJ SOLN
1200.0000 [IU]/h | INTRAMUSCULAR | Status: DC
  Administered 2015-04-02 (×2): 1200 [IU]/h via INTRAVENOUS
  Filled 2015-04-02: qty 250

## 2015-04-02 MED ORDER — HEPARIN (PORCINE) IN NACL 100-0.45 UNIT/ML-% IJ SOLN
1000.0000 [IU]/h | INTRAMUSCULAR | Status: DC
  Administered 2015-04-02 – 2015-04-05 (×3): 1000 [IU]/h via INTRAVENOUS
  Filled 2015-04-02 (×4): qty 250

## 2015-04-02 MED ORDER — PSYLLIUM 95 % PO PACK
1.0000 | PACK | Freq: Every day | ORAL | Status: DC
  Administered 2015-04-02 – 2015-04-05 (×4): 1 via ORAL
  Filled 2015-04-02 (×4): qty 1

## 2015-04-02 NOTE — Progress Notes (Signed)
SUBJECTIVE:  No chest pain.     PHYSICAL EXAM Filed Vitals:   04/01/15 1956 04/02/15 0431 04/02/15 0434 04/02/15 0943  BP: 95/50 91/53  90/54  Pulse: 56 52  58  Temp: 98.1 F (36.7 C) 97.7 F (36.5 C)    TempSrc: Oral Oral    Resp: 18 16    Height:      Weight:   182 lb 9.6 oz (82.827 kg)   SpO2: 95% 93%     General:  No distress Lungs:  clear Heart:  RRR Abdomen:  Positive bowel sounds, no rebound no guarding Extremities:  No edema   LABS:  Results for orders placed or performed during the hospital encounter of 04/01/15 (from the past 24 hour(s))  Comprehensive metabolic panel     Status: Abnormal   Collection Time: 04/01/15  4:22 PM  Result Value Ref Range   Sodium 142 135 - 145 mmol/L   Potassium 3.9 3.5 - 5.1 mmol/L   Chloride 107 101 - 111 mmol/L   CO2 28 22 - 32 mmol/L   Glucose, Bld 103 (H) 65 - 99 mg/dL   BUN 15 6 - 20 mg/dL   Creatinine, Ser 1.17 0.61 - 1.24 mg/dL   Calcium 8.9 8.9 - 10.3 mg/dL   Total Protein 4.9 (L) 6.5 - 8.1 g/dL   Albumin 3.2 (L) 3.5 - 5.0 g/dL   AST 28 15 - 41 U/L   ALT 37 17 - 63 U/L   Alkaline Phosphatase 68 38 - 126 U/L   Total Bilirubin 0.5 0.3 - 1.2 mg/dL   GFR calc non Af Amer >60 >60 mL/min   GFR calc Af Amer >60 >60 mL/min   Anion gap 7 5 - 15  CBC WITH DIFFERENTIAL     Status: Abnormal   Collection Time: 04/01/15  4:22 PM  Result Value Ref Range   WBC 6.3 4.0 - 10.5 K/uL   RBC 4.49 4.22 - 5.81 MIL/uL   Hemoglobin 12.5 (L) 13.0 - 17.0 g/dL   HCT 38.9 (L) 39.0 - 52.0 %   MCV 86.6 78.0 - 100.0 fL   MCH 27.8 26.0 - 34.0 pg   MCHC 32.1 30.0 - 36.0 g/dL   RDW 16.0 (H) 11.5 - 15.5 %   Platelets 180 150 - 400 K/uL   Neutrophils Relative % 54 %   Neutro Abs 3.4 1.7 - 7.7 K/uL   Lymphocytes Relative 26 %   Lymphs Abs 1.6 0.7 - 4.0 K/uL   Monocytes Relative 15 %   Monocytes Absolute 0.9 0.1 - 1.0 K/uL   Eosinophils Relative 4 %   Eosinophils Absolute 0.2 0.0 - 0.7 K/uL   Basophils Relative 1 %   Basophils Absolute  0.1 0.0 - 0.1 K/uL  Protime-INR     Status: Abnormal   Collection Time: 04/01/15  4:22 PM  Result Value Ref Range   Prothrombin Time 15.7 (H) 11.6 - 15.2 seconds   INR 1.23 0.00 - 1.49  APTT     Status: None   Collection Time: 04/01/15  4:22 PM  Result Value Ref Range   aPTT 28 24 - 37 seconds  Heparin level (unfractionated)     Status: Abnormal   Collection Time: 04/01/15  4:22 PM  Result Value Ref Range   Heparin Unfractionated <0.10 (L) 0.30 - 0.70 IU/mL  Basic metabolic panel     Status: Abnormal   Collection Time: 04/02/15  2:16 AM  Result Value Ref Range  Sodium 142 135 - 145 mmol/L   Potassium 3.7 3.5 - 5.1 mmol/L   Chloride 105 101 - 111 mmol/L   CO2 28 22 - 32 mmol/L   Glucose, Bld 92 65 - 99 mg/dL   BUN 16 6 - 20 mg/dL   Creatinine, Ser 1.20 0.61 - 1.24 mg/dL   Calcium 8.8 (L) 8.9 - 10.3 mg/dL   GFR calc non Af Amer 60 (L) >60 mL/min   GFR calc Af Amer >60 >60 mL/min   Anion gap 9 5 - 15  Heparin level (unfractionated)     Status: Abnormal   Collection Time: 04/02/15  2:16 AM  Result Value Ref Range   Heparin Unfractionated 1.20 (H) 0.30 - 0.70 IU/mL  CBC     Status: Abnormal   Collection Time: 04/02/15  2:16 AM  Result Value Ref Range   WBC 7.0 4.0 - 10.5 K/uL   RBC 4.59 4.22 - 5.81 MIL/uL   Hemoglobin 12.7 (L) 13.0 - 17.0 g/dL   HCT 39.4 39.0 - 52.0 %   MCV 85.8 78.0 - 100.0 fL   MCH 27.7 26.0 - 34.0 pg   MCHC 32.2 30.0 - 36.0 g/dL   RDW 15.9 (H) 11.5 - 15.5 %   Platelets 192 150 - 400 K/uL    Intake/Output Summary (Last 24 hours) at 04/02/15 1207 Last data filed at 04/02/15 0900  Gross per 24 hour  Intake    240 ml  Output   2195 ml  Net  -1955 ml    ASSESSMENT AND PLAN:   CAD:  CABG on Wed.  Plavix on hold.    On heparin with history of PAF and PE.    CHRONIC DIASTOLIC HF:  Euvolemic.     Jeneen Rinks [facility] 04/02/2015 12:07 PM

## 2015-04-02 NOTE — Progress Notes (Signed)
RN notified of patients heparin level being supra therap, orders given to hold heparin for an hour and restart at 0430 at 1200 units/hr. Patient currently has no issues at this time, will continue to monitor.  Sharene Skeans, RN BSN

## 2015-04-02 NOTE — Progress Notes (Signed)
Pre-op Cardiac Surgery  Carotid Findings:  1-395 ICA plaquing.  Vertebral artery flow is antegrade.   Upper Extremity Right Left  Brachial Pressures 96T 93T  Radial Waveforms T T  Ulnar Waveforms T T  Palmar Arch (Allen's Test) Doppler remains normal with radial compression and diminishes >50% with ulnar compression WNL   Findings:      Lower  Extremity Right Left  Dorsalis Pedis    Anterior Tibial    Posterior Tibial    Ankle/Brachial Indices      Findings:

## 2015-04-02 NOTE — Progress Notes (Signed)
Pt is requesting order for metamucil, which he takes daily at home. PA has been paged.

## 2015-04-02 NOTE — Progress Notes (Signed)
Cardiac Rehab Phase I  Reviewed per-op [facility] education with pt and significant other.  Answered several questions as well.  Pt voiced understanding and was encouraged to ask any other question leading up to surgery on Wednesday.  Alberteen Sam, [place], [place] [phone]

## 2015-04-02 NOTE — Progress Notes (Signed)
Right Lower Extremity Vein Map    Right Great Saphenous Vein   Segment Diameter Comment  1. Origin 5.65mm   2. High Thigh 4.78mm   3. Mid Thigh 2.79mm branch  4. Low Thigh 2.15mm   5. At Knee 2.85mm   6. High Calf 2.60mm   7. Low Calf 3.82mm branch  8. Ankle 89mm branch   mm    mm    mm      Left Lower Extremity Vein Map    Left Great Saphenous Vein   Segment Diameter Comment  1. Origin 32.mm   2. High Thigh 2.80mm   3. Mid Thigh 2.44mm   4. Low Thigh 1.17mm   5. At Knee 3.7mm   6. High Calf 1.22mm branch  7. Low Calf 2.79mm   8. Ankle 2.64mm    mm    mm    mm

## 2015-04-02 NOTE — Progress Notes (Signed)
ANTICOAGULATION CONSULT NOTE  Pharmacy Consult:  Heparin Indication: chest pain/ACS  No Known Allergies  Patient Measurements: Height: 5\' 7"  (170.2 cm) Weight: 182 lb 9.6 oz (82.827 kg) IBW/kg (Calculated) : 66.1 Heparin Dosing Weight: 83 kg  Vital Signs: Temp: 97.7 F (36.5 C) (04/01 0431) Temp Source: Oral (04/01 0431) BP: 90/54 mmHg (04/01 0943) Pulse Rate: 58 (04/01 0943)  Labs:  Recent Labs  04/01/15 1622 04/02/15 0216  HGB 12.5* 12.7*  HCT 38.9* 39.4  PLT 180 192  APTT 28  --   LABPROT 15.7*  --   INR 1.23  --   HEPARINUNFRC <0.10* 1.20*  CREATININE 1.17 1.20    Estimated Creatinine Clearance: 59.8 mL/min (by C-G formula based on Cr of 1.2).   Medical History: Past Medical History  Diagnosis Date  . OSA (obstructive sleep apnea)     not using CPAP at home, does have machine  . Hypertension   . Hyperlipidemia   . STEMI (ST elevation myocardial infarction) ([place]) 12/07/2014    100% stenosis of RCA w/ PCI using DES. 85-95% stenosis in the LAD and will need future staged PCI of the lesion.   . Paroxysmal atrial fibrillation ([place]) 12/12/2014  . Pulmonary embolism ([place]) 12/12/2014  . Coronary artery disease   . Asthma     VERY MILD    Assessment: 44 YOM with a significant cardiac history to start IV heparin for ACS. Of note, patient has a history of PE in December of 2016 and was placed on Xarelto. Patient reported he was instructed to stop taking Xarelto, and his last dose was sometime this week. CABG planned for 04/06/15. Baseline aPTT and heparin level are low.  HL remains supratherapeutic (0.92) on 1200 units/h after rate decrease. Appears to have been drawn appropriately per RN. Hg low stable, plt wnl. No bleed noted.  Goal of Therapy:  Heparin level 0.3-0.7 units/ml Monitor platelets by anticoagulation protocol: Yes    Plan:  Hold heparin drip x 1 hour - communicated with RN Restart heparin at 1000 units/h at 1530 6h HL, daily HL/CBC Monitor s/sx  bleeding CABG 4/5  Elicia Lamp, PharmD, [facility] Clinical Pharmacist Pager [phone] 04/02/2015 11:13 AM

## 2015-04-02 NOTE — Progress Notes (Signed)
ANTICOAGULATION CONSULT NOTE - Follow Up Consult  Pharmacy Consult for Heparin  Indication: CABG 4/5  No Known Allergies  Patient Measurements: Height: 5\' 7"  (170.2 cm) Weight: 182 lb 1.6 oz (82.6 kg) IBW/kg (Calculated) : 66.1  Vital Signs: Temp: 98.1 F (36.7 C) (03/31 1956) Temp Source: Oral (03/31 1956) BP: 95/50 mmHg (03/31 1956) Pulse Rate: 56 (03/31 1956)  Labs:  Recent Labs  04/01/15 1622 04/02/15 0216  HGB 12.5* 12.7*  HCT 38.9* 39.4  PLT 180 192  APTT 28  --   LABPROT 15.7*  --   INR 1.23  --   HEPARINUNFRC <0.10* 1.20*  CREATININE 1.17  --     Estimated Creatinine Clearance: 61.3 mL/min (by C-G formula based on Cr of 1.17).  Assessment: Heparin while awaiting CABG, initial heparin level is supra-therapeutic, no issues per RN.   Goal of Therapy:  Heparin level 0.3-0.7 units/ml Monitor platelets by anticoagulation protocol: Yes   Plan:  -Hold heparin x 1 hr -Re-start heparin infusion at 1200 units/hr at 0430 -1300 HL  [name] 04/02/2015,3:27 AM

## 2015-04-03 LAB — CBC
HEMATOCRIT: 41.5 % (ref 39.0–52.0)
Hemoglobin: 13.6 g/dL (ref 13.0–17.0)
MCH: 28.3 pg (ref 26.0–34.0)
MCHC: 32.8 g/dL (ref 30.0–36.0)
MCV: 86.5 fL (ref 78.0–100.0)
PLATELETS: 215 10*3/uL (ref 150–400)
RBC: 4.8 MIL/uL (ref 4.22–5.81)
RDW: 13.5 % (ref 11.5–15.5)
WBC: 9.5 10*3/uL (ref 4.0–10.5)

## 2015-04-03 LAB — BASIC METABOLIC PANEL
Anion gap: 9 (ref 5–15)
BUN: 14 mg/dL (ref 6–20)
CHLORIDE: 105 mmol/L (ref 101–111)
CO2: 27 mmol/L (ref 22–32)
CREATININE: 1.14 mg/dL (ref 0.61–1.24)
Calcium: 8.9 mg/dL (ref 8.9–10.3)
GFR calc Af Amer: >60 mL/min (ref >60)
GFR calc non Af Amer: >60 mL/min (ref >60)
GLUCOSE: 94 mg/dL (ref 65–99)
POTASSIUM: 3.6 mmol/L (ref 3.5–5.1)
SODIUM: 141 mmol/L (ref 135–145)

## 2015-04-03 LAB — HEPARIN LEVEL (UNFRACTIONATED)
HEPARIN UNFRACTIONATED: 0.44 [IU]/mL (ref 0.30–0.70)
Heparin Unfractionated: 0.42 IU/mL (ref 0.30–0.70)

## 2015-04-03 LAB — GLUCOSE, CAPILLARY: Glucose-Capillary: 77 mg/dL (ref 65–99)

## 2015-04-03 NOTE — Progress Notes (Signed)
ANTICOAGULATION CONSULT NOTE Pharmacy Consult:  Heparin Indication: chest pain/ACS  No Known Allergies  Patient Measurements: Height: 5\' 7"  (170.2 cm) Weight: 182 lb 9.6 oz (82.827 kg) IBW/kg (Calculated) : 66.1 Heparin Dosing Weight: 83 kg  Vital Signs: Temp: 97.8 F (36.6 C) (04/01 2150) Temp Source: Oral (04/01 2150) BP: 102/59 mmHg (04/01 2150) Pulse Rate: 55 (04/01 2150)  Labs:  Recent Labs  04/01/15 1622 04/02/15 0216 04/02/15 1225 04/02/15 2242  HGB 12.5* 12.7*  --   --   HCT 38.9* 39.4  --   --   PLT 180 192  --   --   APTT 28  --   --   --   LABPROT 15.7*  --   --   --   INR 1.23  --   --   --   HEPARINUNFRC <0.10* 1.20* 0.92* 0.44  CREATININE 1.17 1.20  --   --     Estimated Creatinine Clearance: 59.8 mL/min (by C-G formula based on Cr of 1.2).  Assessment: 70 yo male with h/o Afib/PE, Xarelto on hold for CABG 4/5, for heparin  Goal of Therapy:  Heparin level 0.3-0.7 units/ml Monitor platelets by anticoagulation protocol: Yes    Plan:  Continue Heparin at current rate  Follow-up am labs.  Phillis Knack, PharmD, BCPS   04/03/2015 12:27 AM

## 2015-04-03 NOTE — Progress Notes (Signed)
ANTICOAGULATION CONSULT NOTE  Pharmacy Consult:  Heparin Indication: chest pain/ACS  No Known Allergies  Patient Measurements: Height: 5\' 7"  (170.2 cm) Weight: 183 lb 4.8 oz (83.144 kg) IBW/kg (Calculated) : 66.1 Heparin Dosing Weight: 83 kg  Vital Signs: Temp: 97.7 F (36.5 C) (04/02 0452) Temp Source: Oral (04/02 0452) BP: 98/58 mmHg (04/02 0902) Pulse Rate: 59 (04/02 0902)  Labs:  Recent Labs  04/01/15 1622 04/02/15 0216 04/02/15 1225 04/02/15 2242 04/03/15 0220  HGB 12.5* 12.7*  --   --  13.6  HCT 38.9* 39.4  --   --  41.5  PLT 180 192  --   --  215  APTT 28  --   --   --   --   LABPROT 15.7*  --   --   --   --   INR 1.23  --   --   --   --   HEPARINUNFRC <0.10* 1.20* 0.92* 0.44 0.42  CREATININE 1.17 1.20  --   --  1.14    Estimated Creatinine Clearance: 63.1 mL/min (by C-G formula based on Cr of 1.14).   Medical History: Past Medical History  Diagnosis Date  . OSA (obstructive sleep apnea)     not using CPAP at home, does have machine  . Hypertension   . Hyperlipidemia   . STEMI (ST elevation myocardial infarction) ([place]) 12/07/2014    100% stenosis of RCA w/ PCI using DES. 85-95% stenosis in the LAD and will need future staged PCI of the lesion.   . Paroxysmal atrial fibrillation ([place]) 12/12/2014  . Pulmonary embolism ([place]) 12/12/2014  . Coronary artery disease   . Asthma     VERY MILD    Assessment: 51 YOM with a significant cardiac history to start IV heparin for ACS. Of note, patient has a history of PE in December of 2016 and was placed on Xarelto. Patient reported he was instructed to stop taking Xarelto, and his last dose was sometime this week. CABG planned for 04/06/15. Baseline aPTT and heparin level are low.  HL now therapeutic x2 (0.42) on 1000 units/h. Hg low stable, plt wnl, no bleed. CABG 4/5.  Goal of Therapy:  Heparin level 0.3-0.7 units/ml Monitor platelets by anticoagulation protocol: Yes  Plan:  Heparin at 1000 units/h Daily  HL/CBC Monitor s/sx bleeding CABG 4/5  Elicia Lamp, PharmD, [facility] Clinical Pharmacist Pager [phone] 04/03/2015 11:09 AM

## 2015-04-03 NOTE — Progress Notes (Signed)
    SUBJECTIVE:  No chest pain.   No events overnight   PHYSICAL EXAM Filed Vitals:   04/02/15 1340 04/02/15 2150 04/03/15 0452 04/03/15 0902  BP: 87/56 102/59 111/60 98/58  Pulse: 59 55 59 59  Temp: 97.6 F (36.4 C) 97.8 F (36.6 C) 97.7 F (36.5 C)   TempSrc: Oral Oral Oral   Resp:  16 16   Height:      Weight:   183 lb 4.8 oz (83.144 kg)   SpO2: 98% 97% 98%    General:  No distress Lungs:  clear Heart:  RRR Abdomen:  Positive bowel sounds, no rebound no guarding Extremities:  No edema   LABS:  Results for orders placed or performed during the hospital encounter of 04/01/15 (from the past 24 hour(s))  Heparin level (unfractionated)     Status: Abnormal   Collection Time: 04/02/15 12:25 PM  Result Value Ref Range   Heparin Unfractionated 0.92 (H) 0.30 - 0.70 IU/mL  Heparin level (unfractionated)     Status: None   Collection Time: 04/02/15 10:42 PM  Result Value Ref Range   Heparin Unfractionated 0.44 0.30 - 0.70 IU/mL  Basic metabolic panel     Status: None   Collection Time: 04/03/15  2:20 AM  Result Value Ref Range   Sodium 141 135 - 145 mmol/L   Potassium 3.6 3.5 - 5.1 mmol/L   Chloride 105 101 - 111 mmol/L   CO2 27 22 - 32 mmol/L   Glucose, Bld 94 65 - 99 mg/dL   BUN 14 6 - 20 mg/dL   Creatinine, Ser 1.14 0.61 - 1.24 mg/dL   Calcium 8.9 8.9 - 10.3 mg/dL   GFR calc non Af Amer >60 >60 mL/min   GFR calc Af Amer >60 >60 mL/min   Anion gap 9 5 - 15  CBC     Status: None   Collection Time: 04/03/15  2:20 AM  Result Value Ref Range   WBC 9.5 4.0 - 10.5 K/uL   RBC 4.80 4.22 - 5.81 MIL/uL   Hemoglobin 13.6 13.0 - 17.0 g/dL   HCT 41.5 39.0 - 52.0 %   MCV 86.5 78.0 - 100.0 fL   MCH 28.3 26.0 - 34.0 pg   MCHC 32.8 30.0 - 36.0 g/dL   RDW 13.5 11.5 - 15.5 %   Platelets 215 150 - 400 K/uL  Heparin level (unfractionated)     Status: None   Collection Time: 04/03/15  2:20 AM  Result Value Ref Range   Heparin Unfractionated 0.42 0.30 - 0.70 IU/mL  Glucose,  capillary     Status: None   Collection Time: 04/03/15  6:28 AM  Result Value Ref Range   Glucose-Capillary 77 65 - 99 mg/dL    Intake/Output Summary (Last 24 hours) at 04/03/15 0916 Last data filed at 04/03/15 0500  Gross per 24 hour  Intake    120 ml  Output   1950 ml  Net  -1830 ml    ASSESSMENT AND PLAN:   CAD:  CABG on Wed.  Plavix on hold.    On heparin with history of PAF and PE.    CHRONIC DIASTOLIC HF:  Euvolemic.     Ryan Ward [facility] 04/03/2015 9:16 AM

## 2015-04-04 ENCOUNTER — Encounter (HOSPITAL_COMMUNITY): Payer: 59

## 2015-04-04 ENCOUNTER — Ambulatory Visit (HOSPITAL_COMMUNITY): Payer: Medicare Other

## 2015-04-04 ENCOUNTER — Inpatient Hospital Stay (HOSPITAL_COMMUNITY): Payer: 59

## 2015-04-04 LAB — PULMONARY FUNCTION TEST
DL/VA % pred: 95 %
DL/VA: 4.19 ml/min/mmHg/L
DLCO cor % pred: 82 %
DLCO cor: 23.27 ml/min/mmHg
DLCO unc % pred: 79 %
DLCO unc: 22.37 ml/min/mmHg
FEF 25-75 Post: 2.98 L/sec
FEF 25-75 Pre: 2.89 L/sec
FEF2575-%Change-Post: 3 %
FEF2575-%Pred-Post: 134 %
FEF2575-%Pred-Pre: 130 %
FEV1-%Change-Post: 0 %
FEV1-%Pred-Post: 101 %
FEV1-%Pred-Pre: 101 %
FEV1-Post: 2.94 L
FEV1-Pre: 2.94 L
FEV1FVC-%Change-Post: 2 %
FEV1FVC-%Pred-Pre: 109 %
FEV6-%Change-Post: -2 %
FEV6-%Pred-Post: 95 %
FEV6-%Pred-Pre: 98 %
FEV6-Post: 3.53 L
FEV6-Pre: 3.63 L
FEV6FVC-%Change-Post: 0 %
FEV6FVC-%Pred-Post: 105 %
FEV6FVC-%Pred-Pre: 106 %
FVC-%Change-Post: -2 %
FVC-%Pred-Post: 90 %
FVC-%Pred-Pre: 92 %
FVC-Post: 3.55 L
FVC-Pre: 3.64 L
Post FEV1/FVC ratio: 83 %
Post FEV6/FVC ratio: 99 %
Pre FEV1/FVC ratio: 81 %
Pre FEV6/FVC Ratio: 100 %
RV % pred: 74 %
RV: 1.7 L
TLC % pred: 85 %
TLC: 5.48 L

## 2015-04-04 LAB — CBC
HEMATOCRIT: 40.9 % (ref 39.0–52.0)
Hemoglobin: 13.3 g/dL (ref 13.0–17.0)
MCH: 27.9 pg (ref 26.0–34.0)
MCHC: 32.5 g/dL (ref 30.0–36.0)
MCV: 85.9 fL (ref 78.0–100.0)
Platelets: 221 10*3/uL (ref 150–400)
RBC: 4.76 MIL/uL (ref 4.22–5.81)
RDW: 16 % — AB (ref 11.5–15.5)
WBC: 7.7 10*3/uL (ref 4.0–10.5)

## 2015-04-04 LAB — BASIC METABOLIC PANEL
Anion gap: 8 (ref 5–15)
BUN: 13 mg/dL (ref 6–20)
CHLORIDE: 107 mmol/L (ref 101–111)
CO2: 27 mmol/L (ref 22–32)
Calcium: 8.9 mg/dL (ref 8.9–10.3)
Creatinine, Ser: 1.07 mg/dL (ref 0.61–1.24)
GFR calc Af Amer: >60 mL/min (ref >60)
GFR calc non Af Amer: >60 mL/min (ref >60)
GLUCOSE: 97 mg/dL (ref 65–99)
POTASSIUM: 3.8 mmol/L (ref 3.5–5.1)
SODIUM: 142 mmol/L (ref 135–145)

## 2015-04-04 LAB — HEPARIN LEVEL (UNFRACTIONATED): Heparin Unfractionated: 0.45 IU/mL (ref 0.30–0.70)

## 2015-04-04 MED ORDER — ALBUTEROL SULFATE (2.5 MG/3ML) 0.083% IN NEBU
2.5000 mg | INHALATION_SOLUTION | Freq: Once | RESPIRATORY_TRACT | Status: AC
  Administered 2015-04-04: 2.5 mg via RESPIRATORY_TRACT

## 2015-04-04 MED ORDER — FUROSEMIDE 40 MG PO TABS
40.0000 mg | ORAL_TABLET | Freq: Every day | ORAL | Status: DC
  Administered 2015-04-04 – 2015-04-05 (×2): 40 mg via ORAL
  Filled 2015-04-04 (×2): qty 1

## 2015-04-04 NOTE — Care Management Note (Signed)
Case Management Note  Patient Details  Name: Jamor Ajayi MRN: [id number] Date of Birth: [birth year]  Subjective/Objective: [name] Pt with CAD s/p inferior MI in Dec 2016 with DES to distal RCA. Large thrombus burden. Residual severe LAD disease. Plan CABG on Wednesday after Plavix washout. On IV heparin to reduce risk of stent thrombosis.  .                   Action/Plan:CM will continue to monitor for disposition needs.   Expected Discharge Date:                  Expected Discharge Plan:  [place]  In-House Referral:     Discharge planning Services  CM Consult  Post Acute Care Choice:    Choice offered to:     DME Arranged:    DME Agency:     HH Arranged:    [place] Agency:     Status of Service:  In process, will continue to follow  Medicare Important Message Given:    Date Medicare IM Given:    Medicare IM give by:    Date Additional Medicare IM Given:    Additional Medicare Important Message give by:     If discussed at [place] of Stay Meetings, dates discussed:    Additional Comments:  Bethena Roys, RN 04/04/2015, 4:18 PM

## 2015-04-04 NOTE — Progress Notes (Signed)
ANTICOAGULATION CONSULT NOTE  Pharmacy Consult:  Heparin Indication: chest pain/ACS  No Known Allergies  Patient Measurements: Height: 5\' 7"  (170.2 cm) Weight: 182 lb 11.2 oz (82.872 kg) IBW/kg (Calculated) : 66.1 Heparin Dosing Weight: 83 kg  Vital Signs: Temp: 97.5 F (36.4 C) (04/03 0535) Temp Source: Oral (04/03 0535) BP: 89/57 mmHg (04/03 0535) Pulse Rate: 58 (04/03 1000)  Labs:  Recent Labs  04/01/15 1622 04/02/15 0216  04/02/15 2242 04/03/15 0220 04/04/15 0425  HGB 12.5* 12.7*  --   --  13.6 13.3  HCT 38.9* 39.4  --   --  41.5 40.9  PLT 180 192  --   --  215 221  APTT 28  --   --   --   --   --   LABPROT 15.7*  --   --   --   --   --   INR 1.23  --   --   --   --   --   HEPARINUNFRC <0.10* 1.20*  < > 0.44 0.42 0.45  CREATININE 1.17 1.20  --   --  1.14 1.07  < > = values in this interval not displayed.  Estimated Creatinine Clearance: 67.1 mL/min (by C-G formula based on Cr of 1.07).   Medical History: Past Medical History  Diagnosis Date  . OSA (obstructive sleep apnea)     not using CPAP at home, does have machine  . Hypertension   . Hyperlipidemia   . STEMI (ST elevation myocardial infarction) ([place]) 12/07/2014    100% stenosis of RCA w/ PCI using DES. 85-95% stenosis in the LAD and will need future staged PCI of the lesion.   . Paroxysmal atrial fibrillation ([place]) 12/12/2014  . Pulmonary embolism ([place]) 12/12/2014  . Coronary artery disease   . Asthma     VERY MILD    Assessment: 8 YOM with a significant cardiac history to start IV heparin for ACS. Of note, patient has a history of PE in December of 2016 and was placed on Xarelto (has been on hold) CABG planned for 04/06/15. Baseline aPTT and heparin level are low. -heparin level= 0.45 and at goal, CBC stable  Goal of Therapy:  Heparin level 0.3-0.7 units/ml Monitor platelets by anticoagulation protocol: Yes  Plan:  Heparin at 1000 units/h Daily HL/CBC CABG on 4/5  Hildred Laser, Pharm D  04/04/2015 11:18 AM

## 2015-04-04 NOTE — Progress Notes (Signed)
Pt's morning BP is low, on assessment, pt has no sign of distress, we'll continue to monitor.

## 2015-04-04 NOTE — Progress Notes (Signed)
TELEMETRY: Reviewed telemetry pt in NSR: Filed Vitals:   04/03/15 0902 04/03/15 1630 04/03/15 2133 04/04/15 0535  BP: 98/58 91/52 104/61 89/57  Pulse: 59 61 63 49  Temp:  97.8 F (36.6 C) 97.8 F (36.6 C) 97.5 F (36.4 C)  TempSrc:  Oral Oral Oral  Resp:  16 16 16   Height:      Weight:    82.872 kg (182 lb 11.2 oz)  SpO2:  96% 97% 97%    Intake/Output Summary (Last 24 hours) at 04/04/15 1009 Last data filed at 04/04/15 0155  Gross per 24 hour  Intake    400 ml  Output    750 ml  Net   -350 ml   Filed Weights   04/02/15 0434 04/03/15 0452 04/04/15 0535  Weight: 82.827 kg (182 lb 9.6 oz) 83.144 kg (183 lb 4.8 oz) 82.872 kg (182 lb 11.2 oz)    Subjective Feels well. No chest pain or dyspnea, LE edema has resolved.   [name] aspirin EC  81 mg Oral Daily  . atorvastatin  80 mg Oral q1800  . cholecalciferol  5,000 Units Oral Daily  . furosemide  40 mg Oral Daily  . metoprolol tartrate  12.5 mg Oral BID  . potassium chloride  10 mEq Oral Daily  . psyllium  1 packet Oral Daily  . sodium chloride flush  3 mL Intravenous Q12H   . heparin 1,000 Units/hr (04/03/15 1239)    LABS: Basic Metabolic Panel:  Recent Labs  04/03/15 0220 04/04/15 0425  NA 141 142  K 3.6 3.8  CL 105 107  CO2 27 27  GLUCOSE 94 97  BUN 14 13  CREATININE 1.14 1.07  CALCIUM 8.9 8.9   Liver Function Tests:  Recent Labs  04/01/15 1622  AST 28  ALT 37  ALKPHOS 68  BILITOT 0.5  PROT 4.9*  ALBUMIN 3.2*   No results for input(s): LIPASE, AMYLASE in the last 72 hours. CBC:  Recent Labs  04/01/15 1622  04/03/15 0220 04/04/15 0425  WBC 6.3  < > 9.5 7.7  NEUTROABS 3.4  --   --   --   HGB 12.5*  < > 13.6 13.3  HCT 38.9*  < > 41.5 40.9  MCV 86.6  < > 86.5 85.9  PLT 180  < > 215 221  < > = values in this interval not displayed. Cardiac Enzymes: No results for input(s): CKTOTAL, CKMB, CKMBINDEX, TROPONINI in the last 72 hours. BNP: No results for input(s): PROBNP in the last 72  hours. D-Dimer: No results for input(s): DDIMER in the last 72 hours. Hemoglobin A1C: No results for input(s): HGBA1C in the last 72 hours. Fasting Lipid Panel: No results for input(s): CHOL, HDL, LDLCALC, TRIG, CHOLHDL, LDLDIRECT in the last 72 hours. Thyroid Function Tests: No results for input(s): TSH, T4TOTAL, T3FREE, THYROIDAB in the last 72 hours.  Invalid input(s): FREET3   Radiology/Studies:  No results found.  PHYSICAL EXAM General: Well developed, well nourished, in no acute distress. Head: Normocephalic, atraumatic, sclera non-icteric, oropharynx is clear Neck: Negative for carotid bruits. JVD not elevated. No adenopathy Lungs: Clear bilaterally to auscultation without wheezes, rales, or rhonchi. Breathing is unlabored. Heart: RRR S1 S2 without murmurs, rubs, or gallops.  Abdomen: Soft, non-tender, non-distended with normoactive bowel sounds. No hepatomegaly. No rebound/guarding. No obvious abdominal masses. Msk:  Strength and tone appears normal for age. Extremities: No clubbing, cyanosis or edema.  Distal pedal pulses are 2+ and equal bilaterally. Neuro:  Alert and oriented X 3. Moves all extremities spontaneously. Psych:  Responds to questions appropriately with a normal affect.  ASSESSMENT AND PLAN: 1. CAD s/p inferior MI in Dec 2016 with DES to distal RCA. Large thrombus burden. Residual severe LAD disease. Plan CABG on Wednesday after Plavix washout. On IV heparin to reduce risk of stent thrombosis. Continue ASA 2. Chronic systolic CHF. EF 30-35%. Good response to IV diuresis with negative > 4 liters since admission. Edema resolved. Will switch IV lasix to po today.  3. Hyperlipidemia on statin. 4. Multiple complications at time of inferior MI including AFib, V fib, PE, CHB. All resolved.   Present on Admission:  . Paroxysmal atrial fibrillation ([place]) . HTN (hypertension) . Chronic combined systolic and diastolic CHF (congestive heart failure) ([place]) . CAD  (coronary artery disease)  Signed, Ryan Ward, [place] 04/04/2015 10:09 AM

## 2015-04-04 NOTE — Progress Notes (Signed)
CARDIAC REHAB PHASE I   PRE:  Rate/Rhythm: 28 SB  BP:  Supine: 90/52  Sitting:   Standing:    SaO2: 98%RA  MODE:  Ambulation: 600 ft   POST:  Rate/Rhythm: 61  BP:  Supine:   Sitting: 88/52  Standing:    SaO2: 100%RA 1107-1132 Pt walked 600 ft with steady gait. Tolerated well. No CP. Wrote down how to view pre op and discharge videos. Pt will have someone with him after discharge 24/7 first week.  Discussed importance of walking and IS after surgery.   Graylon Good, RN BSN  04/04/2015 11:29 AM

## 2015-04-05 ENCOUNTER — Inpatient Hospital Stay (HOSPITAL_COMMUNITY): Payer: 59

## 2015-04-05 DIAGNOSIS — I10 Essential (primary) hypertension: Secondary | ICD-10-CM

## 2015-04-05 LAB — COMPREHENSIVE METABOLIC PANEL
ALT: 32 U/L (ref 17–63)
AST: 31 U/L (ref 15–41)
Albumin: 2.8 g/dL — ABNORMAL LOW (ref 3.5–5.0)
Alkaline Phosphatase: 57 U/L (ref 38–126)
Anion gap: 8 (ref 5–15)
BUN: 15 mg/dL (ref 6–20)
CO2: 28 mmol/L (ref 22–32)
Calcium: 8.9 mg/dL (ref 8.9–10.3)
Chloride: 107 mmol/L (ref 101–111)
Creatinine, Ser: 1.06 mg/dL (ref 0.61–1.24)
GFR calc Af Amer: >60 mL/min (ref >60)
GFR calc non Af Amer: >60 mL/min (ref >60)
Glucose, Bld: 131 mg/dL — ABNORMAL HIGH (ref 65–99)
Potassium: 3.9 mmol/L (ref 3.5–5.1)
Sodium: 143 mmol/L (ref 135–145)
Total Bilirubin: 0.5 mg/dL (ref 0.3–1.2)
Total Protein: 4.9 g/dL — ABNORMAL LOW (ref 6.5–8.1)

## 2015-04-05 LAB — TYPE AND SCREEN
ABO/RH(D): A NEG
Antibody Screen: NEGATIVE

## 2015-04-05 LAB — ABO/RH: ABO/RH(D): A NEG

## 2015-04-05 LAB — CBC
HEMATOCRIT: 38.6 % — AB (ref 39.0–52.0)
HEMOGLOBIN: 12.4 g/dL — AB (ref 13.0–17.0)
MCH: 27.8 pg (ref 26.0–34.0)
MCHC: 32.1 g/dL (ref 30.0–36.0)
MCV: 86.5 fL (ref 78.0–100.0)
Platelets: 225 10*3/uL (ref 150–400)
RBC: 4.46 MIL/uL (ref 4.22–5.81)
RDW: 16.4 % — AB (ref 11.5–15.5)
WBC: 7.3 10*3/uL (ref 4.0–10.5)

## 2015-04-05 LAB — HEPARIN LEVEL (UNFRACTIONATED): Heparin Unfractionated: 0.43 IU/mL (ref 0.30–0.70)

## 2015-04-05 MED ORDER — DEXTROSE 5 % IV SOLN
0.0000 ug/min | INTRAVENOUS | Status: DC
  Filled 2015-04-05: qty 4

## 2015-04-05 MED ORDER — SODIUM CHLORIDE 0.9 % IV SOLN
INTRAVENOUS | Status: DC
  Filled 2015-04-05: qty 2.5

## 2015-04-05 MED ORDER — CHLORHEXIDINE GLUCONATE 0.12 % MT SOLN
15.0000 mL | Freq: Once | OROMUCOSAL | Status: AC
  Administered 2015-04-06: 15 mL via OROMUCOSAL
  Filled 2015-04-05: qty 15

## 2015-04-05 MED ORDER — NITROGLYCERIN IN D5W 200-5 MCG/ML-% IV SOLN
2.0000 ug/min | INTRAVENOUS | Status: DC
  Filled 2015-04-05: qty 250

## 2015-04-05 MED ORDER — METOPROLOL TARTRATE 12.5 MG HALF TABLET
12.5000 mg | ORAL_TABLET | Freq: Once | ORAL | Status: DC
  Filled 2015-04-05: qty 1

## 2015-04-05 MED ORDER — DEXTROSE 5 % IV SOLN
750.0000 mg | INTRAVENOUS | Status: DC
  Filled 2015-04-05: qty 750

## 2015-04-05 MED ORDER — DOPAMINE-DEXTROSE 3.2-5 MG/ML-% IV SOLN
0.0000 ug/kg/min | INTRAVENOUS | Status: DC
  Filled 2015-04-05: qty 250

## 2015-04-05 MED ORDER — PLASMA-LYTE 148 IV SOLN
INTRAVENOUS | Status: DC
  Filled 2015-04-05: qty 2.5

## 2015-04-05 MED ORDER — BISACODYL 5 MG PO TBEC
5.0000 mg | DELAYED_RELEASE_TABLET | Freq: Once | ORAL | Status: DC
  Filled 2015-04-05: qty 1

## 2015-04-05 MED ORDER — POTASSIUM CHLORIDE 2 MEQ/ML IV SOLN
80.0000 meq | INTRAVENOUS | Status: DC
  Filled 2015-04-05: qty 40

## 2015-04-05 MED ORDER — SODIUM CHLORIDE 0.9 % IV SOLN
INTRAVENOUS | Status: DC
  Filled 2015-04-05: qty 40

## 2015-04-05 MED ORDER — SODIUM CHLORIDE 0.9 % IV SOLN
1500.0000 mg | INTRAVENOUS | Status: DC
  Filled 2015-04-05: qty 1500

## 2015-04-05 MED ORDER — CHLORHEXIDINE GLUCONATE 4 % EX LIQD
60.0000 mL | Freq: Once | CUTANEOUS | Status: AC
  Administered 2015-04-06: 4 via TOPICAL
  Filled 2015-04-05: qty 60

## 2015-04-05 MED ORDER — HEPARIN SODIUM (PORCINE) 1000 UNIT/ML IJ SOLN
INTRAMUSCULAR | Status: DC
  Filled 2015-04-05: qty 30

## 2015-04-05 MED ORDER — CHLORHEXIDINE GLUCONATE 4 % EX LIQD
60.0000 mL | Freq: Once | CUTANEOUS | Status: AC
  Administered 2015-04-05: 4 via TOPICAL
  Filled 2015-04-05: qty 60

## 2015-04-05 MED ORDER — PHENYLEPHRINE HCL 10 MG/ML IJ SOLN
30.0000 ug/min | INTRAVENOUS | Status: DC
  Filled 2015-04-05: qty 2

## 2015-04-05 MED ORDER — DEXMEDETOMIDINE HCL IN NACL 400 MCG/100ML IV SOLN
0.1000 ug/kg/h | INTRAVENOUS | Status: DC
  Filled 2015-04-05: qty 100

## 2015-04-05 MED ORDER — MAGNESIUM SULFATE 50 % IJ SOLN
40.0000 meq | INTRAMUSCULAR | Status: DC
  Filled 2015-04-05: qty 10

## 2015-04-05 MED ORDER — TEMAZEPAM 15 MG PO CAPS: 15.0000 mg | ORAL_CAPSULE | Freq: Once | ORAL | Status: DC | PRN

## 2015-04-05 MED ORDER — DEXTROSE 5 % IV SOLN
1.5000 g | INTRAVENOUS | Status: DC
  Filled 2015-04-05: qty 1.5

## 2015-04-05 NOTE — Progress Notes (Signed)
[address]       [place] [address]             [phone]                    Brae Younan Pratt Medical Record [id number] Date of Birth: Nov 26, 1945  Referring:dR [email or web address]  Primary Care: Leonard Downing, MD  Chief Complaint:    CORONARY ARTERY DISEASE    History of Present Illness:    Ryan Ward 70 y.o. male is seen in the office for Consideration of coronary artery bypass graft. The patient's last heart catheterization was at the time of acute angioplasty of an occluded right coronary artery and the patient was in cardiogenic shock after an inferior myocardial infarction. He was Admitted 12/07/2014 with inferior STEMI,  complicated by complete heart block and cardiogenic shock. He was managed with vasopressors and temporary pacemaker and  IABP . LHC demonstrated severe 3 vessel CAD with an occluded RCA, severe proximal mid LAD disease and moderate to severe ostial LCx disease (small vessel). He underwent PCI with a DES to the RCA. Hospitalization was complicated by VF arrest post PCI related to pacemaker spike on T-wave. EF was 35-45% by cardiac catheterization. Last echocardiogram was December 6. He developed volume excess and required IV Lasix. Hospitalization was further complicated by R posterior tibial and peroneal vein DVT and PE to the right upper lobe. He was placed on heparin and transitioned to Xarelto; Brilinta was stopped and he was placed on Plavix. He developed atrial fibrillation and converted to NSR on IV amiodarone. He had recurrent atrial fibrillation and diltiazem was discontinued. He was placed on amiodarone.   Repeat cardiac catheterization was done several days ago,  The  patient has progressed well since MI. He goes to Cardiac Rehab 3 times a week.   No chest pain or SOB. Taking lasix daily with some 3+ pitting edema bilaterally. He has had no classic anginal symptoms since discharge home.     Current Activity/  Functional Status:  Patient is independent with mobility/ambulation, transfers, ADL's, IADL's.   Zubrod Score: At the time of surgery this patient's most appropriate activity status/level should be described as: []     0    Normal activity, no symptoms [x]     1    Restricted in physical strenuous activity but ambulatory, able to do out light work []     2    Ambulatory and capable of self care, unable to do work activities, up and about               >50 % of waking hours                              []     3    Only limited self care, in bed greater than 50% of waking hours []     4    Completely disabled, no self care, confined to bed or chair []     5    Moribund   Past Medical History  Diagnosis Date  . OSA (obstructive sleep apnea)     not using CPAP at home, does have machine  . Hypertension   . Hyperlipidemia   . STEMI (ST elevation myocardial infarction) ([place]) 12/07/2014    100% stenosis of RCA w/ PCI using DES. 85-95% stenosis in the LAD and will need future staged PCI of the lesion.   [name]  Paroxysmal atrial fibrillation ([place]) 12/12/2014  . Pulmonary embolism ([place]) 12/12/2014  . Coronary artery disease   . Asthma     VERY MILD    Past Surgical History  Procedure Laterality Date  . Hernia repair    . Cardiac catheterization N/A 12/07/2014    Procedure: Coronary Stent Intervention;  Surgeon: Peter M Martinique, MD;  Location: [facility];  Service: Cardiovascular;  Laterality: N/A;  STEMI  . Cardiac catheterization N/A 12/07/2014    Procedure: IABP Insertion;  Surgeon: Peter M Martinique, MD;  Location: [facility];  Service: Cardiovascular;  Laterality: N/A;  . Cardiac catheterization N/A 12/07/2014    Procedure: Temporary Pacemaker;  Surgeon: Peter M Martinique, MD;  Location: [facility];  Service: Cardiovascular;  Laterality: N/A;  . Cardiac catheterization N/A 12/07/2014    Procedure: Left Heart Cath and Coronary Angiography;  Surgeon: Peter M Martinique, MD;  Location: [facility];  Service: Cardiovascular;  Laterality: N/A;  . Cardiac catheterization N/A 03/29/2015    Procedure: Right/Left Heart Cath and Coronary Angiography;  Surgeon: Peter M Martinique, MD;  Location: [facility];  Service: Cardiovascular;  Laterality: N/A;  . Tonsillectomy      Family History  Problem Relation Age of Onset  . Heart attack Father     Social History   Social History  . Marital Status: Significant Other    Spouse Name: N/A  . Number of Children: N/A  . Years of Education: N/A   Occupational History  . Not on file.   Social History Main Topics  . Smoking status: Never Smoker   . Smokeless tobacco: Never Used  . Alcohol Use: No  . Drug Use: No  . Sexual Activity: Not on file   Other Topics Concern  . Not on file   Social History Narrative   Works with [employer]    History  Smoking status  . Never Smoker   Smokeless tobacco  . Never Used    History  Alcohol Use No     No Known Allergies  Current Facility-Administered Medications  Medication Dose Route Frequency Provider Last Rate Last Dose  . 0.9 %  sodium chloride infusion  250 mL Intravenous PRN Evelene Croon Barrett, PA-C 10 mL/hr at 04/04/15 1049 250 mL at 04/04/15 1049  . acetaminophen (TYLENOL) tablet 650 mg  650 mg Oral Q4H PRN Rhonda G Barrett, PA-C      . ALPRAZolam Duanne Moron) tablet 0.25 mg  0.25 mg Oral BID PRN Evelene Croon Barrett, PA-C      . aspirin EC tablet 81 mg  81 mg Oral Daily Evelene Croon Barrett, PA-C   81 mg at 04/05/15 0959  . atorvastatin (LIPITOR) tablet 80 mg  80 mg Oral q1800 Peter M Martinique, MD   80 mg at 04/04/15 1740  . cholecalciferol (VITAMIN D) tablet 5,000 Units  5,000 Units Oral Daily Lonn Georgia, PA-C   5,000 Units at 04/05/15 [id number]  . furosemide (LASIX) tablet 40 mg  40 mg Oral Daily Peter M Martinique, MD   40 mg at 04/05/15 0959  . heparin ADULT infusion 100 units/mL (25000 units/250 mL)  1,000 Units/hr Intravenous Continuous Romona Curls, RPH 10 mL/hr at  04/05/15 1409 1,000 Units/hr at 04/05/15 1409  . metoprolol tartrate (LOPRESSOR) tablet 12.5 mg  12.5 mg Oral BID Evelene Croon Barrett, PA-C   12.5 mg at 04/03/15 2249  . nitroGLYCERIN (NITROSTAT) SL tablet 0.4 mg  0.4 mg  Sublingual Q5 Min x 3 PRN Rhonda G Barrett, PA-C      . ondansetron (ZOFRAN) injection 4 mg  4 mg Intravenous Q6H PRN Rhonda G Barrett, PA-C      . potassium chloride (K-DUR) CR tablet 10 mEq  10 mEq Oral Daily Evelene Croon Barrett, PA-C   10 mEq at 04/05/15 0959  . psyllium (HYDROCIL/METAMUCIL) packet 1 packet  1 packet Oral Daily Erlene Quan, [place]   1 packet at 04/05/15 [phone]  . sodium chloride flush (NS) 0.9 % injection 3 mL  3 mL Intravenous Q12H Rhonda G Barrett, PA-C   3 mL at 04/03/15 2249  . sodium chloride flush (NS) 0.9 % injection 3 mL  3 mL Intravenous PRN Rhonda G Barrett, PA-C      . zolpidem (AMBIEN) tablet 5 mg  5 mg Oral QHS PRN Lonn Georgia, PA-C          Review of Systems:     Cardiac Review of Systems: Y or N  Chest Pain [ at admission not now    ]  Resting SOB [ n  ] Exertional SOB  [ y ]  Orthopnea [ n ]   Pedal Edema [ severe  ]    Palpitations [ y ] Syncope  [ n ]   Presyncope [ y  ]  General Review of Systems: [Y] = yes [  ]=no Constitional: recent weight change [n];  Wt loss over the last 3 months [   ] anorexia [  ]; fatigue [  ]; nausea [  ]; night sweats [  ]; fever [  ]; or chills [  ];          Dental: poor dentition[ n ]; Last Dentist visit:   Eye : blurred vision [  ]; diplopia [   ]; vision changes [  ];  Amaurosis fugax[  ]; Resp: cough [ n ];  wheezing[ n ];  hemoptysis[ n ]; shortness of breath[ n ]; paroxysmal nocturnal dyspnea[  ]; dyspnea on exertion[y  ]; or orthopnea[  ];  GI:  gallstones[  ], vomiting[  ];  dysphagia[  ]; melena[  ];  hematochezia [  ]; heartburn[  ];   Hx of  Colonoscopy[  ]; GU: kidney stones [  ]; hematuria[  ];   dysuria [  ];  nocturia[  ];  history of     obstruction [  ]; urinary frequency [ n ]              Skin: rash, swelling[  ];, hair loss[  ];  peripheral edema[  ];  or itching[  ]; Musculosketetal: myalgias[  ];  joint swelling[  ];  joint erythema[  ];  joint pain[  ];  back pain[  ];  Heme/Lymph: bruising[  ];  bleeding[  ];  anemia[  ];  Neuro: TIA[  n ];  headaches[  ];  stroke[  ];  vertigo[  ];  seizures[  ];   paresthesias[  ];  difficulty walking[ n ];  Psych:depression[  ]; anxiety[  ];  Endocrine: diabetes[ n ];  thyroid dysfunction[ n ];  Immunizations: Flu up to date [  ]; Pneumococcal up to date [  ];  Other:  Physical Exam: BP 93/53 mmHg  Pulse 57  Temp(Src) 97.4 F (36.3 C) (Oral)  Resp 20  Ht 5\' 7"  (1.702 m)  Wt 185 lb 9.6 oz (84.188 kg)  BMI 29.06 kg/m2  SpO2  98%  PHYSICAL EXAMINATION: General appearance: alert, cooperative, appears stated age and no distress Head: Normocephalic, without obvious abnormality, atraumatic Neck: no adenopathy, no carotid bruit, no JVD, supple, symmetrical, trachea midline and thyroid not enlarged, symmetric, no tenderness/mass/nodules Lymph nodes: Cervical, supraclavicular, and axillary nodes normal. Resp: clear to auscultation bilaterally Back: symmetric, no curvature. ROM normal. No CVA tenderness. Cardio: regular rate and rhythm, S1, S2 normal, no murmur, click, rub or gallop GI: soft, non-tender; bowel sounds normal; no masses,  no organomegaly Extremities: edema 2+ bilaterally right leg greater than left, Homans sign is negative, no sign of DVT and no ulcers, gangrene or trophic changes Neurologic: Grossly normal  Diagnostic Studies & Laboratory data:     Recent Radiology Findings:   No results found.     Recent Lab Findings: Lab Results  Component Value Date   WBC 7.3 04/05/2015   HGB 12.4* 04/05/2015   HCT 38.6* 04/05/2015   PLT 225 04/05/2015   GLUCOSE 131* 04/05/2015   CHOL 105* 02/17/2015   TRIG 60 02/17/2015   HDL 32* 02/17/2015   LDLCALC 61 02/17/2015   ALT 32 04/05/2015   AST 31 04/05/2015   NA 143  04/05/2015   K 3.9 04/05/2015   CL 107 04/05/2015   CREATININE 1.06 04/05/2015   BUN 15 04/05/2015   CO2 28 04/05/2015   TSH 4.55* 02/17/2015   INR 1.23 04/01/2015   HGBA1C 5.9* 12/07/2014   CATH: repeat cath: Right/Left Heart Cath and Coronary Angiography    Conclusion     Prox LAD to Mid LAD lesion, 85% stenosed.  Mid LAD to Dist LAD lesion, 95% stenosed.  Ost Cx lesion, 80% stenosed.  Prox RCA to Mid RCA lesion, 30% stenosed.  Mid RCA to Dist RCA lesion, 40% stenosed.  RPDA lesion, 75% stenosed.  There is moderate to severe left ventricular systolic dysfunction.  1. Severe 3 vessel obstructive CAD. Stent in the distal RCA is widely patent.  2. Moderate to severe LV dysfunction with EF 30-35% 3. Mild to moderate pulmonary HTN with elevated LV filling pressures. Prominent V wave on PCWP tracing due to decreased LV compliance.  4. Normal cardiac output.  Plan: Revascularization with CABG.         Procedures    Coronary Stent Intervention   IABP Insertion   Left Heart Cath and Coronary Angiography   Temporary Pacemaker    Conclusion     Prox LAD to Mid LAD lesion, 85% stenosed.  Mid LAD to Dist LAD lesion, 95% stenosed.  Ost Cx lesion, 80% stenosed.  Prox RCA to Mid RCA lesion, 30% stenosed.  Mid RCA to Dist RCA lesion, 40% stenosed.  There is moderate left ventricular systolic dysfunction.  Dist RCA lesion, 100% stenosed. Post intervention, there is a 0% residual stenosis. The lesion was previously treated with a stent (unknown type).  1. Severe 3 vessel obstructive CAD  - 100% occlusion of large RCA distally with very heavy thrombus burden.  _ severe proximal and mid LAD disease  -- Moderate to severe ostial LCx disease- vessel is very small. 2. Moderate LV dysfunction with inferior wall motion abnormality. 3. Successful PCI of the distal RCA with DES and balloon angioplasty for thrombus. 4. Cardiogenic shock- IABP placed and  patient started on pressors. 5. Complete heart block. Temporary pacemaker placed.   Plan: transfer to ICU for continued support. Continue IV Aggrastat for 18 hours. When IV Angiomax runs out will transition to IV heparin. Continue DAPT with ASA and  Brilinta. When patient recovers from acute infarct will need to consider PCI of the LAD.       Assessment / Plan:   Patient status post inferior myocardial infarction presenting with cardiogenic shock due to a totally occluded right coronary artery treated with drug-coated stent in the right coronary artery. Patient had a right dominant circulation. On review of the films from December he has residual disease involving the proximal mid LAD and the circumflex is likely not possible. Anatomically the patient's LAD disease would make angioplasty difficult. I discussed with him coronary artery bypass grafting in this situation.  On review of the patient's follow-up CT scan he has diffuse nonocclusive disease throughout the right coronary artery with some lucency just distal to the stent in the distal right coronary artery the artery has the appearance of unstable plaque in addition he has a very high-grade proximal 95% LAD lesion and poor LV function. And moderate pulmonary hypertension. I discussed with the patient proceeding with coronary artery bypass grafting to the LAD and to the PDA PL branches. There is no bypassable vessels in the circumflex distribution. Because of the patient's recent drug eluding stent placed in the right coronary artery and is very high-grade LAD lesion recommended to him that we proceed with surgery ON  April 5 . He has diuresed 4 liters since admission.  The goals risks and alternatives of the planned surgical procedure  CABG  have been discussed with the patient in detail. The risks of the procedure including death, infection, stroke, myocardial infarction, bleeding, blood transfusion have all been discussed specifically.  I have  quoted Sima Matas a 4 % of perioperative mortality and a complication rate as high as 40%. The patient's questions have been answered.Ludovic Wittman is willing  to proceed with the planned procedure.   In addition to other potential risks and complications from the surgery, I have made the patient aware of the recent Jette concerning the risk of infection by Myocobacterium chimaera related to the use of Stockert 3T heater-cooler equipment during cardiac surgery. I discussed with the patient the low risk of infection, as well as our compliance with the most current FDA recommendations to minimize infection and testing of all devices for contamination. The patient has been made aware of the limited alternatives to immediately replacing the current equipment. The patient has been informed regarding the risks associated with waiting to proceed with needed surgery and that such risks are greater than the risk of infection related to the use of the heater-cooler device. I did make the patient aware that after careful review of the patients having cardiac surgery at [facility] we have no evidence that heater/cooler related infections have occurred at [facility]. We discussed that this is a slow-growing bacterium, such that it can take some period of time for symptoms to develop.  [name] MD      [address] [place] [address] Office [phone]   Beeper [phone]  04/05/2015 2:51 PM

## 2015-04-05 NOTE — Progress Notes (Signed)
1030 Pt observed walking independently. We will follow up after surgery. Graylon Good RN BSN 04/05/2015 10:35 AM

## 2015-04-05 NOTE — Progress Notes (Signed)
TELEMETRY: Reviewed telemetry pt in NSR: Filed Vitals:   04/04/15 1000 04/04/15 1359 04/04/15 2122 04/05/15 0237  BP:  98/46 96/54 101/59  Pulse: 58 58 65 57  Temp:  97.7 F (36.5 C) 98.3 F (36.8 C) 98.3 F (36.8 C)  TempSrc:  Oral Oral Oral  Resp:  20 20 20   Height:      Weight:    84.188 kg (185 lb 9.6 oz)  SpO2:  95% 99% 98%    Intake/Output Summary (Last 24 hours) at 04/05/15 1109 Last data filed at 04/04/15 1730  Gross per 24 hour  Intake    480 ml  Output    726 ml  Net   -246 ml   Filed Weights   04/03/15 0452 04/04/15 0535 04/05/15 0237  Weight: 83.144 kg (183 lb 4.8 oz) 82.872 kg (182 lb 11.2 oz) 84.188 kg (185 lb 9.6 oz)    Subjective Feels well. No chest pain or dyspnea, LE edema has resolved.   [name] aspirin EC  81 mg Oral Daily  . atorvastatin  80 mg Oral q1800  . cholecalciferol  5,000 Units Oral Daily  . furosemide  40 mg Oral Daily  . metoprolol tartrate  12.5 mg Oral BID  . potassium chloride  10 mEq Oral Daily  . psyllium  1 packet Oral Daily  . sodium chloride flush  3 mL Intravenous Q12H   . heparin 1,000 Units/hr (04/03/15 1239)    LABS: Basic Metabolic Panel:  Recent Labs  04/04/15 0425 04/05/15 0222  NA 142 143  K 3.8 3.9  CL 107 107  CO2 27 28  GLUCOSE 97 131*  BUN 13 15  CREATININE 1.07 1.06  CALCIUM 8.9 8.9   Liver Function Tests:  Recent Labs  04/05/15 0222  AST 31  ALT 32  ALKPHOS 57  BILITOT 0.5  PROT 4.9*  ALBUMIN 2.8*   No results for input(s): LIPASE, AMYLASE in the last 72 hours. CBC:  Recent Labs  04/04/15 0425 04/05/15 0222  WBC 7.7 7.3  HGB 13.3 12.4*  HCT 40.9 38.6*  MCV 85.9 86.5  PLT 221 225   Cardiac Enzymes: No results for input(s): CKTOTAL, CKMB, CKMBINDEX, TROPONINI in the last 72 hours. BNP: No results for input(s): PROBNP in the last 72 hours. D-Dimer: No results for input(s): DDIMER in the last 72 hours. Hemoglobin A1C: No results for input(s): HGBA1C in the last 72  hours. Fasting Lipid Panel: No results for input(s): CHOL, HDL, LDLCALC, TRIG, CHOLHDL, LDLDIRECT in the last 72 hours. Thyroid Function Tests: No results for input(s): TSH, T4TOTAL, T3FREE, THYROIDAB in the last 72 hours.  Invalid input(s): FREET3   Radiology/Studies:  No results found.  PHYSICAL EXAM General: Well developed, well nourished, in no acute distress. Head: Normocephalic, atraumatic, sclera non-icteric, oropharynx is clear Neck: Negative for carotid bruits. JVD not elevated. No adenopathy Lungs: Clear bilaterally to auscultation without wheezes, rales, or rhonchi. Breathing is unlabored. Heart: RRR S1 S2 without murmurs, rubs, or gallops.  Abdomen: Soft, non-tender, non-distended with normoactive bowel sounds. No hepatomegaly. No rebound/guarding. No obvious abdominal masses. Msk:  Strength and tone appears normal for age. Extremities: No clubbing, cyanosis or edema.  Distal pedal pulses are 2+ and equal bilaterally. Neuro: Alert and oriented X 3. Moves all extremities spontaneously. Psych:  Responds to questions appropriately with a normal affect.  ASSESSMENT AND PLAN: 1. CAD s/p inferior MI in Dec 2016 with DES to distal RCA. Large thrombus burden. Residual severe LAD disease.  Plan CABG on tomorrow  after Plavix washout. On IV heparin to reduce risk of stent thrombosis. Continue ASA 2. Chronic systolic CHF. EF 30-35%. Good response to IV diuresis with negative > 4 liters since admission. Edema resolved. On oral lasix now. 3. Hyperlipidemia on statin. 4. Multiple complications at time of inferior MI including AFib, V fib, PE, CHB. All resolved.   Present on Admission:  . Paroxysmal atrial fibrillation ([place]) . HTN (hypertension) . Chronic combined systolic and diastolic CHF (congestive heart failure) ([place]) . CAD (coronary artery disease)  Signed, Ryan Ward, [place] 04/05/2015 11:09 AM

## 2015-04-05 NOTE — Progress Notes (Signed)
ANTICOAGULATION CONSULT NOTE  Pharmacy Consult:  Heparin Indication: chest pain/ACS  No Known Allergies  Patient Measurements: Height: 5\' 7"  (170.2 cm) Weight: 185 lb 9.6 oz (84.188 kg) IBW/kg (Calculated) : 66.1 Heparin Dosing Weight: 83 kg  Vital Signs: Temp: 98.3 F (36.8 C) (04/04 0237) Temp Source: Oral (04/04 0237) BP: 101/59 mmHg (04/04 0237) Pulse Rate: 57 (04/04 0237)  Labs:  Recent Labs  04/03/15 0220 04/04/15 0425 04/05/15 0222  HGB 13.6 13.3 12.4*  HCT 41.5 40.9 38.6*  PLT 215 221 225  HEPARINUNFRC 0.42 0.45 0.43  CREATININE 1.14 1.07 1.06    Estimated Creatinine Clearance: 68.2 mL/min (by C-G formula based on Cr of 1.06).   Medical History: Past Medical History  Diagnosis Date  . OSA (obstructive sleep apnea)     not using CPAP at home, does have machine  . Hypertension   . Hyperlipidemia   . STEMI (ST elevation myocardial infarction) ([place]) 12/07/2014    100% stenosis of RCA w/ PCI using DES. 85-95% stenosis in the LAD and will need future staged PCI of the lesion.   . Paroxysmal atrial fibrillation ([place]) 12/12/2014  . Pulmonary embolism ([place]) 12/12/2014  . Coronary artery disease   . Asthma     VERY MILD    Assessment: Ryan Ward with a significant cardiac history to start IV heparin for ACS. Of note, patient has a history of PE in December of 2016 and was placed on Xarelto (has been on hold) CABG planned for 04/06/15.  -heparin level= 0.43 and at goal, CBC with slow trend down  Goal of Therapy:  Heparin level 0.3-0.7 units/ml Monitor platelets by anticoagulation protocol: Yes  Plan:  Heparin at 1000 units/h Daily HL/CBC CABG on 4/5  Hildred Laser, Pharm D 04/05/2015 10:03 AM

## 2015-04-06 ENCOUNTER — Ambulatory Visit (HOSPITAL_COMMUNITY): Payer: Medicare Other

## 2015-04-06 ENCOUNTER — Encounter (HOSPITAL_COMMUNITY): Payer: 59

## 2015-04-06 ENCOUNTER — Encounter (HOSPITAL_COMMUNITY)
Admission: AD | Disposition: A | Discharge status: Home Health | Payer: Self-pay | Source: Ambulatory Visit | Attending: Cardiothoracic Surgery

## 2015-04-06 ENCOUNTER — Inpatient Hospital Stay (HOSPITAL_COMMUNITY): Payer: 59 | Admitting: Anesthesiology

## 2015-04-06 ENCOUNTER — Encounter (HOSPITAL_COMMUNITY): Payer: Self-pay | Admitting: Anesthesiology

## 2015-04-06 ENCOUNTER — Inpatient Hospital Stay (HOSPITAL_COMMUNITY)
Admission: AD | Admit: 2015-04-06 | Discharge: 2015-04-06 | Disposition: A | Discharge status: Home / Self Care | Payer: 59 | Source: Ambulatory Visit | Attending: Cardiothoracic Surgery | Admitting: Cardiothoracic Surgery

## 2015-04-06 ENCOUNTER — Inpatient Hospital Stay (HOSPITAL_COMMUNITY): Payer: 59

## 2015-04-06 DIAGNOSIS — Z951 Presence of aortocoronary bypass graft: Secondary | ICD-10-CM

## 2015-04-06 DIAGNOSIS — I2510 Atherosclerotic heart disease of native coronary artery without angina pectoris: Secondary | ICD-10-CM

## 2015-04-06 HISTORY — PX: CORONARY ARTERY BYPASS GRAFT: SHX141

## 2015-04-06 HISTORY — PX: TEE WITHOUT CARDIOVERSION: SHX5443

## 2015-04-06 LAB — POCT I-STAT, CHEM 8
BUN: 10 mg/dL (ref 6–20)
BUN: 9 mg/dL (ref 6–20)
BUN: 8 mg/dL (ref 6–20)
BUN: 8 mg/dL (ref 6–20)
BUN: 8 mg/dL (ref 6–20)
BUN: 7 mg/dL (ref 6–20)
CALCIUM ION: 1.12 mmol/L — AB (ref 1.13–1.30)
CALCIUM ION: 1.1 mmol/L — AB (ref 1.13–1.30)
CHLORIDE: 99 mmol/L — AB (ref 101–111)
CHLORIDE: 101 mmol/L (ref 101–111)
CHLORIDE: 101 mmol/L (ref 101–111)
CREATININE: 0.7 mg/dL (ref 0.61–1.24)
Calcium, Ion: 1.24 mmol/L (ref 1.13–1.30)
Calcium, Ion: 1.19 mmol/L (ref 1.13–1.30)
Calcium, Ion: 1.11 mmol/L — ABNORMAL LOW (ref 1.13–1.30)
Calcium, Ion: 1.19 mmol/L (ref 1.13–1.30)
Chloride: 104 mmol/L (ref 101–111)
Chloride: 103 mmol/L (ref 101–111)
Chloride: 103 mmol/L (ref 101–111)
Creatinine, Ser: 0.7 mg/dL (ref 0.61–1.24)
Creatinine, Ser: 0.6 mg/dL — ABNORMAL LOW (ref 0.61–1.24)
Creatinine, Ser: 0.6 mg/dL — ABNORMAL LOW (ref 0.61–1.24)
Creatinine, Ser: 0.6 mg/dL — ABNORMAL LOW (ref 0.61–1.24)
Creatinine, Ser: 0.6 mg/dL — ABNORMAL LOW (ref 0.61–1.24)
GLUCOSE: 125 mg/dL — AB (ref 65–99)
GLUCOSE: 150 mg/dL — AB (ref 65–99)
GLUCOSE: 178 mg/dL — AB (ref 65–99)
Glucose, Bld: 99 mg/dL (ref 65–99)
Glucose, Bld: 115 mg/dL — ABNORMAL HIGH (ref 65–99)
Glucose, Bld: 160 mg/dL — ABNORMAL HIGH (ref 65–99)
HCT: 38 % — ABNORMAL LOW (ref 39.0–52.0)
HCT: 29 % — ABNORMAL LOW (ref 39.0–52.0)
HCT: 31 % — ABNORMAL LOW (ref 39.0–52.0)
HCT: 31 % — ABNORMAL LOW (ref 39.0–52.0)
HCT: 32 % — ABNORMAL LOW (ref 39.0–52.0)
HEMATOCRIT: 34 % — AB (ref 39.0–52.0)
HEMOGLOBIN: 12.9 g/dL — AB (ref 13.0–17.0)
HEMOGLOBIN: 11.6 g/dL — AB (ref 13.0–17.0)
HEMOGLOBIN: 10.5 g/dL — AB (ref 13.0–17.0)
HEMOGLOBIN: 10.9 g/dL — AB (ref 13.0–17.0)
Hemoglobin: 9.9 g/dL — ABNORMAL LOW (ref 13.0–17.0)
Hemoglobin: 10.5 g/dL — ABNORMAL LOW (ref 13.0–17.0)
POTASSIUM: 4.3 mmol/L (ref 3.5–5.1)
POTASSIUM: 3.6 mmol/L (ref 3.5–5.1)
Potassium: 4.2 mmol/L (ref 3.5–5.1)
Potassium: 4.2 mmol/L (ref 3.5–5.1)
Potassium: 4.3 mmol/L (ref 3.5–5.1)
Potassium: 3.1 mmol/L — ABNORMAL LOW (ref 3.5–5.1)
SODIUM: 142 mmol/L (ref 135–145)
SODIUM: 139 mmol/L (ref 135–145)
SODIUM: 137 mmol/L (ref 135–145)
SODIUM: 140 mmol/L (ref 135–145)
Sodium: 137 mmol/L (ref 135–145)
Sodium: 141 mmol/L (ref 135–145)
TCO2: 28 mmol/L (ref 0–100)
TCO2: 29 mmol/L (ref 0–100)
TCO2: 27 mmol/L (ref 0–100)
TCO2: 25 mmol/L (ref 0–100)
TCO2: 24 mmol/L (ref 0–100)
TCO2: 20 mmol/L (ref 0–100)

## 2015-04-06 LAB — HEMOGLOBIN AND HEMATOCRIT, BLOOD
HCT: 29.5 % — ABNORMAL LOW (ref 39.0–52.0)
Hemoglobin: 9.6 g/dL — ABNORMAL LOW (ref 13.0–17.0)

## 2015-04-06 LAB — CBC
HCT: 38.9 % — ABNORMAL LOW (ref 39.0–52.0)
HCT: 32.1 % — ABNORMAL LOW (ref 39.0–52.0)
HEMATOCRIT: 33.9 % — AB (ref 39.0–52.0)
HEMOGLOBIN: 12.8 g/dL — AB (ref 13.0–17.0)
HEMOGLOBIN: 11 g/dL — AB (ref 13.0–17.0)
Hemoglobin: 10.2 g/dL — ABNORMAL LOW (ref 13.0–17.0)
MCH: 28.6 pg (ref 26.0–34.0)
MCH: 28 pg (ref 26.0–34.0)
MCH: 27.3 pg (ref 26.0–34.0)
MCHC: 32.9 g/dL (ref 30.0–36.0)
MCHC: 32.4 g/dL (ref 30.0–36.0)
MCHC: 31.8 g/dL (ref 30.0–36.0)
MCV: 87 fL (ref 78.0–100.0)
MCV: 86.3 fL (ref 78.0–100.0)
MCV: 85.8 fL (ref 78.0–100.0)
PLATELETS: 228 10*3/uL (ref 150–400)
Platelets: 190 10*3/uL (ref 150–400)
Platelets: 200 10*3/uL (ref 150–400)
RBC: 4.47 MIL/uL (ref 4.22–5.81)
RBC: 3.93 MIL/uL — ABNORMAL LOW (ref 4.22–5.81)
RBC: 3.74 MIL/uL — ABNORMAL LOW (ref 4.22–5.81)
RDW: 16.3 % — ABNORMAL HIGH (ref 11.5–15.5)
RDW: 16 % — ABNORMAL HIGH (ref 11.5–15.5)
RDW: 16.1 % — ABNORMAL HIGH (ref 11.5–15.5)
WBC: 7.5 10*3/uL (ref 4.0–10.5)
WBC: 18.2 10*3/uL — AB (ref 4.0–10.5)
WBC: 15.9 10*3/uL — ABNORMAL HIGH (ref 4.0–10.5)

## 2015-04-06 LAB — BASIC METABOLIC PANEL
Anion gap: 8 (ref 5–15)
BUN: 10 mg/dL (ref 6–20)
CO2: 29 mmol/L (ref 22–32)
Calcium: 9 mg/dL (ref 8.9–10.3)
Chloride: 106 mmol/L (ref 101–111)
Creatinine, Ser: 0.98 mg/dL (ref 0.61–1.24)
GFR calc Af Amer: >60 mL/min (ref >60)
GFR calc non Af Amer: >60 mL/min (ref >60)
Glucose, Bld: 127 mg/dL — ABNORMAL HIGH (ref 65–99)
Potassium: 3.8 mmol/L (ref 3.5–5.1)
Sodium: 143 mmol/L (ref 135–145)

## 2015-04-06 LAB — POCT I-STAT 3, ART BLOOD GAS (G3+)
ACID-BASE DEFICIT: 4 mmol/L — AB (ref 0.0–2.0)
Acid-Base Excess: 5 mmol/L — ABNORMAL HIGH (ref 0.0–2.0)
BICARBONATE: 28.8 meq/L — AB (ref 20.0–24.0)
BICARBONATE: 26 meq/L — AB (ref 20.0–24.0)
Bicarbonate: 22.8 mEq/L (ref 20.0–24.0)
O2 SAT: 100 %
O2 Saturation: 100 %
O2 Saturation: 96 %
PCO2 ART: 33.7 mmHg — AB (ref 35.0–45.0)
PCO2 ART: 46.1 mmHg — AB (ref 35.0–45.0)
PH ART: 7.526 — AB (ref 7.350–7.450)
PO2 ART: 334 mmHg — AB (ref 80.0–100.0)
Patient temperature: 33.5
Patient temperature: 37.2
TCO2: 30 mmol/L (ref 0–100)
TCO2: 27 mmol/L (ref 0–100)
TCO2: 24 mmol/L (ref 0–100)
pCO2 arterial: 45.4 mmHg — ABNORMAL HIGH (ref 35.0–45.0)
pH, Arterial: 7.361 (ref 7.350–7.450)
pH, Arterial: 7.303 — ABNORMAL LOW (ref 7.350–7.450)
pO2, Arterial: 336 mmHg — ABNORMAL HIGH (ref 80.0–100.0)
pO2, Arterial: 82 mmHg (ref 80.0–100.0)

## 2015-04-06 LAB — POCT I-STAT 4, (NA,K, GLUC, HGB,HCT)
GLUCOSE: 147 mg/dL — AB (ref 65–99)
HEMATOCRIT: 33 % — AB (ref 39.0–52.0)
HEMOGLOBIN: 11.2 g/dL — AB (ref 13.0–17.0)
POTASSIUM: 3.1 mmol/L — AB (ref 3.5–5.1)
SODIUM: 140 mmol/L (ref 135–145)

## 2015-04-06 LAB — HEPARIN LEVEL (UNFRACTIONATED): HEPARIN UNFRACTIONATED: 0.36 [IU]/mL (ref 0.30–0.70)

## 2015-04-06 LAB — PROTIME-INR
INR: 1.64 — AB (ref 0.00–1.49)
PROTHROMBIN TIME: 19.4 s — AB (ref 11.6–15.2)

## 2015-04-06 LAB — GLUCOSE, CAPILLARY
GLUCOSE-CAPILLARY: 62 mg/dL — AB (ref 65–99)
GLUCOSE-CAPILLARY: 150 mg/dL — AB (ref 65–99)
Glucose-Capillary: 137 mg/dL — ABNORMAL HIGH (ref 65–99)
Glucose-Capillary: 136 mg/dL — ABNORMAL HIGH (ref 65–99)
Glucose-Capillary: 144 mg/dL — ABNORMAL HIGH (ref 65–99)

## 2015-04-06 LAB — MAGNESIUM: Magnesium: 2.4 mg/dL (ref 1.7–2.4)

## 2015-04-06 LAB — POCT I-STAT 3, VENOUS BLOOD GAS (G3P V)
ACID-BASE EXCESS: 1 mmol/L (ref 0.0–2.0)
Bicarbonate: 25.7 mEq/L — ABNORMAL HIGH (ref 20.0–24.0)
O2 Saturation: 82 %
PCO2 VEN: 34.1 mmHg — AB (ref 45.0–50.0)
PH VEN: 7.47 — AB (ref 7.250–7.300)
PO2 VEN: 36 mmHg (ref 31.0–45.0)
Patient temperature: 33.5
TCO2: 27 mmol/L (ref 0–100)

## 2015-04-06 LAB — CREATININE, SERUM
Creatinine, Ser: 1.03 mg/dL (ref 0.61–1.24)
GFR calc Af Amer: >60 mL/min (ref >60)
GFR calc non Af Amer: >60 mL/min (ref >60)

## 2015-04-06 LAB — APTT: APTT: 28 s (ref 24–37)

## 2015-04-06 LAB — MRSA PCR SCREENING: MRSA by PCR: NEGATIVE

## 2015-04-06 LAB — PLATELET COUNT: Platelets: 197 K/uL (ref 150–400)

## 2015-04-06 SURGERY — CORONARY ARTERY BYPASS GRAFTING (CABG)
Anesthesia: General | Site: Chest

## 2015-04-06 MED ORDER — VANCOMYCIN HCL IN DEXTROSE 1-5 GM/200ML-% IV SOLN
1000.0000 mg | Freq: Once | INTRAVENOUS | Status: AC
  Administered 2015-04-06: 1000 mg via INTRAVENOUS
  Filled 2015-04-06: qty 200

## 2015-04-06 MED ORDER — LACTATED RINGERS IV SOLN: INTRAVENOUS | Status: DC

## 2015-04-06 MED ORDER — SODIUM CHLORIDE 0.9 % IV SOLN
INTRAVENOUS | Status: DC
  Filled 2015-04-06: qty 20

## 2015-04-06 MED ORDER — DEXMEDETOMIDINE HCL IN NACL 200 MCG/50ML IV SOLN
INTRAVENOUS | Status: AC
  Filled 2015-04-06: qty 50

## 2015-04-06 MED ORDER — HEPARIN SODIUM (PORCINE) 1000 UNIT/ML IJ SOLN
INTRAMUSCULAR | Status: DC | PRN
  Administered 2015-04-06: 27000 [IU] via INTRAVENOUS

## 2015-04-06 MED ORDER — ASPIRIN EC 325 MG PO TBEC
325.0000 mg | DELAYED_RELEASE_TABLET | Freq: Every day | ORAL | Status: DC
  Administered 2015-04-07 – 2015-04-10 (×4): 325 mg via ORAL
  Filled 2015-04-06 (×4): qty 1

## 2015-04-06 MED ORDER — SODIUM CHLORIDE 0.9% FLUSH
3.0000 mL | Freq: Two times a day (BID) | INTRAVENOUS | Status: DC
  Administered 2015-04-08: 6 mL via INTRAVENOUS
  Administered 2015-04-09 – 2015-04-10 (×2): 3 mL via INTRAVENOUS

## 2015-04-06 MED ORDER — SODIUM CHLORIDE 0.45 % IV SOLN
INTRAVENOUS | Status: DC | PRN
  Administered 2015-04-06: 20 mL/h via INTRAVENOUS

## 2015-04-06 MED ORDER — HEPARIN SODIUM (PORCINE) 1000 UNIT/ML IJ SOLN
INTRAMUSCULAR | Status: AC
  Filled 2015-04-06: qty 1

## 2015-04-06 MED ORDER — METOCLOPRAMIDE HCL 5 MG/ML IJ SOLN
10.0000 mg | Freq: Four times a day (QID) | INTRAMUSCULAR | Status: AC
  Administered 2015-04-06 – 2015-04-07 (×4): 10 mg via INTRAVENOUS
  Filled 2015-04-06 (×3): qty 2

## 2015-04-06 MED ORDER — SODIUM CHLORIDE 0.9 % IV SOLN: 250.0000 mL | INTRAVENOUS | Status: DC

## 2015-04-06 MED ORDER — METOPROLOL TARTRATE 12.5 MG HALF TABLET: 12.5000 mg | ORAL_TABLET | Freq: Two times a day (BID) | ORAL | Status: DC

## 2015-04-06 MED ORDER — VANCOMYCIN HCL 1000 MG IV SOLR
1500.0000 mg | INTRAVENOUS | Status: DC | PRN
  Administered 2015-04-06: 1500 mg via INTRAVENOUS

## 2015-04-06 MED ORDER — MIDAZOLAM HCL 5 MG/5ML IJ SOLN
INTRAMUSCULAR | Status: DC | PRN
  Administered 2015-04-06: 2 mg via INTRAVENOUS
  Administered 2015-04-06 (×2): 1 mg via INTRAVENOUS
  Administered 2015-04-06: 2 mg via INTRAVENOUS
  Administered 2015-04-06: 1 mg via INTRAVENOUS
  Administered 2015-04-06: 2 mg via INTRAVENOUS
  Administered 2015-04-06: 5 mg via INTRAVENOUS
  Administered 2015-04-06: 2 mg via INTRAVENOUS
  Administered 2015-04-06 (×2): 1 mg via INTRAVENOUS

## 2015-04-06 MED ORDER — ARTIFICIAL TEARS OP OINT
TOPICAL_OINTMENT | OPHTHALMIC | Status: AC
  Filled 2015-04-06: qty 3.5

## 2015-04-06 MED ORDER — HEMOSTATIC AGENTS (NO CHARGE) OPTIME
TOPICAL | Status: DC | PRN
  Administered 2015-04-06 (×3): 1 via TOPICAL

## 2015-04-06 MED ORDER — INSULIN REGULAR HUMAN 100 UNIT/ML IJ SOLN
250.0000 [IU] | INTRAMUSCULAR | Status: DC | PRN
  Administered 2015-04-06: .8 [IU]/h via INTRAVENOUS

## 2015-04-06 MED ORDER — MIDAZOLAM HCL 10 MG/2ML IJ SOLN
INTRAMUSCULAR | Status: AC
  Filled 2015-04-06: qty 2

## 2015-04-06 MED ORDER — ROCURONIUM BROMIDE 100 MG/10ML IV SOLN
INTRAVENOUS | Status: DC | PRN
  Administered 2015-04-06 (×2): 50 mg via INTRAVENOUS
  Administered 2015-04-06: 70 mg via INTRAVENOUS
  Administered 2015-04-06: 50 mg via INTRAVENOUS

## 2015-04-06 MED ORDER — MAGNESIUM SULFATE 4 GM/100ML IV SOLN
4.0000 g | Freq: Once | INTRAVENOUS | Status: AC
  Administered 2015-04-06: 4 g via INTRAVENOUS
  Filled 2015-04-06: qty 100

## 2015-04-06 MED ORDER — ACETAMINOPHEN 500 MG PO TABS
1000.0000 mg | ORAL_TABLET | Freq: Four times a day (QID) | ORAL | Status: DC
  Administered 2015-04-07 – 2015-04-08 (×6): 1000 mg via ORAL
  Filled 2015-04-06 (×5): qty 2

## 2015-04-06 MED ORDER — SODIUM BICARBONATE 8.4 % IV SOLN
100.0000 meq | Freq: Once | INTRAVENOUS | Status: AC
Start: 2015-04-06 — End: 2015-04-06
  Administered 2015-04-06: 100 meq via INTRAVENOUS

## 2015-04-06 MED ORDER — SODIUM CHLORIDE 0.9 % IV SOLN
INTRAVENOUS | Status: DC
  Administered 2015-04-06: 20 mL via INTRAVENOUS

## 2015-04-06 MED ORDER — SODIUM CHLORIDE 0.9 % IJ SOLN
OROMUCOSAL | Status: DC | PRN
  Administered 2015-04-06 (×3): via TOPICAL

## 2015-04-06 MED ORDER — PROPOFOL 10 MG/ML IV BOLUS
INTRAVENOUS | Status: AC
  Filled 2015-04-06: qty 20

## 2015-04-06 MED ORDER — MILRINONE IN DEXTROSE 20 MG/100ML IV SOLN
0.1850 ug/kg/min | INTRAVENOUS | Status: DC
  Administered 2015-04-06: 0.3 ug/kg/min via INTRAVENOUS
  Filled 2015-04-06: qty 200

## 2015-04-06 MED ORDER — 0.9 % SODIUM CHLORIDE (POUR BTL) OPTIME
TOPICAL | Status: DC | PRN
  Administered 2015-04-06: 6000 mL

## 2015-04-06 MED ORDER — ACETAMINOPHEN 160 MG/5ML PO SOLN: 1000.0000 mg | Freq: Four times a day (QID) | ORAL | Status: DC

## 2015-04-06 MED ORDER — MILRINONE IN DEXTROSE 20 MG/100ML IV SOLN
0.1250 ug/kg/min | INTRAVENOUS | Status: AC
  Administered 2015-04-06: 0.375 ug/kg/min via INTRAVENOUS
  Filled 2015-04-06: qty 100

## 2015-04-06 MED ORDER — DEXTROSE 5 % IV SOLN
0.0000 ug/min | INTRAVENOUS | Status: DC
  Administered 2015-04-06 – 2015-04-07 (×4): 100 ug/min via INTRAVENOUS
  Administered 2015-04-07: 50 ug/min via INTRAVENOUS
  Administered 2015-04-07: 100 ug/min via INTRAVENOUS
  Administered 2015-04-07: 70 ug/min via INTRAVENOUS
  Administered 2015-04-07: 40 ug/min via INTRAVENOUS
  Filled 2015-04-06 (×8): qty 2

## 2015-04-06 MED ORDER — INSULIN REGULAR BOLUS VIA INFUSION
0.0000 [IU] | Freq: Three times a day (TID) | INTRAVENOUS | Status: DC
  Filled 2015-04-06: qty 10

## 2015-04-06 MED ORDER — ONDANSETRON HCL 4 MG/2ML IJ SOLN
4.0000 mg | Freq: Four times a day (QID) | INTRAMUSCULAR | Status: DC | PRN
Start: 2015-04-06 — End: 2015-04-11
  Administered 2015-04-07: 4 mg via INTRAVENOUS
  Filled 2015-04-06 (×2): qty 2

## 2015-04-06 MED ORDER — DEXTROSE 5 % IV SOLN
1.5000 g | INTRAVENOUS | Status: DC | PRN
  Administered 2015-04-06 (×2): 1.5 g via INTRAVENOUS

## 2015-04-06 MED ORDER — ALBUMIN HUMAN 5 % IV SOLN
INTRAVENOUS | Status: DC | PRN
  Administered 2015-04-06: 14:00:00 via INTRAVENOUS

## 2015-04-06 MED ORDER — EPINEPHRINE HCL 1 MG/ML IJ SOLN
0.0000 ug/min | INTRAMUSCULAR | Status: DC
  Administered 2015-04-07: 5 ug/min via INTRAVENOUS
  Filled 2015-04-06 (×2): qty 4

## 2015-04-06 MED ORDER — PANTOPRAZOLE SODIUM 40 MG PO TBEC
40.0000 mg | DELAYED_RELEASE_TABLET | Freq: Every day | ORAL | Status: DC
  Administered 2015-04-08 – 2015-04-10 (×3): 40 mg via ORAL
  Filled 2015-04-06 (×3): qty 1

## 2015-04-06 MED ORDER — PROTAMINE SULFATE 10 MG/ML IV SOLN
INTRAVENOUS | Status: DC | PRN
  Administered 2015-04-06: 10 mg via INTRAVENOUS
  Administered 2015-04-06: 50 mg via INTRAVENOUS
  Administered 2015-04-06: 25 mg via INTRAVENOUS
  Administered 2015-04-06: 30 mg via INTRAVENOUS
  Administered 2015-04-06 (×3): 50 mg via INTRAVENOUS
  Administered 2015-04-06: 40 mg via INTRAVENOUS

## 2015-04-06 MED ORDER — POTASSIUM CHLORIDE 10 MEQ/50ML IV SOLN
10.0000 meq | INTRAVENOUS | Status: AC
  Administered 2015-04-06 (×3): 10 meq via INTRAVENOUS

## 2015-04-06 MED ORDER — DOPAMINE-DEXTROSE 3.2-5 MG/ML-% IV SOLN
5.0000 ug/kg/min | INTRAVENOUS | Status: DC
  Administered 2015-04-07: 5 ug/kg/min via INTRAVENOUS
  Filled 2015-04-06: qty 250

## 2015-04-06 MED ORDER — LACTATED RINGERS IV SOLN
INTRAVENOUS | Status: DC | PRN
  Administered 2015-04-06 (×4): via INTRAVENOUS

## 2015-04-06 MED ORDER — ANTISEPTIC ORAL RINSE SOLUTION (CORINZ)
7.0000 mL | Freq: Four times a day (QID) | OROMUCOSAL | Status: DC
  Administered 2015-04-07: 7 mL via OROMUCOSAL

## 2015-04-06 MED ORDER — LACTATED RINGERS IV SOLN: 500.0000 mL | Freq: Once | INTRAVENOUS | Status: DC | PRN

## 2015-04-06 MED ORDER — OXYCODONE HCL 5 MG PO TABS: 5.0000 mg | ORAL_TABLET | ORAL | Status: DC | PRN

## 2015-04-06 MED ORDER — SODIUM CHLORIDE 0.9 % IV SOLN
INTRAVENOUS | Status: DC
  Administered 2015-04-06: 20:00:00 via INTRAVENOUS
  Filled 2015-04-06 (×3): qty 2.5

## 2015-04-06 MED ORDER — DEXTROSE 5 % IV SOLN
10.0000 mg | INTRAVENOUS | Status: DC | PRN
  Administered 2015-04-06: 50 ug/min via INTRAVENOUS

## 2015-04-06 MED ORDER — DOPAMINE-DEXTROSE 3.2-5 MG/ML-% IV SOLN
INTRAVENOUS | Status: DC | PRN
  Administered 2015-04-06: 2 ug/kg/min via INTRAVENOUS

## 2015-04-06 MED ORDER — PLASMA-LYTE 148 IV SOLN
INTRAVENOUS | Status: DC | PRN
  Administered 2015-04-06: 500 mL via INTRAVASCULAR

## 2015-04-06 MED ORDER — AMINOCAPROIC ACID 250 MG/ML IV SOLN
INTRAVENOUS | Status: DC | PRN
Start: 2015-04-06 — End: 2015-04-06
  Administered 2015-04-06: 5 g via INTRAVENOUS

## 2015-04-06 MED ORDER — NITROGLYCERIN IN D5W 200-5 MCG/ML-% IV SOLN: 0.0000 ug/min | INTRAVENOUS | Status: DC

## 2015-04-06 MED ORDER — FENTANYL CITRATE (PF) 250 MCG/5ML IJ SOLN
INTRAMUSCULAR | Status: AC
  Filled 2015-04-06: qty 5

## 2015-04-06 MED ORDER — ACETAMINOPHEN 650 MG RE SUPP
650.0000 mg | Freq: Once | RECTAL | Status: AC
  Administered 2015-04-06: 650 mg via RECTAL

## 2015-04-06 MED ORDER — SODIUM CHLORIDE 0.9 % IV SOLN
10.0000 g | INTRAVENOUS | Status: DC | PRN
  Administered 2015-04-06: 1 g/h via INTRAVENOUS

## 2015-04-06 MED ORDER — LACTATED RINGERS IV SOLN
INTRAVENOUS | Status: DC
  Administered 2015-04-06: 20 mL via INTRAVENOUS

## 2015-04-06 MED ORDER — BISACODYL 5 MG PO TBEC
10.0000 mg | DELAYED_RELEASE_TABLET | Freq: Every day | ORAL | Status: DC
  Administered 2015-04-08 – 2015-04-10 (×2): 10 mg via ORAL
  Filled 2015-04-06 (×3): qty 2

## 2015-04-06 MED ORDER — METOPROLOL TARTRATE 25 MG/10 ML ORAL SUSPENSION: 12.5000 mg | Freq: Two times a day (BID) | ORAL | Status: DC

## 2015-04-06 MED ORDER — CHLORHEXIDINE GLUCONATE 0.12 % MT SOLN
15.0000 mL | OROMUCOSAL | Status: AC
  Administered 2015-04-06: 15 mL via OROMUCOSAL

## 2015-04-06 MED ORDER — SUCCINYLCHOLINE CHLORIDE 20 MG/ML IJ SOLN
INTRAMUSCULAR | Status: AC
  Filled 2015-04-06: qty 1

## 2015-04-06 MED ORDER — CHLORHEXIDINE GLUCONATE 0.12% ORAL RINSE (MEDLINE KIT)
15.0000 mL | Freq: Two times a day (BID) | OROMUCOSAL | Status: DC
  Administered 2015-04-06 – 2015-04-07 (×2): 15 mL via OROMUCOSAL

## 2015-04-06 MED ORDER — ACETAMINOPHEN 160 MG/5ML PO SOLN: 650.0000 mg | Freq: Once | ORAL | Status: AC

## 2015-04-06 MED ORDER — BISACODYL 10 MG RE SUPP: 10.0000 mg | Freq: Every day | RECTAL | Status: DC

## 2015-04-06 MED ORDER — DEXMEDETOMIDINE HCL IN NACL 200 MCG/50ML IV SOLN
0.0000 ug/kg/h | INTRAVENOUS | Status: DC
  Administered 2015-04-06: 0.02 ug/kg/h via INTRAVENOUS
  Filled 2015-04-06 (×2): qty 50

## 2015-04-06 MED ORDER — DEXAMETHASONE SODIUM PHOSPHATE 10 MG/ML IJ SOLN
INTRAMUSCULAR | Status: AC
  Filled 2015-04-06: qty 1

## 2015-04-06 MED ORDER — ALBUMIN HUMAN 5 % IV SOLN
250.0000 mL | INTRAVENOUS | Status: AC | PRN
  Administered 2015-04-06 – 2015-04-07 (×4): 250 mL via INTRAVENOUS
  Filled 2015-04-06 (×2): qty 250

## 2015-04-06 MED ORDER — SODIUM CHLORIDE 0.9% FLUSH: 3.0000 mL | INTRAVENOUS | Status: DC | PRN

## 2015-04-06 MED ORDER — HEMOSTATIC AGENTS (NO CHARGE) OPTIME
TOPICAL | Status: DC | PRN
  Administered 2015-04-06: 1 via TOPICAL

## 2015-04-06 MED ORDER — FENTANYL CITRATE (PF) 100 MCG/2ML IJ SOLN
INTRAMUSCULAR | Status: DC | PRN
  Administered 2015-04-06 (×4): 100 ug via INTRAVENOUS
  Administered 2015-04-06: 150 ug via INTRAVENOUS
  Administered 2015-04-06: 100 ug via INTRAVENOUS
  Administered 2015-04-06: 150 ug via INTRAVENOUS
  Administered 2015-04-06 (×2): 100 ug via INTRAVENOUS
  Administered 2015-04-06: 50 ug via INTRAVENOUS
  Administered 2015-04-06 (×2): 100 ug via INTRAVENOUS

## 2015-04-06 MED ORDER — DEXTROSE 5 % IV SOLN
4000.0000 ug | INTRAVENOUS | Status: DC | PRN
  Administered 2015-04-06: 2.5 ug/min via INTRAVENOUS

## 2015-04-06 MED ORDER — FENTANYL CITRATE (PF) 250 MCG/5ML IJ SOLN
INTRAMUSCULAR | Status: AC
  Filled 2015-04-06: qty 20

## 2015-04-06 MED ORDER — ASPIRIN 81 MG PO CHEW: 324.0000 mg | CHEWABLE_TABLET | Freq: Every day | ORAL | Status: DC

## 2015-04-06 MED ORDER — ARTIFICIAL TEARS OP OINT
TOPICAL_OINTMENT | OPHTHALMIC | Status: DC | PRN
  Administered 2015-04-06: 1 via OPHTHALMIC

## 2015-04-06 MED ORDER — PROPOFOL 10 MG/ML IV BOLUS
INTRAVENOUS | Status: DC | PRN
  Administered 2015-04-06: 10 mg via INTRAVENOUS
  Administered 2015-04-06: 20 mg via INTRAVENOUS

## 2015-04-06 MED ORDER — MORPHINE SULFATE (PF) 2 MG/ML IV SOLN
1.0000 mg | INTRAVENOUS | Status: DC | PRN
  Filled 2015-04-06: qty 1

## 2015-04-06 MED ORDER — MIDAZOLAM HCL 2 MG/2ML IJ SOLN: 2.0000 mg | INTRAMUSCULAR | Status: DC | PRN

## 2015-04-06 MED ORDER — TRAMADOL HCL 50 MG PO TABS: 50.0000 mg | ORAL_TABLET | ORAL | Status: DC | PRN

## 2015-04-06 MED ORDER — FAMOTIDINE IN NACL 20-0.9 MG/50ML-% IV SOLN
20.0000 mg | Freq: Two times a day (BID) | INTRAVENOUS | Status: AC
  Administered 2015-04-06: 20 mg via INTRAVENOUS

## 2015-04-06 MED ORDER — DEXTROSE 5 % IV SOLN
1.5000 g | Freq: Two times a day (BID) | INTRAVENOUS | Status: AC
  Administered 2015-04-07 – 2015-04-08 (×4): 1.5 g via INTRAVENOUS
  Filled 2015-04-06 (×4): qty 1.5

## 2015-04-06 MED ORDER — ROCURONIUM BROMIDE 50 MG/5ML IV SOLN
INTRAVENOUS | Status: AC
  Filled 2015-04-06: qty 2

## 2015-04-06 MED ORDER — DEXMEDETOMIDINE HCL IN NACL 400 MCG/100ML IV SOLN: INTRAVENOUS | Status: DC | PRN

## 2015-04-06 MED ORDER — MORPHINE SULFATE (PF) 2 MG/ML IV SOLN
2.0000 mg | INTRAVENOUS | Status: DC | PRN
  Administered 2015-04-07 (×4): 2 mg via INTRAVENOUS
  Filled 2015-04-06 (×2): qty 1
  Filled 2015-04-06: qty 2

## 2015-04-06 MED ORDER — PHENYLEPHRINE HCL 10 MG/ML IJ SOLN
INTRAMUSCULAR | Status: DC | PRN
  Administered 2015-04-06 (×2): 80 ug via INTRAVENOUS
  Administered 2015-04-06: 40 ug via INTRAVENOUS

## 2015-04-06 MED ORDER — DEXMEDETOMIDINE HCL IN NACL 400 MCG/100ML IV SOLN
INTRAVENOUS | Status: DC | PRN
  Administered 2015-04-06: .2 ug/kg/h via INTRAVENOUS

## 2015-04-06 MED ORDER — DOCUSATE SODIUM 100 MG PO CAPS
200.0000 mg | ORAL_CAPSULE | Freq: Every day | ORAL | Status: DC
  Administered 2015-04-07 – 2015-04-10 (×3): 200 mg via ORAL
  Filled 2015-04-06 (×3): qty 2

## 2015-04-06 MED ORDER — METOPROLOL TARTRATE 1 MG/ML IV SOLN: 2.5000 mg | INTRAVENOUS | Status: DC | PRN

## 2015-04-06 MED FILL — Magnesium Sulfate Inj 50%: INTRAMUSCULAR | Qty: 10 | Status: AC

## 2015-04-06 MED FILL — Potassium Chloride Inj 2 mEq/ML: INTRAVENOUS | Qty: 40 | Status: AC

## 2015-04-06 MED FILL — Heparin Sodium (Porcine) Inj 1000 Unit/ML: INTRAMUSCULAR | Qty: 30 | Status: AC

## 2015-04-06 SURGICAL SUPPLY — 83 items
APPLICATOR COTTON TIP 6IN STRL (MISCELLANEOUS) ×3 IMPLANT
BAG DECANTER FOR FLEXI CONT (MISCELLANEOUS) ×3 IMPLANT
BANDAGE ACE 4X5 VEL STRL LF (GAUZE/BANDAGES/DRESSINGS) ×3 IMPLANT
BANDAGE ELASTIC 4 VELCRO ST LF (GAUZE/BANDAGES/DRESSINGS) ×3 IMPLANT
BANDAGE ELASTIC 6 VELCRO ST LF (GAUZE/BANDAGES/DRESSINGS) ×3 IMPLANT
BLADE STERNUM SYSTEM 6 (BLADE) ×3 IMPLANT
BLADE SURG 11 STRL SS (BLADE) ×3 IMPLANT
BNDG GAUZE ELAST 4 BULKY (GAUZE/BANDAGES/DRESSINGS) ×3 IMPLANT
CANISTER SUCTION 2500CC (MISCELLANEOUS) ×3 IMPLANT
CATH CPB KIT GERHARDT (MISCELLANEOUS) ×3 IMPLANT
CATH THORACIC 28FR (CATHETERS) ×3 IMPLANT
COVER PROBE W GEL 5X96 (DRAPES) ×3 IMPLANT
CRADLE DONUT ADULT HEAD (MISCELLANEOUS) ×3 IMPLANT
DERMABOND ADVANCED (GAUZE/BANDAGES/DRESSINGS) ×1
DERMABOND ADVANCED .7 DNX12 (GAUZE/BANDAGES/DRESSINGS) ×2 IMPLANT
DRAIN CHANNEL 28F RND 3/8 FF (WOUND CARE) ×3 IMPLANT
DRAPE CARDIOVASCULAR INCISE (DRAPES) ×1
DRAPE INCISE IOBAN 66X45 STRL (DRAPES) ×3 IMPLANT
DRAPE SLUSH/WARMER DISC (DRAPES) ×3 IMPLANT
DRAPE SRG 135X102X78XABS (DRAPES) ×2 IMPLANT
DRSG AQUACEL AG ADV 3.5X14 (GAUZE/BANDAGES/DRESSINGS) ×3 IMPLANT
DRSG COVADERM 4X14 (GAUZE/BANDAGES/DRESSINGS) ×3 IMPLANT
ELECT BLADE 4.0 EZ CLEAN MEGAD (MISCELLANEOUS) ×3
ELECT REM PT RETURN 9FT ADLT (ELECTROSURGICAL) ×6
ELECTRODE BLDE 4.0 EZ CLN MEGD (MISCELLANEOUS) ×2 IMPLANT
ELECTRODE REM PT RTRN 9FT ADLT (ELECTROSURGICAL) ×4 IMPLANT
FELT TEFLON 1X6 (MISCELLANEOUS) ×3 IMPLANT
GAUZE SPONGE 4X4 12PLY STRL (GAUZE/BANDAGES/DRESSINGS) ×6 IMPLANT
GLOVE BIO SURGEON STRL SZ 6.5 (GLOVE) ×15 IMPLANT
GLOVE BIOGEL PI IND STRL 6 (GLOVE) ×4 IMPLANT
GLOVE BIOGEL PI IND STRL 6.5 (GLOVE) ×8 IMPLANT
GLOVE BIOGEL PI INDICATOR 6 (GLOVE) ×2
GLOVE BIOGEL PI INDICATOR 6.5 (GLOVE) ×4
GOWN STRL REUS W/ TWL LRG LVL3 (GOWN DISPOSABLE) ×14 IMPLANT
GOWN STRL REUS W/TWL LRG LVL3 (GOWN DISPOSABLE) ×7
HEMOSTAT POWDER SURGIFOAM 1G (HEMOSTASIS) ×9 IMPLANT
HEMOSTAT SURGICEL 2X14 (HEMOSTASIS) ×6 IMPLANT
KIT BASIN OR (CUSTOM PROCEDURE TRAY) ×3 IMPLANT
KIT CATH SUCT 8FR (CATHETERS) ×3 IMPLANT
KIT ROOM TURNOVER OR (KITS) ×3 IMPLANT
KIT SUCTION CATH 14FR (SUCTIONS) ×6 IMPLANT
KIT VASOVIEW 6 PRO VH 2400 (KITS) ×3 IMPLANT
LEAD PACING MYOCARDI (MISCELLANEOUS) ×3 IMPLANT
MARKER GRAFT CORONARY BYPASS (MISCELLANEOUS) ×9 IMPLANT
NS IRRIG 1000ML POUR BTL (IV SOLUTION) ×18 IMPLANT
PACK OPEN HEART (CUSTOM PROCEDURE TRAY) ×3 IMPLANT
PAD ARMBOARD 7.5X6 YLW CONV (MISCELLANEOUS) ×6 IMPLANT
PAD ELECT DEFIB RADIOL ZOLL (MISCELLANEOUS) ×3 IMPLANT
PENCIL BUTTON HOLSTER BLD 10FT (ELECTRODE) ×3 IMPLANT
PUNCH AORTIC ROTATE  4.5MM 8IN (MISCELLANEOUS) ×3 IMPLANT
SET CARDIOPLEGIA MPS 5001102 (MISCELLANEOUS) ×3 IMPLANT
SOLUTION ANTI FOG 6CC (MISCELLANEOUS) ×3 IMPLANT
SPONGE GAUZE 4X4 12PLY STER LF (GAUZE/BANDAGES/DRESSINGS) ×6 IMPLANT
SPONGE LAP 18X18 X RAY DECT (DISPOSABLE) ×12 IMPLANT
STOPCOCK 4 WAY LG BORE MALE ST (IV SETS) ×3 IMPLANT
SURGIFLO W/THROMBIN 8M KIT (HEMOSTASIS) ×3 IMPLANT
SUT BONE WAX W31G (SUTURE) ×3 IMPLANT
SUT MNCRL AB 4-0 PS2 18 (SUTURE) ×3 IMPLANT
SUT PROLENE 3 0 SH1 36 (SUTURE) ×3 IMPLANT
SUT PROLENE 4 0 TF (SUTURE) ×6 IMPLANT
SUT PROLENE 6 0 C 1 30 (SUTURE) ×12 IMPLANT
SUT PROLENE 6 0 CC (SUTURE) ×6 IMPLANT
SUT PROLENE 7 0 BV 1 (SUTURE) ×9 IMPLANT
SUT PROLENE 7 0 BV1 MDA (SUTURE) ×9 IMPLANT
SUT PROLENE 8 0 BV175 6 (SUTURE) ×6 IMPLANT
SUT SILK 2 0 SH CR/8 (SUTURE) ×3 IMPLANT
SUT STEEL 6MS V (SUTURE) ×3 IMPLANT
SUT STEEL SZ 6 DBL 3X14 BALL (SUTURE) ×3 IMPLANT
SUT VIC AB 1 CTX 18 (SUTURE) ×6 IMPLANT
SUT VIC AB 2-0 CT1 27 (SUTURE) ×2
SUT VIC AB 2-0 CT1 TAPERPNT 27 (SUTURE) ×4 IMPLANT
SUTURE E-PAK OPEN HEART (SUTURE) ×3 IMPLANT
SYSTEM SAHARA CHEST DRAIN ATS (WOUND CARE) ×3 IMPLANT
TAPE CLOTH SURG 4X10 WHT LF (GAUZE/BANDAGES/DRESSINGS) ×3 IMPLANT
TOWEL OR 17X24 6PK STRL BLUE (TOWEL DISPOSABLE) ×6 IMPLANT
TOWEL OR 17X26 10 PK STRL BLUE (TOWEL DISPOSABLE) ×6 IMPLANT
TRAY CATH LUMEN 1 20CM STRL (SET/KITS/TRAYS/PACK) ×3 IMPLANT
TRAY FOLEY IC TEMP SENS 16FR (CATHETERS) ×3 IMPLANT
TUBE SUCT INTRACARD DLP 20F (MISCELLANEOUS) ×3 IMPLANT
TUBING ART PRESS 48 MALE/FEM (TUBING) ×6 IMPLANT
TUBING INSUFFLATION (TUBING) ×3 IMPLANT
UNDERPAD 30X30 INCONTINENT (UNDERPADS AND DIAPERS) ×3 IMPLANT
WATER STERILE IRR 1000ML POUR (IV SOLUTION) ×6 IMPLANT

## 2015-04-06 NOTE — Brief Op Note (Signed)
04/01/2015 - 04/06/2015  12:34 PM  PATIENT:  Ryan Ward  70 y.o. male  PRE-OPERATIVE DIAGNOSIS:  CAD  POST-OPERATIVE DIAGNOSIS:  CAD  PROCEDURE:  Procedure(s):  CORONARY ARTERY BYPASS GRAFT x 4 -LIMA to LAD -SVG to DIAGONAL -SEQ SVG to PROXIMAL AND MID PORTION OF PDA  ENDOSCOPIC HARVEST OF GREATER SAPHENOUS VEIN -Right and Left Thigh  PLACEMENT OF RIGHT FEMORAL ARTERIAL LINE  TRANSESOPHAGEAL ECHOCARDIOGRAM (TEE) (N/A)  SURGEON:  Surgeon(s) and Role:    * [name], MD - Primary  PHYSICIAN ASSISTANT: Ellamarie Naeve PA-C  ANESTHESIA:   general  EBL:  Total I/O In: 1000 [I.V.:1000] Out: 200 [Urine:200]  BLOOD ADMINISTERED:CELLSAVER  DRAINS: Left Pleural Chest Tubes, Mediastinal Chest Drains   LOCAL MEDICATIONS USED:  NONE  SPECIMEN:  No Specimen  DISPOSITION OF SPECIMEN:  N/A  COUNTS:  YES  TOURNIQUET:  * No tourniquets in log *  DICTATION: .Dragon Dictation  PLAN OF CARE: Admit to inpatient   PATIENT DISPOSITION:  ICU - intubated and hemodynamically stable.   Delay start of Pharmacological VTE agent (>24hrs) due to surgical blood loss or risk of bleeding: yes

## 2015-04-06 NOTE — Progress Notes (Signed)
Patient ID: Ryan Ward, male   DOB: 1945/10/30, 70 y.o.   MRN: [id number]  SICU Evening Rounds:   Hemodynamically stable, sinus tachy 110 CI = 4 on dop 5, epi 5, milrinone 0.185 and neo 100 mcg  Still asleep on vent   Urine output good  CT output low  CBC    Component Value Date/Time   WBC 18.2* 04/06/2015 1515   RBC 3.93* 04/06/2015 1515   HGB 11.2* 04/06/2015 1522   HCT 33.0* 04/06/2015 1522   PLT 190 04/06/2015 1515   MCV 86.3 04/06/2015 1515   MCH 28.0 04/06/2015 1515   MCHC 32.4 04/06/2015 1515   RDW 16.0* 04/06/2015 1515   LYMPHSABS 1.6 04/01/2015 1622   MONOABS 0.9 04/01/2015 1622   EOSABS 0.2 04/01/2015 1622   BASOSABS 0.1 04/01/2015 1622     BMET    Component Value Date/Time   NA 140 04/06/2015 1522   K 3.1* 04/06/2015 1522   CL 101 04/06/2015 1343   CO2 29 04/06/2015 0238   GLUCOSE 147* 04/06/2015 1522   BUN 8 04/06/2015 1343   CREATININE 0.60* 04/06/2015 1343   CREATININE 1.15 03/24/2015 1422   CALCIUM 9.0 04/06/2015 0238   GFRNONAA >60 04/06/2015 0238   GFRAA >60 04/06/2015 0238     A/P:  Stable postop course. Continue current plans

## 2015-04-06 NOTE — Transfer of Care (Signed)
Immediate Anesthesia Transfer of Care Note  Patient: Ryan Ward  Procedure(s) Performed: Procedure(s): CORONARY ARTERY BYPASS GRAFT TIMES FOUR  USING LEFT INTERNAL MAMMARY ARTERY TO THE LEFT ANTERIOR DESCENDING, BILATERAL GREATER SAPHENOUS ENDOVEIN HARVEST GRAFT TO PROXIMAL AND MID POSTERIOR DESCENDING AND DIAGONAL CORONARY ARTERIES AND PLACEMENT OR RIGHT FEMORAL A-LINE.  (N/A) TRANSESOPHAGEAL ECHOCARDIOGRAM (TEE) (N/A)  Patient Location: SICU  Anesthesia Type:General  Level of Consciousness: sedated and Patient remains intubated per anesthesia plan  Airway & Oxygen Therapy: Patient remains intubated per anesthesia plan and Patient placed on Ventilator (see vital sign flow sheet for setting)  Post-op Assessment: Report given to RN and Post -op Vital signs reviewed and stable  Post vital signs: Reviewed and stable  Last Vitals:  Filed Vitals:   04/06/15 0452 04/06/15 0600  BP: 105/65 99/55  Pulse: 51 52  Temp:    Resp:      Complications: No apparent anesthesia complications

## 2015-04-06 NOTE — Anesthesia Preprocedure Evaluation (Signed)
Anesthesia Evaluation  Patient identified by MRN, date of birth, ID band Patient awake    Reviewed: Allergy & Precautions, NPO status , Patient's Chart, lab work & pertinent test results, reviewed documented beta blocker date and time   History of Anesthesia Complications Negative for: history of anesthetic complications  Airway Mallampati: II  TM Distance: >3 FB Neck ROM: Full    Dental no notable dental hx.    Pulmonary neg shortness of breath, sleep apnea , neg COPD, neg recent URI, neg PE   breath sounds clear to auscultation       Cardiovascular hypertension, Pt. on medications and Pt. on home beta blockers + CAD, + Past MI, + Cardiac Stents and +CHF  + dysrhythmias + pacemaker  Rhythm:Regular     Neuro/Psych negative neurological ROS  negative psych ROS   GI/Hepatic negative GI ROS, Neg liver ROS,   Endo/Other  negative endocrine ROS  Renal/GU negative Renal ROS     Musculoskeletal   Abdominal   Peds  Hematology  (+) anemia ,   Anesthesia Other Findings H/o MI 2016 with RCA stent, complete heart block post mi with pacemaker since resolved and removed. EF 30-35% with residual three vessel disease.   Reproductive/Obstetrics                             Anesthesia Physical Anesthesia Plan  ASA: IV  Anesthesia Plan: General   Post-op Pain Management:    Induction: Intravenous  Airway Management Planned: Oral ETT  Additional Equipment: Arterial line, CVP, PA Cath, TEE and Ultrasound Guidance Line Placement  Intra-op Plan:   Post-operative Plan: Post-operative intubation/ventilation  Informed Consent: I have reviewed the patients History and Physical, chart, labs and discussed the procedure including the risks, benefits and alternatives for the proposed anesthesia with the patient or authorized representative who has indicated his/her understanding and acceptance.   Dental  advisory given  Plan Discussed with: CRNA and Surgeon  Anesthesia Plan Comments:         Anesthesia Quick Evaluation

## 2015-04-06 NOTE — Anesthesia Procedure Notes (Addendum)
Procedure Name: Intubation Date/Time: 04/06/2015 9:06 AM Performed by: Eligha Bridegroom Pre-anesthesia Checklist: Patient identified, Emergency Drugs available, Patient being monitored and Suction available Patient Re-evaluated:Patient Re-evaluated prior to inductionOxygen Delivery Method: Circle system utilized Preoxygenation: Pre-oxygenation with 100% oxygen Intubation Type: IV induction Ventilation: Mask ventilation without difficulty and Oral airway inserted - appropriate to patient size Laryngoscope Size: 2 and Miller Grade View: Grade II Tube type: Oral Number of attempts: 1 Airway Equipment and Method: Stylet Placement Confirmation: positive ETCO2 and breath sounds checked- equal and bilateral Secured at: 24 (lips) cm Tube secured with: Tape Dental Injury: Teeth and Oropharynx as per pre-operative assessment  Comments: By Clinton Venous Catheter Insertion Performed by: anesthesiologist 04/06/2015 8:00 AM Patient location: Pre-op. Preanesthetic checklist: patient identified, IV checked, site marked, risks and benefits discussed, surgical consent, monitors and equipment checked, pre-op evaluation, timeout performed and anesthesia consent Position: Trendelenburg Lidocaine 1% used for infiltration Landmarks identified and Seldinger technique used Catheter size: 8.5 Fr Central line and PA cath was placed.Sheath introducer Swan type and PA catheter depth:thermodilation and 50PA Cath depth:50 Procedure performed using ultrasound guided technique. Attempts: 1 Following insertion, line sutured and dressing applied. Post procedure assessment: blood return through all ports, free fluid flow and no air. Patient tolerated the procedure well with no immediate complications.

## 2015-04-06 NOTE — Anesthesia Postprocedure Evaluation (Signed)
Anesthesia Post Note  Patient: Ryan Ward  Procedure(s) Performed: Procedure(s) (LRB): CORONARY ARTERY BYPASS GRAFT TIMES FOUR  USING LEFT INTERNAL MAMMARY ARTERY TO THE LEFT ANTERIOR DESCENDING, BILATERAL GREATER SAPHENOUS ENDOVEIN HARVEST GRAFT TO PROXIMAL AND MID POSTERIOR DESCENDING AND DIAGONAL CORONARY ARTERIES AND PLACEMENT OR RIGHT FEMORAL A-LINE.  (N/A) TRANSESOPHAGEAL ECHOCARDIOGRAM (TEE) (N/A)  Patient location during evaluation: ICU Anesthesia Type: General Level of consciousness: sedated Pain management: pain level controlled Vital Signs Assessment: post-procedure vital signs reviewed and stable Respiratory status: patient remains intubated per anesthesia plan Cardiovascular status: stable Postop Assessment: no signs of nausea or vomiting Anesthetic complications: no    Last Vitals:  Filed Vitals:   04/06/15 1530 04/06/15 1545  BP:  97/58  Pulse: 100 103  Temp: 35.5 C 35.5 C  Resp: 12 12    Last Pain:  Filed Vitals:   04/06/15 1545  PainSc: 0-No pain                 Anilah Huck

## 2015-04-06 NOTE — Progress Notes (Signed)
  Echocardiogram Echocardiogram Transesophageal has been performed.  Jennette Dubin 04/06/2015, 10:03 AM

## 2015-04-06 NOTE — Progress Notes (Signed)
Spoke with Dr. Cyndia Bent regarding pre-extubation ABG 7.27/43.2/110/.7/19.05/22/95. Awake and pulling good volumes. CI and BP stable on current drips. Order received to give 2 amps bicarb and extubate. Will continue to closely monitor. Richarda Blade RN

## 2015-04-06 NOTE — Progress Notes (Signed)
Utilization review completed.  

## 2015-04-06 NOTE — Procedures (Signed)
Extubation Procedure Note  Patient Details:   Name: Ryan Ward DOB: 12/15/1945 MRN: [id number]   Airway Documentation:     Evaluation  O2 sats: stable throughout Complications: No apparent complications Patient did tolerate procedure well. Bilateral Breath Sounds: Clear   Yes ,Pt. was able to pull -20 cmh20 for NIF with good effort, able to generate FVC of 1.2 L also had (+) cuff leak prior to extubation, I/S started s/p 750cc X2, good effort, RT to monitor pt. since charted Hx. of CPAP @ home , but not using, tolerated procedure very well, RN at bedside.  Winferd Humphrey 04/06/2015, 11:30 PM

## 2015-04-07 ENCOUNTER — Encounter (HOSPITAL_COMMUNITY): Payer: Self-pay | Admitting: Cardiothoracic Surgery

## 2015-04-07 ENCOUNTER — Inpatient Hospital Stay (HOSPITAL_COMMUNITY): Payer: 59

## 2015-04-07 DIAGNOSIS — Z951 Presence of aortocoronary bypass graft: Secondary | ICD-10-CM

## 2015-04-07 LAB — BASIC METABOLIC PANEL
ANION GAP: 8 (ref 5–15)
BUN: 7 mg/dL (ref 6–20)
CHLORIDE: 112 mmol/L — AB (ref 101–111)
CO2: 22 mmol/L (ref 22–32)
CREATININE: 0.93 mg/dL (ref 0.61–1.24)
Calcium: 7 mg/dL — ABNORMAL LOW (ref 8.9–10.3)
GFR calc non Af Amer: >60 mL/min (ref >60)
Glucose, Bld: 102 mg/dL — ABNORMAL HIGH (ref 65–99)
POTASSIUM: 3.5 mmol/L (ref 3.5–5.1)
SODIUM: 142 mmol/L (ref 135–145)

## 2015-04-07 LAB — POCT I-STAT, CHEM 8
BUN: 8 mg/dL (ref 6–20)
BUN: 12 mg/dL (ref 6–20)
CALCIUM ION: 1.13 mmol/L (ref 1.13–1.30)
CHLORIDE: 100 mmol/L — AB (ref 101–111)
CREATININE: 0.8 mg/dL (ref 0.61–1.24)
CREATININE: 0.8 mg/dL (ref 0.61–1.24)
Calcium, Ion: 1.17 mmol/L (ref 1.13–1.30)
Chloride: 99 mmol/L — ABNORMAL LOW (ref 101–111)
GLUCOSE: 144 mg/dL — AB (ref 65–99)
Glucose, Bld: 133 mg/dL — ABNORMAL HIGH (ref 65–99)
HCT: 26 % — ABNORMAL LOW (ref 39.0–52.0)
HEMATOCRIT: 29 % — AB (ref 39.0–52.0)
Hemoglobin: 8.8 g/dL — ABNORMAL LOW (ref 13.0–17.0)
Hemoglobin: 9.9 g/dL — ABNORMAL LOW (ref 13.0–17.0)
POTASSIUM: 5 mmol/L (ref 3.5–5.1)
Potassium: 3.6 mmol/L (ref 3.5–5.1)
SODIUM: 138 mmol/L (ref 135–145)
Sodium: 144 mmol/L (ref 135–145)
TCO2: 25 mmol/L (ref 0–100)
TCO2: 26 mmol/L (ref 0–100)

## 2015-04-07 LAB — GLUCOSE, CAPILLARY
GLUCOSE-CAPILLARY: 102 mg/dL — AB (ref 65–99)
GLUCOSE-CAPILLARY: 110 mg/dL — AB (ref 65–99)
GLUCOSE-CAPILLARY: 123 mg/dL — AB (ref 65–99)
GLUCOSE-CAPILLARY: 151 mg/dL — AB (ref 65–99)
GLUCOSE-CAPILLARY: 151 mg/dL — AB (ref 65–99)
GLUCOSE-CAPILLARY: 94 mg/dL (ref 65–99)
Glucose-Capillary: 177 mg/dL — ABNORMAL HIGH (ref 65–99)
Glucose-Capillary: 164 mg/dL — ABNORMAL HIGH (ref 65–99)
Glucose-Capillary: 165 mg/dL — ABNORMAL HIGH (ref 65–99)
Glucose-Capillary: 148 mg/dL — ABNORMAL HIGH (ref 65–99)
Glucose-Capillary: 131 mg/dL — ABNORMAL HIGH (ref 65–99)
Glucose-Capillary: 126 mg/dL — ABNORMAL HIGH (ref 65–99)
Glucose-Capillary: 103 mg/dL — ABNORMAL HIGH (ref 65–99)
Glucose-Capillary: 87 mg/dL (ref 65–99)
Glucose-Capillary: 104 mg/dL — ABNORMAL HIGH (ref 65–99)
Glucose-Capillary: 93 mg/dL (ref 65–99)
Glucose-Capillary: 96 mg/dL (ref 65–99)
Glucose-Capillary: 94 mg/dL (ref 65–99)
Glucose-Capillary: 121 mg/dL — ABNORMAL HIGH (ref 65–99)

## 2015-04-07 LAB — MAGNESIUM
MAGNESIUM: 1.9 mg/dL (ref 1.7–2.4)
Magnesium: 2.1 mg/dL (ref 1.7–2.4)

## 2015-04-07 LAB — POCT I-STAT 3, ART BLOOD GAS (G3+)
ACID-BASE DEFICIT: 5 mmol/L — AB (ref 0.0–2.0)
ACID-BASE EXCESS: 1 mmol/L (ref 0.0–2.0)
Acid-base deficit: 7 mmol/L — ABNORMAL HIGH (ref 0.0–2.0)
BICARBONATE: 19.5 meq/L — AB (ref 20.0–24.0)
Bicarbonate: 21.2 mEq/L (ref 20.0–24.0)
Bicarbonate: 26.8 mEq/L — ABNORMAL HIGH (ref 20.0–24.0)
O2 SAT: 94 %
O2 SAT: 97 %
O2 Saturation: 97 %
PCO2 ART: 44.4 mmHg (ref 35.0–45.0)
PH ART: 7.265 — AB (ref 7.350–7.450)
PO2 ART: 110 mmHg — AB (ref 80.0–100.0)
PO2 ART: 79 mmHg — AB (ref 80.0–100.0)
Patient temperature: 37.5
Patient temperature: 37.6
TCO2: 21 mmol/L (ref 0–100)
TCO2: 22 mmol/L (ref 0–100)
TCO2: 28 mmol/L (ref 0–100)
pCO2 arterial: 43.2 mmHg (ref 35.0–45.0)
pCO2 arterial: 45.7 mmHg — ABNORMAL HIGH (ref 35.0–45.0)
pH, Arterial: 7.289 — ABNORMAL LOW (ref 7.350–7.450)
pH, Arterial: 7.377 (ref 7.350–7.450)
pO2, Arterial: 98 mmHg (ref 80.0–100.0)

## 2015-04-07 LAB — CBC
HCT: 28 % — ABNORMAL LOW (ref 39.0–52.0)
HEMATOCRIT: 28.1 % — AB (ref 39.0–52.0)
HEMATOCRIT: 29.4 % — AB (ref 39.0–52.0)
HEMOGLOBIN: 9.2 g/dL — AB (ref 13.0–17.0)
Hemoglobin: 9.3 g/dL — ABNORMAL LOW (ref 13.0–17.0)
Hemoglobin: 9.5 g/dL — ABNORMAL LOW (ref 13.0–17.0)
MCH: 27.9 pg (ref 26.0–34.0)
MCH: 28.7 pg (ref 26.0–34.0)
MCH: 27.8 pg (ref 26.0–34.0)
MCHC: 32.7 g/dL (ref 30.0–36.0)
MCHC: 33.2 g/dL (ref 30.0–36.0)
MCHC: 32.3 g/dL (ref 30.0–36.0)
MCV: 85.2 fL (ref 78.0–100.0)
MCV: 86.4 fL (ref 78.0–100.0)
MCV: 86 fL (ref 78.0–100.0)
PLATELETS: 164 10*3/uL (ref 150–400)
Platelets: 191 10*3/uL (ref 150–400)
Platelets: 163 10*3/uL (ref 150–400)
RBC: 3.3 MIL/uL — AB (ref 4.22–5.81)
RBC: 3.24 MIL/uL — ABNORMAL LOW (ref 4.22–5.81)
RBC: 3.42 MIL/uL — AB (ref 4.22–5.81)
RDW: 16.2 % — ABNORMAL HIGH (ref 11.5–15.5)
RDW: 16.5 % — ABNORMAL HIGH (ref 11.5–15.5)
RDW: 16.6 % — AB (ref 11.5–15.5)
WBC: 13.2 10*3/uL — AB (ref 4.0–10.5)
WBC: 15.4 10*3/uL — ABNORMAL HIGH (ref 4.0–10.5)
WBC: 15.9 10*3/uL — AB (ref 4.0–10.5)

## 2015-04-07 LAB — CREATININE, SERUM
Creatinine, Ser: 0.83 mg/dL (ref 0.61–1.24)
Creatinine, Ser: 0.86 mg/dL (ref 0.61–1.24)
GFR calc Af Amer: >60 mL/min (ref >60)
GFR calc non Af Amer: >60 mL/min (ref >60)
GFR calc non Af Amer: >60 mL/min (ref >60)

## 2015-04-07 MED ORDER — CETYLPYRIDINIUM CHLORIDE 0.05 % MT LIQD
7.0000 mL | Freq: Two times a day (BID) | OROMUCOSAL | Status: DC
  Administered 2015-04-09 – 2015-04-12 (×6): 7 mL via OROMUCOSAL

## 2015-04-07 MED ORDER — SODIUM BICARBONATE 8.4 % IV SOLN
50.0000 meq | Freq: Once | INTRAVENOUS | Status: AC
  Administered 2015-04-07: 50 meq via INTRAVENOUS

## 2015-04-07 MED ORDER — POTASSIUM CHLORIDE 10 MEQ/50ML IV SOLN
10.0000 meq | INTRAVENOUS | Status: AC
  Administered 2015-04-07 (×3): 10 meq via INTRAVENOUS

## 2015-04-07 MED ORDER — SODIUM BICARBONATE 8.4 % IV SOLN
INTRAVENOUS | Status: AC
  Filled 2015-04-07: qty 50

## 2015-04-07 MED ORDER — INSULIN DETEMIR 100 UNIT/ML ~~LOC~~ SOLN
10.0000 [IU] | Freq: Every day | SUBCUTANEOUS | Status: DC
  Administered 2015-04-08 – 2015-04-10 (×3): 10 [IU] via SUBCUTANEOUS
  Filled 2015-04-07 (×4): qty 0.1

## 2015-04-07 MED ORDER — POTASSIUM CHLORIDE 10 MEQ/50ML IV SOLN
INTRAVENOUS | Status: AC
Start: 2015-04-07 — End: 2015-04-07
  Administered 2015-04-07: 10 meq via INTRAVENOUS
  Filled 2015-04-07: qty 150

## 2015-04-07 MED ORDER — POTASSIUM CHLORIDE 10 MEQ/50ML IV SOLN
10.0000 meq | INTRAVENOUS | Status: AC
  Administered 2015-04-07 (×3): 10 meq via INTRAVENOUS
  Filled 2015-04-07: qty 50

## 2015-04-07 MED ORDER — METOPROLOL TARTRATE 12.5 MG HALF TABLET
12.5000 mg | ORAL_TABLET | Freq: Two times a day (BID) | ORAL | Status: DC
  Administered 2015-04-08 – 2015-04-10 (×3): 12.5 mg via ORAL
  Filled 2015-04-07 (×3): qty 1

## 2015-04-07 MED ORDER — INSULIN DETEMIR 100 UNIT/ML ~~LOC~~ SOLN
10.0000 [IU] | Freq: Once | SUBCUTANEOUS | Status: AC
  Administered 2015-04-07: 10 [IU] via SUBCUTANEOUS
  Filled 2015-04-07: qty 0.1

## 2015-04-07 MED ORDER — ENOXAPARIN SODIUM 30 MG/0.3ML ~~LOC~~ SOLN
30.0000 mg | Freq: Every day | SUBCUTANEOUS | Status: DC
  Administered 2015-04-07 – 2015-04-08 (×2): 30 mg via SUBCUTANEOUS
  Filled 2015-04-07 (×2): qty 0.3

## 2015-04-07 MED ORDER — INSULIN ASPART 100 UNIT/ML ~~LOC~~ SOLN
0.0000 [IU] | SUBCUTANEOUS | Status: DC
  Administered 2015-04-07 – 2015-04-09 (×7): 2 [IU] via SUBCUTANEOUS

## 2015-04-07 MED ORDER — METOPROLOL TARTRATE 25 MG/10 ML ORAL SUSPENSION: 12.5000 mg | Freq: Two times a day (BID) | ORAL | Status: DC

## 2015-04-07 MED ORDER — INSULIN ASPART 100 UNIT/ML ~~LOC~~ SOLN: 0.0000 [IU] | SUBCUTANEOUS | Status: DC

## 2015-04-07 MED FILL — Sodium Chloride IV Soln 0.9%: INTRAVENOUS | Qty: 2000 | Status: AC

## 2015-04-07 MED FILL — Lidocaine HCl IV Inj 20 MG/ML: INTRAVENOUS | Qty: 5 | Status: AC

## 2015-04-07 MED FILL — Heparin Sodium (Porcine) Inj 1000 Unit/ML: INTRAMUSCULAR | Qty: 10 | Status: AC

## 2015-04-07 MED FILL — Mannitol IV Soln 20%: INTRAVENOUS | Qty: 500 | Status: AC

## 2015-04-07 MED FILL — Heparin Sodium (Porcine) Inj 1000 Unit/ML: INTRAMUSCULAR | Qty: 30 | Status: AC

## 2015-04-07 MED FILL — Sodium Bicarbonate IV Soln 8.4%: INTRAVENOUS | Qty: 50 | Status: AC

## 2015-04-07 MED FILL — Electrolyte-R (PH 7.4) Solution: INTRAVENOUS | Qty: 4000 | Status: AC

## 2015-04-07 NOTE — Progress Notes (Addendum)
RT instructed patient on use of flutter valve. Pt was able to demonstrate back good technique.

## 2015-04-07 NOTE — Progress Notes (Addendum)
TCTS DAILY ICU PROGRESS NOTE                   Ryan Ward            [place] [id number]          [phone]   1 Day Post-Op Procedure(s) (LRB): CORONARY ARTERY BYPASS GRAFT TIMES FOUR  USING LEFT INTERNAL MAMMARY ARTERY TO THE LEFT ANTERIOR DESCENDING, BILATERAL GREATER SAPHENOUS ENDOVEIN HARVEST GRAFT TO PROXIMAL AND MID POSTERIOR DESCENDING AND DIAGONAL CORONARY ARTERIES AND PLACEMENT OR RIGHT FEMORAL A-LINE.  (N/A) TRANSESOPHAGEAL ECHOCARDIOGRAM (TEE) (N/A)  Total Length of Stay:  LOS: 6 days   Subjective: Patient states he does not feel "too bad" this am.  Objective: Vital signs in last 24 hours: Temp:  [95.7 F (35.4 C)-100 F (37.8 C)] 98.4 F (36.9 C) (04/06 0900) Pulse Rate:  [93-130] 93 (04/06 0900) Cardiac Rhythm:  [-] Normal sinus rhythm (04/06 0800) Resp:  [0-28] 0 (04/06 0900) BP: (86-113)/(45-61) 102/60 mmHg (04/06 0900) SpO2:  [94 %-100 %] 99 % (04/06 0900) Arterial Line BP: (85-122)/(45-61) 110/51 mmHg (04/06 0900) FiO2 (%):  [40 %-50 %] 40 % (04/05 2300) Weight:  [195 lb 1.7 oz (88.5 kg)] 195 lb 1.7 oz (88.5 kg) (04/06 0500)  Filed Weights   04/05/15 0237 04/06/15 0508 04/07/15 0500  Weight: 185 lb 9.6 oz (84.188 kg) 185 lb 1.6 oz (83.961 kg) 195 lb 1.7 oz (88.5 kg)    Weight change: 10 lb 0.1 oz (4.539 kg)   Hemodynamic parameters for last 24 hours: PAP: (20-43)/(10-23) 29/11 mmHg CO:  [6.1 L/min-9 L/min] 6.1 L/min CI:  [3.1 L/min/m2-4.6 L/min/m2] 3.1 L/min/m2  Intake/Output from previous day: 04/05 0701 - 04/06 0700 In: 10217.8 [I.V.:7469.8; Blood:518; NG/GT:30; IV Piggyback:2200] Out: 9326 [Urine:2500; Blood:2321; Chest Tube:820]  Intake/Output this shift: Total I/O In: 363.9 [I.V.:313.9; IV Piggyback:50] Out: 75 [Urine:75]  Current Meds: Scheduled Meds: . acetaminophen  1,000 mg Oral 4 times per day   Or  . acetaminophen (TYLENOL) oral liquid 160 mg/5 mL  1,000 mg Per Tube 4 times per day  . antiseptic oral rinse  7  mL Mouth Rinse QID  . aspirin EC  325 mg Oral Daily   Or  . aspirin  324 mg Per Tube Daily  . atorvastatin  80 mg Oral q1800  . bisacodyl  10 mg Oral Daily   Or  . bisacodyl  10 mg Rectal Daily  . cefUROXime (ZINACEF)  IV  1.5 g Intravenous Q12H  . chlorhexidine gluconate (SAGE KIT)  15 mL Mouth Rinse BID  . cholecalciferol  5,000 Units Oral Daily  . docusate sodium  200 mg Oral Daily  . insulin regular  0-10 Units Intravenous TID WC  . metoCLOPramide (REGLAN) injection  10 mg Intravenous 4 times per day  . metoprolol tartrate  12.5 mg Oral BID   Or  . metoprolol tartrate  12.5 mg Per Tube BID  . [START ON 04/08/2015] pantoprazole  40 mg Oral Daily  . sodium chloride flush  3 mL Intravenous Q12H   Continuous Infusions: . sodium chloride 20 mL/hr at 04/07/15 0900  . sodium chloride    . sodium chloride 20 mL/hr at 04/07/15 0900  . dexmedetomidine Stopped (04/06/15 2030)  . DOPamine 5 mcg/kg/min (04/07/15 0900)  . EPINEPHrine 4 mg in dextrose 5% 250 mL infusion (16 mcg/mL) 5.013 mcg/min (04/07/15 0900)  . insulin (NOVOLIN-R) infusion 2.8 Units/hr (04/07/15 0900)  . lactated ringers Stopped (04/06/15 1530)  .  lactated ringers 20 mL/hr at 04/07/15 0900  . milrinone Stopped (04/07/15 0800)  . nitroGLYCERIN Stopped (04/06/15 1530)  . phenylephrine (NEO-SYNEPHRINE) Adult infusion 55 mcg/min (04/07/15 0900)   PRN Meds:.sodium chloride, ALPRAZolam, lactated ringers, metoprolol, midazolam, morphine injection, ondansetron (ZOFRAN) IV, oxyCODONE, sodium chloride flush, traMADol, zolpidem  General appearance: alert, cooperative and no distress Neurologic: intact Heart: RRR, rub present with chest tubes in place Lungs: Diminished at bases Abdomen: soft, non-tender; bowel sounds normal; no masses,  no organomegaly Extremities: Mild LE edema. Dressings clean, dry, intact Wound: Dressing is intact  Lab Results: CBC: Recent Labs  04/06/15 2059  04/07/15 0109 04/07/15 0410  WBC 15.9*   --   --  13.2*  HGB 10.2*  < > 8.8* 9.2*  HCT 32.1*  < > 26.0* 28.1*  PLT 200  --   --  191  < > = values in this interval not displayed. BMET:  Recent Labs  04/06/15 0238  04/07/15 0109 04/07/15 0410  NA 143  < > 144 142  K 3.8  < > 3.6 3.5  CL 106  < > 100* 112*  CO2 29  --   --  22  GLUCOSE 127*  < > 144* 102*  BUN 10  < > 8 7  CREATININE 0.98  < > 0.80 0.93  CALCIUM 9.0  --   --  7.0*  < > = values in this interval not displayed.  PT/INR:  Recent Labs  04/06/15 1515  LABPROT 19.4*  INR 1.64*   Radiology: Dg Chest Port 1 View  04/07/2015  CLINICAL DATA:  CABG. EXAM: PORTABLE CHEST 1 VIEW COMPARISON:  04/06/2015. FINDINGS: Interim extubation and removal of NG tube. Swan-Ganz catheter, mediastinal drainage catheter, left chest tube in stable position. Prior CABG. Cardiomegaly. Low lung volumes with basilar atelectasis and/or infiltrates. Findings have progressed from prior exam . No pleural effusion or pneumothorax. IMPRESSION: 1. Interim extubation and removal of NG tube. Swan-Ganz catheter, mediastinal drainage catheter, left chest tube in stable position. 2. Low lung volumes with mild bibasilar atelectasis and/or infiltrates. Findings have progressed from prior exam . 3. Prior CABG.  Cardiomegaly.  Normal pulmonary vascularity . Electronically Signed   By: Marcello Moores  Register   On: 04/07/2015 07:51   Dg Chest Port 1 View  04/06/2015  CLINICAL DATA:  70 year old male status post CABG. Initial encounter. EXAM: PORTABLE CHEST 1 VIEW COMPARISON:  04/05/2015 and earlier. FINDINGS: Portable AP view at 1534 hours. Intubated. Endotracheal tube tip at the level the clavicles. Enteric tube courses to the left upper quadrant, side hole to level of the gastric fundus. Right IJ approach Swan-Ganz catheter, tip at the level of the distal right pulmonary artery. Mediastinal and left chest tubes in place. No pneumothorax or pulmonary edema. Linear perihilar probable atelectasis greater on the left.  Stable cardiac size and mediastinal contours. Sequelae of CABG. No pleural effusion identified. Epicardial pacer wires also in place. IMPRESSION: 1. Lines and tubes appropriately placed. 2. No pneumothorax.  Mild atelectasis. Electronically Signed   By: Genevie Ann M.D.   On: 04/06/2015 15:53     Assessment/Plan: S/P Procedure(s) (LRB): CORONARY ARTERY BYPASS GRAFT TIMES FOUR  USING LEFT INTERNAL MAMMARY ARTERY TO THE LEFT ANTERIOR DESCENDING, BILATERAL GREATER SAPHENOUS ENDOVEIN HARVEST GRAFT TO PROXIMAL AND MID POSTERIOR DESCENDING AND DIAGONAL CORONARY ARTERIES AND PLACEMENT OR RIGHT FEMORAL A-LINE.  (N/A) TRANSESOPHAGEAL ECHOCARDIOGRAM (TEE) (N/A)  1.CV-Latest CO/CI is 6.1 and 3.1.SR in the 90's. On Dopamine, Epinephrine, Neo synephrine drips. Wean  drips as tolerates. Hold Lopressor for now. 2. Pulmonary-Chest tubes with 820 cc since surgery.On 3 liters of oxygen via [place]. Will wean as able. CXR this am shows low lung volumes, bibasilar atelectasis, cardiomegaly, no pneumothorax. Encourage incentive spirometer and flutter valve. 3. Volume overload-will start diuresis likely in am 4. ABL anemia-H and H this am up to 9.2 and 28.1 5. Supplement potassium-already been done  ZIMMERMAN,DONIELLE M PA-C 04/07/2015 9:59 AM  plt count ok Resume plavix tomorrow with decreased risk of bleeding  milirone off , wean epi as tolerated  I have seen and examined Sima Matas and agree with the above assessment  and plan.  [name] MD [facility] [phone] Office [phone] 04/07/2015 10:41 AM

## 2015-04-07 NOTE — Progress Notes (Signed)
TELEMETRY: Reviewed telemetry pt in NSR, rare PVC: Filed Vitals:   04/07/15 0630 04/07/15 0645 04/07/15 0700 04/07/15 0800  BP: 100/53 97/51 104/61 111/59  Pulse: 97 97 108 95  Temp: 98.8 F (37.1 C) 98.8 F (37.1 C) 98.8 F (37.1 C) 98.6 F (37 C)  TempSrc:   Core (Comment)   Resp: _0 0  Height:      Weight:      SpO2: 100% 100% 99% 99%    Intake/Output Summary (Last 24 hours) at 04/07/15 0904 Last data filed at 04/07/15 0802  Gross per 24 hour  Intake 10440.65 ml  Output   5716 ml  Net 4724.65 ml   Filed Weights   04/05/15 0237 04/06/15 0508 04/07/15 0500  Weight: 84.188 kg (185 lb 9.6 oz) 83.961 kg (185 lb 1.6 oz) 88.5 kg (195 lb 1.7 oz)    Subjective Feels well. No chest pain or dyspnea. Not much post op pain  . acetaminophen  1,000 mg Oral 4 times per day   Or  . acetaminophen (TYLENOL) oral liquid 160 mg/5 mL  1,000 mg Per Tube 4 times per day  . antiseptic oral rinse  7 mL Mouth Rinse QID  . aspirin EC  325 mg Oral Daily   Or  . aspirin  324 mg Per Tube Daily  . atorvastatin  80 mg Oral q1800  . bisacodyl  10 mg Oral Daily   Or  . bisacodyl  10 mg Rectal Daily  . cefUROXime (ZINACEF)  IV  1.5 g Intravenous Q12H  . chlorhexidine gluconate (SAGE KIT)  15 mL Mouth Rinse BID  . cholecalciferol  5,000 Units Oral Daily  . docusate sodium  200 mg Oral Daily  . famotidine (PEPCID) IV  20 mg Intravenous Q12H  . insulin regular  0-10 Units Intravenous TID WC  . metoCLOPramide (REGLAN) injection  10 mg Intravenous 4 times per day  . metoprolol tartrate  12.5 mg Oral BID   Or  . metoprolol tartrate  12.5 mg Per Tube BID  . [START ON 04/08/2015] pantoprazole  40 mg Oral Daily  . sodium chloride flush  3 mL Intravenous Q12H   . sodium chloride 20 mL/hr at 04/07/15 0800  . sodium chloride    . sodium chloride 20 mL/hr at 04/07/15 0800  . dexmedetomidine Stopped (04/06/15 2030)  . DOPamine 5 mcg/kg/min (04/07/15 0800)  . EPINEPHrine 4 mg in dextrose 5% 250  mL infusion (16 mcg/mL) 5.013 mcg/min (04/07/15 0800)  . insulin (NOVOLIN-R) infusion 1.8 Units/hr (04/07/15 0800)  . lactated ringers Stopped (04/06/15 1530)  . lactated ringers 20 mL/hr at 04/07/15 0800  . milrinone Stopped (04/07/15 0800)  . nitroGLYCERIN Stopped (04/06/15 1530)  . phenylephrine (NEO-SYNEPHRINE) Adult infusion 70 mcg/min (04/07/15 0800)    LABS: Basic Metabolic Panel:  Recent Labs  04/06/15 0238  04/06/15 2059  04/07/15 0109 04/07/15 0410  NA 143  < >  --   < > 144 142  K 3.8  < >  --   < > 3.6 3.5  CL 106  < >  --   < > 100* 112*  CO2 29  --   --   --   --  22  GLUCOSE 127*  < >  --   < > 144* 102*  BUN 10  < >  --   < > 8 7  CREATININE 0.98  < > 1.03  < > 0.80 0.93  CALCIUM 9.0  --   --   --   --  7.0*  MG  --   --  2.4  --   --  1.9  < > = values in this interval not displayed. Liver Function Tests:  Recent Labs  04/05/15 0222  AST 31  ALT 32  ALKPHOS 57  BILITOT 0.5  PROT 4.9*  ALBUMIN 2.8*   No results for input(s): LIPASE, AMYLASE in the last 72 hours. CBC:  Recent Labs  04/06/15 2059  04/07/15 0109 04/07/15 0410  WBC 15.9*  --   --  13.2*  HGB 10.2*  < > 8.8* 9.2*  HCT 32.1*  < > 26.0* 28.1*  MCV 85.8  --   --  85.2  PLT 200  --   --  191  < > = values in this interval not displayed. Cardiac Enzymes: No results for input(s): CKTOTAL, CKMB, CKMBINDEX, TROPONINI in the last 72 hours. BNP: No results for input(s): PROBNP in the last 72 hours. D-Dimer: No results for input(s): DDIMER in the last 72 hours. Hemoglobin A1C: No results for input(s): HGBA1C in the last 72 hours. Fasting Lipid Panel: No results for input(s): CHOL, HDL, LDLCALC, TRIG, CHOLHDL, LDLDIRECT in the last 72 hours. Thyroid Function Tests: No results for input(s): TSH, T4TOTAL, T3FREE, THYROIDAB in the last 72 hours.  Invalid input(s): FREET3   Radiology/Studies:  Dg Chest 2 View  04/05/2015  CLINICAL DATA:  Preoperative respiratory evaluation prior to  CABG. Current history of asthma. STEMI 12/07/2014. Pulmonary emboli diagnosed 12/12/2014. EXAM: CHEST  2 VIEW COMPARISON:  CTA chest 12/12/2014.  Two-view chest x-ray 12/09/2014. FINDINGS: Cardiac silhouette moderately enlarged, unchanged. Thoracic aorta minimally atherosclerotic, unchanged. Hilar and mediastinal contours otherwise unremarkable. Lungs clear. Bronchovascular markings normal. Pulmonary vascularity normal. No visible pleural effusions. No pneumothorax. Visualized bony thorax intact. IMPRESSION: Cardiomegaly.  No acute cardiopulmonary disease. Electronically Signed   By: Evangeline Dakin M.D.   On: 04/05/2015 21:06   Dg Chest Port 1 View  04/07/2015  CLINICAL DATA:  CABG. EXAM: PORTABLE CHEST 1 VIEW COMPARISON:  04/06/2015. FINDINGS: Interim extubation and removal of NG tube. Swan-Ganz catheter, mediastinal drainage catheter, left chest tube in stable position. Prior CABG. Cardiomegaly. Low lung volumes with basilar atelectasis and/or infiltrates. Findings have progressed from prior exam . No pleural effusion or pneumothorax. IMPRESSION: 1. Interim extubation and removal of NG tube. Swan-Ganz catheter, mediastinal drainage catheter, left chest tube in stable position. 2. Low lung volumes with mild bibasilar atelectasis and/or infiltrates. Findings have progressed from prior exam . 3. Prior CABG.  Cardiomegaly.  Normal pulmonary vascularity . Electronically Signed   By: Marcello Moores  Register   On: 04/07/2015 07:51   Dg Chest Port 1 View  04/06/2015  CLINICAL DATA:  70 year old male status post CABG. Initial encounter. EXAM: PORTABLE CHEST 1 VIEW COMPARISON:  04/05/2015 and earlier. FINDINGS: Portable AP view at 1534 hours. Intubated. Endotracheal tube tip at the level the clavicles. Enteric tube courses to the left upper quadrant, side hole to level of the gastric fundus. Right IJ approach Swan-Ganz catheter, tip at the level of the distal right pulmonary artery. Mediastinal and left chest tubes in place.  No pneumothorax or pulmonary edema. Linear perihilar probable atelectasis greater on the left. Stable cardiac size and mediastinal contours. Sequelae of CABG. No pleural effusion identified. Epicardial pacer wires also in place. IMPRESSION: 1. Lines and tubes appropriately placed. 2. No pneumothorax.  Mild atelectasis. Electronically Signed   By: Genevie Ann M.D.   On: 04/06/2015 15:53    PHYSICAL EXAM General: Well developed,  well nourished, in no acute distress. Head: Normal. Right IJ in place.  Neck: Negative for carotid bruits. JVD not elevated. No adenopathy Lungs: Clear anteriorly Heart: RRR S1 S2 without murmurs, rubs, or gallops. MTs in place. Abdomen: Soft, non-tender, non-distended with normoactive bowel sounds. No hepatomegaly. No rebound/guarding. No obvious abdominal masses. Extremities: mild edema.  Distal pedal pulses are 2+ and equal bilaterally. Neuro: Alert and oriented X 3. Moves all extremities spontaneously. Psych:  Responds to questions appropriately with a normal affect.  ASSESSMENT AND PLAN: 1. CAD s/p inferior MI in Dec 2016 with DES to distal RCA. Large thrombus burden. Residual severe LAD disease. S/p CABG x 4 yesterday. Hemodynamics look very good. weaning pressors today. Continue ASA. Consider resuming Plavix prior to DC.  2. Chronic systolic CHF. EF 30-35%. Normal PA pressures. Diuresis as needed per CT surgery. 3. Hyperlipidemia on statin. 4. Multiple complications at time of inferior MI including AFib, V fib, PE, CHB. All resolved.   Present on Admission:  . Paroxysmal atrial fibrillation ([place]) . HTN (hypertension) . Chronic combined systolic and diastolic CHF (congestive heart failure) ([place]) . CAD (coronary artery disease)  Signed, Genae Strine [place], [place] 04/07/2015 9:04 AM

## 2015-04-07 NOTE — Progress Notes (Signed)
TCTS BRIEF SICU PROGRESS NOTE  1 Day Post-Op  S/P Procedure(s) (LRB): CORONARY ARTERY BYPASS GRAFT TIMES FOUR  USING LEFT INTERNAL MAMMARY ARTERY TO THE LEFT ANTERIOR DESCENDING, BILATERAL GREATER SAPHENOUS ENDOVEIN HARVEST GRAFT TO PROXIMAL AND MID POSTERIOR DESCENDING AND DIAGONAL CORONARY ARTERIES AND PLACEMENT OR RIGHT FEMORAL A-LINE.  (N/A) TRANSESOPHAGEAL ECHOCARDIOGRAM (TEE) (N/A)   Stable day Maintaining NSR w/ stable hemodynamics on Neo and dopamine Breathing comfortably w/ O2 sats 92-98% on 2 L/min via [place] Chest tube output low UOP adequate Labs okay  Plan: Continue current plan  Rexene Alberts, MD 04/07/2015 7:30 PM

## 2015-04-08 ENCOUNTER — Encounter (HOSPITAL_COMMUNITY): Payer: 59

## 2015-04-08 ENCOUNTER — Inpatient Hospital Stay (HOSPITAL_COMMUNITY): Payer: 59

## 2015-04-08 ENCOUNTER — Ambulatory Visit (HOSPITAL_COMMUNITY): Payer: Medicare Other

## 2015-04-08 LAB — BASIC METABOLIC PANEL
ANION GAP: 7 (ref 5–15)
Anion gap: 11 (ref 5–15)
BUN: 11 mg/dL (ref 6–20)
BUN: 17 mg/dL (ref 6–20)
CHLORIDE: 101 mmol/L (ref 101–111)
CO2: 26 mmol/L (ref 22–32)
CO2: 23 mmol/L (ref 22–32)
Calcium: 8.1 mg/dL — ABNORMAL LOW (ref 8.9–10.3)
Calcium: 7.9 mg/dL — ABNORMAL LOW (ref 8.9–10.3)
Chloride: 101 mmol/L (ref 101–111)
Creatinine, Ser: 0.88 mg/dL (ref 0.61–1.24)
Creatinine, Ser: 1.31 mg/dL — ABNORMAL HIGH (ref 0.61–1.24)
GFR calc Af Amer: >60 mL/min (ref >60)
GFR calc Af Amer: >60 mL/min (ref >60)
GFR calc non Af Amer: >60 mL/min (ref >60)
GFR calc non Af Amer: 54 mL/min — ABNORMAL LOW (ref >60)
GLUCOSE: 109 mg/dL — AB (ref 65–99)
Glucose, Bld: 178 mg/dL — ABNORMAL HIGH (ref 65–99)
POTASSIUM: 5.7 mmol/L — AB (ref 3.5–5.1)
Potassium: 5.2 mmol/L — ABNORMAL HIGH (ref 3.5–5.1)
Sodium: 134 mmol/L — ABNORMAL LOW (ref 135–145)
Sodium: 135 mmol/L (ref 135–145)

## 2015-04-08 LAB — CBC
HEMATOCRIT: 27.9 % — AB (ref 39.0–52.0)
HEMOGLOBIN: 8.8 g/dL — AB (ref 13.0–17.0)
MCH: 27.4 pg (ref 26.0–34.0)
MCHC: 31.5 g/dL (ref 30.0–36.0)
MCV: 86.9 fL (ref 78.0–100.0)
Platelets: 166 10*3/uL (ref 150–400)
RBC: 3.21 MIL/uL — ABNORMAL LOW (ref 4.22–5.81)
RDW: 16.6 % — AB (ref 11.5–15.5)
WBC: 17 10*3/uL — ABNORMAL HIGH (ref 4.0–10.5)

## 2015-04-08 LAB — GLUCOSE, CAPILLARY
GLUCOSE-CAPILLARY: 119 mg/dL — AB (ref 65–99)
Glucose-Capillary: 111 mg/dL — ABNORMAL HIGH (ref 65–99)
Glucose-Capillary: 102 mg/dL — ABNORMAL HIGH (ref 65–99)
Glucose-Capillary: 135 mg/dL — ABNORMAL HIGH (ref 65–99)
Glucose-Capillary: 131 mg/dL — ABNORMAL HIGH (ref 65–99)
Glucose-Capillary: 128 mg/dL — ABNORMAL HIGH (ref 65–99)

## 2015-04-08 LAB — HEPATIC FUNCTION PANEL
ALT: 27 U/L (ref 17–63)
AST: 29 U/L (ref 15–41)
Albumin: 3 g/dL — ABNORMAL LOW (ref 3.5–5.0)
Alkaline Phosphatase: 40 U/L (ref 38–126)
Bilirubin, Direct: 0.2 mg/dL (ref 0.1–0.5)
Indirect Bilirubin: 0.6 mg/dL (ref 0.3–0.9)
Total Bilirubin: 0.8 mg/dL (ref 0.3–1.2)
Total Protein: 4.8 g/dL — ABNORMAL LOW (ref 6.5–8.1)

## 2015-04-08 LAB — MAGNESIUM: Magnesium: 2.3 mg/dL (ref 1.7–2.4)

## 2015-04-08 LAB — TSH: TSH: 11.05 u[IU]/mL — ABNORMAL HIGH (ref 0.350–4.500)

## 2015-04-08 LAB — POTASSIUM: Potassium: 6.1 mmol/L (ref 3.5–5.1)

## 2015-04-08 MED ORDER — AMIODARONE HCL IN DEXTROSE 360-4.14 MG/200ML-% IV SOLN
30.0000 mg/h | INTRAVENOUS | Status: DC
Start: 2015-04-09 — End: 2015-04-10
  Administered 2015-04-09 – 2015-04-10 (×3): 30 mg/h via INTRAVENOUS
  Filled 2015-04-08 (×3): qty 200

## 2015-04-08 MED ORDER — AMIODARONE LOAD VIA INFUSION: 150.0000 mg | Freq: Once | INTRAVENOUS | Status: DC

## 2015-04-08 MED ORDER — CLOPIDOGREL BISULFATE 75 MG PO TABS
75.0000 mg | ORAL_TABLET | Freq: Every day | ORAL | Status: DC
  Administered 2015-04-08 – 2015-04-13 (×6): 75 mg via ORAL
  Filled 2015-04-08 (×6): qty 1

## 2015-04-08 MED ORDER — FUROSEMIDE 10 MG/ML IJ SOLN
40.0000 mg | Freq: Once | INTRAMUSCULAR | Status: AC
  Administered 2015-04-08: 40 mg via INTRAVENOUS
  Filled 2015-04-08: qty 4

## 2015-04-08 MED ORDER — SODIUM CHLORIDE 0.9% FLUSH
10.0000 mL | INTRAVENOUS | Status: DC | PRN
  Administered 2015-04-08 – 2015-04-10 (×2): 10 mL
  Administered 2015-04-12: 20 mL
  Filled 2015-04-08 (×3): qty 40

## 2015-04-08 MED ORDER — AMIODARONE HCL 200 MG PO TABS: 400.0000 mg | ORAL_TABLET | Freq: Every day | ORAL | Status: DC

## 2015-04-08 MED ORDER — ACETAMINOPHEN 500 MG PO TABS
1000.0000 mg | ORAL_TABLET | Freq: Four times a day (QID) | ORAL | Status: DC
  Administered 2015-04-08 – 2015-04-11 (×7): 1000 mg via ORAL
  Filled 2015-04-08 (×6): qty 2

## 2015-04-08 MED ORDER — ALBUMIN HUMAN 5 % IV SOLN
12.5000 g | Freq: Once | INTRAVENOUS | Status: AC
  Administered 2015-04-08: 12.5 g via INTRAVENOUS

## 2015-04-08 MED ORDER — LEVOTHYROXINE SODIUM 25 MCG PO TABS
25.0000 ug | ORAL_TABLET | Freq: Every day | ORAL | Status: DC
  Administered 2015-04-09 – 2015-04-13 (×5): 25 ug via ORAL
  Filled 2015-04-08 (×5): qty 1

## 2015-04-08 MED ORDER — ENOXAPARIN SODIUM 30 MG/0.3ML ~~LOC~~ SOLN: 30.0000 mg | SUBCUTANEOUS | Status: DC

## 2015-04-08 MED ORDER — SODIUM CHLORIDE 0.9 % IV SOLN
INTRAVENOUS | Status: DC
  Administered 2015-04-08: 14:00:00 via INTRAVENOUS

## 2015-04-08 MED ORDER — ALBUMIN HUMAN 5 % IV SOLN
INTRAVENOUS | Status: AC
  Filled 2015-04-08: qty 250

## 2015-04-08 MED ORDER — AMIODARONE HCL 200 MG PO TABS: 400.0000 mg | ORAL_TABLET | Freq: Two times a day (BID) | ORAL | Status: DC

## 2015-04-08 MED ORDER — ACETAMINOPHEN 160 MG/5ML PO SOLN: 1000.0000 mg | Freq: Four times a day (QID) | ORAL | Status: DC

## 2015-04-08 MED ORDER — ALBUMIN HUMAN 25 % IV SOLN
12.5000 g | Freq: Once | INTRAVENOUS | Status: DC
  Filled 2015-04-08: qty 50

## 2015-04-08 MED ORDER — AMIODARONE HCL IN DEXTROSE 360-4.14 MG/200ML-% IV SOLN: 60.0000 mg/h | INTRAVENOUS | Status: DC

## 2015-04-08 MED ORDER — AMIODARONE HCL IN DEXTROSE 360-4.14 MG/200ML-% IV SOLN: 30.0000 mg/h | INTRAVENOUS | Status: DC

## 2015-04-08 MED ORDER — SODIUM POLYSTYRENE SULFONATE 15 GM/60ML PO SUSP
30.0000 g | Freq: Once | ORAL | Status: AC
  Administered 2015-04-08: 30 g via ORAL
  Filled 2015-04-08: qty 120

## 2015-04-08 MED ORDER — AMIODARONE HCL IN DEXTROSE 360-4.14 MG/200ML-% IV SOLN
60.0000 mg/h | INTRAVENOUS | Status: DC
  Administered 2015-04-08: 60 mg/h via INTRAVENOUS
  Filled 2015-04-08: qty 200

## 2015-04-08 MED ORDER — AMIODARONE HCL IN DEXTROSE 360-4.14 MG/200ML-% IV SOLN
30.0000 mg/h | INTRAVENOUS | Status: DC
  Filled 2015-04-08: qty 200

## 2015-04-08 MED ORDER — AMIODARONE LOAD VIA INFUSION
150.0000 mg | Freq: Once | INTRAVENOUS | Status: AC
  Administered 2015-04-08: 150 mg via INTRAVENOUS
  Filled 2015-04-08: qty 83.34

## 2015-04-08 MED ORDER — SODIUM POLYSTYRENE SULFONATE 15 GM/60ML PO SUSP: 30.0000 g | Freq: Once | ORAL | Status: DC

## 2015-04-08 NOTE — Progress Notes (Signed)
CT surgery p.m. Rounds  Patient back in sinus rhythm on IV amiodarone protocol Potassium has been elevated greater than 5.0-repeat level pending after oral Kayexalate Otherwise progressing.

## 2015-04-08 NOTE — Op Note (Deleted)
NAMEOTHER, SCHNITZER NO.:  [id number]  MEDICAL RECORD NO.:  [id number]  LOCATION:  [facility]                       FACILITY:  [facility]  PHYSICIAN:  Lanelle Bal, MD    DATE OF BIRTH:  10-Nov-1945  DATE OF PROCEDURE:  04/06/2015 DATE OF DISCHARGE:  04/13/2015                              OPERATIVE REPORT   PREOPERATIVE DIAGNOSIS:  Coronary occlusive disease with severe left ventricular dysfunction.  POSTOPERATIVE DIAGNOSIS:  Coronary occlusive disease with severe left ventricular dysfunction.  PROCEDURE PERFORMED:  Coronary artery bypass grafting x4 with the left internal mammary to the left anterior descending coronary artery, reverse saphenous vein graft to the diagonal coronary artery, sequential reverse saphenous vein graft to the proximal and mid posterior descending coronary artery with bilateral greater saphenous via vein harvesting endoscopically and insertion of right femoral arterial line.  SURGEON: Lanelle Bal, MD   FIRST ASSISTANT:  Ellwood Handler, PA.  BRIEF HISTORY:  The patient is a 70 year old male, who in December 2016 presented to Dr. Martinique with an acute inferior myocardial infarction with cardiogenic shock.  He underwent acute angioplasty of the large dominant right coronary artery, but had significant residual disease including 95 or greater stenosis of the LAD and diagonal with a very small circumflex system.  The patient's post infarct course was stormy with development of atrial fibrillation, was started on Xarelto for possible pulmonary emboli, ultimately was discharged home.  In late March, he was then referred to cardiac surgery for consideration of coronary artery bypass grafting.  The patient was on Plavix, had a new drug-eluting stent in, had been on Brilinta and Xarelto.  His LV function had a 20% ejection fraction, but the patient was functional with class 2 heart failure symptoms.  He had no angina with the patient's poor LV  function, critical anatomy of his not infarct vessel with residual disease in the posterior descending and just distal to the stent and diffuse disease throughout the right coronary artery of 50%. I recommended to the patient that we proceed with coronary artery bypass grafting.  He was re-cathed, admitted to the hospital by Cardiology for transition off Xarelto and Plavix and placed on heparin.  During this time, he was also diuresed almost 4 L with his peripheral edema improved.  Risks and options were discussed with the patient in detail including the increased risk of surgery related to his poor LV function. He agreed and signed informed consent.  DESCRIPTION OF PROCEDURE:  With Swan-Ganz and arterial line monitors in place, the patient underwent general endotracheal anesthesia without incident.  Skin of chest and legs were prepped with Betadine and draped in the usual sterile manner.  TEE probe was placed by Dr. Ermalene Postin confirming trace mitral regurgitation, decreased ejection fraction closer to 20%-25% with global hypokinesis.  After appropriate time-out was performed, we proceeded with Endo vein harvesting from the right thigh of the right greater saphenous vein.  After 1 segment of this, the vein became small at knee.  An additional segment was harvested from the left thigh endoscopically.  Median sternotomy was performed.  Left internal mammary artery was dissected down as a pedicle graft.  The distal artery was divided, had good  free flow.  Pericardium was opened. Overall, the ventricle was dilated with global hypokinesis and evidence of inferior scar.  The patient was systemically heparinized.  Ascending aorta was cannulated.  The right atrium was cannulated.  An aortic root vent cardioplegia needle was introduced into the ascending aorta.  The patient was placed on cardiopulmonary bypass 2.4 L/m2.  Sites of anastomosis were selected and dissected out of the epicardium.   The patient's body temperature was cooled to 32 degrees.  Aortic crossclamp was applied and 500 mL of cold blood potassium cardioplegia was administered with diastolic arrest of the heart.  Myocardial septal temperature was monitored throughout the cross-clamp.  We turned our attention first to the right coronary artery.  Distal to the stent, there was narrowing of the right coronary artery and disease in the proximal posterior descending.  The most suitable place for bypass was in the proximal posterior descending as the distal right coronary artery was significantly calcified.  The proximal PD was opened, admitted 1.5 mm probe.  Retrograde, using a diamond-type, side-to-side anastomosis, longitudinally the vein was anastomosed to the proximal posterior descending coronary artery.  Distal extent of the same vein was carried out on to the midportion of the posterior descending, which was opened, distal to the PD disease.  Distal anastomosis was performed.  Additional cold blood cardioplegia was administered down the vein graft.  The heart was then elevated and the diagonal coronary artery was opened and was 1.2-1.3 mm in size using a running 7-0 Prolene, distal anastomosis was performed with a second reverse saphenous vein graft.  We then turned our attention to the left anterior descending coronary artery, which was a relatively small vessel, but of good quality.  The vessel was opened, admitted 1 mm probe distally.  The vessel was 1.2-1.3 mm in size.  Using a running 8-0 Prolene, left internal mammary artery was anastomosed to the left anterior descending coronary artery.  With release of the bulldog on the mammary artery, there was appropriate rise in myocardial temperature.  Bulldog on the mammary artery was removed with rise in myocardial septal temperature.  The bulldog was placed back on the mammary artery with crossclamp still in place.  Two punch aortotomies were performed and  each of the 2 vein grafts were anastomosed to the ascending aorta.  Air was evacuated from the grafts and the ascending aorta.  Aortic cross-clamp was removed with total cross-clamp time of 82 minutes.  Bulldog on the mammary artery has been removed with prompt rise in myocardial septal temperature.  Sites of anastomosis were inspected.  There was some bleeding from the right graft at the distal at the proximal anastomosis.  This was repaired with 8-0 Prolene.  We then placed atrial and ventricular pacing wires.  The patient was ventilated and on low-dose dopamine, Milrinone and low-dose epinephrine was then ventilated and weaned from cardiopulmonary bypass without difficulty.  He remained hemodynamically stable.  He was decannulated in usual fashion.  Protamine sulfate was administered.  With the operative field hemostatic, atrial and ventricular pacing wire had been applied. The left chest tube and a Blake mediastinal drain were left in place. Sternum was closed with #6 stainless steel wire.  Fascia was closed with interrupted 0 Vicryl and 3-0 Vicryl subcutaneous tissue, 3-0 subcuticular stitch in skin edges.  Dry dressings were applied.  Sponge and needle count was reported as correct at the completion of procedure. Total pump time was 140 minutes.  The patient did not require any  blood bank blood products during the operative procedure.  After the patient was prepped, a needle was introduced into the right femoral artery and a guidewire was placed in the right femoral artery and a single lumen central line was placed into the femoral artery to function as an arterial line and also to provide easy access showed intraaortic balloon pump be necessary.     Lanelle Bal, MD     EG/MEDQ  D:  04/07/2015  T:  04/07/2015  Job:  [id number]

## 2015-04-08 NOTE — Discharge Summary (Signed)
Physician Discharge Summary  Patient ID: Ryan Ward MRN: [id number] DOB/AGE: [birth year] 70 y.o.  Admit date: 04/01/2015 Discharge date: 04/13/2015  Admission Diagnoses: CAD  Discharge Diagnoses:  Principal Problem:   CAD S/P percutaneous coronary angioplasty Active Problems:   HTN (hypertension)   Paroxysmal atrial fibrillation (HCC)   Chronic combined systolic and diastolic CHF (congestive heart failure) (HCC)   CAD (coronary artery disease)   S/P CABG x 4  Patient Active Problem List   Diagnosis Date Noted  . S/P CABG x 4 04/06/2015  . CAD (coronary artery disease) 04/01/2015  . Chronic combined systolic and diastolic CHF (congestive heart failure) ([place]) 12/28/2014  . Hyperlipidemia 12/28/2014  . Pulmonary embolism without acute cor pulmonale ([place])   . Paroxysmal atrial fibrillation (HCC)   . Acute combined systolic and diastolic heart failure ([place])   . Hematuria 12/08/2014  . Ventricular fibrillation ([place]) 12/08/2014  . CAD S/P percutaneous coronary angioplasty 12/08/2014  . ST elevation (STEMI) myocardial infarction involving right coronary artery ([place]) 12/07/2014  . Complete heart block ([place]) 12/07/2014  . Cardiogenic shock ([place]) 12/07/2014  . HTN (hypertension) 12/07/2014     History of Present Illness:  Ryan Ward 70 y.o. male is seen in the office for Consideration of coronary artery bypass graft. The patient's last heart catheterization was at the time of acute angioplasty of an occluded right coronary artery and the patient was in cardiogenic shock after an inferior myocardial infarction. He was Admitted 12/07/2014 with inferior STEMI, complicated by complete heart block and cardiogenic shock. He was managed with vasopressors and temporary pacemaker and IABP . LHC demonstrated severe 3 vessel CAD with an occluded RCA, severe proximal mid LAD disease and moderate to severe ostial LCx disease (small vessel). He underwent PCI with a DES to the RCA.  Hospitalization was complicated by VF arrest post PCI related to pacemaker spike on T-wave. EF was 35-45% by cardiac catheterization. Last echocardiogram was December 6. He developed volume excess and required IV Lasix. Hospitalization was further complicated by R posterior tibial and peroneal vein DVT and PE to the right upper lobe. He was placed on heparin and transitioned to Xarelto; Brilinta was stopped and he was placed on Plavix. He developed atrial fibrillation and converted to NSR on IV amiodarone. He had recurrent atrial fibrillation and diltiazem was discontinued. He was placed on amiodarone.   Repeat cardiac catheterization was done several days ago,  The patient has progressed well since MI. He goes to Cardiac Rehab 3 times a week. No chest pain or SOB. Taking lasix daily with some 3+ pitting edema bilaterally. He has had no classic anginal symptoms since discharge home.   He was admitted this hospitalization for the procedure.  Discharged Condition: good  Hospital Course: Patient was admitted for planned CABG after a Plavix washout. He was kept on intravenous heparin preoperatively. He was felt to be stable for the below described procedure which was performed on 04/06/2015 by Dr. Servando Snare. He tolerated well was taken to the surgical intensive care unit in stable condition. Postoperatively, he has remained neurologically intact. He has remained hemodynamically stable. All routine lines, monitors and drainage devices have been discontinued in the standard fashion. He has some postoperative volume overload which is responding to diuretics. He did initially require some inotropic support which is being weaned. His Plavix has been restarted. His diabetes management has been by standard protocols. He is advancing activities using standard cardiac rehabilitation protocols. Incisions are healing well without evidence of infection. He is  tolerating diet.Cardiology has assisted with home  medication regimine and he will have early follow-up (1 week).   Consults: None  Significant Diagnostic Studies: routine post-op labs and serial CXR's  Treatments: DATE OF PROCEDURE: 04/06/2015 DATE OF DISCHARGE:   OPERATIVE REPORT   PREOPERATIVE DIAGNOSIS: Coronary occlusive disease with severe left ventricular dysfunction.  POSTOPERATIVE DIAGNOSIS: Coronary occlusive disease with severe left ventricular dysfunction.  PROCEDURE PERFORMED: Coronary artery bypass grafting x4 with the left internal mammary to the left anterior descending coronary artery, reverse saphenous vein graft to the diagonal coronary artery, sequential reverse saphenous vein graft to the proximal and mid posterior descending coronary artery with bilateral greater saphenous via vein harvesting endoscopically and insertion of right femoral arterial line.  FIRST ASSISTANT: Ellwood Handler, PA.  Discharge Exam: Blood pressure 103/65, pulse 57, temperature 97.3 F (36.3 C), temperature source Oral, resp. rate 18, height 5\' 7"  (1.702 m), weight 210 lb 3.2 oz (95.346 kg), SpO2 99 %.   General appearance: alert, cooperative and no distress Heart: regular rate and rhythm Lungs: clear to auscultation bilaterally Abdomen: benign Extremities: + edema Wound: incis healing well  Disposition: 01-Home or Self Care     Medication List    STOP taking these medications        metoprolol tartrate 25 MG tablet  Commonly known as:  LOPRESSOR     nitroGLYCERIN 0.4 MG SL tablet  Commonly known as:  NITROSTAT      TAKE these medications        amiodarone 400 MG tablet  Commonly known as:  PACERONE  Take 1 tablet (400 mg total) by mouth 2 (two) times daily. For 1 week, then 400 mg once daily for 2 weeks, then 200 mg daily.     aspirin EC 81 MG tablet  Take 1 tablet (81 mg total) by mouth daily.     atorvastatin 80 MG tablet  Commonly known as:  LIPITOR  Take 1 tablet  (80 mg total) by mouth daily at 6 PM.     clopidogrel 75 MG tablet  Commonly known as:  PLAVIX  Take 1 tablet (75 mg total) by mouth daily.     ferrous [id number] C-folic acid capsule  Commonly known as:  TRINSICON / FOLTRIN  Take 1 capsule by mouth 3 (three) times daily after meals.     furosemide 40 MG tablet  Commonly known as:  LASIX  Take 1 tablet (40 mg total) by mouth daily.     levothyroxine 25 MCG tablet  Commonly known as:  SYNTHROID, LEVOTHROID  Take 1 tablet (25 mcg total) by mouth daily before breakfast.     multivitamin with minerals Tabs tablet  Take 1 tablet by mouth daily.     Potassium Chloride ER 20 MEQ Tbcr  Take 20 mEq by mouth daily.     traMADol 50 MG tablet  Commonly known as:  ULTRAM  Take 1-2 tablets (50-100 mg total) by mouth every 6 (six) hours as needed for moderate pain.     Vitamin D3 5000 units Tabs  Take 5,000 Units by mouth daily.       Follow-up Information    Follow up with [name], MD.   Specialty:  Cardiothoracic Surgery   Why:  05/05/2015 at 11:30 AM to see the surgeon. Please obtain a chest x-ray at [facility] at [place] [facility] is located in the same office complex.   Contact information:   [place]  [id number] [phone]       Follow up with [facility] On 04/21/2015.   Specialty:  Cardiology   Why:  @11 :30 am for TCM with Rogelia Mire, NP   Contact information:   [address] [place] [phone]     The patient has been discharged on:   1.Beta Blocker:  Yes [  Y ]                              No   [   ]                              If No, reason:  2.Ace Inhibitor/ARB: Yes [   ]                                     No  [ N   ]                                     If No, reason:BP LOW  3.Statin:   Yes [ Y  ]                  No  [   ]                  If No, reason:  4.[place]:  Yes  [ Y   ]                  No   [   ]                  If No, reason:  Will need BMET at 1 week cardiology appt   Signed: Glenn Christo E 04/13/2015, 8:28 AM

## 2015-04-08 NOTE — Op Note (Signed)
NAMEVERLYN, SAURMAN NO.:  [id number]  MEDICAL RECORD NO.:  [id number]  LOCATION:  [facility]                       FACILITY:  [facility]  PHYSICIAN:  Lanelle Bal, MD    DATE OF BIRTH:  [birth year]  DATE OF PROCEDURE:  04/06/2015 DATE OF DISCHARGE:  04/13/2015                              OPERATIVE REPORT   PREOPERATIVE DIAGNOSIS:  Coronary occlusive disease with severe left ventricular dysfunction.  POSTOPERATIVE DIAGNOSIS:  Coronary occlusive disease with severe left ventricular dysfunction.  PROCEDURE PERFORMED:  Coronary artery bypass grafting x4 with the left internal mammary to the left anterior descending coronary artery, reverse saphenous vein graft to the diagonal coronary artery, sequential reverse saphenous vein graft to the proximal and mid posterior descending coronary artery with bilateral greater saphenous via vein harvesting endoscopically and insertion of right femoral arterial line.  SURGEON: Lanelle Bal, MD   FIRST ASSISTANT:  Ellwood Handler, PA.  BRIEF HISTORY:  The patient is a 70 year old male, who in December 2016 presented to Dr. Martinique with an acute inferior myocardial infarction with cardiogenic shock.  He underwent acute angioplasty of the large dominant right coronary artery, but had significant residual disease including 95 or greater stenosis of the LAD and diagonal with a very small circumflex system.  The patient's post infarct course was stormy with development of atrial fibrillation, was started on Xarelto for possible pulmonary emboli, ultimately was discharged home.  In late March, he was then referred to cardiac surgery for consideration of coronary artery bypass grafting.  The patient was on Plavix, had a new drug-eluting stent in, had been on Brilinta and Xarelto.  His LV function had a 20% ejection fraction, but the patient was functional with class 2 heart failure symptoms.  He had no angina with the patient's poor LV  function, critical anatomy of his not infarct vessel with residual disease in the posterior descending and just distal to the stent and diffuse disease throughout the right coronary artery of 50%. I recommended to the patient that we proceed with coronary artery bypass grafting.  He was re-cathed, admitted to the hospital by Cardiology for transition off Xarelto and Plavix and placed on heparin.  During this time, he was also diuresed almost 4 L with his peripheral edema improved.  Risks and options were discussed with the patient in detail including the increased risk of surgery related to his poor LV function. He agreed and signed informed consent.  DESCRIPTION OF PROCEDURE:  With Swan-Ganz and arterial line monitors in place, the patient underwent general endotracheal anesthesia without incident.  Skin of chest and legs were prepped with Betadine and draped in the usual sterile manner.  TEE probe was placed by Dr. Ermalene Postin confirming trace mitral regurgitation, decreased ejection fraction closer to 20%-25% with global hypokinesis.  After appropriate time-out was performed, we proceeded with Endo vein harvesting from the right thigh of the right greater saphenous vein.  After 1 segment of this, the vein became small at knee.  An additional segment was harvested from the left thigh endoscopically.  Median sternotomy was performed.  Left internal mammary artery was dissected down as a pedicle graft.  The distal artery was divided, had good  free flow.  Pericardium was opened. Overall, the ventricle was dilated with global hypokinesis and evidence of inferior scar.  The patient was systemically heparinized.  Ascending aorta was cannulated.  The right atrium was cannulated.  An aortic root vent cardioplegia needle was introduced into the ascending aorta.  The patient was placed on cardiopulmonary bypass 2.4 L/m2.  Sites of anastomosis were selected and dissected out of the epicardium.   The patient's body temperature was cooled to 32 degrees.  Aortic crossclamp was applied and 500 mL of cold blood potassium cardioplegia was administered with diastolic arrest of the heart.  Myocardial septal temperature was monitored throughout the cross-clamp.  We turned our attention first to the right coronary artery.  Distal to the stent, there was narrowing of the right coronary artery and disease in the proximal posterior descending.  The most suitable place for bypass was in the proximal posterior descending as the distal right coronary artery was significantly calcified.  The proximal PD was opened, admitted 1.5 mm probe.  Retrograde, using a diamond-type, side-to-side anastomosis, longitudinally the vein was anastomosed to the proximal posterior descending coronary artery.  Distal extent of the same vein was carried out on to the midportion of the posterior descending, which was opened, distal to the PD disease.  Distal anastomosis was performed.  Additional cold blood cardioplegia was administered down the vein graft.  The heart was then elevated and the diagonal coronary artery was opened and was 1.2-1.3 mm in size using a running 7-0 Prolene, distal anastomosis was performed with a second reverse saphenous vein graft.  We then turned our attention to the left anterior descending coronary artery, which was a relatively small vessel, but of good quality.  The vessel was opened, admitted 1 mm probe distally.  The vessel was 1.2-1.3 mm in size.  Using a running 8-0 Prolene, left internal mammary artery was anastomosed to the left anterior descending coronary artery.  With release of the bulldog on the mammary artery, there was appropriate rise in myocardial temperature.  Bulldog on the mammary artery was removed with rise in myocardial septal temperature.  The bulldog was placed back on the mammary artery with crossclamp still in place.  Two punch aortotomies were performed and  each of the 2 vein grafts were anastomosed to the ascending aorta.  Air was evacuated from the grafts and the ascending aorta.  Aortic cross-clamp was removed with total cross-clamp time of 82 minutes.  Bulldog on the mammary artery has been removed with prompt rise in myocardial septal temperature.  Sites of anastomosis were inspected.  There was some bleeding from the right graft at the distal at the proximal anastomosis.  This was repaired with 8-0 Prolene.  We then placed atrial and ventricular pacing wires.  The patient was ventilated and on low-dose dopamine, Milrinone and low-dose epinephrine was then ventilated and weaned from cardiopulmonary bypass without difficulty.  He remained hemodynamically stable.  He was decannulated in usual fashion.  Protamine sulfate was administered.  With the operative field hemostatic, atrial and ventricular pacing wire had been applied. The left chest tube and a Blake mediastinal drain were left in place. Sternum was closed with #6 stainless steel wire.  Fascia was closed with interrupted 0 Vicryl and 3-0 Vicryl subcutaneous tissue, 3-0 subcuticular stitch in skin edges.  Dry dressings were applied.  Sponge and needle count was reported as correct at the completion of procedure. Total pump time was 140 minutes.  The patient did not require any  blood bank blood products during the operative procedure.  After the patient was prepped, a needle was introduced into the right femoral artery and a guidewire was placed in the right femoral artery and a single lumen central line was placed into the femoral artery to function as an arterial line and also to provide easy access showed intraaortic balloon pump be necessary.     Lanelle Bal, MD     EG/MEDQ  D:  04/07/2015  T:  04/07/2015  Job:  [id number]

## 2015-04-08 NOTE — Progress Notes (Signed)
Pt heart rate has continued to stay in the 150-160's between ST and Afib. Notified Dr Servando Snare and he wanted to ask Dr Martinique. Notified Dr Martinique and Dr Martinique requested Amiodarone protocol started. Upon starting the bolus Pt introducer was noted to be occluded. Dr Servando Snare aware. Ordered a PICC line STAT. Stated ok to run the Bolus thru the peripheral but then stop the drip until the PICC line is placed. Will notify PICC team of his orders.

## 2015-04-08 NOTE — Progress Notes (Signed)
Peripherally Inserted Central Catheter/Midline Placement  The IV Nurse has discussed with the patient and/or persons authorized to consent for the patient, the purpose of this procedure and the potential benefits and risks involved with this procedure.  The benefits include less needle sticks, lab draws from the catheter and patient may be discharged home with the catheter.  Risks include, but not limited to, infection, bleeding, blood clot (thrombus formation), and puncture of an artery; nerve damage and irregular heat beat.  Alternatives to this procedure were also discussed.  PICC/Midline Placement Documentation        Darlyn Read 04/08/2015, 5:16 PM

## 2015-04-08 NOTE — Discharge Instructions (Signed)
Endoscopic Saphenous Vein Harvesting, Care After °Refer to this sheet in the next few weeks. These instructions provide you with information on caring for yourself after your procedure. Your health care provider may also give you more specific instructions. Your treatment has been planned according to current medical practices, but problems sometimes occur. Call your health care provider if you have any problems or questions after your procedure. °HOME CARE INSTRUCTIONS °Medicine °· Take whatever pain medicine your surgeon prescribes. Follow the directions carefully. Do not take over-the-counter pain medicine unless your surgeon says it is okay. Some pain medicine can cause bleeding problems for several weeks after surgery. °· Follow your surgeon's instructions about driving. You will probably not be permitted to drive after heart surgery. °· Take any medicines your surgeon prescribes. Any medicines you took before your heart surgery should be checked with your health care provider before you start taking them again. °Wound care °· If your surgeon has prescribed an elastic bandage or stocking, ask how long you should wear it. °· Check the area around your surgical cuts (incisions) whenever your bandages (dressings) are changed. Look for any redness or swelling. °· You will need to return to have the stitches (sutures) or staples taken out. Ask your surgeon when to do that. °· Ask your surgeon when you can shower or bathe. °Activity °· Try to keep your legs raised when you are sitting. °· Do any exercises your health care providers have given you. These may include deep breathing exercises, coughing, walking, or other exercises. °SEEK MEDICAL CARE IF: °· You have any questions about your medicines. °· You have more leg pain, especially if your pain medicine stops working. °· New or growing bruises develop on your leg. °· Your leg swells, feels tight, or becomes red. °· You have numbness in your leg. °SEEK IMMEDIATE  MEDICAL CARE IF: °· Your pain gets much worse. °· Blood or fluid leaks from any of the incisions. °· Your incisions become warm, swollen, or red. °· You have chest pain. °· You have trouble breathing. °· You have a fever. °· You have more pain near your leg incision. °MAKE SURE YOU: °· Understand these instructions. °· Will watch your condition. °· Will get help right away if you are not doing well or get worse. °  °This information is not intended to replace advice given to you by your health care provider. Make sure you discuss any questions you have with your health care provider. °  °Document Released: 08/30/2010 Document Revised: 01/08/2014 Document Reviewed: 08/30/2010 °Elsevier Interactive Patient Education ©2016 [employer] °Coronary Artery Bypass Grafting, Care After °These instructions give you information on caring for yourself after your procedure. Your doctor may also give you more specific instructions. Call your doctor if you have any problems or questions after your procedure.  °HOME CARE °· Only take medicine as told by your doctor. Take medicines exactly as told. Do not stop taking medicines or start any new medicines without talking to your doctor first. °· Take your pulse as told by your doctor. °· Do deep breathing as told by your doctor. Use your breathing device (incentive spirometer), if given, to practice deep breathing several times a day. Support your chest with a pillow or your arms when you take deep breaths or cough. °· Keep the area clean, dry, and protected where the surgery cuts (incisions) were made. Remove bandages (dressings) only as told by your doctor. If strips were applied to surgical area, do not take   them off. They fall off on their own. °· Check the surgery area daily for puffiness (swelling), redness, or leaking fluid. °· If surgery cuts were made in your legs: °· Avoid crossing your legs. °· Avoid sitting for long periods of time. Change positions every 30  minutes. °· Raise your legs when you are sitting. Place them on pillows. °· Wear stockings that help keep blood clots from forming in your legs (compression stockings). °· Only take sponge baths until your doctor says it is okay to take showers. Pat the surgery area dry. Do not rub the surgery area with a washcloth or towel. Do not bathe, swim, or use a hot tub until your doctor says it is okay. °· Eat foods that are high in fiber. These include raw fruits and vegetables, whole grains, beans, and nuts. Choose lean meats. Avoid canned, processed, and fried foods. °· Drink enough fluids to keep your pee (urine) clear or pale yellow. °· Weigh yourself every day. °· Rest and limit activity as told by your doctor. You may be told to: °· Stop any activity if you have chest pain, shortness of breath, changes in heartbeat, or dizziness. Get help right away if this happens. °· Move around often for short amounts of time or take short walks as told by your doctor. Gradually become more active. You may need help to strengthen your muscles and build endurance. °· Avoid lifting, pushing, or pulling anything heavier than 10 pounds (4.5 kg) for at least 6 weeks after surgery. °· Do not drive until your doctor says it is okay. °· Ask your doctor when you can go back to work. °· Ask your doctor when you can begin sexual activity again. °· Follow up with your doctor as told. °GET HELP IF: °· You have puffiness, redness, more pain, or fluid draining from the incision site. °· You have a fever. °· You have puffiness in your ankles or legs. °· You have pain in your legs. °· You gain 2 or more pounds (0.9 kg) a day. °· You feel sick to your stomach (nauseous) or throw up (vomit). °· You have watery poop (diarrhea). °GET HELP RIGHT AWAY IF: °· You have chest pain that goes to your jaw or arms. °· You have shortness of breath. °· You have a fast or irregular heartbeat. °· You notice a "clicking" in your breastbone when you move. °· You  have numbness or weakness in your arms or legs. °· You feel dizzy or light-headed. °MAKE SURE YOU: °· Understand these instructions. °· Will watch your condition. °· Will get help right away if you are not doing well or get worse. °  °This information is not intended to replace advice given to you by your health care provider. Make sure you discuss any questions you have with your health care provider. °  °Document Released: 12/23/2012 Document Reviewed: 12/23/2012 °Elsevier Interactive Patient Education ©2016 [employer] ° °

## 2015-04-08 NOTE — Progress Notes (Addendum)
Now in rapid  a fib 130-140 Was on cordrone several months ago but was stopped by cardiology Will resume by drip now, will need pic line asap central note working  I have seen and examined Ryan Ward and agree with the above assessment  and plan. Ryan Ward was stopped by cardiology several weeks ago prior to cath, They were not planning on resuming as PE treated 3 months, with a fib recurrent will lower threshold of restarting   [name] MD Beeper [phone] Office [phone] 04/08/2015 2:09 PM

## 2015-04-08 NOTE — Progress Notes (Signed)
Patient ID: Ryan Ward, male   DOB: March 02, 1945, 70 y.o.   MRN: [id number] TCTS DAILY ICU PROGRESS NOTE                   [address]            [place] [address]          [phone]   2 Days Post-Op Procedure(s) (LRB): CORONARY ARTERY BYPASS GRAFT TIMES FOUR  USING LEFT INTERNAL MAMMARY ARTERY TO THE LEFT ANTERIOR DESCENDING, BILATERAL GREATER SAPHENOUS ENDOVEIN HARVEST GRAFT TO PROXIMAL AND MID POSTERIOR DESCENDING AND DIAGONAL CORONARY ARTERIES AND PLACEMENT OR RIGHT FEMORAL A-LINE.  (N/A) TRANSESOPHAGEAL ECHOCARDIOGRAM (TEE) (N/A)  Total Length of Stay:  LOS: 7 days   Subjective: Alert neuro intact  Objective: Vital signs in last 24 hours: Temp:  [98.2 F (36.8 C)-99.5 F (37.5 C)] 98.5 F (36.9 C) (04/07 0715) Pulse Rate:  [82-110] 92 (04/07 0715) Cardiac Rhythm:  [-] Normal sinus rhythm (04/07 0700) Resp:  [0-29] 22 (04/07 0715) BP: (86-115)/(53-85) 96/62 mmHg (04/07 0715) SpO2:  [88 %-100 %] 99 % (04/07 0715) Arterial Line BP: (71-129)/(43-67) 103/49 mmHg (04/07 0715) Weight:  [209 lb (94.802 kg)] 209 lb (94.802 kg) (04/07 0615)  Filed Weights   04/06/15 0508 04/07/15 0500 04/08/15 0615  Weight: 185 lb 1.6 oz (83.961 kg) 195 lb 1.7 oz (88.5 kg) 209 lb (94.802 kg)    Weight change: 13 lb 14.3 oz (6.302 kg)   Hemodynamic parameters for last 24 hours: PAP: (27-31)/(10-13) 31/12 mmHg CO:  [6.1 L/min] 6.1 L/min CI:  [3.1 L/min/m2] 3.1 L/min/m2  Intake/Output from previous day: 04/06 0701 - 04/07 0700 In: 2065 [I.V.:1915; IV Piggyback:150] Out: 2055 [Urine:1485; Chest Tube:570]  Intake/Output this shift:    Current Meds: Scheduled Meds: . acetaminophen  1,000 mg Oral 4 times per day   Or  . acetaminophen (TYLENOL) oral liquid 160 mg/5 mL  1,000 mg Per Tube 4 times per day  . antiseptic oral rinse  7 mL Mouth Rinse BID  . aspirin EC  325 mg Oral Daily   Or  . aspirin  324 mg Per Tube Daily  . atorvastatin  80 mg Oral q1800  . bisacodyl   10 mg Oral Daily   Or  . bisacodyl  10 mg Rectal Daily  . cefUROXime (ZINACEF)  IV  1.5 g Intravenous Q12H  . cholecalciferol  5,000 Units Oral Daily  . docusate sodium  200 mg Oral Daily  . enoxaparin (LOVENOX) injection  30 mg Subcutaneous QHS  . insulin aspart  0-24 Units Subcutaneous 6 times per day  . insulin detemir  10 Units Subcutaneous Daily  . metoprolol tartrate  12.5 mg Oral BID   Or  . metoprolol tartrate  12.5 mg Per Tube BID  . pantoprazole  40 mg Oral Daily  . sodium chloride flush  3 mL Intravenous Q12H   Continuous Infusions: . sodium chloride    . sodium chloride Stopped (04/07/15 1700)  . DOPamine 5 mcg/kg/min (04/08/15 0700)  . lactated ringers Stopped (04/06/15 1530)  . lactated ringers 20 mL/hr at 04/07/15 1900  . nitroGLYCERIN Stopped (04/06/15 1530)  . phenylephrine (NEO-SYNEPHRINE) Adult infusion Stopped (04/08/15 0500)   PRN Meds:.ALPRAZolam, lactated ringers, metoprolol, morphine injection, ondansetron (ZOFRAN) IV, oxyCODONE, sodium chloride flush, traMADol, zolpidem  General appearance: alert and cooperative Neurologic: intact Heart: regular rate and rhythm, S1, S2 normal, no murmur, click, rub or gallop Lungs: diminished breath sounds bibasilar Abdomen: soft, non-tender;  bowel sounds normal; no masses,  no organomegaly Extremities: extremities normal, atraumatic, no cyanosis or edema and Homans sign is negative, no sign of DVT Wound: sternum stable  Lab Results: CBC: Recent Labs  04/07/15 1627 04/08/15 0430  WBC 15.9* 17.0*  HGB 9.5* 8.8*  HCT 29.4* 27.9*  PLT 164 166   BMET:  Recent Labs  04/07/15 0410  04/07/15 1625 04/07/15 1627 04/08/15 0430  NA 142  --  138  --  134*  K 3.5  --  5.0  --  5.7*  CL 112*  --  99*  --  101  CO2 22  --   --   --  26  GLUCOSE 102*  --  133*  --  109*  BUN 7  --  12  --  11  CREATININE 0.93  < > 0.80 0.86 0.88  CALCIUM 7.0*  --   --   --  8.1*  < > = values in this interval not displayed.    PT/INR:  Recent Labs  04/06/15 1515  LABPROT 19.4*  INR 1.64*   Radiology: Dg Chest Port 1 View  04/08/2015  CLINICAL DATA:  Status post coronary bypass graft. EXAM: PORTABLE CHEST 1 VIEW COMPARISON:  April 07, 2015. FINDINGS: Stable cardiomediastinal silhouette. Status post coronary artery bypass graft. Right internal jugular catheter has been removed with sheath left in place. Left-sided chest tube is unchanged without pneumothorax. Mild bibasilar atelectasis is noted. IMPRESSION: Left-sided chest tube is unchanged without evidence of pneumothorax. Stable bibasilar opacities. Electronically Signed   By: Marijo Conception, M.D.   On: 04/08/2015 07:30     Assessment/Plan: S/P Procedure(s) (LRB): CORONARY ARTERY BYPASS GRAFT TIMES FOUR  USING LEFT INTERNAL MAMMARY ARTERY TO THE LEFT ANTERIOR DESCENDING, BILATERAL GREATER SAPHENOUS ENDOVEIN HARVEST GRAFT TO PROXIMAL AND MID POSTERIOR DESCENDING AND DIAGONAL CORONARY ARTERIES AND PLACEMENT OR RIGHT FEMORAL A-LINE.  (N/A) TRANSESOPHAGEAL ECHOCARDIOGRAM (TEE) (N/A) Mobilize Diuresis Diabetes control d/c tubes/lines Hyperkalemia, repeat to get lasix this am Weaning drips Resume plavix  [name] 04/08/2015 7:48 AM

## 2015-04-09 ENCOUNTER — Inpatient Hospital Stay (HOSPITAL_COMMUNITY): Payer: 59

## 2015-04-09 DIAGNOSIS — I480 Paroxysmal atrial fibrillation: Secondary | ICD-10-CM

## 2015-04-09 LAB — POCT I-STAT, CHEM 8
BUN: 29 mg/dL — ABNORMAL HIGH (ref 6–20)
CHLORIDE: 94 mmol/L — AB (ref 101–111)
Calcium, Ion: 1.16 mmol/L (ref 1.13–1.30)
Creatinine, Ser: 1.5 mg/dL — ABNORMAL HIGH (ref 0.61–1.24)
GLUCOSE: 119 mg/dL — AB (ref 65–99)
HCT: 22 % — ABNORMAL LOW (ref 39.0–52.0)
Hemoglobin: 7.5 g/dL — ABNORMAL LOW (ref 13.0–17.0)
POTASSIUM: 3.8 mmol/L (ref 3.5–5.1)
Sodium: 134 mmol/L — ABNORMAL LOW (ref 135–145)
TCO2: 29 mmol/L (ref 0–100)

## 2015-04-09 LAB — CBC
HCT: 25.9 % — ABNORMAL LOW (ref 39.0–52.0)
Hemoglobin: 8.2 g/dL — ABNORMAL LOW (ref 13.0–17.0)
MCH: 27.5 pg (ref 26.0–34.0)
MCHC: 31.7 g/dL (ref 30.0–36.0)
MCV: 86.9 fL (ref 78.0–100.0)
Platelets: 175 10*3/uL (ref 150–400)
RBC: 2.98 MIL/uL — ABNORMAL LOW (ref 4.22–5.81)
RDW: 16.6 % — ABNORMAL HIGH (ref 11.5–15.5)
WBC: 16.1 10*3/uL — ABNORMAL HIGH (ref 4.0–10.5)

## 2015-04-09 LAB — GLUCOSE, CAPILLARY
GLUCOSE-CAPILLARY: 131 mg/dL — AB (ref 65–99)
GLUCOSE-CAPILLARY: 92 mg/dL (ref 65–99)
GLUCOSE-CAPILLARY: 107 mg/dL — AB (ref 65–99)
Glucose-Capillary: 113 mg/dL — ABNORMAL HIGH (ref 65–99)
Glucose-Capillary: 125 mg/dL — ABNORMAL HIGH (ref 65–99)
Glucose-Capillary: 99 mg/dL (ref 65–99)

## 2015-04-09 LAB — CARBOXYHEMOGLOBIN
Carboxyhemoglobin: 1.1 % (ref 0.5–1.5)
Carboxyhemoglobin: 1.3 % (ref 0.5–1.5)
Methemoglobin: 0.7 % (ref 0.0–1.5)
Methemoglobin: 0.7 % (ref 0.0–1.5)
O2 Saturation: 48.5 %
O2 Saturation: 57.1 %
Total hemoglobin: 8.7 g/dL — ABNORMAL LOW (ref 13.5–18.0)
Total hemoglobin: 7.9 g/dL — ABNORMAL LOW (ref 13.5–18.0)

## 2015-04-09 LAB — BASIC METABOLIC PANEL
Anion gap: 10 (ref 5–15)
BUN: 25 mg/dL — ABNORMAL HIGH (ref 6–20)
CO2: 27 mmol/L (ref 22–32)
Calcium: 8.3 mg/dL — ABNORMAL LOW (ref 8.9–10.3)
Chloride: 100 mmol/L — ABNORMAL LOW (ref 101–111)
Creatinine, Ser: 1.7 mg/dL — ABNORMAL HIGH (ref 0.61–1.24)
GFR calc Af Amer: 46 mL/min — ABNORMAL LOW (ref >60)
GFR calc non Af Amer: 39 mL/min — ABNORMAL LOW (ref >60)
Glucose, Bld: 126 mg/dL — ABNORMAL HIGH (ref 65–99)
Potassium: 5 mmol/L (ref 3.5–5.1)
Sodium: 137 mmol/L (ref 135–145)

## 2015-04-09 LAB — MAGNESIUM: Magnesium: 2.3 mg/dL (ref 1.7–2.4)

## 2015-04-09 MED ORDER — DOPAMINE-DEXTROSE 3.2-5 MG/ML-% IV SOLN
3.0000 ug/kg/min | INTRAVENOUS | Status: DC
  Administered 2015-04-09: 3 ug/kg/min via INTRAVENOUS

## 2015-04-09 MED ORDER — MILRINONE IN DEXTROSE 20 MG/100ML IV SOLN
0.1250 ug/kg/min | INTRAVENOUS | Status: DC
  Administered 2015-04-09 – 2015-04-10 (×3): 0.25 ug/kg/min via INTRAVENOUS
  Administered 2015-04-11 (×2): 0.125 ug/kg/min via INTRAVENOUS
  Filled 2015-04-09 (×5): qty 100

## 2015-04-09 MED ORDER — AMIODARONE LOAD VIA INFUSION
150.0000 mg | Freq: Once | INTRAVENOUS | Status: AC
  Administered 2015-04-09: 150 mg via INTRAVENOUS

## 2015-04-09 MED ORDER — DOPAMINE-DEXTROSE 3.2-5 MG/ML-% IV SOLN
INTRAVENOUS | Status: AC
  Filled 2015-04-09: qty 250

## 2015-04-09 MED ORDER — FUROSEMIDE 10 MG/ML IJ SOLN
40.0000 mg | Freq: Every day | INTRAMUSCULAR | Status: DC
  Administered 2015-04-09: 40 mg via INTRAVENOUS
  Filled 2015-04-09: qty 4

## 2015-04-09 NOTE — Progress Notes (Signed)
Subjective: 70 yo with hx of CAD S/p CABG on April 5. Had post op atrial fib  Is feeling well    Filed Vitals:   04/09/15 0845 04/09/15 0850 04/09/15 0900 04/09/15 0915  BP: 143/80  137/84 115/57  Pulse: 121  91 117  Temp:  97.7 F (36.5 C)    TempSrc:  Oral    Resp: 24  23 26   Height:      Weight:      SpO2: 83%  94% 96%    Intake/Output Summary (Last 24 hours) at 04/09/15 0956 Last data filed at 04/09/15 0900  Gross per 24 hour  Intake  933.5 ml  Output    900 ml  Net   33.5 ml   Filed Weights   04/07/15 0500 04/08/15 0615 04/09/15 0745  Weight: 195 lb 1.7 oz (88.5 kg) 209 lb (94.802 kg) 209 lb 6.4 oz (94.983 kg)    Subjective Feels well. No chest pain or dyspnea. Not much post op pain  . acetaminophen  1,000 mg Oral 4 times per day   Or  . acetaminophen (TYLENOL) oral liquid 160 mg/5 mL  1,000 mg Per Tube 4 times per day  . amiodarone  400 mg Oral Q12H   Followed by  . [START ON 04/16/2015] amiodarone  400 mg Oral Daily  . antiseptic oral rinse  7 mL Mouth Rinse BID  . aspirin EC  325 mg Oral Daily   Or  . aspirin  324 mg Per Tube Daily  . atorvastatin  80 mg Oral q1800  . bisacodyl  10 mg Oral Daily   Or  . bisacodyl  10 mg Rectal Daily  . cholecalciferol  5,000 Units Oral Daily  . clopidogrel  75 mg Oral Daily  . docusate sodium  200 mg Oral Daily  . furosemide  40 mg Intravenous Daily  . insulin aspart  0-24 Units Subcutaneous 6 times per day  . insulin detemir  10 Units Subcutaneous Daily  . levothyroxine  25 mcg Oral QAC breakfast  . metoprolol tartrate  12.5 mg Oral BID   Or  . metoprolol tartrate  12.5 mg Per Tube BID  . pantoprazole  40 mg Oral Daily  . sodium chloride flush  3 mL Intravenous Q12H   . sodium chloride 10 mL/hr at 04/08/15 1400  . amiodarone 30 mg/hr (04/09/15 0900)  . milrinone 0.25 mcg/kg/min (04/09/15 0900)    TELEMETRY: Reviewed telemetry pt in NSR, rare PVC: LABS: Basic Metabolic Panel:  Recent Labs   04/08/15 1511 04/08/15 1822 04/09/15 0330  NA  --  135 137  K 6.1* 5.2* 5.0  CL  --  101 100*  CO2  --  23 27  GLUCOSE  --  178* 126*  BUN  --  17 25*  CREATININE  --  1.31* 1.70*  CALCIUM  --  7.9* 8.3*  MG 2.3  --  2.3   Liver Function Tests:  Recent Labs  04/08/15 1511  AST 29  ALT 27  ALKPHOS 40  BILITOT 0.8  PROT 4.8*  ALBUMIN 3.0*   No results for input(s): LIPASE, AMYLASE in the last 72 hours. CBC:  Recent Labs  04/08/15 0430 04/09/15 0330  WBC 17.0* 16.1*  HGB 8.8* 8.2*  HCT 27.9* 25.9*  MCV 86.9 86.9  PLT 166 175   Cardiac Enzymes: No results for input(s): CKTOTAL, CKMB, CKMBINDEX, TROPONINI in the last 72 hours. BNP: No results for input(s): PROBNP in the last  72 hours. D-Dimer: No results for input(s): DDIMER in the last 72 hours. Hemoglobin A1C: No results for input(s): HGBA1C in the last 72 hours. Fasting Lipid Panel: No results for input(s): CHOL, HDL, LDLCALC, TRIG, CHOLHDL, LDLDIRECT in the last 72 hours. Thyroid Function Tests:  Recent Labs  04/08/15 1511  TSH 11.050*     Radiology/Studies:  Dg Chest Port 1 View  04/09/2015  CLINICAL DATA:  Postop day 3 CABG. Followup basilar atelectasis and effusions. EXAM: PORTABLE CHEST 1 VIEW COMPARISON:  04/08/2015 and earlier. FINDINGS: Sternotomy for CABG. Cardiac silhouette markedly enlarged, unchanged. Mild pulmonary venous hypertension without overt edema, unchanged. Suboptimal inspiration with bibasilar atelectasis, unchanged. Small bilateral pleural effusions, right greater than left, unchanged. No new pulmonary parenchymal abnormalities. Right arm PICC tip projects at the cavoatrial junction. IMPRESSION: 1. Right arm PICC tip projects over the cavoatrial junction. 2. Suboptimal inspiration with bibasilar atelectasis, unchanged. 3. Stable small bilateral pleural effusions. 4. No new abnormalities. Electronically Signed   By: Evangeline Dakin M.D.   On: 04/09/2015 09:46   Dg Chest Port 1  View  04/08/2015  CLINICAL DATA:  Status post coronary bypass graft. EXAM: PORTABLE CHEST 1 VIEW COMPARISON:  April 07, 2015. FINDINGS: Stable cardiomediastinal silhouette. Status post coronary artery bypass graft. Right internal jugular catheter has been removed with sheath left in place. Left-sided chest tube is unchanged without pneumothorax. Mild bibasilar atelectasis is noted. IMPRESSION: Left-sided chest tube is unchanged without evidence of pneumothorax. Stable bibasilar opacities. Electronically Signed   By: Marijo Conception, M.D.   On: 04/08/2015 07:30    PHYSICAL EXAM General: Well developed, well nourished, in no acute distress. Head: Normal. Right IJ in place.  Neck: Negative for carotid bruits. JVD not elevated. No adenopathy Lungs: Clear anteriorly Heart: RRR S1 S2 without murmurs, rubs, or gallops. MTs in place. Abdomen: Soft, non-tender, non-distended with normoactive bowel sounds. No hepatomegaly. No rebound/guarding. No obvious abdominal masses. Extremities: mild edema.  Distal pedal pulses are 2+ and equal bilaterally. Neuro: Alert and oriented X 3. Moves all extremities spontaneously. Psych:  Responds to questions appropriately with a normal affect.  ASSESSMENT AND PLAN: 1. CAD s/p inferior MI in Dec 2016 with DES to distal RCA. Large thrombus burden. Residual severe LAD disease. S/p CABG x 4 . Hemodynamics look very good. weaning pressors today. Continue ASA. Consider resuming Plavix prior to DC.  2. Chronic systolic CHF. EF 30-35%. Normal PA pressures. Diuresis as needed per CT surgery. 3. Hyperlipidemia on statin. 4. Paroxysmal Atrial fib:  Is back in a-fib this am  On amiodarone drip  5. HTN:   BP is stable     Ralonda Tartt, Wonda Cheng, MD  04/09/2015 10:01 AM    [address],  [place], [place]  [address] Pager [phone] Phone: [phone]; Fax: [phone]   [facility]  [address] [place], [place]  [address] [phone]   Fax [phone]

## 2015-04-09 NOTE — Progress Notes (Signed)
3 Days Post-Op Procedure(s) (LRB): CORONARY ARTERY BYPASS GRAFT TIMES FOUR  USING LEFT INTERNAL MAMMARY ARTERY TO THE LEFT ANTERIOR DESCENDING, BILATERAL GREATER SAPHENOUS ENDOVEIN HARVEST GRAFT TO PROXIMAL AND MID POSTERIOR DESCENDING AND DIAGONAL CORONARY ARTERIES AND PLACEMENT OR RIGHT FEMORAL A-LINE.  (N/A) TRANSESOPHAGEAL ECHOCARDIOGRAM (TEE) (N/A) Subjective: Status post CABG for ischemic cardiomyopathy Postoperative atrial fibrillation non-IV amiodarone Patient feeling stronger, able to ambulate  Objective: Vital signs in last 24 hours: Temp:  [97.4 F (36.3 C)-98.9 F (37.2 C)] 97.7 F (36.5 C) (04/08 0850) Pulse Rate:  [41-121] 117 (04/08 0915) Cardiac Rhythm:  [-] Atrial fibrillation (04/08 0915) Resp:  [8-27] 26 (04/08 0915) BP: (79-143)/(47-103) 115/57 mmHg (04/08 0915) SpO2:  [83 %-100 %] 96 % (04/08 0915) Weight:  [209 lb 6.4 oz (94.983 kg)] 209 lb 6.4 oz (94.983 kg) (04/08 0745)  Hemodynamic parameters for last 24 hours:    Intake/Output from previous day: 04/07 0701 - 04/08 0700 In: 1099.3 [P.O.:420; I.V.:629.3; IV Piggyback:50] Out: 900 [Urine:820; Chest Tube:80] Intake/Output this shift: Total I/O In: 230 [I.V.:230] Out: -   Alert and oriented Breath sounds clear Abdomen soft Extremities with 2+ edema Neuro intact  Lab Results:  Recent Labs  04/08/15 0430 04/09/15 0330  WBC 17.0* 16.1*  HGB 8.8* 8.2*  HCT 27.9* 25.9*  PLT 166 175   BMET:  Recent Labs  04/08/15 1822 04/09/15 0330  NA 135 137  K 5.2* 5.0  CL 101 100*  CO2 23 27  GLUCOSE 178* 126*  BUN 17 25*  CREATININE 1.31* 1.70*  CALCIUM 7.9* 8.3*    PT/INR:  Recent Labs  04/06/15 1515  LABPROT 19.4*  INR 1.64*   ABG    Component Value Date/Time   PHART 7.407 04/09/2015 0928   HCO3 27.1* 04/09/2015 0928   TCO2 28.5 04/09/2015 0928   ACIDBASEDEF 5.0* 04/07/2015 0040   O2SAT 91.1 04/09/2015 0928   CBG (last 3)   Recent Labs  04/08/15 2321 04/09/15 0357  04/09/15 0847  GLUCAP 131* 113* 125*    Assessment/Plan: S/P Procedure(s) (LRB): CORONARY ARTERY BYPASS GRAFT TIMES FOUR  USING LEFT INTERNAL MAMMARY ARTERY TO THE LEFT ANTERIOR DESCENDING, BILATERAL GREATER SAPHENOUS ENDOVEIN HARVEST GRAFT TO PROXIMAL AND MID POSTERIOR DESCENDING AND DIAGONAL CORONARY ARTERIES AND PLACEMENT OR RIGHT FEMORAL A-LINE.  (N/A) TRANSESOPHAGEAL ECHOCARDIOGRAM (TEE) (N/A) Platelet count improved, will resume Lasix for preoperative PCI BUN, creatinine rising, co-OX less than 50%, we'll start low-dose milrinone   LOS: 8 days    Tharon Aquas Trigt III 04/09/2015

## 2015-04-09 NOTE — Progress Notes (Signed)
CT surgery p.m. Rounds  Patient had a good day Back in sinus rhythm on IV amiodarone with extra 150 mg bolus Co-ox improved on milrinone Urine output improved and creatinine improved on p.m. Labs Looks good on exam Continue current care

## 2015-04-10 ENCOUNTER — Inpatient Hospital Stay (HOSPITAL_COMMUNITY): Payer: 59

## 2015-04-10 LAB — COMPREHENSIVE METABOLIC PANEL
ALT: 232 U/L — ABNORMAL HIGH (ref 17–63)
AST: 143 U/L — ABNORMAL HIGH (ref 15–41)
Albumin: 2.5 g/dL — ABNORMAL LOW (ref 3.5–5.0)
Alkaline Phosphatase: 65 U/L (ref 38–126)
Anion gap: 9 (ref 5–15)
BUN: 34 mg/dL — ABNORMAL HIGH (ref 6–20)
CO2: 28 mmol/L (ref 22–32)
Calcium: 8.2 mg/dL — ABNORMAL LOW (ref 8.9–10.3)
Chloride: 98 mmol/L — ABNORMAL LOW (ref 101–111)
Creatinine, Ser: 1.61 mg/dL — ABNORMAL HIGH (ref 0.61–1.24)
GFR calc Af Amer: 49 mL/min — ABNORMAL LOW (ref >60)
GFR calc non Af Amer: 42 mL/min — ABNORMAL LOW (ref >60)
Glucose, Bld: 114 mg/dL — ABNORMAL HIGH (ref 65–99)
Potassium: 4.1 mmol/L (ref 3.5–5.1)
Sodium: 135 mmol/L (ref 135–145)
Total Bilirubin: 0.6 mg/dL (ref 0.3–1.2)
Total Protein: 4.5 g/dL — ABNORMAL LOW (ref 6.5–8.1)

## 2015-04-10 LAB — POCT I-STAT, CHEM 8
BUN: 32 mg/dL — ABNORMAL HIGH (ref 6–20)
Calcium, Ion: 1.13 mmol/L (ref 1.13–1.30)
Chloride: 95 mmol/L — ABNORMAL LOW (ref 101–111)
Creatinine, Ser: 1.5 mg/dL — ABNORMAL HIGH (ref 0.61–1.24)
Glucose, Bld: 106 mg/dL — ABNORMAL HIGH (ref 65–99)
HEMATOCRIT: 27 % — AB (ref 39.0–52.0)
HEMOGLOBIN: 9.2 g/dL — AB (ref 13.0–17.0)
Potassium: 3.4 mmol/L — ABNORMAL LOW (ref 3.5–5.1)
SODIUM: 137 mmol/L (ref 135–145)
TCO2: 30 mmol/L (ref 0–100)

## 2015-04-10 LAB — GLUCOSE, CAPILLARY
GLUCOSE-CAPILLARY: 108 mg/dL — AB (ref 65–99)
GLUCOSE-CAPILLARY: 104 mg/dL — AB (ref 65–99)
Glucose-Capillary: 103 mg/dL — ABNORMAL HIGH (ref 65–99)
Glucose-Capillary: 94 mg/dL (ref 65–99)
Glucose-Capillary: 142 mg/dL — ABNORMAL HIGH (ref 65–99)
Glucose-Capillary: 104 mg/dL — ABNORMAL HIGH (ref 65–99)

## 2015-04-10 LAB — CARBOXYHEMOGLOBIN
Carboxyhemoglobin: 1.1 % (ref 0.5–1.5)
Methemoglobin: 0.7 % (ref 0.0–1.5)
O2 Saturation: 42.8 %
Total hemoglobin: 7.4 g/dL — ABNORMAL LOW (ref 13.5–18.0)

## 2015-04-10 LAB — CBC
HCT: 21.8 % — ABNORMAL LOW (ref 39.0–52.0)
Hemoglobin: 7.2 g/dL — ABNORMAL LOW (ref 13.0–17.0)
MCH: 28.1 pg (ref 26.0–34.0)
MCHC: 33 g/dL (ref 30.0–36.0)
MCV: 85.2 fL (ref 78.0–100.0)
Platelets: 232 10*3/uL (ref 150–400)
RBC: 2.56 MIL/uL — ABNORMAL LOW (ref 4.22–5.81)
RDW: 16.4 % — ABNORMAL HIGH (ref 11.5–15.5)
WBC: 17.1 10*3/uL — ABNORMAL HIGH (ref 4.0–10.5)

## 2015-04-10 LAB — PREPARE RBC (CROSSMATCH)

## 2015-04-10 MED ORDER — POTASSIUM CHLORIDE 10 MEQ/50ML IV SOLN
10.0000 meq | INTRAVENOUS | Status: AC
  Administered 2015-04-10 (×2): 10 meq via INTRAVENOUS
  Filled 2015-04-10 (×2): qty 50

## 2015-04-10 MED ORDER — FE FUMARATE-B12-VIT C-FA-IFC PO CAPS
1.0000 | ORAL_CAPSULE | Freq: Three times a day (TID) | ORAL | Status: DC
  Administered 2015-04-10 – 2015-04-13 (×10): 1 via ORAL
  Filled 2015-04-10 (×10): qty 1

## 2015-04-10 MED ORDER — INSULIN ASPART 100 UNIT/ML ~~LOC~~ SOLN: 0.0000 [IU] | Freq: Every day | SUBCUTANEOUS | Status: DC

## 2015-04-10 MED ORDER — FUROSEMIDE 10 MG/ML IJ SOLN
40.0000 mg | Freq: Every day | INTRAMUSCULAR | Status: DC
  Administered 2015-04-10: 40 mg via INTRAVENOUS
  Filled 2015-04-10: qty 4

## 2015-04-10 MED ORDER — INSULIN ASPART 100 UNIT/ML ~~LOC~~ SOLN
0.0000 [IU] | Freq: Three times a day (TID) | SUBCUTANEOUS | Status: DC
  Administered 2015-04-10: 2 [IU] via SUBCUTANEOUS

## 2015-04-10 MED ORDER — AMIODARONE IV BOLUS ONLY 150 MG/100ML
150.0000 mg | Freq: Once | INTRAVENOUS | Status: AC
  Administered 2015-04-10: 150 mg via INTRAVENOUS
  Filled 2015-04-10: qty 100

## 2015-04-10 MED ORDER — AMIODARONE HCL 200 MG PO TABS
400.0000 mg | ORAL_TABLET | Freq: Two times a day (BID) | ORAL | Status: DC
  Administered 2015-04-10 – 2015-04-13 (×7): 400 mg via ORAL
  Filled 2015-04-10 (×7): qty 2

## 2015-04-10 NOTE — Progress Notes (Signed)
4 Days Post-Op Procedure(s) (LRB): CORONARY ARTERY BYPASS GRAFT TIMES FOUR  USING LEFT INTERNAL MAMMARY ARTERY TO THE LEFT ANTERIOR DESCENDING, BILATERAL GREATER SAPHENOUS ENDOVEIN HARVEST GRAFT TO PROXIMAL AND MID POSTERIOR DESCENDING AND DIAGONAL CORONARY ARTERIES AND PLACEMENT OR RIGHT FEMORAL A-LINE.  (N/A) TRANSESOPHAGEAL ECHOCARDIOGRAM (TEE) (N/A) Subjective: CABG for ischemic cardiomyopathy-patient placed on milrinone yesterday for  low mixed venous saturation which improved with milrinone. Co-ox this a.m. is low due to anemia  Postoperative atrial fibrillation on sinus rhythm on amiodarone-transition to by mouth dosing Postoperative expected blood loss anemia-hemoglobin 7.2 and we'll transfuse 2 units today Postoperative rising creatinine-now stable at 1.7  Chest x-ray today clear No complaints  Objective: Vital signs in last 24 hours: Temp:  [97.4 F (36.3 C)-98.5 F (36.9 C)] 98.2 F (36.8 C) (04/09 0728) Pulse Rate:  [25-117] 65 (04/09 0700) Cardiac Rhythm:  [-] Normal sinus rhythm (04/09 0800) Resp:  [15-30] 18 (04/09 0700) BP: (91-137)/(49-84) 102/60 mmHg (04/09 0700) SpO2:  [91 %-100 %] 100 % (04/09 0700) Weight:  [211 lb 13.8 oz (96.1 kg)] 211 lb 13.8 oz (96.1 kg) (04/09 0600)  Hemodynamic parameters for last 24 hours:  Maintaining sinus rhythm  Intake/Output from previous day: 04/08 0701 - 04/09 0700 In: 973.6 [I.V.:973.6] Out: 1230 [Urine:1230] Intake/Output this shift: Total I/O In: 33.8 [I.V.:33.8] Out: 25 [Urine:25]  Alert and comfortable Lungs clear Abdomen soft Mild peripheral edema  Lab Results:  Recent Labs  04/09/15 0330 04/09/15 1714 04/10/15 0420  WBC 16.1*  --  17.1*  HGB 8.2* 7.5* 7.2*  HCT 25.9* 22.0* 21.8*  PLT 175  --  232   BMET:  Recent Labs  04/09/15 0330 04/09/15 1714 04/10/15 0420  NA 137 134* 135  K 5.0 3.8 4.1  CL 100* 94* 98*  CO2 27  --  28  GLUCOSE 126* 119* 114*  BUN 25* 29* 34*  CREATININE 1.70* 1.50* 1.61*   CALCIUM 8.3*  --  8.2*    PT/INR: No results for input(s): LABPROT, INR in the last 72 hours. ABG    Component Value Date/Time   PHART 7.407 04/09/2015 0928   HCO3 27.1* 04/09/2015 0928   TCO2 29 04/09/2015 1714   ACIDBASEDEF 5.0* 04/07/2015 0040   O2SAT 42.8 04/10/2015 0410   CBG (last 3)   Recent Labs  04/09/15 2026 04/09/15 2320 04/10/15 0407  GLUCAP 107* 103* 108*    Assessment/Plan: S/P Procedure(s) (LRB): CORONARY ARTERY BYPASS GRAFT TIMES FOUR  USING LEFT INTERNAL MAMMARY ARTERY TO THE LEFT ANTERIOR DESCENDING, BILATERAL GREATER SAPHENOUS ENDOVEIN HARVEST GRAFT TO PROXIMAL AND MID POSTERIOR DESCENDING AND DIAGONAL CORONARY ARTERIES AND PLACEMENT OR RIGHT FEMORAL A-LINE.  (N/A) TRANSESOPHAGEAL ECHOCARDIOGRAM (TEE) (N/A) Transfuse 2 units packed cells for expected postop blood loss anemia  Continue milrinone Keep in ICU   LOS: 9 days    Tharon Aquas Trigt III 04/10/2015

## 2015-04-10 NOTE — Progress Notes (Signed)
CT surgery p.m. Rounds  Patient received 2 units packed cells He diuresed.2 L after Lasix between the transfusion units He developed atrial fibrillation this p.m. after ambulation 300 feet Additional IV dose 150 mg amiodarone administered P.m. labs show improvement creatinine 1.5, hemoglobin 9.2

## 2015-04-10 NOTE — Progress Notes (Signed)
Keep foley in due to increase creatine and aggressive iv diuresis

## 2015-04-10 NOTE — Progress Notes (Signed)
Dr Lucianne Lei trigt rounding on floor made aware of pt not tolerating dopamine infusion. 15 mins after starting dopamine pt HR increased to 120s, sats decreased to 80s, 4L [place] applied at this time, SBP increased to 140s, and pt became very nauseous. MD ordered to wean dopamine off and change to milirone. Will continue to monitor and assess pt

## 2015-04-11 ENCOUNTER — Encounter (HOSPITAL_COMMUNITY): Payer: 59

## 2015-04-11 ENCOUNTER — Inpatient Hospital Stay (HOSPITAL_COMMUNITY): Payer: 59

## 2015-04-11 ENCOUNTER — Ambulatory Visit (HOSPITAL_COMMUNITY): Payer: Medicare Other

## 2015-04-11 LAB — GLUCOSE, CAPILLARY
GLUCOSE-CAPILLARY: 75 mg/dL (ref 65–99)
Glucose-Capillary: 105 mg/dL — ABNORMAL HIGH (ref 65–99)
Glucose-Capillary: 131 mg/dL — ABNORMAL HIGH (ref 65–99)
Glucose-Capillary: 86 mg/dL (ref 65–99)
Glucose-Capillary: 95 mg/dL (ref 65–99)

## 2015-04-11 LAB — TYPE AND SCREEN
ABO/RH(D): A NEG
Antibody Screen: NEGATIVE
Unit division: 0
Unit division: 0
Unit division: 0

## 2015-04-11 LAB — COMPREHENSIVE METABOLIC PANEL
ALT: 191 U/L — ABNORMAL HIGH (ref 17–63)
AST: 95 U/L — ABNORMAL HIGH (ref 15–41)
Albumin: 2.5 g/dL — ABNORMAL LOW (ref 3.5–5.0)
Alkaline Phosphatase: 72 U/L (ref 38–126)
Anion gap: 11 (ref 5–15)
BUN: 35 mg/dL — ABNORMAL HIGH (ref 6–20)
CO2: 28 mmol/L (ref 22–32)
Calcium: 8.1 mg/dL — ABNORMAL LOW (ref 8.9–10.3)
Chloride: 98 mmol/L — ABNORMAL LOW (ref 101–111)
Creatinine, Ser: 1.55 mg/dL — ABNORMAL HIGH (ref 0.61–1.24)
GFR calc Af Amer: 51 mL/min — ABNORMAL LOW (ref >60)
GFR calc non Af Amer: 44 mL/min — ABNORMAL LOW (ref >60)
Glucose, Bld: 87 mg/dL (ref 65–99)
Potassium: 3.8 mmol/L (ref 3.5–5.1)
Sodium: 137 mmol/L (ref 135–145)
Total Bilirubin: 1.4 mg/dL — ABNORMAL HIGH (ref 0.3–1.2)
Total Protein: 4.7 g/dL — ABNORMAL LOW (ref 6.5–8.1)

## 2015-04-11 LAB — CBC
HCT: 25.3 % — ABNORMAL LOW (ref 39.0–52.0)
Hemoglobin: 8.7 g/dL — ABNORMAL LOW (ref 13.0–17.0)
MCH: 29 pg (ref 26.0–34.0)
MCHC: 34.4 g/dL (ref 30.0–36.0)
MCV: 84.3 fL (ref 78.0–100.0)
Platelets: 230 10*3/uL (ref 150–400)
RBC: 3 MIL/uL — ABNORMAL LOW (ref 4.22–5.81)
RDW: 15.8 % — ABNORMAL HIGH (ref 11.5–15.5)
WBC: 12.3 10*3/uL — ABNORMAL HIGH (ref 4.0–10.5)

## 2015-04-11 LAB — CARBOXYHEMOGLOBIN
Carboxyhemoglobin: 1.5 % (ref 0.5–1.5)
Methemoglobin: 0.6 % (ref 0.0–1.5)
O2 Saturation: 63.1 %
Total hemoglobin: 8.8 g/dL — ABNORMAL LOW (ref 13.5–18.0)

## 2015-04-11 MED ORDER — BISACODYL 5 MG PO TBEC: 10.0000 mg | DELAYED_RELEASE_TABLET | Freq: Every day | ORAL | Status: DC | PRN

## 2015-04-11 MED ORDER — SODIUM CHLORIDE 0.9 % IV SOLN: 250.0000 mL | INTRAVENOUS | Status: DC | PRN

## 2015-04-11 MED ORDER — SODIUM CHLORIDE 0.9% FLUSH
3.0000 mL | Freq: Two times a day (BID) | INTRAVENOUS | Status: DC
  Administered 2015-04-12: 3 mL via INTRAVENOUS

## 2015-04-11 MED ORDER — PANTOPRAZOLE SODIUM 40 MG PO TBEC
40.0000 mg | DELAYED_RELEASE_TABLET | Freq: Every day | ORAL | Status: DC
  Administered 2015-04-11 – 2015-04-13 (×3): 40 mg via ORAL
  Filled 2015-04-11 (×3): qty 1

## 2015-04-11 MED ORDER — ONDANSETRON HCL 4 MG/2ML IJ SOLN: 4.0000 mg | Freq: Four times a day (QID) | INTRAMUSCULAR | Status: DC | PRN

## 2015-04-11 MED ORDER — SODIUM CHLORIDE 0.9% FLUSH: 10.0000 mL | INTRAVENOUS | Status: DC | PRN

## 2015-04-11 MED ORDER — TRAMADOL HCL 50 MG PO TABS
50.0000 mg | ORAL_TABLET | ORAL | Status: DC | PRN
  Administered 2015-04-12: 50 mg via ORAL
  Filled 2015-04-11: qty 2

## 2015-04-11 MED ORDER — DOCUSATE SODIUM 100 MG PO CAPS
200.0000 mg | ORAL_CAPSULE | Freq: Every day | ORAL | Status: DC
  Administered 2015-04-11 – 2015-04-12 (×2): 200 mg via ORAL
  Filled 2015-04-11 (×3): qty 2

## 2015-04-11 MED ORDER — BISACODYL 10 MG RE SUPP: 10.0000 mg | Freq: Every day | RECTAL | Status: DC | PRN

## 2015-04-11 MED ORDER — SODIUM CHLORIDE 0.9% FLUSH: 3.0000 mL | INTRAVENOUS | Status: DC | PRN

## 2015-04-11 MED ORDER — MOVING RIGHT ALONG BOOK
Freq: Once | Status: AC
  Administered 2015-04-11: 08:00:00
  Filled 2015-04-11 (×2): qty 1

## 2015-04-11 MED ORDER — ENOXAPARIN SODIUM 30 MG/0.3ML ~~LOC~~ SOLN
30.0000 mg | SUBCUTANEOUS | Status: DC
  Administered 2015-04-11 – 2015-04-12 (×2): 30 mg via SUBCUTANEOUS
  Filled 2015-04-11 (×2): qty 0.3

## 2015-04-11 MED ORDER — ONDANSETRON HCL 4 MG PO TABS: 4.0000 mg | ORAL_TABLET | Freq: Four times a day (QID) | ORAL | Status: DC | PRN

## 2015-04-11 MED ORDER — INSULIN ASPART 100 UNIT/ML ~~LOC~~ SOLN
0.0000 [IU] | Freq: Three times a day (TID) | SUBCUTANEOUS | Status: DC
  Administered 2015-04-11: 2 [IU] via SUBCUTANEOUS

## 2015-04-11 MED ORDER — METOPROLOL TARTRATE 12.5 MG HALF TABLET
12.5000 mg | ORAL_TABLET | Freq: Two times a day (BID) | ORAL | Status: DC
  Administered 2015-04-11 – 2015-04-12 (×3): 12.5 mg via ORAL
  Filled 2015-04-11 (×4): qty 1

## 2015-04-11 NOTE — Progress Notes (Signed)
Patient ID: Ryan Ward, male   DOB: 1945-04-24, 70 y.o.   MRN: [id number] [facility] DAILY ICU PROGRESS NOTE                   [address]            [place] [address]          [phone]   5 Days Post-Op Procedure(s) (LRB): CORONARY ARTERY BYPASS GRAFT TIMES FOUR  USING LEFT INTERNAL MAMMARY ARTERY TO THE LEFT ANTERIOR DESCENDING, BILATERAL GREATER SAPHENOUS ENDOVEIN HARVEST GRAFT TO PROXIMAL AND MID POSTERIOR DESCENDING AND DIAGONAL CORONARY ARTERIES AND PLACEMENT OR RIGHT FEMORAL A-LINE.  (N/A) TRANSESOPHAGEAL ECHOCARDIOGRAM (TEE) (N/A)  Total Length of Stay:  LOS: 10 days   Subjective: Feels better today, ambulated around the unit   Objective: Vital signs in last 24 hours: Temp:  [97.6 F (36.4 C)-98.8 F (37.1 C)] 98.1 F (36.7 C) (04/10 0719) Pulse Rate:  [52-93] 69 (04/10 0700) Cardiac Rhythm:  [-] Normal sinus rhythm (04/09 2200) Resp:  [0-26] 19 (04/10 0700) BP: (99-147)/(55-97) 111/66 mmHg (04/10 0700) SpO2:  [92 %-100 %] 95 % (04/10 0700) Weight:  [212 lb 1.3 oz (96.2 kg)] 212 lb 1.3 oz (96.2 kg) (04/10 0600)  Filed Weights   04/09/15 0745 04/10/15 0600 04/11/15 0600  Weight: 209 lb 6.4 oz (94.983 kg) 211 lb 13.8 oz (96.1 kg) 212 lb 1.3 oz (96.2 kg)    Weight change: 2 lb 10.9 oz (1.217 kg)   Hemodynamic parameters for last 24 hours:    Intake/Output from previous day: 04/09 0701 - 04/10 0700 In: 1637.1 [P.O.:400; I.V.:568.1; Blood:619; IV Piggyback:50] Out: 2225 [Urine:2225]  Intake/Output this shift:    Current Meds: Scheduled Meds: . acetaminophen  1,000 mg Oral 4 times per day   Or  . acetaminophen (TYLENOL) oral liquid 160 mg/5 mL  1,000 mg Per Tube 4 times per day  . amiodarone  400 mg Oral BID  . antiseptic oral rinse  7 mL Mouth Rinse BID  . aspirin EC  325 mg Oral Daily   Or  . aspirin  324 mg Per Tube Daily  . atorvastatin  80 mg Oral q1800  . bisacodyl  10 mg Oral Daily   Or  . bisacodyl  10 mg Rectal Daily  .  cholecalciferol  5,000 Units Oral Daily  . clopidogrel  75 mg Oral Daily  . docusate sodium  200 mg Oral Daily  . ferrous [id number] C-folic acid  1 capsule Oral TID PC  . furosemide  40 mg Intravenous Daily  . insulin aspart  0-15 Units Subcutaneous TID WC  . insulin aspart  0-5 Units Subcutaneous QHS  . insulin detemir  10 Units Subcutaneous Daily  . levothyroxine  25 mcg Oral QAC breakfast  . metoprolol tartrate  12.5 mg Oral BID   Or  . metoprolol tartrate  12.5 mg Per Tube BID  . pantoprazole  40 mg Oral Daily  . sodium chloride flush  3 mL Intravenous Q12H   Continuous Infusions: . sodium chloride 10 mL/hr at 04/11/15 0600  . milrinone 0.126 mcg/kg/min (04/11/15 0600)   PRN Meds:.ALPRAZolam, metoprolol, ondansetron (ZOFRAN) IV, oxyCODONE, sodium chloride flush, sodium chloride flush, traMADol  General appearance: alert and cooperative Neurologic: intact Heart: regular rate and rhythm, S1, S2 normal, no murmur, click, rub or gallop Lungs: clear to auscultation bilaterally Abdomen: soft, non-tender; bowel sounds normal; no masses,  no organomegaly Extremities: extremities normal, atraumatic, no cyanosis or edema and  Homans sign is negative, no sign of DVT Wound: sternum stable  Lab Results: CBC: Recent Labs  04/10/15 0420 04/10/15 1735 04/11/15 0423  WBC 17.1*  --  12.3*  HGB 7.2* 9.2* 8.7*  HCT 21.8* 27.0* 25.3*  PLT 232  --  230   BMET:  Recent Labs  04/10/15 0420 04/10/15 1735 04/11/15 0423  NA 135 137 137  K 4.1 3.4* 3.8  CL 98* 95* 98*  CO2 28  --  28  GLUCOSE 114* 106* 87  BUN 34* 32* 35*  CREATININE 1.61* 1.50* 1.55*  CALCIUM 8.2*  --  8.1*    PT/INR: No results for input(s): LABPROT, INR in the last 72 hours. Radiology: Dg Chest Port 1 View  04/11/2015  CLINICAL DATA:  CABG. EXAM: PORTABLE CHEST 1 VIEW COMPARISON:  04/10/2015. FINDINGS: PICC line stable position. Prior CABG. Cardiomegaly with normal pulmonary vascularity. Low lung  volumes with bibasilar atelectasis and/or infiltrates, slightly cleared from prior exam. No pleural effusion or pneumothorax. IMPRESSION: 1. Right PICC line in stable position. Prior CABG. Stable cardiomegaly . 2. Persistent but slightly improving bibasilar infiltrates/edema and basilar atelectasis. Electronically Signed   By: Marcello Moores  Register   On: 04/11/2015 07:26     Assessment/Plan: S/P Procedure(s) (LRB): CORONARY ARTERY BYPASS GRAFT TIMES FOUR  USING LEFT INTERNAL MAMMARY ARTERY TO THE LEFT ANTERIOR DESCENDING, BILATERAL GREATER SAPHENOUS ENDOVEIN HARVEST GRAFT TO PROXIMAL AND MID POSTERIOR DESCENDING AND DIAGONAL CORONARY ARTERIES AND PLACEMENT OR RIGHT FEMORAL A-LINE.  (N/A) TRANSESOPHAGEAL ECHOCARDIOGRAM (TEE) (N/A) Mobilize Diuresis Plan for transfer to step-down: see transfer orders     [name] 04/11/2015 7:41 AM

## 2015-04-11 NOTE — Care Management Important Message (Signed)
Important Message  Patient Details  Name: Ryan Ward MRN: [id number] Date of Birth: 1945-05-28   Medicare Important Message Given:  Yes    Nathen May 04/11/2015, 12:32 PM

## 2015-04-11 NOTE — Progress Notes (Signed)
Pt received from 2S.  CCMD and dietary notified.  Report consistent with that received from 2S RN.  Pt has no complaints of pain or distress at this time.  Incisions clean and dry, no drainage noted.  IV fluids verified.

## 2015-04-11 NOTE — Progress Notes (Signed)
CARDIAC REHAB PHASE I   PRE:  Rate/Rhythm: 63 SR  BP:  Supine: 122/75  Sitting:   Standing:    SaO2: 97%RA  MODE:  Ambulation: 150 ft   POST:  Rate/Rhythm: 69 SR  BP:  Supine: 126/75  Sitting:   Standing:    SaO2: 96%RA 1411-1440 Pt walked 150 ft on RA with rolling walker and asst x 1 with steady gait. Did not feel that he could go farther. Back to bed with call bell. Tolerated well.   Graylon Good, RN BSN  04/11/2015 2:36 PM

## 2015-04-12 ENCOUNTER — Inpatient Hospital Stay (HOSPITAL_COMMUNITY): Payer: 59

## 2015-04-12 ENCOUNTER — Other Ambulatory Visit: Payer: Self-pay

## 2015-04-12 LAB — BASIC METABOLIC PANEL
Anion gap: 10 (ref 5–15)
BUN: 33 mg/dL — ABNORMAL HIGH (ref 6–20)
CO2: 27 mmol/L (ref 22–32)
Calcium: 8.6 mg/dL — ABNORMAL LOW (ref 8.9–10.3)
Chloride: 101 mmol/L (ref 101–111)
Creatinine, Ser: 1.23 mg/dL (ref 0.61–1.24)
GFR calc Af Amer: >60 mL/min (ref >60)
GFR calc non Af Amer: 58 mL/min — ABNORMAL LOW (ref >60)
Glucose, Bld: 91 mg/dL (ref 65–99)
Potassium: 3.6 mmol/L (ref 3.5–5.1)
Sodium: 138 mmol/L (ref 135–145)

## 2015-04-12 LAB — BLOOD GAS, ARTERIAL
Acid-Base Excess: 2.8 mmol/L — ABNORMAL HIGH (ref 0.0–2.0)
Bicarbonate: 27.1 mEq/L — ABNORMAL HIGH (ref 20.0–24.0)
Drawn by: 235881
O2 Content: 4 L/min
O2 Saturation: 91.1 %
Patient temperature: 98.6
TCO2: 28.5 mmol/L (ref 0–100)
pCO2 arterial: 43.9 mmHg (ref 35.0–45.0)
pH, Arterial: 7.407 (ref 7.350–7.450)
pO2, Arterial: 65.8 mmHg — ABNORMAL LOW (ref 80.0–100.0)

## 2015-04-12 LAB — CBC
HCT: 27.1 % — ABNORMAL LOW (ref 39.0–52.0)
Hemoglobin: 9 g/dL — ABNORMAL LOW (ref 13.0–17.0)
MCH: 28.2 pg (ref 26.0–34.0)
MCHC: 33.2 g/dL (ref 30.0–36.0)
MCV: 85 fL (ref 78.0–100.0)
Platelets: 276 10*3/uL (ref 150–400)
RBC: 3.19 MIL/uL — ABNORMAL LOW (ref 4.22–5.81)
RDW: 16.3 % — ABNORMAL HIGH (ref 11.5–15.5)
WBC: 11.2 10*3/uL — ABNORMAL HIGH (ref 4.0–10.5)

## 2015-04-12 LAB — GLUCOSE, CAPILLARY
GLUCOSE-CAPILLARY: 86 mg/dL (ref 65–99)
Glucose-Capillary: 92 mg/dL (ref 65–99)
Glucose-Capillary: 89 mg/dL (ref 65–99)
Glucose-Capillary: 104 mg/dL — ABNORMAL HIGH (ref 65–99)

## 2015-04-12 NOTE — Progress Notes (Signed)
Pacing wires removed. Pt educated on the importance of bed rest and frequent vital signs. Pt is agreeable. Vital signs taken. Steri strips placed on site. Will continue to monitor.

## 2015-04-12 NOTE — Progress Notes (Signed)
SUBJECTIVE:  No complaints.  Wants to go home  OBJECTIVE:   Vitals:   Filed Vitals:   04/11/15 1200 04/11/15 1229 04/11/15 2041 04/12/15 0450  BP: 105/69 118/69 121/68 120/58  Pulse: 60 60 68 62  Temp:  98.3 F (36.8 C) 98.2 F (36.8 C) 97.2 F (36.2 C)  TempSrc:  Oral Oral Oral  Resp: 19 18 18    Height:      Weight:    212 lb 8 oz (96.389 kg)  SpO2: 100% 100% 96% 97%   I&O's:   Intake/Output Summary (Last 24 hours) at 04/12/15 0905 Last data filed at 04/12/15 0700  Gross per 24 hour  Intake  640.8 ml  Output    350 ml  Net  290.8 ml   TELEMETRY: Reviewed telemetry pt in NSR:     PHYSICAL EXAM General: Well developed, well nourished, in no acute distress Head: Eyes PERRLA, No xanthomas.   Normal cephalic and atramatic  Lungs:   Clear bilaterally to auscultation and percussion. Heart:   HRRR S1 S2 Pulses are 2+ & equal. Abdomen: Bowel sounds are positive, abdomen soft and non-tender without masses  Extremities:   No clubbing, cyanosis or edema.  DP +1 Neuro: Alert and oriented X 3. Psych:  Good affect, responds appropriately   LABS: Basic Metabolic Panel:  Recent Labs  04/11/15 0423 04/12/15 0652  NA 137 138  K 3.8 3.6  CL 98* 101  CO2 28 27  GLUCOSE 87 91  BUN 35* 33*  CREATININE 1.55* 1.23  CALCIUM 8.1* 8.6*   Liver Function Tests:  Recent Labs  04/10/15 0420 04/11/15 0423  AST 143* 95*  ALT 232* 191*  ALKPHOS 65 72  BILITOT 0.6 1.4*  PROT 4.5* 4.7*  ALBUMIN 2.5* 2.5*   No results for input(s): LIPASE, AMYLASE in the last 72 hours. CBC:  Recent Labs  04/11/15 0423 04/12/15 0652  WBC 12.3* 11.2*  HGB 8.7* 9.0*  HCT 25.3* 27.1*  MCV 84.3 85.0  PLT 230 276   Cardiac Enzymes: No results for input(s): CKTOTAL, CKMB, CKMBINDEX, TROPONINI in the last 72 hours. BNP: Invalid input(s): POCBNP D-Dimer: No results for input(s): DDIMER in the last 72 hours. Hemoglobin A1C: No results for input(s): HGBA1C in the last 72 hours. Fasting  Lipid Panel: No results for input(s): CHOL, HDL, LDLCALC, TRIG, CHOLHDL, LDLDIRECT in the last 72 hours. Thyroid Function Tests: No results for input(s): TSH, T4TOTAL, T3FREE, THYROIDAB in the last 72 hours.  Invalid input(s): FREET3 Anemia Panel: No results for input(s): VITAMINB12, FOLATE, FERRITIN, TIBC, IRON, RETICCTPCT in the last 72 hours. Coag Panel:   Lab Results  Component Value Date   INR 1.64* 04/06/2015   INR 1.23 04/01/2015   INR 1.26 03/25/2015    RADIOLOGY: Dg Chest 2 View  04/12/2015  CLINICAL DATA:  Postop CABG. EXAM: CHEST  2 VIEW COMPARISON:  04/11/2015 FINDINGS: Right central line remains in place, unchanged. Prior CABG. Bibasilar airspace opacities, likely atelectasis. Small bilateral effusions. Mild cardiomegaly. No real change since prior study. No pneumothorax. No acute bony abnormality. IMPRESSION: Stable bibasilar opacities, likely atelectasis. Stable small effusions. Electronically Signed   By: Rolm Baptise M.D.   On: 04/12/2015 07:26   Dg Chest 2 View  04/05/2015  CLINICAL DATA:  Preoperative respiratory evaluation prior to CABG. Current history of asthma. STEMI 12/07/2014. Pulmonary emboli diagnosed 12/12/2014. EXAM: CHEST  2 VIEW COMPARISON:  CTA chest 12/12/2014.  Two-view chest x-ray 12/09/2014. FINDINGS: Cardiac silhouette moderately enlarged, unchanged.  Thoracic aorta minimally atherosclerotic, unchanged. Hilar and mediastinal contours otherwise unremarkable. Lungs clear. Bronchovascular markings normal. Pulmonary vascularity normal. No visible pleural effusions. No pneumothorax. Visualized bony thorax intact. IMPRESSION: Cardiomegaly.  No acute cardiopulmonary disease. Electronically Signed   By: Evangeline Dakin M.D.   On: 04/05/2015 21:06   Dg Chest Port 1 View  04/11/2015  CLINICAL DATA:  CABG. EXAM: PORTABLE CHEST 1 VIEW COMPARISON:  04/10/2015. FINDINGS: PICC line stable position. Prior CABG. Cardiomegaly with normal pulmonary vascularity. Low lung  volumes with bibasilar atelectasis and/or infiltrates, slightly cleared from prior exam. No pleural effusion or pneumothorax. IMPRESSION: 1. Right PICC line in stable position. Prior CABG. Stable cardiomegaly . 2. Persistent but slightly improving bibasilar infiltrates/edema and basilar atelectasis. Electronically Signed   By: Marcello Moores  Register   On: 04/11/2015 07:26   Dg Chest Port 1 View  04/10/2015  CLINICAL DATA:  Shortness of breath.  Coughing. EXAM: PORTABLE CHEST 1 VIEW COMPARISON:  04/09/2015 FINDINGS: PICC line tip in the lower SVC region. Stable hazy densities in the right lower chest. Stable densities at left lung base with obscuration of left hemidiaphragm. Again noted are prominent linear or interstitial densities in the left lower chest. Heart size remains upper limits of normal but stable. Post CABG changes in the chest. Negative for a pneumothorax. IMPRESSION: Persistent bibasilar chest densities. Right chest densities are likely associated with right pleural fluid. There appears to be focal consolidation in the left lower lung, retrocardiac space. Left chest atelectasis. Electronically Signed   By: Markus Daft M.D.   On: 04/10/2015 08:27   Dg Chest Port 1 View  04/09/2015  CLINICAL DATA:  Postop day 3 CABG. Followup basilar atelectasis and effusions. EXAM: PORTABLE CHEST 1 VIEW COMPARISON:  04/08/2015 and earlier. FINDINGS: Sternotomy for CABG. Cardiac silhouette markedly enlarged, unchanged. Mild pulmonary venous hypertension without overt edema, unchanged. Suboptimal inspiration with bibasilar atelectasis, unchanged. Small bilateral pleural effusions, right greater than left, unchanged. No new pulmonary parenchymal abnormalities. Right arm PICC tip projects at the cavoatrial junction. IMPRESSION: 1. Right arm PICC tip projects over the cavoatrial junction. 2. Suboptimal inspiration with bibasilar atelectasis, unchanged. 3. Stable small bilateral pleural effusions. 4. No new abnormalities.  Electronically Signed   By: Evangeline Dakin M.D.   On: 04/09/2015 09:46   Dg Chest Port 1 View  04/08/2015  CLINICAL DATA:  Status post coronary bypass graft. EXAM: PORTABLE CHEST 1 VIEW COMPARISON:  April 07, 2015. FINDINGS: Stable cardiomediastinal silhouette. Status post coronary artery bypass graft. Right internal jugular catheter has been removed with sheath left in place. Left-sided chest tube is unchanged without pneumothorax. Mild bibasilar atelectasis is noted. IMPRESSION: Left-sided chest tube is unchanged without evidence of pneumothorax. Stable bibasilar opacities. Electronically Signed   By: Marijo Conception, M.D.   On: 04/08/2015 07:30   Dg Chest Port 1 View  04/07/2015  CLINICAL DATA:  CABG. EXAM: PORTABLE CHEST 1 VIEW COMPARISON:  04/06/2015. FINDINGS: Interim extubation and removal of NG tube. Swan-Ganz catheter, mediastinal drainage catheter, left chest tube in stable position. Prior CABG. Cardiomegaly. Low lung volumes with basilar atelectasis and/or infiltrates. Findings have progressed from prior exam . No pleural effusion or pneumothorax. IMPRESSION: 1. Interim extubation and removal of NG tube. Swan-Ganz catheter, mediastinal drainage catheter, left chest tube in stable position. 2. Low lung volumes with mild bibasilar atelectasis and/or infiltrates. Findings have progressed from prior exam . 3. Prior CABG.  Cardiomegaly.  Normal pulmonary vascularity . Electronically Signed   By: Marcello Moores  Register  On: 04/07/2015 07:51   Dg Chest Port 1 View  04/06/2015  CLINICAL DATA:  70 year old male status post CABG. Initial encounter. EXAM: PORTABLE CHEST 1 VIEW COMPARISON:  04/05/2015 and earlier. FINDINGS: Portable AP view at 1534 hours. Intubated. Endotracheal tube tip at the level the clavicles. Enteric tube courses to the left upper quadrant, side hole to level of the gastric fundus. Right IJ approach Swan-Ganz catheter, tip at the level of the distal right pulmonary artery. Mediastinal and  left chest tubes in place. No pneumothorax or pulmonary edema. Linear perihilar probable atelectasis greater on the left. Stable cardiac size and mediastinal contours. Sequelae of CABG. No pleural effusion identified. Epicardial pacer wires also in place. IMPRESSION: 1. Lines and tubes appropriately placed. 2. No pneumothorax.  Mild atelectasis. Electronically Signed   By: Genevie Ann M.D.   On: 04/06/2015 15:53    ASSESSMENT AND PLAN: 1. CAD s/p inferior MI in Dec 2016 with DES to distal RCA. Large thrombus burden. Residual severe LAD disease. S/p CABG x 4 .  Continue ASA and Plavix 2. Chronic systolic CHF. EF 30-35%. Normal PA pressures. Milrinone stopped this am. Change lopressor to Coreg 3.125mg  BID.  If BP remains stable can add Lisinopril 5mg  daily.   3. Hyperlipidemia on statin.  Continue Lipitor 4. Paroxysmal Atrial fib: Is back in NSR this am on Amio.  LFTs midly elevated but trending downward.  Continue Amio 400mg  BID x 1 week then 400mg  daily for 2 weeks then 200mg  daily.   5. HTN: BP is stable   Ryan Margarita, MD  04/12/2015  9:05 AM

## 2015-04-12 NOTE — Care Management Note (Addendum)
Case Management Note Previous CM note initiated by Jacqlyn Krauss RN CM  Patient Details  Name: Ryan Ward MRN: [id number] Date of Birth: Jun 03, 1945  Subjective/Objective: [name] Pt with CAD s/p inferior MI in Dec 2016 with DES to distal RCA. Large thrombus burden. Residual severe LAD disease. Plan CABG on Wednesday after Plavix washout. On IV heparin to reduce risk of stent thrombosis.  .                   Action/Plan:CM will continue to monitor for disposition needs.   Expected Discharge Date:                  Expected Discharge Plan:  [place]  In-House Referral:     Discharge planning Services  CM Consult  Post Acute Care Choice:    Choice offered to:     DME Arranged:    DME Agency:     HH Arranged:    [place] Agency:     Status of Service:  In process, will continue to follow  Medicare Important Message Given:  Yes Date Medicare IM Given:    Medicare IM give by:    Date Additional Medicare IM Given:    Additional Medicare Important Message give by:     If discussed at [place] of Stay Meetings, dates discussed:    Additional Comments:  04/12/15- 1200- [name] RN, BSN- received pt in tx from 2S on 4/10- s/p CABG- pt progressing well, still with epw , post op afib. Anticipate return home-  CM to continue to follow.   Dawayne Patricia, RN 04/12/2015, 11:53 AM

## 2015-04-12 NOTE — Progress Notes (Signed)
Utilization review completed.  

## 2015-04-12 NOTE — Progress Notes (Addendum)
[address]       [place] [address]             [phone]      6 Days Post-Op Procedure(s) (LRB): CORONARY ARTERY BYPASS GRAFT TIMES FOUR  USING LEFT INTERNAL MAMMARY ARTERY TO THE LEFT ANTERIOR DESCENDING, BILATERAL GREATER SAPHENOUS ENDOVEIN HARVEST GRAFT TO PROXIMAL AND MID POSTERIOR DESCENDING AND DIAGONAL CORONARY ARTERIES AND PLACEMENT OR RIGHT FEMORAL A-LINE.  (N/A) TRANSESOPHAGEAL ECHOCARDIOGRAM (TEE) (N/A) Subjective: Feel exhausted, but overall improving  Objective: Vital signs in last 24 hours: Temp:  [97.2 F (36.2 C)-98.3 F (36.8 C)] 97.2 F (36.2 C) (04/11 0450) Pulse Rate:  [60-75] 62 (04/11 0450) Cardiac Rhythm:  [-] Normal sinus rhythm (04/10 1900) Resp:  [18-24] 18 (04/10 2041) BP: (89-124)/(47-76) 120/58 mmHg (04/11 0450) SpO2:  [90 %-100 %] 97 % (04/11 0450) Weight:  [212 lb 8 oz (96.389 kg)] 212 lb 8 oz (96.389 kg) (04/11 0450)  Hemodynamic parameters for last 24 hours:    Intake/Output from previous day: 04/10 0701 - 04/11 0700 In: 318 [P.O.:240; I.V.:78] Out: 450 [Urine:450] Intake/Output this shift:    General appearance: alert, cooperative and no distress Heart: regular rate and rhythm Lungs: clear to auscultation bilaterally Abdomen: benign Extremities: + edema Wound: incis healing well  Lab Results:  Recent Labs  04/11/15 0423 04/12/15 0652  WBC 12.3* 11.2*  HGB 8.7* 9.0*  HCT 25.3* 27.1*  PLT 230 276   BMET:  Recent Labs  04/11/15 0423 04/12/15 0652  NA 137 138  K 3.8 3.6  CL 98* 101  CO2 28 27  GLUCOSE 87 91  BUN 35* 33*  CREATININE 1.55* 1.23  CALCIUM 8.1* 8.6*    PT/INR: No results for input(s): LABPROT, INR in the last 72 hours. ABG    Component Value Date/Time   PHART 7.407 04/09/2015 0928   HCO3 27.1* 04/09/2015 0928   TCO2 30 04/10/2015 1735   ACIDBASEDEF 5.0* 04/07/2015 0040   O2SAT 63.1 04/11/2015 0419   CBG (last 3)   Recent Labs  04/11/15 1639 04/11/15 2029  04/12/15 0558  GLUCAP 95 131* 92    Meds Scheduled Meds: . amiodarone  400 mg Oral BID  . antiseptic oral rinse  7 mL Mouth Rinse BID  . atorvastatin  80 mg Oral q1800  . cholecalciferol  5,000 Units Oral Daily  . clopidogrel  75 mg Oral Daily  . docusate sodium  200 mg Oral Daily  . enoxaparin (LOVENOX) injection  30 mg Subcutaneous Q24H  . ferrous Q000111Q C-folic acid  1 capsule Oral TID PC  . insulin aspart  0-24 Units Subcutaneous TID AC & HS  . levothyroxine  25 mcg Oral QAC breakfast  . metoprolol tartrate  12.5 mg Oral BID  . pantoprazole  40 mg Oral QAC breakfast  . sodium chloride flush  3 mL Intravenous Q12H   Continuous Infusions: . milrinone 0.125 mcg/kg/min (04/11/15 2149)   PRN Meds:.sodium chloride, ALPRAZolam, bisacodyl **OR** bisacodyl, ondansetron **OR** ondansetron (ZOFRAN) IV, sodium chloride flush, sodium chloride flush, sodium chloride flush, traMADol  Xrays Dg Chest 2 View  04/12/2015  CLINICAL DATA:  Postop CABG. EXAM: CHEST  2 VIEW COMPARISON:  04/11/2015 FINDINGS: Right central line remains in place, unchanged. Prior CABG. Bibasilar airspace opacities, likely atelectasis. Small bilateral effusions. Mild cardiomegaly. No real change since prior study. No pneumothorax. No acute bony abnormality. IMPRESSION: Stable bibasilar opacities, likely atelectasis. Stable small effusions. Electronically Signed   By: Lennette Bihari  [place] M.D.   On: 04/12/2015 07:26   Dg Chest Port 1 View  04/11/2015  CLINICAL DATA:  CABG. EXAM: PORTABLE CHEST 1 VIEW COMPARISON:  04/10/2015. FINDINGS: PICC line stable position. Prior CABG. Cardiomegaly with normal pulmonary vascularity. Low lung volumes with bibasilar atelectasis and/or infiltrates, slightly cleared from prior exam. No pleural effusion or pneumothorax. IMPRESSION: 1. Right PICC line in stable position. Prior CABG. Stable cardiomegaly . 2. Persistent but slightly improving bibasilar infiltrates/edema and basilar  atelectasis. Electronically Signed   By: [place]   On: 04/11/2015 07:26    Assessment/Plan: S/P Procedure(s) (LRB): CORONARY ARTERY BYPASS GRAFT TIMES FOUR  USING LEFT INTERNAL MAMMARY ARTERY TO THE LEFT ANTERIOR DESCENDING, BILATERAL GREATER SAPHENOUS ENDOVEIN HARVEST GRAFT TO PROXIMAL AND MID POSTERIOR DESCENDING AND DIAGONAL CORONARY ARTERIES AND PLACEMENT OR RIGHT FEMORAL A-LINE.  (N/A) TRANSESOPHAGEAL ECHOCARDIOGRAM (TEE) (N/A)  1 doing better, creat improved, will d/c milrinone this am 2 will ask cardiol to assist with finalizing home meds- still with volume overload 3 sugars well controlled 4 push rehab as able  LOS: 11 days    Ward,Ryan E 04/12/2015  D/c milrinone this am, cr improving Plan home meds with ef 20-25% Poss home in am I have seen and examined Ryan Ward and agree with the above assessment  and plan.  [name] MD Beeper [phone] Office [phone] 04/12/2015 8:13 AM

## 2015-04-12 NOTE — Progress Notes (Signed)
Pt. Ambulated with nurse 150 ft. Used front wheel walker, O2 tank on standby, if needed. Pt. Did not need oxygen, had no signs of SOB. Returned to chair, tolerated well.

## 2015-04-12 NOTE — Progress Notes (Signed)
CARDIAC REHAB PHASE I   PRE:  Rate/Rhythm: 6 SR  BP:  Supine:   Sitting: 101/77  Standing:    SaO2: 98%RA  MODE:  Ambulation: 350 ft   POST:  Rate/Rhythm: 68 SR  BP:  Supine:   Sitting: 108/59  Standing:    SaO2: 99%RA 1130-1148 Pt walked 350 ft on RA with rollator with steady gait. Tolerated well. Does not feel that he needs walker for home.   Graylon Good, RN BSN  04/12/2015 11:45 AM

## 2015-04-13 ENCOUNTER — Encounter (HOSPITAL_COMMUNITY): Payer: 59

## 2015-04-13 ENCOUNTER — Ambulatory Visit (HOSPITAL_COMMUNITY): Payer: Medicare Other

## 2015-04-13 LAB — GLUCOSE, CAPILLARY
Glucose-Capillary: 81 mg/dL (ref 65–99)
Glucose-Capillary: 95 mg/dL (ref 65–99)

## 2015-04-13 MED ORDER — TRAMADOL HCL 50 MG PO TABS: 50.0000 mg | ORAL_TABLET | Freq: Four times a day (QID) | ORAL | Status: DC | PRN

## 2015-04-13 MED ORDER — FE FUMARATE-B12-VIT C-FA-IFC PO CAPS: 1.0000 | ORAL_CAPSULE | Freq: Three times a day (TID) | ORAL | Status: DC

## 2015-04-13 MED ORDER — LEVOTHYROXINE SODIUM 25 MCG PO TABS: 25.0000 ug | ORAL_TABLET | Freq: Every day | ORAL | Status: DC

## 2015-04-13 MED ORDER — CARVEDILOL 3.125 MG PO TABS
3.1250 mg | ORAL_TABLET | Freq: Two times a day (BID) | ORAL | Status: DC
  Administered 2015-04-13: 3.125 mg via ORAL
  Filled 2015-04-13: qty 1

## 2015-04-13 MED ORDER — FUROSEMIDE 40 MG PO TABS: 40.0000 mg | ORAL_TABLET | Freq: Every day | ORAL | Status: DC

## 2015-04-13 MED ORDER — CLOPIDOGREL BISULFATE 75 MG PO TABS: 75.0000 mg | ORAL_TABLET | Freq: Every day | ORAL | Status: DC

## 2015-04-13 MED ORDER — AMIODARONE HCL 400 MG PO TABS: 400.0000 mg | ORAL_TABLET | Freq: Two times a day (BID) | ORAL | Status: DC

## 2015-04-13 MED ORDER — POTASSIUM CHLORIDE ER 20 MEQ PO TBCR: 20.0000 meq | EXTENDED_RELEASE_TABLET | Freq: Every day | ORAL | Status: DC

## 2015-04-13 NOTE — Care Management Note (Signed)
Case Management Note Previous CM note initiated by Jacqlyn Krauss RN CM  Patient Details  Name: Ryan Ward MRN: [id number] Date of Birth: 20-Mar-1945  Subjective/Objective: [name] Pt with CAD s/p inferior MI in Dec 2016 with DES to distal RCA. Large thrombus burden. Residual severe LAD disease. Plan CABG on Wednesday after Plavix washout. On IV heparin to reduce risk of stent thrombosis.  .                   Action/Plan:CM will continue to monitor for disposition needs.   Expected Discharge Date:    04/13/15              Expected Discharge Plan:  [place]  In-House Referral:     Discharge planning Services  CM Consult  Post Acute Care Choice:    Choice offered to:     DME Arranged:    DME Agency:     HH Arranged:    [place] Agency:     Status of Service:  Completed, signed off  Medicare Important Message Given:  Yes Date Medicare IM Given:    Medicare IM give by:    Date Additional Medicare IM Given:    Additional Medicare Important Message give by:     If discussed at [place] of Stay Meetings, dates discussed:    Discharge Disposition: home/self care   Additional Comments:  04/12/15- 1200- [name] RN, BSN- received pt in tx from 2S on 4/10- s/p CABG- pt progressing well, still with epw , post op afib. Anticipate return home-  CM to continue to follow.   Dawayne Patricia, RN 04/13/2015, 10:10 AM

## 2015-04-13 NOTE — Progress Notes (Signed)
Patient D/C home with wife. D/C instructions given to patient. Girlfriend at the bedside. PICC removed by IV team. Tele monitor removed. CCMD notified. Patient personal belongings given to GF. Patient wheeled to D/C home with wife.   Domingo Dimes RN

## 2015-04-13 NOTE — Progress Notes (Signed)
Ryan WardSuite [address]       [place] [address]             [phone]      7 Days Post-Op Procedure(s) (LRB): CORONARY ARTERY BYPASS GRAFT TIMES FOUR  USING LEFT INTERNAL MAMMARY ARTERY TO THE LEFT ANTERIOR DESCENDING, BILATERAL GREATER SAPHENOUS ENDOVEIN HARVEST GRAFT TO PROXIMAL AND MID POSTERIOR DESCENDING AND DIAGONAL CORONARY ARTERIES AND PLACEMENT OR RIGHT FEMORAL A-LINE.  (N/A) TRANSESOPHAGEAL ECHOCARDIOGRAM (TEE) (N/A) Subjective: Conts to feel better.  Objective: Vital signs in last 24 hours: Temp:  [97.3 F (36.3 C)-98.3 F (36.8 C)] 97.3 F (36.3 C) (04/12 0528) Pulse Rate:  [55-70] 57 (04/12 0528) Cardiac Rhythm:  [-] Sinus bradycardia (04/11 1906) Resp:  [18] 18 (04/12 0528) BP: (101-120)/(59-72) 103/65 mmHg (04/12 0528) SpO2:  [98 %-100 %] 99 % (04/12 0528) Weight:  [210 lb 3.2 oz (95.346 kg)] 210 lb 3.2 oz (95.346 kg) (04/12 0528)  Hemodynamic parameters for last 24 hours:    Intake/Output from previous day: 04/11 0701 - 04/12 0700 In: 243 [P.O.:240; I.V.:3] Out: 600 [Urine:600] Intake/Output this shift:    General appearance: alert, cooperative and no distress Heart: regular rate and rhythm Lungs: clear to auscultation bilaterally Abdomen: benign Extremities: + LE edema Wound: incis healing well  Lab Results:  Recent Labs  04/11/15 0423 04/12/15 0652  WBC 12.3* 11.2*  HGB 8.7* 9.0*  HCT 25.3* 27.1*  PLT 230 276   BMET:  Recent Labs  04/11/15 0423 04/12/15 0652  NA 137 138  K 3.8 3.6  CL 98* 101  CO2 28 27  GLUCOSE 87 91  BUN 35* 33*  CREATININE 1.55* 1.23  CALCIUM 8.1* 8.6*    PT/INR: No results for input(s): LABPROT, INR in the last 72 hours. ABG    Component Value Date/Time   PHART 7.407 04/09/2015 0928   HCO3 27.1* 04/09/2015 0928   TCO2 30 04/10/2015 1735   ACIDBASEDEF 5.0* 04/07/2015 0040   O2SAT 63.1 04/11/2015 0419   CBG (last 3)   Recent Labs  04/12/15 1618 04/12/15 2153 04/13/15 0621  GLUCAP  86 104* 81    Meds Scheduled Meds: . amiodarone  400 mg Oral BID  . antiseptic oral rinse  7 mL Mouth Rinse BID  . atorvastatin  80 mg Oral q1800  . carvedilol  3.125 mg Oral BID WC  . cholecalciferol  5,000 Units Oral Daily  . clopidogrel  75 mg Oral Daily  . docusate sodium  200 mg Oral Daily  . enoxaparin (LOVENOX) injection  30 mg Subcutaneous Q24H  . ferrous Q000111Q C-folic acid  1 capsule Oral TID PC  . insulin aspart  0-24 Units Subcutaneous TID AC & HS  . levothyroxine  25 mcg Oral QAC breakfast  . pantoprazole  40 mg Oral QAC breakfast  . sodium chloride flush  3 mL Intravenous Q12H   Continuous Infusions:  PRN Meds:.sodium chloride, ALPRAZolam, bisacodyl **OR** bisacodyl, ondansetron **OR** ondansetron (ZOFRAN) IV, sodium chloride flush, sodium chloride flush, sodium chloride flush, traMADol  Xrays Dg Chest 2 View  04/12/2015  CLINICAL DATA:  Postop CABG. EXAM: CHEST  2 VIEW COMPARISON:  04/11/2015 FINDINGS: Right central line remains in place, unchanged. Prior CABG. Bibasilar airspace opacities, likely atelectasis. Small bilateral effusions. Mild cardiomegaly. No real change since prior study. No pneumothorax. No acute bony abnormality. IMPRESSION: Stable bibasilar opacities, likely atelectasis. Stable small effusions. Electronically Signed   By: Rolm Baptise M.D.   On: 04/12/2015 07:26  Assessment/Plan: S/P Procedure(s) (LRB): CORONARY ARTERY BYPASS GRAFT TIMES FOUR  USING LEFT INTERNAL MAMMARY ARTERY TO THE LEFT ANTERIOR DESCENDING, BILATERAL GREATER SAPHENOUS ENDOVEIN HARVEST GRAFT TO PROXIMAL AND MID POSTERIOR DESCENDING AND DIAGONAL CORONARY ARTERIES AND PLACEMENT OR RIGHT FEMORAL A-LINE.  (N/A) TRANSESOPHAGEAL ECHOCARDIOGRAM (TEE) (N/A)  1 conts to progress nicely 2 sinus brady , mostly in 50's 3 no new labs today. Will d/c coreg per Dr Theodosia Blender instructions 3 stable for d/c  4 bp a bit low for adding ace inhib- also some renal insuff 5 will send  home on lasix/K+ and get BMET in 1 week at cardiology appt  LOS: 12 days    Ryan Ward E 04/13/2015

## 2015-04-13 NOTE — Progress Notes (Signed)
[id number] Education completed with pt and fiancee' who voiced understanding. Encouraged IS and flutter valve. Discussed sternal precautions and ex ed. Gave heart healthy diet. Will send referral to [facility] Phase 2. Pt was in program prior to admission. Put on discharge video on to be viewed. Graylon Good RN BSN 04/13/2015 10:15 AM

## 2015-04-15 ENCOUNTER — Ambulatory Visit (HOSPITAL_COMMUNITY): Payer: Medicare Other

## 2015-04-15 ENCOUNTER — Encounter (HOSPITAL_COMMUNITY): Payer: 59

## 2015-04-18 ENCOUNTER — Telehealth: Payer: Self-pay | Admitting: Cardiology

## 2015-04-18 ENCOUNTER — Encounter (HOSPITAL_COMMUNITY): Payer: 59

## 2015-04-18 ENCOUNTER — Ambulatory Visit (HOSPITAL_COMMUNITY): Payer: Medicare Other

## 2015-04-18 NOTE — Telephone Encounter (Signed)
Returned call to patient.He stated he has been having sob when he lies down at night.Stated sob started before he was discharged from hospital.He also has noticed increased swelling in both lower legs and feet.Weight stable.Spoke to [place] he advised if B/P good ok to increase lasix to 40 mg twice a day.Advised to keep appointment with Ignacia Bayley PA 04/21/15 at 11:30 am.Advised to call back if needed.

## 2015-04-18 NOTE — Telephone Encounter (Signed)
Pt is having problems breathing when he lays down,he think he might need some oxygen ordered.

## 2015-04-20 ENCOUNTER — Ambulatory Visit (HOSPITAL_COMMUNITY): Payer: Medicare Other

## 2015-04-20 ENCOUNTER — Encounter (HOSPITAL_COMMUNITY): Payer: 59

## 2015-04-21 ENCOUNTER — Encounter: Payer: Self-pay | Admitting: Nurse Practitioner

## 2015-04-21 ENCOUNTER — Ambulatory Visit (INDEPENDENT_AMBULATORY_CARE_PROVIDER_SITE_OTHER): Payer: 59 | Admitting: Nurse Practitioner

## 2015-04-21 VITALS — BP 108/71 | HR 71 | Ht 67.0 in | Wt 201.4 lb

## 2015-04-21 DIAGNOSIS — I5022 Chronic systolic (congestive) heart failure: Secondary | ICD-10-CM

## 2015-04-21 DIAGNOSIS — E785 Hyperlipidemia, unspecified: Secondary | ICD-10-CM

## 2015-04-21 DIAGNOSIS — R9431 Abnormal electrocardiogram [ECG] [EKG]: Secondary | ICD-10-CM

## 2015-04-21 DIAGNOSIS — I2510 Atherosclerotic heart disease of native coronary artery without angina pectoris: Secondary | ICD-10-CM | POA: Diagnosis not present

## 2015-04-21 DIAGNOSIS — I110 Hypertensive heart disease with heart failure: Secondary | ICD-10-CM | POA: Diagnosis not present

## 2015-04-21 DIAGNOSIS — Z951 Presence of aortocoronary bypass graft: Secondary | ICD-10-CM | POA: Diagnosis not present

## 2015-04-21 DIAGNOSIS — I4581 Long QT syndrome: Secondary | ICD-10-CM

## 2015-04-21 DIAGNOSIS — I255 Ischemic cardiomyopathy: Secondary | ICD-10-CM | POA: Insufficient documentation

## 2015-04-21 NOTE — Patient Instructions (Signed)
Your physician recommends that you return for lab work TODAY.  Please follow-up as scheduled.

## 2015-04-21 NOTE — Progress Notes (Signed)
Office Visit    Patient Name: Ryan Ward Date of Encounter: 04/21/2015  Primary Care Provider:  Leonard Downing, MD Primary Cardiologist:  P. Martinique, MD   Chief Complaint    70 year old male status post recent coronary artery bypass grafting, who presents for follow-up.  Past Medical History    Past Medical History  Diagnosis Date  . OSA (obstructive sleep apnea)     a. not using CPAP at home, does have machine  . Hypertensive heart disease   . Hyperlipidemia   . CAD (coronary artery disease)     a. 12/07/14 Inf STEMI/PCI: LAD 85p/m, 39m/d, LCX 80ost, RCA 30p/m, 78m/d, 100d (4.0x20 Promus Premier DES). Hosp course complicated by VF arrest, CGS, CHB req temp wire; b. 03/2015 Cath: LAD 85p/m, 95d, LCx 80ost, RCA 30-40p/m/d, RPDA 75, EF 30-35%, Nl CO; c. 04/06/2015 CABGx4 (LIMA->LAD, VG->Diag, Seq VG->prox RPDA->mid RPDA.  . Paroxysmal atrial fibrillation ([facility])     a. 12/2014 In setting of Inf MI->convertred spont.  . Pulmonary embolism ([facility])     a. 12/2014 R PT and peroneal vein DVT and RUL PE-->Xarelto x 3 mos.  . Asthma     VERY MILD  . Ischemic cardiomyopathy     a. 03/2015 Echo: EF 30-35%, diff HK, mild MR, mod dil RA, PASP 27mmHg.  [name] Chronic systolic CHF (congestive heart failure) ([facility])     a. 03/2015 Echo: EF 30-35%, diff HK.   Past Surgical History  Procedure Laterality Date  . Hernia repair    . Cardiac catheterization N/A 12/07/2014    Procedure: Coronary Stent Intervention;  Surgeon: Peter M Martinique, MD;  Location: [facility];  Service: Cardiovascular;  Laterality: N/A;  STEMI  . Cardiac catheterization N/A 12/07/2014    Procedure: IABP Insertion;  Surgeon: Peter M Martinique, MD;  Location: [facility];  Service: Cardiovascular;  Laterality: N/A;  . Cardiac catheterization N/A 12/07/2014    Procedure: Temporary Pacemaker;  Surgeon: Peter M Martinique, MD;  Location: [facility];  Service: Cardiovascular;  Laterality: N/A;  . Cardiac catheterization  N/A 12/07/2014    Procedure: Left Heart Cath and Coronary Angiography;  Surgeon: Peter M Martinique, MD;  Location: [facility];  Service: Cardiovascular;  Laterality: N/A;  . Cardiac catheterization N/A 03/29/2015    Procedure: Right/Left Heart Cath and Coronary Angiography;  Surgeon: Peter M Martinique, MD;  Location: [facility];  Service: Cardiovascular;  Laterality: N/A;  . Tonsillectomy    . Coronary artery bypass graft N/A 04/06/2015    Procedure: CORONARY ARTERY BYPASS GRAFT TIMES FOUR  USING LEFT INTERNAL MAMMARY ARTERY TO THE LEFT ANTERIOR DESCENDING, BILATERAL GREATER SAPHENOUS ENDOVEIN HARVEST GRAFT TO PROXIMAL AND MID POSTERIOR DESCENDING AND DIAGONAL CORONARY ARTERIES AND PLACEMENT OR RIGHT FEMORAL A-LINE. ;  Surgeon: [name], MD;  Location: [facility];  Service: Open Heart Surgery;  Laterality: N/A;  . Tee without cardioversion N/A 04/06/2015    Procedure: TRANSESOPHAGEAL ECHOCARDIOGRAM (TEE);  Surgeon: [name], MD;  Location: [facility];  Service: Open Heart Surgery;  Laterality: N/A;    Allergies  No Known Allergies  History of Present Illness    70 year old male with the above complex past medical history. He suffered an inferior ST segment elevation myocardial infarction in December 2016 with catheterization revealing an occluded RCA and also severe LAD and circumflex disease. The RCA was successfully treated with a drug-eluting stent. Periprocedural course was Located by cardiogenic shock, ventricular fibrillation, and complete  heart block. He also developed atrial fibrillation. During the same admission, he developed a right lower extremity DVT and right upper lobe pulmonary embolus. He was then placed on Xarelto. Ultimately, it was felt that he would require more complete revascularization given residual LAD disease. The circumflex was not felt to be amenable to PCI. He was followed in the outpatient setting and subsequently underwent relook catheterization in March  which showed patency of the RCA stent with residual distal RPDA disease as well as proximal and mid LAD disease, and ostial circumflex disease. He was referred for thoracic surgery evaluation and recommendation was made for bypass surgery with plan for admission while holding Plavix and overlapping with heparin. Patient was admitted earlier this month and underwent successful coronary artery bypass grafting 4 as outlined in the past medical history. Postoperative course was complicated by atrial fibrillation which was treated successfully with amiodarone therapy. He was subsequently discharged on April 12 and says that immediately following discharge, he noted orthopnea. He has been having lower extremity edema ever since his surgery. His discharge weight was 210 pounds, which was roughly 24 pounds above his preoperative weight. He called the office earlier this week and was advised to increase his Lasix to 40 mg twice a day. Since doing so, he has had resolution of orthopnea. His weight has also come down to 201 pounds on our scale today. He has noted improvement in lower extremity edema though he still has fairly significant edema of his ankles and calves. He has not been having any angina but does have chest wall pain with deep breathing or coughing. He denies PND, dizziness, syncope, or early satiety.  Home Medications    Prior to Admission medications   Medication Sig Start Date End Date Taking? Authorizing Provider  amiodarone (PACERONE) 400 MG tablet Take 1 tablet (400 mg total) by mouth 2 (two) times daily. For 1 week, then 400 mg once daily for 2 weeks, then 200 mg daily. 04/13/15  Yes Wayne E Gold, PA-C  aspirin EC 81 MG tablet Take 1 tablet (81 mg total) by mouth daily. 03/24/15  Yes Peter M Martinique, MD  atorvastatin (LIPITOR) 80 MG tablet Take 1 tablet (80 mg total) by mouth daily at 6 PM. Patient taking differently: Take 80 mg by mouth at bedtime.  12/15/14  Yes Erma Heritage, PA    Cholecalciferol (VITAMIN D3) 5000 UNITS TABS Take 5,000 Units by mouth daily.    Yes Historical Provider, MD  clopidogrel (PLAVIX) 75 MG tablet Take 1 tablet (75 mg total) by mouth daily. 04/13/15  Yes Wayne E Gold, PA-C  ferrous Q000111Q C-folic acid (TRINSICON / FOLTRIN) capsule Take 1 capsule by mouth 3 (three) times daily after meals. 04/13/15  Yes Wayne E Gold, PA-C  furosemide (LASIX) 40 MG tablet Take 1 tablet (40 mg total) by mouth 2 (two) times daily. 04/18/15  Yes Peter M Martinique, MD  levothyroxine (SYNTHROID, LEVOTHROID) 25 MCG tablet Take 1 tablet (25 mcg total) by mouth daily before breakfast. 04/13/15  Yes Wayne E Gold, PA-C  metoprolol tartrate (LOPRESSOR) 25 MG tablet Take 1 tablet by mouth 2 (two) times daily. 01/17/15  Yes Historical Provider, MD  Multiple Vitamin (MULTIVITAMIN WITH MINERALS) TABS tablet Take 1 tablet by mouth daily.   Yes Historical Provider, MD  potassium chloride 20 MEQ TBCR Take 20 mEq by mouth daily. 04/13/15  Yes Odis Luster  PROAIR HFA 108 [phone] Base) MCG/ACT inhaler  02/25/15  Yes Historical Provider, MD  traMADol (ULTRAM) 50 MG tablet Take 1-2 tablets (50-100 mg total) by mouth every 6 (six) hours as needed for moderate pain. 04/13/15  Yes John Giovanni, PA-C    Review of Systems    As above, he was expressing orthopnea following discharge. This has improved with change in Lasix dose. He has ongoing lower extremity edema and his weight remains above his previous dry weight. He has not been having angina and denies dyspnea on exertion, PND, dizziness, syncope, or early satiety.  All other systems reviewed and are otherwise negative except as noted above.  Physical Exam    VS:  BP 108/71 mmHg  Pulse 71  Ht 5\' 7"  (1.702 m)  Wt 201 lb 6.4 oz (91.354 kg)  BMI 31.54 kg/m2 , BMI Body mass index is 31.54 kg/(m^2). GEN: Well nourished, well developed, in no acute distress. HEENT: normal. Neck: Supple, no JVD, carotid bruits, or masses. Cardiac:  RRR, no murmurs, rubs, or gallops. No clubbing, cyanosis, 2-3+ bilateral lower extremity edema to the knee. Radials/DP/PT 2+ and equal bilaterally.  Respiratory:  Respirations regular and unlabored, clear to auscultation bilaterally. GI: Soft, nontender, nondistended, BS + x 4. MS: no deformity or atrophy. Skin: warm and dry, no rash. Neuro:  Strength and sensation are intact. Psych: Normal affect.  Accessory Clinical Findings    ECG - Regular sinus rhythm, 69, incomplete right bundle branch block, prior inferior infarct, prolonged QT at 526.  Assessment & Plan    1.  Coronary artery disease: Status post coronary artery bypass grafting 4 earlier this month. He was discharged a week ago and has been having some orthopnea but this has improved following titration of his Lasix dose. He has not been having any angina nor dyspnea on exertion. He does have lower extremity edema and remains volume overloaded. Continue aspirin, statin, Plavix, and beta blocker therapy.  2. Ischemic cardiopathy/chronic systolic congestive heart failure: EF 30-35% by echo in March 2017. As above, he was experiencing orthopnea following surgery and discharge. This has improved with titration of his Lasix. We will follow up with basic metabolic panel today. He continues to have significant lower extremity edema and I will not change his Lasix dose at this time. He is on beta blocker therapy but is not on an ACE inhibitor/ARB/ARNI/Spiro at this time.  His blood pressure is on the low side, and as he requires continued diuresis, I would not add ACE inhibitor today. He does have follow-up in one week and we can reconsider at that time.  He will require follow-up echo 3 months post revascularization to determine candidacy for ICD therapy.  3. Postoperative atrial fibrillation/prolonged QT: He is in sinus rhythm today. He remains on amiodarone. His QT is prolonged and he is due to reduce his dose to 400 mg daily today. QT was  similarly prolonged at discharge from the hospital.  He will have follow-up ECG next week. He is not on oral anticoagulation.  4. Hypertensive heart disease: Blood pressure is stable to soft today. Continue beta blocker and diuretic therapy.  5. Hyperlipidemia: Continue high potency statin therapy. LDL was 61 in February.  6.  H/o DVT/PE:  He has completed a 3 month course of xarelto.  7. Dispo:  F/u cbc/bmet today.  He has f/u with Dr. Martinique in one week and will need repeat ECG @ that time.  F/U echo in 3 mos.  Murray Hodgkins, NP 04/21/2015, 12:40 PM

## 2015-04-22 ENCOUNTER — Ambulatory Visit (HOSPITAL_COMMUNITY): Payer: Medicare Other

## 2015-04-22 ENCOUNTER — Encounter (HOSPITAL_COMMUNITY): Payer: 59

## 2015-04-22 LAB — HEPATIC FUNCTION PANEL
ALBUMIN: 3.3 g/dL — AB (ref 3.6–5.1)
ALK PHOS: 98 U/L (ref 40–115)
ALT: 27 U/L (ref 9–46)
AST: 16 U/L (ref 10–35)
Bilirubin, Direct: 0.3 mg/dL — ABNORMAL HIGH (ref <=0.2)
Indirect Bilirubin: 0.8 mg/dL (ref 0.2–1.2)
TOTAL PROTEIN: 5.5 g/dL — AB (ref 6.1–8.1)
Total Bilirubin: 1.1 mg/dL (ref 0.2–1.2)

## 2015-04-22 LAB — TSH: TSH: 4.84 mIU/L — ABNORMAL HIGH (ref 0.40–4.50)

## 2015-04-22 LAB — T4, FREE: Free T4: 1.5 ng/dL (ref 0.8–1.8)

## 2015-04-25 ENCOUNTER — Ambulatory Visit (HOSPITAL_COMMUNITY): Payer: Medicare Other

## 2015-04-25 ENCOUNTER — Encounter (HOSPITAL_COMMUNITY): Payer: 59

## 2015-04-27 ENCOUNTER — Encounter (HOSPITAL_COMMUNITY): Payer: 59

## 2015-04-28 ENCOUNTER — Encounter: Payer: Self-pay | Admitting: Cardiology

## 2015-04-28 ENCOUNTER — Ambulatory Visit (INDEPENDENT_AMBULATORY_CARE_PROVIDER_SITE_OTHER): Payer: 59 | Admitting: Cardiology

## 2015-04-28 VITALS — BP 97/66 | HR 52 | Ht 67.0 in | Wt 190.6 lb

## 2015-04-28 DIAGNOSIS — I25119 Atherosclerotic heart disease of native coronary artery with unspecified angina pectoris: Secondary | ICD-10-CM

## 2015-04-28 DIAGNOSIS — E785 Hyperlipidemia, unspecified: Secondary | ICD-10-CM

## 2015-04-28 DIAGNOSIS — I480 Paroxysmal atrial fibrillation: Secondary | ICD-10-CM

## 2015-04-28 DIAGNOSIS — I5042 Chronic combined systolic (congestive) and diastolic (congestive) heart failure: Secondary | ICD-10-CM

## 2015-04-28 DIAGNOSIS — Z951 Presence of aortocoronary bypass graft: Secondary | ICD-10-CM

## 2015-04-28 NOTE — Progress Notes (Signed)
Office Visit    Patient Name: Ryan Ward Date of Encounter: 04/28/2015  Primary Care Provider:  Leonard Downing, MD Primary Cardiologist:  P. Martinique, MD   Chief Complaint    70 year old male status post recent coronary artery bypass grafting, who presents for follow-up.  Past Medical History    Past Medical History  Diagnosis Date  . OSA (obstructive sleep apnea)     a. not using CPAP at home, does have machine  . Hypertensive heart disease   . Hyperlipidemia   . CAD (coronary artery disease)     a. 12/07/14 Inf STEMI/PCI: LAD 85p/m, 72m/d, LCX 80ost, RCA 30p/m, 78m/d, 100d (4.0x20 Promus Premier DES). Hosp course complicated by VF arrest, CGS, CHB req temp wire; b. 03/2015 Cath: LAD 85p/m, 95d, LCx 80ost, RCA 30-40p/m/d, RPDA 75, EF 30-35%, Nl CO; c. 04/06/2015 CABGx4 (LIMA->LAD, VG->Diag, Seq VG->prox RPDA->mid RPDA.  . Paroxysmal atrial fibrillation ([place])     a. 12/2014 In setting of Inf MI->convertred spont.  . Pulmonary embolism ([place])     a. 12/2014 R PT and peroneal vein DVT and RUL PE-->Xarelto x 3 mos.  . Asthma     VERY MILD  . Ischemic cardiomyopathy     a. 03/2015 Echo: EF 30-35%, diff HK, mild MR, mod dil RA, PASP 38mmHg.  [name] Chronic systolic CHF (congestive heart failure) ([place])     a. 03/2015 Echo: EF 30-35%, diff HK.   Past Surgical History  Procedure Laterality Date  . Hernia repair    . Cardiac catheterization N/A 12/07/2014    Procedure: Coronary Stent Intervention;  Surgeon: Sequan Auxier M Martinique, MD;  Location: [facility];  Service: Cardiovascular;  Laterality: N/A;  STEMI  . Cardiac catheterization N/A 12/07/2014    Procedure: IABP Insertion;  Surgeon: Annie Saephan M Martinique, MD;  Location: [facility];  Service: Cardiovascular;  Laterality: N/A;  . Cardiac catheterization N/A 12/07/2014    Procedure: Temporary Pacemaker;  Surgeon: Chandell Attridge M Martinique, MD;  Location: [facility];  Service: Cardiovascular;  Laterality: N/A;  . Cardiac catheterization  N/A 12/07/2014    Procedure: Left Heart Cath and Coronary Angiography;  Surgeon: Maeson Lourenco M Martinique, MD;  Location: [facility];  Service: Cardiovascular;  Laterality: N/A;  . Cardiac catheterization N/A 03/29/2015    Procedure: Right/Left Heart Cath and Coronary Angiography;  Surgeon: Keenan Dimitrov M Martinique, MD;  Location: [facility];  Service: Cardiovascular;  Laterality: N/A;  . Tonsillectomy    . Coronary artery bypass graft N/A 04/06/2015    Procedure: CORONARY ARTERY BYPASS GRAFT TIMES FOUR  USING LEFT INTERNAL MAMMARY ARTERY TO THE LEFT ANTERIOR DESCENDING, BILATERAL GREATER SAPHENOUS ENDOVEIN HARVEST GRAFT TO PROXIMAL AND MID POSTERIOR DESCENDING AND DIAGONAL CORONARY ARTERIES AND PLACEMENT OR RIGHT FEMORAL A-LINE. ;  Surgeon: [name], MD;  Location: [facility];  Service: Open Heart Surgery;  Laterality: N/A;  . Tee without cardioversion N/A 04/06/2015    Procedure: TRANSESOPHAGEAL ECHOCARDIOGRAM (TEE);  Surgeon: [name], MD;  Location: [facility];  Service: Open Heart Surgery;  Laterality: N/A;    Allergies  No Known Allergies  History of Present Illness    70 year old male with the above complex past medical history. He suffered an inferior ST segment elevation myocardial infarction in December 2016 with catheterization revealing an occluded RCA and also severe LAD and circumflex disease. The RCA was successfully treated with a drug-eluting stent. Periprocedural course was Located by cardiogenic shock, ventricular fibrillation, and complete  heart block. He also developed atrial fibrillation. During the same admission, he developed a right lower extremity DVT and right upper lobe pulmonary embolus. He was then placed on Xarelto. Ultimately, it was felt that he would require more complete revascularization given residual LAD disease. The circumflex was not felt to be amenable to PCI. He was followed in the outpatient setting and subsequently underwent relook catheterization in March  which showed patency of the RCA stent with residual distal RPDA disease as well as proximal and mid LAD disease, and ostial circumflex disease. Patient  underwent successful coronary artery bypass grafting 4. Postoperative course was complicated by atrial fibrillation which was treated successfully with amiodarone therapy. He was subsequently discharged on April 12 and says that immediately following discharge, he noted increased orthopnea and edema. His lasix was increased to twice a day and since then he has lost almost 20 lbs and is at his preop weight. He still has some edema but improved.  He denies any chest pain and feels that his edema is improved. He denies PND, dizziness, syncope, or early satiety.  Home Medications    Prior to Admission medications   Medication Sig Start Date End Date Taking? Authorizing Provider  amiodarone (PACERONE) 400 MG tablet Take 1 tablet (400 mg total) by mouth 2 (two) times daily. For 1 week, then 400 mg once daily for 2 weeks, then 200 mg daily. 04/13/15  Yes Wayne E Gold, PA-C  aspirin EC 81 MG tablet Take 1 tablet (81 mg total) by mouth daily. 03/24/15  Yes Tzippy Testerman M Martinique, MD  atorvastatin (LIPITOR) 80 MG tablet Take 1 tablet (80 mg total) by mouth daily at 6 PM. Patient taking differently: Take 80 mg by mouth at bedtime.  12/15/14  Yes Erma Heritage, PA  Cholecalciferol (VITAMIN D3) 5000 UNITS TABS Take 5,000 Units by mouth daily.    Yes Historical Provider, MD  clopidogrel (PLAVIX) 75 MG tablet Take 1 tablet (75 mg total) by mouth daily. 04/13/15  Yes Wayne E Gold, PA-C  ferrous Q000111Q C-folic acid (TRINSICON / FOLTRIN) capsule Take 1 capsule by mouth 3 (three) times daily after meals. 04/13/15  Yes Wayne E Gold, PA-C  furosemide (LASIX) 40 MG tablet Take 1 tablet (40 mg total) by mouth 2 (two) times daily. 04/18/15  Yes Cyrena Kuchenbecker M Martinique, MD  levothyroxine (SYNTHROID, LEVOTHROID) 25 MCG tablet Take 1 tablet (25 mcg total) by mouth daily before  breakfast. 04/13/15  Yes Wayne E Gold, PA-C  metoprolol tartrate (LOPRESSOR) 25 MG tablet Take 1 tablet by mouth 2 (two) times daily. 01/17/15  Yes Historical Provider, MD  Multiple Vitamin (MULTIVITAMIN WITH MINERALS) TABS tablet Take 1 tablet by mouth daily.   Yes Historical Provider, MD  potassium chloride 20 MEQ TBCR Take 20 mEq by mouth daily. 04/13/15  Yes John Giovanni, PA-C  PROAIR HFA 108 [phone] Base) MCG/ACT inhaler  02/25/15  Yes Historical Provider, MD  traMADol (ULTRAM) 50 MG tablet Take 1-2 tablets (50-100 mg total) by mouth every 6 (six) hours as needed for moderate pain. 04/13/15  Yes John Giovanni, PA-C    Review of Systems    As noted in HPI. All other systems reviewed and are otherwise negative except as noted above.  Physical Exam    VS:  BP 97/66 mmHg  Pulse 52  Ht 5\' 7"  (1.702 m)  Wt 86.456 kg (190 lb 9.6 oz)  BMI 29.85 kg/m2 , BMI Body mass index is 29.85 kg/(m^2). GEN: Well nourished,  well developed, in no acute distress. HEENT: normal. Neck: Supple, no JVD, carotid bruits, or masses. Cardiac: RRR, no murmurs, rubs, or gallops. No clubbing, cyanosis, 2+ bilateral lower extremity edema to the knee. Radials/DP/PT 2+ and equal bilaterally.  Respiratory:  Respirations regular and unlabored, clear to auscultation bilaterally. GI: Soft, nontender, nondistended, BS + x 4. MS: no deformity or atrophy. Skin: warm and dry, no rash. Neuro:  Strength and sensation are intact. Psych: Normal affect.  Accessory Clinical Findings    ECG - not done  Lab Results  Component Value Date   WBC 11.2* 04/12/2015   HGB 9.0* 04/12/2015   HCT 27.1* 04/12/2015   PLT 276 04/12/2015   GLUCOSE 91 04/12/2015   CHOL 105* 02/17/2015   TRIG 60 02/17/2015   HDL 32* 02/17/2015   LDLCALC 61 02/17/2015   ALT 27 04/21/2015   AST 16 04/21/2015   NA 138 04/12/2015   K 3.6 04/12/2015   CL 101 04/12/2015   CREATININE 1.23 04/12/2015   BUN 33* 04/12/2015   CO2 27 04/12/2015   TSH 4.84*  04/21/2015   INR 1.64* 04/06/2015   HGBA1C 5.9* 12/07/2014     Assessment & Plan    1.  Coronary artery disease: Status post coronary artery bypass grafting 4 earlier this month. Prior inferior STEMi treated with DES.  Continue aspirin, statin, Plavix, and beta blocker therapy.  2. Ischemic cardiopathy/chronic systolic congestive heart failure: EF 30-35% by echo in March 2017. As above, he was experiencing orthopnea and edema following surgery and discharge. This has improved with titration of his Lasix. BMET is stable. He continues to have 2+ lower extremity edema and I will not change his Lasix dose at this time. He is on beta blocker therapy but is not on an ACE inhibitor/ARB/ARNI/Spiro at this time.  His blood pressure is on the low side, and as he requires continued diuresis, I would not add ACE inhibitor today. I will follow up in 4 weeks. Hopefully we can reduce lasix at that time and add ACEi/ARB/spiro if BP improves.  He will require follow-up echo 3 months post revascularization to determine candidacy for ICD therapy.  3. Postoperative atrial fibrillation/prolonged QT: He is in sinus rhythm today. He remains on amiodarone at 200 mg daily. Will repeat Ecg in 4 weeks. If he remains in NSR will stop amiodarone.   4. Hypertensive heart disease: Blood pressure is low  5. Hyperlipidemia: Continue high potency statin therapy. LDL was 61 in February.  6.  H/o DVT/PE:  He has completed a 3 month course of xarelto.  7. Dispo:   He has f/u with Dr. Martinique in 4 weeks and will need repeat ECG  And lab work @ that time.  F/U echo in 3 mos.  Ames Hoban Martinique, MD,FACC  04/28/2015, 9:01 PM

## 2015-04-28 NOTE — Patient Instructions (Signed)
Continue your current therapy  We will see you in 4 weeks with Ecg and blood work

## 2015-04-29 ENCOUNTER — Encounter (HOSPITAL_COMMUNITY): Payer: 59

## 2015-05-02 ENCOUNTER — Encounter (HOSPITAL_COMMUNITY): Payer: 59

## 2015-05-04 ENCOUNTER — Encounter (HOSPITAL_COMMUNITY): Payer: 59

## 2015-05-05 ENCOUNTER — Ambulatory Visit: Payer: 59 | Admitting: Cardiothoracic Surgery

## 2015-05-06 ENCOUNTER — Encounter (HOSPITAL_COMMUNITY): Payer: 59

## 2015-05-06 ENCOUNTER — Other Ambulatory Visit: Payer: Self-pay | Admitting: Thoracic Surgery (Cardiothoracic Vascular Surgery)

## 2015-05-06 ENCOUNTER — Other Ambulatory Visit: Payer: Self-pay | Admitting: Cardiothoracic Surgery

## 2015-05-06 DIAGNOSIS — Z951 Presence of aortocoronary bypass graft: Secondary | ICD-10-CM

## 2015-05-09 ENCOUNTER — Ambulatory Visit
Admission: RE | Admit: 2015-05-09 | Discharge: 2015-05-09 | Disposition: A | Discharge status: Home / Self Care | Payer: 59 | Source: Ambulatory Visit | Attending: Cardiothoracic Surgery | Admitting: Cardiothoracic Surgery

## 2015-05-09 ENCOUNTER — Encounter (HOSPITAL_COMMUNITY): Payer: 59

## 2015-05-09 ENCOUNTER — Ambulatory Visit (INDEPENDENT_AMBULATORY_CARE_PROVIDER_SITE_OTHER): Payer: Self-pay | Admitting: Surgical

## 2015-05-09 VITALS — BP 94/65 | HR 45 | Resp 20 | Ht 67.0 in | Wt 190.0 lb

## 2015-05-09 DIAGNOSIS — Z951 Presence of aortocoronary bypass graft: Secondary | ICD-10-CM

## 2015-05-09 DIAGNOSIS — I213 ST elevation (STEMI) myocardial infarction of unspecified site: Secondary | ICD-10-CM

## 2015-05-09 DIAGNOSIS — I2510 Atherosclerotic heart disease of native coronary artery without angina pectoris: Secondary | ICD-10-CM

## 2015-05-09 NOTE — Patient Instructions (Signed)
The patient was given verbal instructions regarding ongoing activity restrictions as well as driving progression.

## 2015-05-09 NOTE — Progress Notes (Signed)
[address]       [place] [address]             [phone]                  [facility] [place] [facility] Medical Record [id number] Date of Birth: [birth year]  Referring [id number], [name], MD Primary Cardiology: Primary Care:ELKINS,WILSON [name], MD  Chief Complaint:  Follow Up Visit  DATE OF PROCEDURE: 04/06/2015 DATE OF DISCHARGE: 04/13/2015   OPERATIVE REPORT   PREOPERATIVE DIAGNOSIS: Coronary occlusive disease with severe left ventricular dysfunction.  POSTOPERATIVE DIAGNOSIS: Coronary occlusive disease with severe left ventricular dysfunction.  PROCEDURE PERFORMED: Coronary artery bypass grafting x4 with the left internal mammary to the left anterior descending coronary artery, reverse saphenous vein graft to the diagonal coronary artery, sequential reverse saphenous vein graft to the proximal and mid posterior descending coronary artery with bilateral greater saphenous via vein harvesting endoscopically and insertion of right femoral arterial line.  SURGEON: Lanelle Bal, MD  FIRST ASSISTANT: Ellwood Handler, PA.  History of Present Illness:    The patient is a 70 year old male status post the above described procedure. Currently he states that he is feeling well. He denies pain. He denies shortness of breath. He does describe some orthopnea which is improving. He was seen by Dr. Martinique recently doubled his Lasix to 40 mg twice a day which she says has made the orthopnea as well as lower extremity edema somewhat better. He is known to have severe left ventricular dysfunction preoperatively. He is ambulating well and he feels as though his endurance is improving over time. He denies fevers, chills or other significant constitutional symptoms.    Zubrod Score: At the time of surgery this patient's most appropriate activity status/level should be described as: []     0    Normal activity, no symptoms [x]     1     Restricted in physical strenuous activity but ambulatory, able to do out light work []     2    Ambulatory and capable of self care, unable to do work activities, up and about                 >50 % of waking hours                                                                                   []     3    Only limited self care, in bed greater than 50% of waking hours []     4    Completely disabled, no self care, confined to bed or chair []     5    Moribund  History  Smoking status  . Never Smoker   Smokeless tobacco  . Never Used       No Known Allergies  Current Outpatient Prescriptions  Medication Sig Dispense Refill  . amiodarone (PACERONE) 400 MG tablet Take 1 tablet (400 mg total) by mouth 2 (two) times daily. For 1 week, then 400 mg once daily for 2 weeks, then 200 mg daily. 70 tablet 1  . aspirin EC 81 MG tablet Take 1 tablet (81  mg total) by mouth daily. 90 tablet 3  . atorvastatin (LIPITOR) 80 MG tablet Take 1 tablet (80 mg total) by mouth daily at 6 PM. (Patient taking differently: Take 80 mg by mouth at bedtime. ) 30 tablet 6  . Cholecalciferol (VITAMIN D3) 5000 UNITS TABS Take 5,000 Units by mouth daily.     . clopidogrel (PLAVIX) 75 MG tablet Take 1 tablet (75 mg total) by mouth daily. 30 tablet 1  . ferrous Q000111Q C-folic acid (TRINSICON / FOLTRIN) capsule Take 1 capsule by mouth 3 (three) times daily after meals. 90 capsule 1  . furosemide (LASIX) 40 MG tablet Take 1 tablet (40 mg total) by mouth 2 (two) times daily. 60 tablet 3  . levothyroxine (SYNTHROID, LEVOTHROID) 25 MCG tablet Take 1 tablet (25 mcg total) by mouth daily before breakfast. 30 tablet 1  . metoprolol tartrate (LOPRESSOR) 25 MG tablet Take 1 tablet by mouth 2 (two) times daily.  11  . Multiple Vitamin (MULTIVITAMIN WITH MINERALS) TABS tablet Take 1 tablet by mouth daily.    . potassium chloride 20 MEQ TBCR Take 20 mEq by mouth daily. 30 tablet 1  . PROAIR HFA 108 (90 Base) MCG/ACT inhaler      . traMADol (ULTRAM) 50 MG tablet Take 1-2 tablets (50-100 mg total) by mouth every 6 (six) hours as needed for moderate pain. 50 tablet 0   No current facility-administered medications for this visit.       Physical Exam: BP 94/65 mmHg  Pulse 45  Resp 20  Ht 5\' 7"  (1.702 m)  Wt 190 lb (86.183 kg)  BMI 29.75 kg/m2  SpO2 99%  General appearance: alert, cooperative and no distress Heart: regular rate and rhythm and S1, S2 normal Lungs: Mildly diminished in the bases Abdomen: soft, non-tender; bowel sounds normal; no masses,  no organomegaly Extremities: 2 plus pitting edema Wounds: Incisions are all healing well without evidence of infection  Diagnostic Studies & Laboratory data:         Recent Radiology Findings: Dg Chest 2 View  05/09/2015  CLINICAL DATA:  Status post coronary artery bypass graft. EXAM: CHEST  2 VIEW COMPARISON:  April 12, 2015. FINDINGS: The heart size and mediastinal contours are within normal limits. Both lungs are clear. Status post Coronary artery bypass graft. No pneumothorax is seen. Minimal bilateral pleural effusions are noted. The visualized skeletal structures are unremarkable. IMPRESSION: Minimal bilateral pleural effusions. No other acute abnormality seen in the chest. Electronically Signed   By: Marijo Conception, M.D.   On: 05/09/2015 14:13      I have independently reviewed the above radiology findings and reviewed findings  with the patient.  Recent Labs: Lab Results  Component Value Date   WBC 11.2* 04/12/2015   HGB 9.0* 04/12/2015   HCT 27.1* 04/12/2015   PLT 276 04/12/2015   GLUCOSE 91 04/12/2015   CHOL 105* 02/17/2015   TRIG 60 02/17/2015   HDL 32* 02/17/2015   LDLCALC 61 02/17/2015   ALT 27 04/21/2015   AST 16 04/21/2015   NA 138 04/12/2015   K 3.6 04/12/2015   CL 101 04/12/2015   CREATININE 1.23 04/12/2015   BUN 33* 04/12/2015   CO2 27 04/12/2015   TSH 4.84* 04/21/2015   INR 1.64* 04/06/2015   HGBA1C 5.9* 12/07/2014       Assessment / Plan:  The patient is recovering nicely from his CABG. He does have some ongoing issues related to congestive failure/ventricular dysfunction. He is being  followed closely by cardiology. He is on amiodarone as well for postoperative atrial fibrillation. He is currently in sinus rhythm by examination. He will continue his twice daily Lasix until seen again by cardiology in a few weeks. We will see again in 1 month and when necessary prior to that should any surgically related issues present.         GOLD,WAYNE E 05/09/2015 2:44 PM

## 2015-05-10 ENCOUNTER — Other Ambulatory Visit: Payer: Self-pay | Admitting: Cardiology

## 2015-05-10 MED ORDER — FUROSEMIDE 40 MG PO TABS: 40.0000 mg | ORAL_TABLET | Freq: Two times a day (BID) | ORAL | Status: DC

## 2015-05-13 ENCOUNTER — Telehealth: Payer: Self-pay | Admitting: Cardiology

## 2015-05-13 NOTE — Telephone Encounter (Signed)
Returned call to patient. He reports doing well since heart surgery, wants to pursue re-enrollment of cardiac rehab. Informed him I am unsure if this requires new physician order, would seek recommendation from Dr. Martinique on this. Pt aware Dr. Martinique out of office and this will be followed up on next week.

## 2015-05-13 NOTE — Telephone Encounter (Signed)
Pt had heart surgery on 04-04-15.He wants to know when can he start Cardia Rehab again?He wants to know if he should start where he left off or should he start all over?

## 2015-05-15 NOTE — Telephone Encounter (Signed)
Patient should resume Cardiac Rehab program post op CABG. They will guide him on activity level.  Eva Vallee Martinique MD, [facility]

## 2015-05-17 ENCOUNTER — Telehealth: Payer: Self-pay

## 2015-05-17 NOTE — Telephone Encounter (Signed)
Pt called to inquire when he can return to work after his CAGB, in April. 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 Pt stated he does occasional lifting but he does not have to. Pt advised he see's Dr. Martinique on 6/7 @ 10 am and during that visit they will discuss his returning to work. Pt verbalized understanding, no additional questions at this time.  Pt stated he will bring paperwork he needs signed with him to the appointment.

## 2015-05-17 NOTE — Telephone Encounter (Signed)
Left detailed message for pt to contact rehab about restarting per dr Doug Sou okay.

## 2015-05-23 NOTE — Telephone Encounter (Signed)
Returned call to patient.He stated he needed FMLA form completed before appointment with Dr.Jordan.Stated he wants to return to work before he sees Dr.Jordan 06/08/15.Advised he can bring FMLA form tomorrow to office.Advised a company fills out [employer].Advised to ask for medical records and bring a check for $25.00.Post hospital appointment scheduled with Luisa Dago PA 06/01/15 at 11:30 AM.

## 2015-05-24 NOTE — Addendum Note (Signed)
Addendum  created 05/24/15 [id number] by Oleta Mouse, MD   Modules edited: Anesthesia Blocks and Procedures, Clinical Notes   Clinical Notes:  File: [id number]; File: [id number]

## 2015-05-27 ENCOUNTER — Encounter (HOSPITAL_COMMUNITY): Payer: 59

## 2015-05-30 ENCOUNTER — Encounter (HOSPITAL_COMMUNITY): Payer: 59

## 2015-06-01 ENCOUNTER — Encounter: Payer: Self-pay | Admitting: Physician Assistant

## 2015-06-01 ENCOUNTER — Encounter (INDEPENDENT_AMBULATORY_CARE_PROVIDER_SITE_OTHER): Payer: 59

## 2015-06-01 ENCOUNTER — Encounter (HOSPITAL_COMMUNITY): Payer: 59

## 2015-06-01 ENCOUNTER — Ambulatory Visit (INDEPENDENT_AMBULATORY_CARE_PROVIDER_SITE_OTHER): Payer: 59 | Admitting: Physician Assistant

## 2015-06-01 VITALS — BP 100/62 | HR 44 | Ht 67.0 in | Wt 202.2 lb

## 2015-06-01 DIAGNOSIS — R001 Bradycardia, unspecified: Secondary | ICD-10-CM | POA: Diagnosis not present

## 2015-06-01 DIAGNOSIS — I10 Essential (primary) hypertension: Secondary | ICD-10-CM | POA: Diagnosis not present

## 2015-06-01 DIAGNOSIS — I5041 Acute combined systolic (congestive) and diastolic (congestive) heart failure: Secondary | ICD-10-CM | POA: Diagnosis not present

## 2015-06-01 DIAGNOSIS — Z951 Presence of aortocoronary bypass graft: Secondary | ICD-10-CM

## 2015-06-01 DIAGNOSIS — Z79899 Other long term (current) drug therapy: Secondary | ICD-10-CM | POA: Diagnosis not present

## 2015-06-01 DIAGNOSIS — I2583 Coronary atherosclerosis due to lipid rich plaque: Secondary | ICD-10-CM

## 2015-06-01 DIAGNOSIS — I2510 Atherosclerotic heart disease of native coronary artery without angina pectoris: Secondary | ICD-10-CM

## 2015-06-01 DIAGNOSIS — I255 Ischemic cardiomyopathy: Secondary | ICD-10-CM

## 2015-06-01 LAB — BASIC METABOLIC PANEL
BUN: 32 mg/dL — ABNORMAL HIGH (ref 7–25)
CALCIUM: 8.9 mg/dL (ref 8.6–10.3)
CO2: 29 mmol/L (ref 20–31)
CREATININE: 1.54 mg/dL — AB (ref 0.70–1.25)
Chloride: 104 mmol/L (ref 98–110)
Glucose, Bld: 90 mg/dL (ref 65–99)
Potassium: 4.5 mmol/L (ref 3.5–5.3)
SODIUM: 141 mmol/L (ref 135–146)

## 2015-06-01 LAB — HEPATIC FUNCTION PANEL
ALBUMIN: 3.6 g/dL (ref 3.6–5.1)
ALK PHOS: 86 U/L (ref 40–115)
ALT: 25 U/L (ref 9–46)
AST: 23 U/L (ref 10–35)
BILIRUBIN TOTAL: 0.7 mg/dL (ref 0.2–1.2)
Bilirubin, Direct: 0.2 mg/dL (ref <=0.2)
Indirect Bilirubin: 0.5 mg/dL (ref 0.2–1.2)
TOTAL PROTEIN: 5.5 g/dL — AB (ref 6.1–8.1)

## 2015-06-01 LAB — LIPID PANEL
CHOL/HDL RATIO: 2.7 ratio (ref <=5.0)
CHOLESTEROL: 92 mg/dL — AB (ref 125–200)
HDL: 34 mg/dL — AB (ref >=40)
LDL Cholesterol: 48 mg/dL (ref <130)
TRIGLYCERIDES: 48 mg/dL (ref <150)
VLDL: 10 mg/dL (ref <30)

## 2015-06-01 LAB — TSH: TSH: 5.89 mIU/L — ABNORMAL HIGH (ref 0.40–4.50)

## 2015-06-01 LAB — BRAIN NATRIURETIC PEPTIDE: Brain Natriuretic Peptide: 1454.5 pg/mL — ABNORMAL HIGH (ref <100)

## 2015-06-01 MED ORDER — FUROSEMIDE 40 MG PO TABS: ORAL_TABLET | ORAL | Status: DC

## 2015-06-01 MED ORDER — POTASSIUM CHLORIDE ER 20 MEQ PO TBCR: 40.0000 meq | EXTENDED_RELEASE_TABLET | Freq: Every day | ORAL | Status: DC

## 2015-06-01 NOTE — Patient Instructions (Addendum)
Your physician has recommended you make the following change in your medication:  STOP the medications listed below.  1.) amiodarone  2.) lopressor ( metoprolol tartrate)  3.) the FUROSEMIDE has been increased to 80 mg in the morning and 40 mg in the evening.  4.) the potassium has been increased to 40 meq daily.  Your physician has recommended that you wear an event monitor. Event monitors are medical devices that record the heart's electrical activity. Doctors most often [place] these monitors to diagnose arrhythmias. Arrhythmias are problems with the speed or rhythm of the heartbeat. The monitor is a small, portable device. You can wear one while you do your normal daily activities. This is usually used to diagnose what is causing palpitations/syncope (passing out). This will be worn for 2 weeks.  Your physician recommends that you return for lab work Monday. June 5th.  Keep scheduled appointment with Dr Martinique next week.

## 2015-06-01 NOTE — Progress Notes (Addendum)
Patient ID: Ryan Ward, male   DOB: 1945/08/12, 70 y.o.   MRN: [id number]    Date:  06/01/2015   ID:  Ryan Ward, DOB 02-26-1945, MRN [id number]  PCP:  Ryan Downing, MD  Primary Cardiologist:  Ryan Ward   Chief Complaint  Patient presents with  . Hospitalization Follow-up    doing better, still weak  . Medical Clearance    wants to return back to work      History of Present Illness: Ryan Ward is a 70 y.o. male who suffered an inferior ST segment elevation myocardial infarction in December 2016 with catheterization revealing an occluded RCA and also severe LAD and circumflex disease. The RCA was successfully treated with a drug-eluting stent. Periprocedural course was , complicated by cardiogenic shock, ventricular fibrillation, and complete heart block. He also developed atrial fibrillation. During the same admission, he developed a right lower extremity DVT and right upper lobe pulmonary embolus. He was then placed on Xarelto. Ultimately, it was felt that he would require more complete revascularization given residual LAD disease. The circumflex was not felt to be amenable to PCI. He was followed in the outpatient setting and subsequently underwent relook catheterization in March which showed patency of the RCA stent with residual distal RPDA disease as well as proximal and mid LAD disease, and ostial circumflex disease. He underwent successful coronary artery bypass grafting 4. Postoperative course was complicated by atrial fibrillation which was treated successfully with amiodarone therapy. He was subsequently discharged on April 12 and says that immediately following discharge, he noted increased orthopnea and edema. His lasix was increased to twice a day.  As of April 27, when seen for follow-up, he had lost almost 20 lbs and was at his preop weight. His last 2-D echocardiogram was 03/22/2015 his ejection fraction was 30-35% with diffuse hypokinesis. Peak PA pressure was 40 mmHg.  Right atrium moderately dilated.  He is here for four-week evaluation. He reports feeling tired and fatigued. He had gone out of his house in [place] this past weekend and was having some maintenance issues because of this he couldn't find, or didn't remember to take his Lasix. His weight increased 12 pounds since May 8 these having orthopnea and significant lower extremity edema as well as scrotal edema.  He also reports that he was eating out and not watching his diet while he was down there. He restarted the Lasix 2 days later and then started weighing himself just yesterday.  He also had some dizziness  The patient currently denies nausea, vomiting, fever, chest pain,  PND, cough, congestion, abdominal pain, hematochezia, melena,  claudication.  Wt Readings from Last 3 Encounters:  06/01/15 202 lb 4 oz (91.74 kg)  05/09/15 190 lb (86.183 kg)  04/28/15 190 lb 9.6 oz (86.456 kg)     Past Medical History  Diagnosis Date  . OSA (obstructive sleep apnea)     a. not using CPAP at home, does have machine  . Hypertensive heart disease   . Hyperlipidemia   . CAD (coronary artery disease)     a. 12/07/14 Inf STEMI/PCI: LAD 85p/m, 82m/d, LCX 80ost, RCA 30p/m, 62m/d, 100d (4.0x20 Promus Premier DES). Hosp course complicated by VF arrest, CGS, CHB req temp wire; b. 03/2015 Cath: LAD 85p/m, 95d, LCx 80ost, RCA 30-40p/m/d, RPDA 75, EF 30-35%, Nl CO; c. 04/06/2015 CABGx4 (LIMA->LAD, VG->Diag, Seq VG->prox RPDA->mid RPDA.  . Paroxysmal atrial fibrillation ([place])     a. 12/2014 In setting of Inf MI->convertred spont.  . Pulmonary  embolism ([place])     a. 12/2014 R PT and peroneal vein DVT and RUL PE-->Xarelto x 3 mos.  . Asthma     VERY MILD  . Ischemic cardiomyopathy     a. 03/2015 Echo: EF 30-35%, diff HK, mild MR, mod dil RA, PASP 79mmHg.  [name] Chronic systolic CHF (congestive heart failure) ([place])     a. 03/2015 Echo: EF 30-35%, diff HK.    Current Outpatient Prescriptions  Medication Sig Dispense Refill    . aspirin EC 81 MG tablet Take 1 tablet (81 mg total) by mouth daily. 90 tablet 3  . atorvastatin (LIPITOR) 80 MG tablet Take 1 tablet (80 mg total) by mouth daily at 6 PM. (Patient taking differently: Take 80 mg by mouth at bedtime. ) 30 tablet 6  . Cholecalciferol (VITAMIN D3) 5000 UNITS TABS Take 5,000 Units by mouth daily.     . clopidogrel (PLAVIX) 75 MG tablet Take 1 tablet (75 mg total) by mouth daily. 30 tablet 1  . famotidine (PEPCID) 20 MG tablet Take 20 mg by mouth 2 (two) times daily.    . ferrous Q000111Q C-folic acid (TRINSICON / FOLTRIN) capsule Take 1 capsule by mouth 3 (three) times daily after meals. 90 capsule 1  . furosemide (LASIX) 40 MG tablet Take 2 tablets in the AM 1 tablet in the PM 90 tablet 3  . levothyroxine (SYNTHROID, LEVOTHROID) 50 MCG tablet Take 50 mcg by mouth 2 (two) times daily.    . Multiple Vitamin (MULTIVITAMIN WITH MINERALS) TABS tablet Take 1 tablet by mouth daily.    . Potassium Chloride ER 20 MEQ TBCR Take 40 mEq by mouth daily. 30 tablet 1  . PROAIR HFA 108 (90 Base) MCG/ACT inhaler     . traMADol (ULTRAM) 50 MG tablet Take 1-2 tablets (50-100 mg total) by mouth every 6 (six) hours as needed for moderate pain. 50 tablet 0   No current facility-administered medications for this visit.    Allergies:   No Known Allergies  Social History:  The patient  reports that he has never smoked. He has never used smokeless tobacco. He reports that he does not drink alcohol or use illicit drugs.   Family history:   Family History  Problem Relation Age of Onset  . Heart attack Father     ROS:  Please see the history of present illness.  All other systems reviewed and negative.   PHYSICAL EXAM: VS:  BP 100/62 mmHg  Pulse 44  Ht 5\' 7"  (1.702 m)  Wt 202 lb 4 oz (91.74 kg)  BMI 31.67 kg/m2 Well nourished, well developed, in no acute distress HEENT: Pupils are equal round react to light accommodation extraocular movements are intact.  Neck: no  JVDNo cervical lymphadenopathy. Cardiac: Regular rhythm, rate slow without murmurs rubs or gallops. Lungs:  Bilateral rales  Abd: soft, nontender, positive bowel sounds all quadrants, no hepatosplenomegaly Ext: Tense 2+ lower extremity edema.  2+ radial and dorsalis pedis pulses. Skin: warm and dry Neuro:  Grossly normal  EKG:  Sinus bradycardia rate 44 bpm   ASSESSMENT AND PLAN:  Problem List Items Addressed This Visit    Sinus bradycardia   Relevant Medications   furosemide (LASIX) 40 MG tablet   S/P CABG x 4   Ischemic cardiomyopathy   Relevant Medications   furosemide (LASIX) 40 MG tablet   HTN (hypertension)   Relevant Medications   furosemide (LASIX) 40 MG tablet   CAD (coronary artery disease)   Relevant  Medications   furosemide (LASIX) 40 MG tablet   Acute combined systolic and diastolic heart failure (HCC)   Relevant Medications   furosemide (LASIX) 40 MG tablet    Other Visit Diagnoses    Bradycardia    -  Primary    Relevant Orders    EKG 12-Lead    Cardiac event monitor    Polypharmacy        Relevant Orders    Basic metabolic panel      Mr. Dowdle presents for follow-up today. His weight is up 12 pounds which is likely from not taking Lasix for 2 days over the weekend and eating higher sodium content meals. He has significant tense lower extremity edema and orthopnea with rales on exam.  I've increased his Lasix to 80 mg in the morning and continued 40 mg in the afternoon. He had a basic metabolic panel prior to his office visit.  I will review that once the results. Also increased his potassium 40 daily. Blood pressures too low to allow for ACE or ARB,  Spironolactone.  Rest of the limiting his fluid intake to 1.5 L daily and also to continue to monitor his weight daily and eat a low-sodium diet. Check a basic metabolic panel on Monday, June 5.  He does have significant bradycardia as well discontinued his amiodarone and his metoprolol. He had a TSH drawn on  April 20 and was mildly elevated at 4.84. This was much lower than it was 2 weeks prior when it was 11.050. His levothyroxine was increased to 50 g daily by his primary doctor. I have ordered a two-week CardioNet monitor to make sure his rate is improving he is not having any heart block.  Follow-up next week on the seventh as scheduled with Dr. Martinique.  Patient will call prior to his office visit if he is not seeing improvement.  Mr. Fite prefers to try and diurese at home it to being admitted. I think this is okay.

## 2015-06-03 ENCOUNTER — Encounter (HOSPITAL_COMMUNITY): Payer: 59

## 2015-06-06 ENCOUNTER — Encounter (HOSPITAL_COMMUNITY): Payer: 59

## 2015-06-07 ENCOUNTER — Other Ambulatory Visit: Payer: Self-pay | Admitting: Student

## 2015-06-07 ENCOUNTER — Ambulatory Visit: Payer: 59 | Admitting: Cardiothoracic Surgery

## 2015-06-08 ENCOUNTER — Encounter: Payer: Self-pay | Admitting: Cardiology

## 2015-06-08 ENCOUNTER — Ambulatory Visit (INDEPENDENT_AMBULATORY_CARE_PROVIDER_SITE_OTHER): Payer: 59 | Admitting: Cardiology

## 2015-06-08 ENCOUNTER — Encounter (HOSPITAL_COMMUNITY): Payer: 59

## 2015-06-08 VITALS — BP 108/69 | HR 61 | Ht 67.0 in | Wt 182.6 lb

## 2015-06-08 DIAGNOSIS — E785 Hyperlipidemia, unspecified: Secondary | ICD-10-CM | POA: Diagnosis not present

## 2015-06-08 DIAGNOSIS — I2510 Atherosclerotic heart disease of native coronary artery without angina pectoris: Secondary | ICD-10-CM

## 2015-06-08 DIAGNOSIS — I5042 Chronic combined systolic (congestive) and diastolic (congestive) heart failure: Secondary | ICD-10-CM | POA: Diagnosis not present

## 2015-06-08 DIAGNOSIS — Z951 Presence of aortocoronary bypass graft: Secondary | ICD-10-CM | POA: Diagnosis not present

## 2015-06-08 DIAGNOSIS — I255 Ischemic cardiomyopathy: Secondary | ICD-10-CM

## 2015-06-08 MED ORDER — LISINOPRIL 2.5 MG PO TABS: 2.5000 mg | ORAL_TABLET | Freq: Every day | ORAL | Status: DC

## 2015-06-08 NOTE — Patient Instructions (Signed)
Continue your current therapy  Add lisinopril 2.5 mg daily  We will call with the results of your monitor.  We will schedule an Echocardiogram in one month.  We will schedule blood work in 2 weeks.  I will see you in 3 months.

## 2015-06-08 NOTE — Progress Notes (Signed)
Office Visit    Patient Name: Ryan Ward Date of Encounter: 06/08/2015  Primary Care Provider:  Leonard Downing, MD Primary Cardiologist:  P. Martinique, MD   Chief Complaint    70 year old male status post coronary artery bypass grafting, who presents for follow-up.  Past Medical History    Past Medical History  Diagnosis Date  . OSA (obstructive sleep apnea)     a. not using CPAP at home, does have machine  . Hypertensive heart disease   . Hyperlipidemia   . CAD (coronary artery disease)     a. 12/07/14 Inf STEMI/PCI: LAD 85p/m, 24m/d, LCX 80ost, RCA 30p/m, 15m/d, 100d (4.0x20 Promus Premier DES). Hosp course complicated by VF arrest, CGS, CHB req temp wire; b. 03/2015 Cath: LAD 85p/m, 95d, LCx 80ost, RCA 30-40p/m/d, RPDA 75, EF 30-35%, Nl CO; c. 04/06/2015 CABGx4 (LIMA->LAD, VG->Diag, Seq VG->prox RPDA->mid RPDA.  . Paroxysmal atrial fibrillation ([place])     a. 12/2014 In setting of Inf MI->convertred spont.  . Pulmonary embolism ([place])     a. 12/2014 R PT and peroneal vein DVT and RUL PE-->Xarelto x 3 mos.  . Asthma     VERY MILD  . Ischemic cardiomyopathy     a. 03/2015 Echo: EF 30-35%, diff HK, mild MR, mod dil RA, PASP 31mmHg.  [name] Chronic systolic CHF (congestive heart failure) ([place])     a. 03/2015 Echo: EF 30-35%, diff HK.   Past Surgical History  Procedure Laterality Date  . Hernia repair    . Cardiac catheterization N/A 12/07/2014    Procedure: Coronary Stent Intervention;  Surgeon: Skylur Fuston M Martinique, MD;  Location: [facility];  Service: Cardiovascular;  Laterality: N/A;  STEMI  . Cardiac catheterization N/A 12/07/2014    Procedure: IABP Insertion;  Surgeon: Burech Mcfarland M Martinique, MD;  Location: [facility];  Service: Cardiovascular;  Laterality: N/A;  . Cardiac catheterization N/A 12/07/2014    Procedure: Temporary Pacemaker;  Surgeon: Yaretsi Humphres M Martinique, MD;  Location: [place] [place] [facility];  Service: Cardiovascular;  Laterality: N/A;  . Cardiac catheterization N/A  12/07/2014    Procedure: Left Heart Cath and Coronary Angiography;  Surgeon: Bora Broner M Martinique, MD;  Location: [facility] [facility];  Service: Cardiovascular;  Laterality: N/A;  . Cardiac catheterization N/A 03/29/2015    Procedure: Right/Left Heart Cath and Coronary Angiography;  Surgeon: Helen Cuff M Martinique, MD;  Location: [facility];  Service: Cardiovascular;  Laterality: N/A;  . Tonsillectomy    . Coronary artery bypass graft N/A 04/06/2015    Procedure: CORONARY ARTERY BYPASS GRAFT TIMES FOUR  USING LEFT INTERNAL MAMMARY ARTERY TO THE LEFT ANTERIOR DESCENDING, BILATERAL GREATER SAPHENOUS ENDOVEIN HARVEST GRAFT TO PROXIMAL AND MID POSTERIOR DESCENDING AND DIAGONAL CORONARY ARTERIES AND PLACEMENT OR RIGHT FEMORAL A-LINE. ;  Surgeon: [name], MD;  Location: [facility];  Service: Open Heart Surgery;  Laterality: N/A;  . Tee without cardioversion N/A 04/06/2015    Procedure: TRANSESOPHAGEAL ECHOCARDIOGRAM (TEE);  Surgeon: [name], MD;  Location: [facility];  Service: Open Heart Surgery;  Laterality: N/A;    Allergies  No Known Allergies  History of Present Illness    70 year old male with the above complex past medical history. He suffered an inferior ST segment elevation myocardial infarction in December 2016 with catheterization revealing an occluded RCA and also severe LAD and circumflex disease. The RCA was successfully treated with a drug-eluting stent. Periprocedural course was complicated by cardiogenic shock, ventricular fibrillation, and complete heart  block. He also developed atrial fibrillation. During the same admission, he developed a right lower extremity DVT and right upper lobe pulmonary embolus. He was then placed on Xarelto and completed a course of anticoagulation.  Ultimately, it was felt that he would require more complete revascularization given residual LAD disease. The circumflex was not felt to be amenable to PCI. He underwent relook catheterization in March which showed  patency of the RCA stent with residual distal RPDA disease as well as proximal and mid LAD disease, and ostial circumflex disease. Patient  underwent successful coronary artery bypass grafting 4. Postoperative course was complicated by atrial fibrillation which was treated successfully with amiodarone therapy. Post DC he developed CHF and was treated with increased dose of lasix. On his last visit he was noted to be markedly bradycardic and metoprolol and amiodarone were discontinued. On follow up today he feels much better. His weight is down by 30 lbs and back to his preop weight. He has started back to work full time. He is scheduled to start cardiac Rehab next week. He does still have LE edema but denies dyspnea or chest pain. Energy level is improved.   Home Medications    Prior to Admission medications   Medication Sig Start Date End Date Taking? Authorizing Provider  amiodarone (PACERONE) 400 MG tablet Take 1 tablet (400 mg total) by mouth 2 (two) times daily. For 1 week, then 400 mg once daily for 2 weeks, then 200 mg daily. 04/13/15  Yes Wayne E Gold, PA-C  aspirin EC 81 MG tablet Take 1 tablet (81 mg total) by mouth daily. 03/24/15  Yes Karalyne Nusser M Martinique, MD  atorvastatin (LIPITOR) 80 MG tablet Take 1 tablet (80 mg total) by mouth daily at 6 PM. Patient taking differently: Take 80 mg by mouth at bedtime.  12/15/14  Yes Erma Heritage, PA  Cholecalciferol (VITAMIN D3) 5000 UNITS TABS Take 5,000 Units by mouth daily.    Yes Historical Provider, MD  clopidogrel (PLAVIX) 75 MG tablet Take 1 tablet (75 mg total) by mouth daily. 04/13/15  Yes Wayne E Gold, PA-C  ferrous Q000111Q C-folic acid (TRINSICON / FOLTRIN) capsule Take 1 capsule by mouth 3 (three) times daily after meals. 04/13/15  Yes Wayne E Gold, PA-C  furosemide (LASIX) 40 MG tablet Take 1 tablet (40 mg total) by mouth 2 (two) times daily. 04/18/15  Yes Courtland Reas M Martinique, MD  levothyroxine (SYNTHROID, LEVOTHROID) 25 MCG tablet  Take 1 tablet (25 mcg total) by mouth daily before breakfast. 04/13/15  Yes Wayne E Gold, PA-C  metoprolol tartrate (LOPRESSOR) 25 MG tablet Take 1 tablet by mouth 2 (two) times daily. 01/17/15  Yes Historical Provider, MD  Multiple Vitamin (MULTIVITAMIN WITH MINERALS) TABS tablet Take 1 tablet by mouth daily.   Yes Historical Provider, MD  potassium chloride 20 MEQ TBCR Take 20 mEq by mouth daily. 04/13/15  Yes John Giovanni, PA-C  PROAIR HFA 108 [phone] Base) MCG/ACT inhaler  02/25/15  Yes Historical Provider, MD  traMADol (ULTRAM) 50 MG tablet Take 1-2 tablets (50-100 mg total) by mouth every 6 (six) hours as needed for moderate pain. 04/13/15  Yes John Giovanni, PA-C    Review of Systems    As noted in HPI. All other systems reviewed and are otherwise negative except as noted above.  Physical Exam    VS:  BP 108/69 mmHg  Pulse 61  Ht 5\' 7"  (1.702 m)  Wt 182 lb 9.6 oz (82.827 kg)  BMI  28.59 kg/m2 , BMI Body mass index is 28.59 kg/(m^2). GEN: Well nourished, well developed, in no acute distress. HEENT: normal. Neck: Supple, no JVD, carotid bruits, or masses. Cardiac: RRR, no murmurs, rubs, or gallops. No clubbing, cyanosis, 1-2+ bilateral lower extremity edema to the knee. Radials/DP/PT 2+ and equal bilaterally.  Respiratory:  Respirations regular and unlabored, clear to auscultation bilaterally. GI: Soft, nontender, nondistended, BS + x 4. MS: no deformity or atrophy. Skin: warm and dry, no rash. Neuro:  Strength and sensation are intact. Psych: Normal affect.  Accessory Clinical Findings    ECG - today shows NSR with rate 61. LAD, low voltage, incomplete RBBB. Old inferior MI. I have personally reviewed and interpreted this study.   Lab Results  Component Value Date   WBC 11.2* 04/12/2015   HGB 9.0* 04/12/2015   HCT 27.1* 04/12/2015   PLT 276 04/12/2015   GLUCOSE 90 06/01/2015   CHOL 92* 06/01/2015   TRIG 48 06/01/2015   HDL 34* 06/01/2015   LDLCALC 48 06/01/2015   ALT 25  06/01/2015   AST 23 06/01/2015   NA 141 06/01/2015   K 4.5 06/01/2015   CL 104 06/01/2015   CREATININE 1.54* 06/01/2015   BUN 32* 06/01/2015   CO2 29 06/01/2015   TSH 5.89* 06/01/2015   INR 1.64* 04/06/2015   HGBA1C 5.9* 12/07/2014     Assessment & Plan    1.  Coronary artery disease: Status post coronary artery bypass grafting 4 . Prior inferior STEMi treated with DES.  Continue aspirin, statin, Plavix, and beta blocker therapy.  2. Ischemic cardiopathy/chronic systolic congestive heart failure: EF 30-35% by echo and cath in March 2017. He  has improved with titration of his Lasix. Weight is back to pre op weight but he still has some LE edema. Recommend he continue current diuretic dose. Beta blocker discontinued due to marked bradycardia. Will start lisinopril 2.5 mg daily. Repeat BMET and BNP in 2 weeks.   He will require follow-up echo 3 months post revascularization to determine candidacy for ICD therapy. We will schedule one month from now.   3. Postoperative atrial fibrillation/prolonged QT: He is maintaining NSR off amiodarone. QTc is now normal. Will monitor.   4. Marked sinus bradycardia- improved. Currently wearing an event monitor. Will review once tracings available.   5. Hyperlipidemia: Continue high potency statin therapy.  6.  H/o DVT/PE:  He has completed a 3 month course of xarelto.  7. Dispo:   f/u with me in3 months.  Maybree Riling Martinique, MD,FACC  06/08/2015, 4:44 PM

## 2015-06-09 ENCOUNTER — Ambulatory Visit: Payer: 59 | Admitting: Cardiothoracic Surgery

## 2015-06-10 ENCOUNTER — Encounter (HOSPITAL_COMMUNITY): Payer: 59

## 2015-06-10 ENCOUNTER — Telehealth: Payer: Self-pay | Admitting: Cardiology

## 2015-06-10 MED ORDER — POTASSIUM CHLORIDE ER 20 MEQ PO TBCR: 40.0000 meq | EXTENDED_RELEASE_TABLET | Freq: Every day | ORAL | Status: DC

## 2015-06-10 NOTE — Telephone Encounter (Signed)
New message       *STAT* If patient is at the pharmacy, call can be transferred to refill team.   1. Which medications need to be refilled? (please list name of each medication and dose if known)  Potassium 83meq 2. Which pharmacy/location (including street and city if local pharmacy) is medication to be sent to? [facility] [address]. Do they need a 30 day or 90 day supply? 90 day Presc was to be called in 06-01-15.  Drug store never received presc.

## 2015-06-10 NOTE — Telephone Encounter (Signed)
rx sent

## 2015-06-13 ENCOUNTER — Encounter (HOSPITAL_COMMUNITY): Payer: 59

## 2015-06-13 ENCOUNTER — Telehealth: Payer: Self-pay | Admitting: Cardiology

## 2015-06-13 NOTE — Telephone Encounter (Signed)
New message    Pt is callnig to speak to rn   He wants to have FMLA forms filled out

## 2015-06-14 ENCOUNTER — Encounter (HOSPITAL_COMMUNITY)
Admission: RE | Admit: 2015-06-14 | Discharge: 2015-06-14 | Disposition: A | Discharge status: Home / Self Care | Payer: 59 | Source: Ambulatory Visit | Attending: Cardiology | Admitting: Cardiology

## 2015-06-14 ENCOUNTER — Encounter (HOSPITAL_COMMUNITY): Payer: Self-pay

## 2015-06-14 VITALS — BP 104/60 | HR 64 | Ht 67.0 in | Wt 179.7 lb

## 2015-06-14 DIAGNOSIS — Z955 Presence of coronary angioplasty implant and graft: Secondary | ICD-10-CM | POA: Insufficient documentation

## 2015-06-14 DIAGNOSIS — Z951 Presence of aortocoronary bypass graft: Secondary | ICD-10-CM

## 2015-06-14 DIAGNOSIS — I213 ST elevation (STEMI) myocardial infarction of unspecified site: Secondary | ICD-10-CM | POA: Diagnosis present

## 2015-06-14 NOTE — Telephone Encounter (Signed)
Returned call to patient no answer.LMTC. 

## 2015-06-15 ENCOUNTER — Other Ambulatory Visit: Payer: Self-pay | Admitting: Cardiothoracic Surgery

## 2015-06-15 ENCOUNTER — Encounter (HOSPITAL_COMMUNITY): Payer: 59

## 2015-06-15 DIAGNOSIS — Z951 Presence of aortocoronary bypass graft: Secondary | ICD-10-CM

## 2015-06-15 NOTE — Telephone Encounter (Signed)
Spoke to patient.He stated he will bring FMLA form for Dr.Jordan to sign tomorrow.Advised ok to bring form,but Dr.Jordan will be out of office until 06/27/15.I will call you after I get his signature.

## 2015-06-15 NOTE — Progress Notes (Signed)
Cardiac Individual Treatment Plan  Patient Details  Name: Ryan Ward MRN: [id number] Date of Birth: 1945/01/18 Referring Provider:        CARDIAC REHAB PHASE II ORIENTATION from 06/14/2015 in [place]   Referring Provider  Martinique, Peter MD      Initial Encounter Date:       CARDIAC REHAB PHASE II ORIENTATION from 06/14/2015 in [place]   Date  06/14/15   Referring Provider  Martinique, Peter MD      Visit Diagnosis: S/P CABG (coronary artery bypass graft)  Patient's Home Medications on Admission:  Current outpatient prescriptions:  .  aspirin EC 81 MG tablet, Take 1 tablet (81 mg total) by mouth daily., Disp: 90 tablet, Rfl: 3 .  atorvastatin (LIPITOR) 80 MG tablet, TAKE 1 TABLET (80 MG TOTAL) BY MOUTH DAILY AT 6 PM., Disp: 30 tablet, Rfl: 2 .  Cholecalciferol (VITAMIN D3) 5000 UNITS TABS, Take 5,000 Units by mouth daily. , Disp: , Rfl:  .  clopidogrel (PLAVIX) 75 MG tablet, Take 1 tablet (75 mg total) by mouth daily., Disp: 30 tablet, Rfl: 1 .  famotidine (PEPCID) 20 MG tablet, Take 20 mg by mouth 2 (two) times daily., Disp: , Rfl:  .  ferrous Q000111Q C-folic acid (TRINSICON / FOLTRIN) capsule, Take 1 capsule by mouth 3 (three) times daily after meals., Disp: 90 capsule, Rfl: 1 .  furosemide (LASIX) 40 MG tablet, Take 2 tablets in the AM 1 tablet in the PM, Disp: 90 tablet, Rfl: 3 .  levothyroxine (SYNTHROID, LEVOTHROID) 50 MCG tablet, Take 50 mcg by mouth 2 (two) times daily., Disp: , Rfl:  .  lisinopril (PRINIVIL,ZESTRIL) 2.5 MG tablet, Take 1 tablet (2.5 mg total) by mouth daily., Disp: 30 tablet, Rfl: 11 .  Multiple Vitamin (MULTIVITAMIN WITH MINERALS) TABS tablet, Take 1 tablet by mouth daily., Disp: , Rfl:  .  Potassium Chloride ER 20 MEQ TBCR, Take 40 mEq by mouth daily., Disp: 90 tablet, Rfl: 1 .  PROAIR HFA 108 (90 Base) MCG/ACT inhaler, , Disp: , Rfl:  .  traMADol (ULTRAM) 50 MG tablet, Take 1-2  tablets (50-100 mg total) by mouth every 6 (six) hours as needed for moderate pain., Disp: 50 tablet, Rfl: 0  Past Medical History: Past Medical History  Diagnosis Date  . OSA (obstructive sleep apnea)     a. not using CPAP at home, does have machine  . Hypertensive heart disease   . Hyperlipidemia   . CAD (coronary artery disease)     a. 12/07/14 Inf STEMI/PCI: LAD 85p/m, 37m/d, LCX 80ost, RCA 30p/m, 82m/d, 100d (4.0x20 Promus Premier DES). Hosp course complicated by VF arrest, CGS, CHB req temp wire; b. 03/2015 Cath: LAD 85p/m, 95d, LCx 80ost, RCA 30-40p/m/d, RPDA 75, EF 30-35%, Nl CO; c. 04/06/2015 CABGx4 (LIMA->LAD, VG->Diag, Seq VG->prox RPDA->mid RPDA.  . Paroxysmal atrial fibrillation ([place])     a. 12/2014 In setting of Inf MI->convertred spont.  . Pulmonary embolism ([place])     a. 12/2014 R PT and peroneal vein DVT and RUL PE-->Xarelto x 3 mos.  . Asthma     VERY MILD  . Ischemic cardiomyopathy     a. 03/2015 Echo: EF 30-35%, diff HK, mild MR, mod dil RA, PASP 76mmHg.  [name] Chronic systolic CHF (congestive heart failure) ([place])     a. 03/2015 Echo: EF 30-35%, diff HK.    Tobacco Use: History  Smoking status  . Never Smoker  Smokeless tobacco  . Never Used    Labs: Recent Review Flowsheet Data    Labs for ITP Cardiac and Pulmonary Rehab Latest Ref Rng 04/09/2015 04/09/2015 04/10/2015 04/11/2015 06/01/2015   Cholestrol 125 - 200 mg/dL - - - - 92(L)   LDLCALC <130 mg/dL - - - - 48   HDL >=40 mg/dL - - - - 34(L)   Trlycerides <150 mg/dL - - - - 48   TCO2 0 - 100 mmol/L - 29 30 - -   O2SAT - 57.1 - 42.8 63.1 -      Capillary Blood Glucose: Lab Results  Component Value Date   GLUCAP 95 04/13/2015   GLUCAP 81 04/13/2015   GLUCAP 104* 04/12/2015   GLUCAP 86 04/12/2015   GLUCAP 89 04/12/2015     Exercise Target Goals: Date: 06/14/15  Exercise Program Goal: Individual exercise prescription set with THRR, safety & activity barriers. Participant demonstrates ability to understand  and report RPE using BORG scale, to self-measure pulse accurately, and to acknowledge the importance of the exercise prescription.  Exercise Prescription Goal: Starting with aerobic activity 30 plus minutes a day, 3 days per week for initial exercise prescription. Provide home exercise prescription and guidelines that participant acknowledges understanding prior to discharge.  Activity Barriers & Risk Stratification:     Activity Barriers & Cardiac Risk Stratification - 06/14/15 1712    Activity Barriers & Cardiac Risk Stratification   Activity Barriers None   Cardiac Risk Stratification High      6 Minute Walk:     6 Minute Walk      06/14/15 1713       6 Minute Walk   Phase Initial     Distance 1639 feet     Walk Time 6 minutes     # of Rest Breaks 0     MPH 3.1     METS 3.3     RPE 13     VO2 Peak 11.56     Symptoms No     Resting HR 64 bpm     Resting BP 104/60 mmHg     Max Ex. HR 89 bpm     Max Ex. BP 116/68 mmHg     2 Minute Post BP 110/60 mmHg        Initial Exercise Prescription:     Initial Exercise Prescription - 06/15/15 0800    Date of Initial Exercise RX and Referring Provider   Date 06/14/15   Referring Provider Martinique, Peter MD   Treadmill   MPH 2.5   Grade 1   Minutes 10   METs 3.26   Bike   Level 1   Minutes 10   METs 3.3   NuStep   Level 3   Minutes 10   METs 2.5   Prescription Details   Frequency (times per week) 3   Duration Progress to 30 minutes of continuous aerobic without signs/symptoms of physical distress   Intensity   THRR 40-80% of Max Heartrate 60-121   Ratings of Perceived Exertion 11-13   Progression   Progression Continue to progress workloads to maintain intensity without signs/symptoms of physical distress.   Resistance Training   Training Prescription Yes   Weight 2 lb   Reps 10-12      Perform Capillary Blood Glucose checks as needed.  Exercise Prescription Changes:   Exercise Comments:   Discharge  Exercise Prescription (Final Exercise Prescription Changes):   Nutrition:  Target Goals: Understanding of nutrition guidelines,  daily intake of sodium 1500mg , cholesterol 200mg , calories 30% from fat and 7% or less from saturated fats, daily to have 5 or more servings of fruits and vegetables.  Biometrics:     Pre Biometrics - 06/14/15 1711    Pre Biometrics   Height 5\' 7"  (1.702 m)   Weight 179 lb 10.8 oz (81.5 kg)   Waist Circumference 36.75 inches   Hip Circumference 40 inches   Waist to Hip Ratio 0.92 %   BMI (Calculated) 28.2   Triceps Skinfold 13 mm   % Body Fat 25.7 %   Grip Strength 48.5 kg   Flexibility 9 in   Single Leg Stand 30 seconds       Nutrition Therapy Plan and Nutrition Goals:   Nutrition Discharge: Nutrition Scores:   Nutrition Goals Re-Evaluation:   Psychosocial: Target Goals: Acknowledge presence or absence of depression, maximize coping skills, provide positive support system. Participant is able to verbalize types and ability to use techniques and skills needed for reducing stress and depression.  Initial Review & Psychosocial Screening:     Initial Psych Review & Screening - 06/14/15 [place]? Yes   Barriers   Psychosocial barriers to participate in program The patient should benefit from training in stress management and relaxation.   Screening Interventions   Interventions Encouraged to exercise      Quality of Life Scores:     Quality of Life - 06/14/15 1713    Quality of Life Scores   Health/Function Pre 24.7 %   Socioeconomic Pre 28.07 %   Psych/Spiritual Pre 25 %   Family Pre 28.8 %   GLOBAL Pre 26.06 %      PHQ-9:     Recent Review Flowsheet Data    Depression screen PHQ 2/9 02/02/2015   Decreased Interest 0   Down, Depressed, Hopeless 0   PHQ - 2 Score 0      Psychosocial Evaluation and Intervention:   Psychosocial Re-Evaluation:   Vocational Rehabilitation: Provide  vocational rehab assistance to qualifying candidates.   Vocational Rehab Evaluation & Intervention:   Education: Education Goals: Education classes will be provided on a weekly basis, covering required topics. Participant will state understanding/return demonstration of topics presented.  Learning Barriers/Preferences:     Learning Barriers/Preferences - 06/14/15 1634    Learning Barriers/Preferences   Learning Barriers None;Reading   Learning Preferences Skilled Demonstration      Education Topics: Count Your Pulse:  -Group instruction provided by verbal instruction, demonstration, patient participation and written materials to support subject.  Instructors address importance of being able to find your pulse and how to count your pulse when at home without a heart monitor.  Patients get hands on experience counting their pulse with staff help and individually.   Heart Attack, Angina, and Risk Factor Modification:  -Group instruction provided by verbal instruction, video, and written materials to support subject.  Instructors address signs and symptoms of angina and heart attacks.    Also discuss risk factors for heart disease and how to make changes to improve heart health risk factors.   Functional Fitness:  -Group instruction provided by verbal instruction, demonstration, patient participation, and written materials to support subject.  Instructors address safety measures for doing things around the house.  Discuss how to get up and down off the floor, how to pick things up properly, how to safely get out of a chair without assistance, and balance training.  Meditation and Mindfulness:  -Group instruction provided by verbal instruction, patient participation, and written materials to support subject.  Instructor addresses importance of mindfulness and meditation practice to help reduce stress and improve awareness.  Instructor also leads participants through a meditation exercise.     Stretching for Flexibility and Mobility:  -Group instruction provided by verbal instruction, patient participation, and written materials to support subject.  Instructors lead participants through series of stretches that are designed to increase flexibility thus improving mobility.  These stretches are additional exercise for major muscle groups that are typically performed during regular warm up and cool down.   Hands Only CPR Anytime:  -Group instruction provided by verbal instruction, video, patient participation and written materials to support subject.  Instructors co-teach with AHA video for hands only CPR.  Participants get hands on experience with mannequins.   Nutrition I class: Heart Healthy Eating:  -Group instruction provided by PowerPoint slides, verbal discussion, and written materials to support subject matter. The instructor gives an explanation and review of the Therapeutic Lifestyle Changes diet recommendations, which includes a discussion on lipid goals, dietary fat, sodium, fiber, plant stanol/sterol esters, sugar, and the components of a well-balanced, healthy diet.   Nutrition II class: Lifestyle Skills:  -Group instruction provided by PowerPoint slides, verbal discussion, and written materials to support subject matter. The instructor gives an explanation and review of label reading, grocery shopping for heart health, heart healthy recipe modifications, and ways to make healthier choices when eating out.   Diabetes Question & Answer:  -Group instruction provided by PowerPoint slides, verbal discussion, and written materials to support subject matter. The instructor gives an explanation and review of diabetes co-morbidities, pre- and post-prandial blood glucose goals, pre-exercise blood glucose goals, signs, symptoms, and treatment of hypoglycemia and hyperglycemia, and foot care basics.   Diabetes Blitz:  -Group instruction provided by PowerPoint slides, verbal  discussion, and written materials to support subject matter. The instructor gives an explanation and review of the physiology behind type 1 and type 2 diabetes, diabetes medications and rational behind using different medications, pre- and post-prandial blood glucose recommendations and Hemoglobin A1c goals, diabetes diet, and exercise including blood glucose guidelines for exercising safely.    Portion Distortion:  -Group instruction provided by PowerPoint slides, verbal discussion, written materials, and food models to support subject matter. The instructor gives an explanation of serving size versus portion size, changes in portions sizes over the last 20 years, and what consists of a serving from each food group.   Stress Management:  -Group instruction provided by verbal instruction, video, and written materials to support subject matter.  Instructors review role of stress in heart disease and how to cope with stress positively.     Exercising on Your Own:  -Group instruction provided by verbal instruction, power point, and written materials to support subject.  Instructors discuss benefits of exercise, components of exercise, frequency and intensity of exercise, and end points for exercise.  Also discuss use of nitroglycerin and activating EMS.  Review options of places to exercise outside of rehab.  Review guidelines for sex with heart disease.   Cardiac Drugs I:  -Group instruction provided by verbal instruction and written materials to support subject.  Instructor reviews cardiac drug classes: antiplatelets, anticoagulants, beta blockers, and statins.  Instructor discusses reasons, side effects, and lifestyle considerations for each drug class.   Cardiac Drugs II:  -Group instruction provided by verbal instruction and written materials to support subject.  Instructor reviews cardiac  drug classes: angiotensin converting enzyme inhibitors (ACE-I), angiotensin II receptor blockers (ARBs),  nitrates, and calcium channel blockers.  Instructor discusses reasons, side effects, and lifestyle considerations for each drug class.   Anatomy and Physiology of the Circulatory System:  -Group instruction provided by verbal instruction, video, and written materials to support subject.  Reviews functional anatomy of heart, how it relates to various diagnoses, and what role the heart plays in the overall system.   Knowledge Questionnaire Score:     Knowledge Questionnaire Score - 06/14/15 1712    Knowledge Questionnaire Score   Pre Score 22/24      Core Components/Risk Factors/Patient Goals at Admission:     Personal Goals and Risk Factors at Admission - 06/14/15 1634    Core Components/Risk Factors/Patient Goals on Admission   Sedentary Yes   Intervention Provide advice, education, support and counseling about physical activity/exercise needs.;Develop an individualized exercise prescription for aerobic and resistive training based on initial evaluation findings, risk stratification, comorbidities and participant's personal goals.   Expected Outcomes Achievement of increased cardiorespiratory fitness and enhanced flexibility, muscular endurance and strength shown through measurements of functional capacity and personal statement of participant.   Increase Strength and Stamina Yes   Intervention Provide advice, education, support and counseling about physical activity/exercise needs.;Develop an individualized exercise prescription for aerobic and resistive training based on initial evaluation findings, risk stratification, comorbidities and participant's personal goals.   Expected Outcomes Achievement of increased cardiorespiratory fitness and enhanced flexibility, muscular endurance and strength shown through measurements of functional capacity and personal statement of participant.      Core Components/Risk Factors/Patient Goals Review:    Core Components/Risk Factors/Patient Goals  at Discharge (Final Review):    ITP Comments:     ITP Comments      03/16/15 0901 06/14/15 1546         ITP Comments see paper chart for previous documentation    Dr. Fransico Him, MD Medical Director Dr. Fransico Him, Medical Director          Comments: Patient attended orientation from 1330 to 1530 to review rules and guidelines for program. Completed 6 minute walk test, Intitial ITP, and exercise prescription.  VSS. Telemetry-sinus rhythm.  Asymptomatic.

## 2015-06-16 ENCOUNTER — Ambulatory Visit
Admission: RE | Admit: 2015-06-16 | Discharge: 2015-06-16 | Disposition: A | Discharge status: Home / Self Care | Payer: 59 | Source: Ambulatory Visit | Attending: Cardiothoracic Surgery | Admitting: Cardiothoracic Surgery

## 2015-06-16 ENCOUNTER — Encounter: Payer: Self-pay | Admitting: Cardiothoracic Surgery

## 2015-06-16 ENCOUNTER — Ambulatory Visit (INDEPENDENT_AMBULATORY_CARE_PROVIDER_SITE_OTHER): Payer: Self-pay | Admitting: Cardiothoracic Surgery

## 2015-06-16 VITALS — BP 84/56 | HR 63 | Resp 16 | Ht 67.0 in | Wt 182.0 lb

## 2015-06-16 DIAGNOSIS — Z951 Presence of aortocoronary bypass graft: Secondary | ICD-10-CM

## 2015-06-16 DIAGNOSIS — I213 ST elevation (STEMI) myocardial infarction of unspecified site: Secondary | ICD-10-CM

## 2015-06-16 DIAGNOSIS — I2510 Atherosclerotic heart disease of native coronary artery without angina pectoris: Secondary | ICD-10-CM

## 2015-06-16 NOTE — Progress Notes (Signed)
[address]       ,[place] [address]             [phone]                  Ryan Ward Medical Record [id number] Date of Birth: 03-21-1945  Referring [id number], [name], MD Primary Cardiology: Primary Care:ELKINS,WILSON [name], MD  Chief Complaint:  Follow Up Visit  DATE OF PROCEDURE: 04/06/2015 OPERATIVE REPORT PREOPERATIVE DIAGNOSIS: Coronary occlusive disease with severe left ventricular dysfunction. POSTOPERATIVE DIAGNOSIS: Coronary occlusive disease with severe left ventricular dysfunction. PROCEDURE PERFORMED: Coronary artery bypass grafting x4 with the left internal mammary to the left anterior descending coronary artery, reverse saphenous vein graft to the diagonal coronary artery, sequential reverse saphenous vein graft to the proximal and mid posterior descending coronary artery with bilateral greater saphenous via vein harvesting endoscopically and insertion of right femoral arterial line. SURGEON: Lanelle Bal, MD    History of Present Illness:    The patient is a 70 year old male who returns today after recent coronary artery bypass grafting. Currently he states that he is feeling well, since stopping his beta blocker he notes that he feels much better he is now on ACE inhibitor. He denies pain. He denies shortness of breath. He remains on diuretic, notes that when he skips any doses does get edema in his lower extremities. He is known to have severe left ventricular dysfunction preoperatively. He is ambulating well and he feels as though his endurance is improving over time. He denies fevers, chills or other significant constitutional symptoms.    Zubrod Score: At the time of surgery this patient's most appropriate activity status/level should be described as: []     0    Normal activity, no symptoms [x]     1    Restricted in physical strenuous activity but ambulatory, able to do out light work []     2     Ambulatory and capable of self care, unable to do work activities, up and about                 >50 % of waking hours                                                                                   []     3    Only limited self care, in bed greater than 50% of waking hours []     4    Completely disabled, no self care, confined to bed or chair []     5    Moribund  History  Smoking status  . Never Smoker   Smokeless tobacco  . Never Used       No Known Allergies  Current Outpatient Prescriptions  Medication Sig Dispense Refill  . aspirin EC 81 MG tablet Take 1 tablet (81 mg total) by mouth daily. 90 tablet 3  . atorvastatin (LIPITOR) 80 MG tablet TAKE 1 TABLET (80 MG TOTAL) BY MOUTH DAILY AT 6 PM. 30 tablet 2  . Cholecalciferol (VITAMIN D3) 5000 UNITS TABS Take 5,000 Units by mouth daily.     . clopidogrel (PLAVIX) 75  MG tablet Take 1 tablet (75 mg total) by mouth daily. 30 tablet 1  . famotidine (PEPCID) 20 MG tablet Take 20 mg by mouth 2 (two) times daily.    . ferrous Q000111Q C-folic acid (TRINSICON / FOLTRIN) capsule Take 1 capsule by mouth 3 (three) times daily after meals. 90 capsule 1  . furosemide (LASIX) 40 MG tablet Take 2 tablets in the AM 1 tablet in the PM 90 tablet 3  . levothyroxine (SYNTHROID, LEVOTHROID) 50 MCG tablet Take 50 mcg by mouth 2 (two) times daily.    [name] lisinopril (PRINIVIL,ZESTRIL) 2.5 MG tablet Take 1 tablet (2.5 mg total) by mouth daily. 30 tablet 11  . Multiple Vitamin (MULTIVITAMIN WITH MINERALS) TABS tablet Take 1 tablet by mouth daily.    . Potassium Chloride ER 20 MEQ TBCR Take 40 mEq by mouth daily. 90 tablet 1  . PROAIR HFA 108 (90 Base) MCG/ACT inhaler      No current facility-administered medications for this visit.       Physical Exam: BP 84/56 mmHg  Pulse 63  Resp 16  Ht 5\' 7"  (1.702 m)  Wt 182 lb (82.555 kg)  BMI 28.50 kg/m2  SpO2 98%  General appearance: alert, cooperative and no distress Heart: regular rate and  rhythm and S1, S2 normal Lungs: Mildly diminished in the bases Abdomen: soft, non-tender; bowel sounds normal; no masses,  no organomegaly Extremities: 2 plus pitting edema Wounds: Incisions are all healing well without evidence of infection  Diagnostic Studies & Laboratory data:         Recent Radiology Findings: Dg Chest 2 View  06/16/2015  CLINICAL DATA:  Patient underwent CABG surgery in April 2017. No current complaints. EXAM: CHEST  2 VIEW COMPARISON:  05/09/2015 FINDINGS: Changes from the recent prior CABG surgery are stable. Cardiac silhouette is normal in size and configuration. Normal mediastinal and hilar contours. The lungs are clear. Lung base atelectasis and small effusions noted on the prior study have resolved. No pneumothorax. Skeletal structures are intact. IMPRESSION: No acute cardiopulmonary disease. Electronically Signed   By: Lajean Manes M.D.   On: 06/16/2015 08:39      I have independently reviewed the above radiology findings and reviewed findings  with the patient.  Recent Labs: Lab Results  Component Value Date   WBC 11.2* 04/12/2015   HGB 9.0* 04/12/2015   HCT 27.1* 04/12/2015   PLT 276 04/12/2015   GLUCOSE 90 06/01/2015   CHOL 92* 06/01/2015   TRIG 48 06/01/2015   HDL 34* 06/01/2015   LDLCALC 48 06/01/2015   ALT 25 06/01/2015   AST 23 06/01/2015   NA 141 06/01/2015   K 4.5 06/01/2015   CL 104 06/01/2015   CREATININE 1.54* 06/01/2015   BUN 32* 06/01/2015   CO2 29 06/01/2015   TSH 5.89* 06/01/2015   INR 1.64* 04/06/2015   HGBA1C 5.9* 12/07/2014      Assessment / Plan:  The patient is recovering nicely from his CABG. He does have some ongoing issues related to congestive failure/ventricular dysfunction. He is being followed closely by cardiology.  He is made good progress postoperatively, will continue to need treatment for congestive heart failure. He is to start cardiac rehabilitation next week. We'll be seen in the surgery office as  needed    [name] 06/16/2015 10:02 AM

## 2015-06-17 ENCOUNTER — Encounter (HOSPITAL_COMMUNITY): Payer: 59

## 2015-06-20 ENCOUNTER — Encounter (HOSPITAL_COMMUNITY)
Admission: RE | Admit: 2015-06-20 | Discharge: 2015-06-20 | Disposition: A | Discharge status: Home / Self Care | Payer: 59 | Source: Ambulatory Visit | Attending: Cardiology | Admitting: Cardiology

## 2015-06-20 DIAGNOSIS — Z951 Presence of aortocoronary bypass graft: Secondary | ICD-10-CM

## 2015-06-20 DIAGNOSIS — I213 ST elevation (STEMI) myocardial infarction of unspecified site: Secondary | ICD-10-CM | POA: Diagnosis not present

## 2015-06-20 NOTE — Progress Notes (Signed)
Daily Session Note  Patient Details  Name: Ryan Ward MRN: [id number] Date of Birth: [birth year] Referring Provider:            CARDIAC REHAB PHASE II ORIENTATION from 06/14/2015 in [place]   Referring Provider  Martinique, Peter MD      Encounter Date: 06/20/2015  Check In:     Session Check In - 06/20/15 0743    Check-In   Location [facility]   Staff Present Maurice Small, RN, BSN;Joann Rion, RN, BSN;Molly diVincenzo, MS, ACSM RCEP, Exercise Physiologist   Supervising physician immediately available to respond to emergencies [facility] immediately available   Physician(s) Dr. Marily Memos   Medication changes reported     No   Fall or balance concerns reported    No   Warm-up and Cool-down Performed as group-led instruction   Resistance Training Performed Yes   VAD Patient? No   Pain Assessment   Currently in Pain? No/denies      Capillary Blood Glucose: No results found for this or any previous visit (from the past 24 hour(s)).   Goals Met:  Exercise tolerated well Personal goals reviewed  Goals Unmet:  Not Applicable  Comments:  Pt started full exercise with Phase II cardiac rehab. Pt is prior participant in cardiac rehab from prior cardiac event. Pt tolerated light exercise without difficulty. VSS, telemetry-SR, asymptomatic.  Medication list reconciled. Pt denies barriers to medicaiton compliance.  PSYCHOSOCIAL ASSESSMENT:  PHQ-0. Pt exhibits positive coping skills, hopeful outlook with supportive family. No psychosocial needs identified at this time, no psychosocial interventions necessary.  Pt is excited to feel as well as he does and has returned back to work without any difficulties.  Pt enjoys working on cars, house maintenance and Buyer, retail. Pt desires to regain energy strength he had prior to MI in December.  Pt also desires knowledge and ability to have healthy lifestyle that will  allow live longer.. Will monitor pt progress toward meeting these goals. Pt oriented to exercise equipment and routine.    Understanding verbalized. Maurice Small RN, BSN    Dr. Fransico Him is Medical Director for Cardiac Rehab at [facility].

## 2015-06-22 ENCOUNTER — Encounter (HOSPITAL_COMMUNITY)
Admission: RE | Admit: 2015-06-22 | Discharge: 2015-06-22 | Disposition: A | Discharge status: Home / Self Care | Payer: 59 | Source: Ambulatory Visit | Attending: Cardiology | Admitting: Cardiology

## 2015-06-22 DIAGNOSIS — Z951 Presence of aortocoronary bypass graft: Secondary | ICD-10-CM

## 2015-06-22 DIAGNOSIS — I213 ST elevation (STEMI) myocardial infarction of unspecified site: Secondary | ICD-10-CM | POA: Diagnosis not present

## 2015-06-23 LAB — BASIC METABOLIC PANEL
BUN: 19 mg/dL (ref 7–25)
CO2: 30 mmol/L (ref 20–31)
Calcium: 8.9 mg/dL (ref 8.6–10.3)
Chloride: 101 mmol/L (ref 98–110)
Creat: 1.27 mg/dL — ABNORMAL HIGH (ref 0.70–1.25)
Glucose, Bld: 92 mg/dL (ref 65–99)
Potassium: 4.1 mmol/L (ref 3.5–5.3)
Sodium: 141 mmol/L (ref 135–146)

## 2015-06-23 LAB — BRAIN NATRIURETIC PEPTIDE: BRAIN NATRIURETIC PEPTIDE: 263 pg/mL — AB (ref <100)

## 2015-06-23 NOTE — Progress Notes (Signed)
Cardiac Individual Treatment Plan  Patient Details  Name: Ryan Ward MRN: [id number] Date of Birth: 1945-07-20 Referring Provider:        CARDIAC REHAB PHASE II ORIENTATION from 06/14/2015 in [place]   Referring Provider  Martinique, Peter MD      Initial Encounter Date:       CARDIAC REHAB PHASE II ORIENTATION from 06/14/2015 in [place]   Date  06/14/15   Referring Provider  Martinique, Peter MD      Visit Diagnosis: S/P CABG (coronary artery bypass graft)  Patient's Home Medications on Admission:  Current outpatient prescriptions:  .  aspirin EC 81 MG tablet, Take 1 tablet (81 mg total) by mouth daily., Disp: 90 tablet, Rfl: 3 .  atorvastatin (LIPITOR) 80 MG tablet, TAKE 1 TABLET (80 MG TOTAL) BY MOUTH DAILY AT 6 PM., Disp: 30 tablet, Rfl: 2 .  Cholecalciferol (VITAMIN D3) 5000 UNITS TABS, Take 5,000 Units by mouth daily. , Disp: , Rfl:  .  clopidogrel (PLAVIX) 75 MG tablet, Take 1 tablet (75 mg total) by mouth daily., Disp: 30 tablet, Rfl: 1 .  famotidine (PEPCID) 20 MG tablet, Take 20 mg by mouth 2 (two) times daily., Disp: , Rfl:  .  ferrous [id number] C-folic acid (TRINSICON / FOLTRIN) capsule, Take 1 capsule by mouth 3 (three) times daily after meals., Disp: 90 capsule, Rfl: 1 .  furosemide (LASIX) 40 MG tablet, Take 2 tablets in the AM 1 tablet in the PM, Disp: 90 tablet, Rfl: 3 .  levothyroxine (SYNTHROID, LEVOTHROID) 50 MCG tablet, Take 50 mcg by mouth 2 (two) times daily., Disp: , Rfl:  .  lisinopril (PRINIVIL,ZESTRIL) 2.5 MG tablet, Take 1 tablet (2.5 mg total) by mouth daily., Disp: 30 tablet, Rfl: 11 .  Multiple Vitamin (MULTIVITAMIN WITH MINERALS) TABS tablet, Take 1 tablet by mouth daily., Disp: , Rfl:  .  Potassium Chloride ER 20 MEQ TBCR, Take 40 mEq by mouth daily., Disp: 90 tablet, Rfl: 1 .  PROAIR HFA 108 (90 Base) MCG/ACT inhaler, , Disp: , Rfl:   Past Medical History: Past Medical  History  Diagnosis Date  . OSA (obstructive sleep apnea)     a. not using CPAP at home, does have machine  . Hypertensive heart disease   . Hyperlipidemia   . CAD (coronary artery disease)     a. 12/07/14 Inf STEMI/PCI: LAD 85p/m, 72m/d, LCX 80ost, RCA 30p/m, 79m/d, 100d (4.0x20 Promus Premier DES). Hosp course complicated by VF arrest, CGS, CHB req temp wire; b. 03/2015 Cath: LAD 85p/m, 95d, LCx 80ost, RCA 30-40p/m/d, RPDA 75, EF 30-35%, Nl CO; c. 04/06/2015 CABGx4 (LIMA->LAD, VG->Diag, Seq VG->prox RPDA->mid RPDA.  . Paroxysmal atrial fibrillation ([place])     a. 12/2014 In setting of Inf MI->convertred spont.  . Pulmonary embolism ([place])     a. 12/2014 R PT and peroneal vein DVT and RUL PE-->Xarelto x 3 mos.  . Asthma     VERY MILD  . Ischemic cardiomyopathy     a. 03/2015 Echo: EF 30-35%, diff HK, mild MR, mod dil RA, PASP 88mmHg.  [name] Chronic systolic CHF (congestive heart failure) ([place])     a. 03/2015 Echo: EF 30-35%, diff HK.    Tobacco Use: History  Smoking status  . Never Smoker   Smokeless tobacco  . Never Used    Labs: Recent Review Flowsheet Data    Labs for ITP Cardiac and Pulmonary Rehab Latest Ref Rng 04/09/2015 04/09/2015  04/10/2015 04/11/2015 06/01/2015   Cholestrol 125 - 200 mg/dL - - - - 92(L)   LDLCALC <130 mg/dL - - - - 48   HDL >=40 mg/dL - - - - 34(L)   Trlycerides <150 mg/dL - - - - 48   TCO2 0 - 100 mmol/L - 29 30 - -   O2SAT - 57.1 - 42.8 63.1 -      Capillary Blood Glucose: Lab Results  Component Value Date   GLUCAP 95 04/13/2015   GLUCAP 81 04/13/2015   GLUCAP 104* 04/12/2015   GLUCAP 86 04/12/2015   GLUCAP 89 04/12/2015     Exercise Target Goals:    Exercise Program Goal: Individual exercise prescription set with THRR, safety & activity barriers. Participant demonstrates ability to understand and report RPE using BORG scale, to self-measure pulse accurately, and to acknowledge the importance of the exercise prescription.  Exercise Prescription  Goal: Starting with aerobic activity 30 plus minutes a day, 3 days per week for initial exercise prescription. Provide home exercise prescription and guidelines that participant acknowledges understanding prior to discharge.  Activity Barriers & Risk Stratification:     Activity Barriers & Cardiac Risk Stratification - 06/14/15 1712    Activity Barriers & Cardiac Risk Stratification   Activity Barriers None   Cardiac Risk Stratification High      6 Minute Walk:     6 Minute Walk      06/14/15 1713       6 Minute Walk   Phase Initial     Distance 1639 feet     Walk Time 6 minutes     # of Rest Breaks 0     MPH 3.1     METS 3.3     RPE 13     VO2 Peak 11.56     Symptoms No     Resting HR 64 bpm     Resting BP 104/60 mmHg     Max Ex. HR 89 bpm     Max Ex. BP 116/68 mmHg     2 Minute Post BP 110/60 mmHg        Initial Exercise Prescription:     Initial Exercise Prescription - 06/15/15 0800    Date of Initial Exercise RX and Referring Provider   Date 06/14/15   Referring Provider Martinique, Peter MD   Treadmill   MPH 2.5   Grade 1   Minutes 10   METs 3.26   Bike   Level 1   Minutes 10   METs 3.3   NuStep   Level 3   Minutes 10   METs 2.5   Prescription Details   Frequency (times per week) 3   Duration Progress to 30 minutes of continuous aerobic without signs/symptoms of physical distress   Intensity   THRR 40-80% of Max Heartrate 60-121   Ratings of Perceived Exertion 11-13   Progression   Progression Continue to progress workloads to maintain intensity without signs/symptoms of physical distress.   Resistance Training   Training Prescription Yes   Weight 2 lb   Reps 10-12      Perform Capillary Blood Glucose checks as needed.  Exercise Prescription Changes:   Exercise Comments:   Discharge Exercise Prescription (Final Exercise Prescription Changes):   Nutrition:  Target Goals: Understanding of nutrition guidelines, daily intake of sodium  1500mg , cholesterol 200mg , calories 30% from fat and 7% or less from saturated fats, daily to have 5 or more servings of fruits and vegetables.  Biometrics:     Pre Biometrics - 06/14/15 1711    Pre Biometrics   Height 5\' 7"  (1.702 m)   Weight 179 lb 10.8 oz (81.5 kg)   Waist Circumference 36.75 inches   Hip Circumference 40 inches   Waist to Hip Ratio 0.92 %   BMI (Calculated) 28.2   Triceps Skinfold 13 mm   % Body Fat 25.7 %   Grip Strength 48.5 kg   Flexibility 9 in   Single Leg Stand 30 seconds       Nutrition Therapy Plan and Nutrition Goals:     Nutrition Therapy & Goals - 06/20/15 1022    Nutrition Therapy   Diet Therapeutic Lifestyle Changes   Personal Nutrition Goals   Personal Goal #1 Maintain wt around 182 lb while in [place], educate and counsel regarding individualized specific dietary modifications aiming towards targeted core components such as weight, hypertension, lipid management, diabetes, heart failure and other comorbidities.   Expected Outcomes Short Term Goal: Understand basic principles of dietary content, such as calories, fat, sodium, cholesterol and nutrients.;Long Term Goal: Adherence to prescribed nutrition plan.      Nutrition Discharge: Nutrition Scores:     Nutrition Assessments - 06/20/15 1022    MEDFICTS Scores   Pre Score 18      Nutrition Goals Re-Evaluation:   Psychosocial: Target Goals: Acknowledge presence or absence of depression, maximize coping skills, provide positive support system. Participant is able to verbalize types and ability to use techniques and skills needed for reducing stress and depression.  Initial Review & Psychosocial Screening:     Initial Psych Review & Screening - 06/14/15 [place]? Yes   Barriers   Psychosocial barriers to participate in program The patient should benefit from training in stress management  and relaxation.   Screening Interventions   Interventions Encouraged to exercise      Quality of Life Scores:     Quality of Life - 06/14/15 1713    Quality of Life Scores   Health/Function Pre 24.7 %   Socioeconomic Pre 28.07 %   Psych/Spiritual Pre 25 %   Family Pre 28.8 %   GLOBAL Pre 26.06 %      PHQ-9:     Recent Review Flowsheet Data    Depression screen [facility] 2/9 06/20/2015 02/02/2015   Decreased Interest 0 0   Down, Depressed, Hopeless 0 0   PHQ - 2 Score 0 0      Psychosocial Evaluation and Intervention:   Psychosocial Re-Evaluation:   Vocational Rehabilitation: Provide vocational rehab assistance to qualifying candidates.   Vocational Rehab Evaluation & Intervention:   Education: Education Goals: Education classes will be provided on a weekly basis, covering required topics. Participant will state understanding/return demonstration of topics presented.  Learning Barriers/Preferences:     Learning Barriers/Preferences - 06/14/15 1634    Learning Barriers/Preferences   Learning Barriers None;Reading   Learning Preferences Skilled Demonstration      Education Topics: Count Your Pulse:  -Group instruction provided by verbal instruction, demonstration, patient participation and written materials to support subject.  Instructors address importance of being able to find your pulse and how to count your pulse when at home without a heart monitor.  Patients get hands on experience counting their pulse with staff help and individually.   Heart Attack, Angina, and Risk Factor Modification:  -Group instruction provided by verbal instruction,  video, and written materials to support subject.  Instructors address signs and symptoms of angina and heart attacks.    Also discuss risk factors for heart disease and how to make changes to improve heart health risk factors.   Functional Fitness:  -Group instruction provided by verbal instruction, demonstration, patient  participation, and written materials to support subject.  Instructors address safety measures for doing things around the house.  Discuss how to get up and down off the floor, how to pick things up properly, how to safely get out of a chair without assistance, and balance training.   Meditation and Mindfulness:  -Group instruction provided by verbal instruction, patient participation, and written materials to support subject.  Instructor addresses importance of mindfulness and meditation practice to help reduce stress and improve awareness.  Instructor also leads participants through a meditation exercise.    Stretching for Flexibility and Mobility:  -Group instruction provided by verbal instruction, patient participation, and written materials to support subject.  Instructors lead participants through series of stretches that are designed to increase flexibility thus improving mobility.  These stretches are additional exercise for major muscle groups that are typically performed during regular warm up and cool down.   Hands Only CPR Anytime:  -Group instruction provided by verbal instruction, video, patient participation and written materials to support subject.  Instructors co-teach with AHA video for hands only CPR.  Participants get hands on experience with mannequins.   Nutrition I class: Heart Healthy Eating:  -Group instruction provided by PowerPoint slides, verbal discussion, and written materials to support subject matter. The instructor gives an explanation and review of the Therapeutic Lifestyle Changes diet recommendations, which includes a discussion on lipid goals, dietary fat, sodium, fiber, plant stanol/sterol esters, sugar, and the components of a well-balanced, healthy diet.   Nutrition II class: Lifestyle Skills:  -Group instruction provided by PowerPoint slides, verbal discussion, and written materials to support subject matter. The instructor gives an explanation and review  of label reading, grocery shopping for heart health, heart healthy recipe modifications, and ways to make healthier choices when eating out.   Diabetes Question & Answer:  -Group instruction provided by PowerPoint slides, verbal discussion, and written materials to support subject matter. The instructor gives an explanation and review of diabetes co-morbidities, pre- and post-prandial blood glucose goals, pre-exercise blood glucose goals, signs, symptoms, and treatment of hypoglycemia and hyperglycemia, and foot care basics.   Diabetes Blitz:  -Group instruction provided by PowerPoint slides, verbal discussion, and written materials to support subject matter. The instructor gives an explanation and review of the physiology behind type 1 and type 2 diabetes, diabetes medications and rational behind using different medications, pre- and post-prandial blood glucose recommendations and Hemoglobin A1c goals, diabetes diet, and exercise including blood glucose guidelines for exercising safely.    Portion Distortion:  -Group instruction provided by PowerPoint slides, verbal discussion, written materials, and food models to support subject matter. The instructor gives an explanation of serving size versus portion size, changes in portions sizes over the last 20 years, and what consists of a serving from each food group.   Stress Management:  -Group instruction provided by verbal instruction, video, and written materials to support subject matter.  Instructors review role of stress in heart disease and how to cope with stress positively.     Exercising on Your Own:  -Group instruction provided by verbal instruction, power point, and written materials to support subject.  Instructors discuss benefits of exercise, components of exercise,  frequency and intensity of exercise, and end points for exercise.  Also discuss use of nitroglycerin and activating EMS.  Review options of places to exercise outside of  rehab.  Review guidelines for sex with heart disease.   Cardiac Drugs I:  -Group instruction provided by verbal instruction and written materials to support subject.  Instructor reviews cardiac drug classes: antiplatelets, anticoagulants, beta blockers, and statins.  Instructor discusses reasons, side effects, and lifestyle considerations for each drug class.   Cardiac Drugs II:  -Group instruction provided by verbal instruction and written materials to support subject.  Instructor reviews cardiac drug classes: angiotensin converting enzyme inhibitors (ACE-I), angiotensin II receptor blockers (ARBs), nitrates, and calcium channel blockers.  Instructor discusses reasons, side effects, and lifestyle considerations for each drug class.   Anatomy and Physiology of the Circulatory System:  -Group instruction provided by verbal instruction, video, and written materials to support subject.  Reviews functional anatomy of heart, how it relates to various diagnoses, and what role the heart plays in the overall system.   Knowledge Questionnaire Score:     Knowledge Questionnaire Score - 06/14/15 1712    Knowledge Questionnaire Score   Pre Score 22/24      Core Components/Risk Factors/Patient Goals at Admission:     Personal Goals and Risk Factors at Admission - 06/14/15 1634    Core Components/Risk Factors/Patient Goals on Admission   Sedentary Yes   Intervention Provide advice, education, support and counseling about physical activity/exercise needs.;Develop an individualized exercise prescription for aerobic and resistive training based on initial evaluation findings, risk stratification, comorbidities and participant's personal goals.   Expected Outcomes Achievement of increased cardiorespiratory fitness and enhanced flexibility, muscular endurance and strength shown through measurements of functional capacity and personal statement of participant.   Increase Strength and Stamina Yes    Intervention Provide advice, education, support and counseling about physical activity/exercise needs.;Develop an individualized exercise prescription for aerobic and resistive training based on initial evaluation findings, risk stratification, comorbidities and participant's personal goals.   Expected Outcomes Achievement of increased cardiorespiratory fitness and enhanced flexibility, muscular endurance and strength shown through measurements of functional capacity and personal statement of participant.      Core Components/Risk Factors/Patient Goals Review:    Core Components/Risk Factors/Patient Goals at Discharge (Final Review):    ITP Comments:     ITP Comments      03/16/15 0901 06/14/15 1546         ITP Comments see paper chart for previous documentation    Dr. Fransico Him, MD Medical Director Dr. Fransico Him, Medical Director          Comments:  Pt is making expected progress toward personal goals after completing  2 sessions. Recommend continued exercise and life style modification education including  stress management and relaxation techniques to decrease cardiac risk profile. Pt is off to a good start with participating in cardiac rehab. Cherre Huger, BSN

## 2015-06-23 NOTE — Telephone Encounter (Addendum)
Opened in error

## 2015-06-24 ENCOUNTER — Encounter (HOSPITAL_COMMUNITY): Admission: RE | Admit: 2015-06-24 | Payer: 59 | Source: Ambulatory Visit

## 2015-06-27 ENCOUNTER — Encounter (HOSPITAL_COMMUNITY)
Admission: RE | Admit: 2015-06-27 | Discharge: 2015-06-27 | Disposition: A | Discharge status: Home / Self Care | Payer: 59 | Source: Ambulatory Visit | Attending: Cardiology | Admitting: Cardiology

## 2015-06-27 DIAGNOSIS — I213 ST elevation (STEMI) myocardial infarction of unspecified site: Secondary | ICD-10-CM | POA: Diagnosis not present

## 2015-06-27 DIAGNOSIS — Z951 Presence of aortocoronary bypass graft: Secondary | ICD-10-CM

## 2015-06-29 ENCOUNTER — Encounter (HOSPITAL_COMMUNITY)
Admission: RE | Admit: 2015-06-29 | Discharge: 2015-06-29 | Disposition: A | Discharge status: Home / Self Care | Payer: 59 | Source: Ambulatory Visit | Attending: Cardiology | Admitting: Cardiology

## 2015-06-29 DIAGNOSIS — I213 ST elevation (STEMI) myocardial infarction of unspecified site: Secondary | ICD-10-CM | POA: Diagnosis not present

## 2015-06-29 DIAGNOSIS — Z951 Presence of aortocoronary bypass graft: Secondary | ICD-10-CM

## 2015-06-29 NOTE — Progress Notes (Signed)
Reviewed home exercise with pt today.  Pt plans to walk and do elliptical for exercise, 2-3x/week in addition to cardiac rehab.  Reviewed THR, pulse, RPE, sign and symptoms, NTG use, and when to call 911 or MD.  Also discussed weather considerations and indoor options.  Pt voiced understanding.   Ryan Ward [employer]

## 2015-07-01 ENCOUNTER — Encounter (HOSPITAL_COMMUNITY)
Admission: RE | Admit: 2015-07-01 | Discharge: 2015-07-01 | Disposition: A | Discharge status: Home / Self Care | Payer: 59 | Source: Ambulatory Visit | Attending: Cardiology | Admitting: Cardiology

## 2015-07-01 DIAGNOSIS — I213 ST elevation (STEMI) myocardial infarction of unspecified site: Secondary | ICD-10-CM | POA: Diagnosis not present

## 2015-07-01 DIAGNOSIS — Z951 Presence of aortocoronary bypass graft: Secondary | ICD-10-CM

## 2015-07-04 ENCOUNTER — Encounter (HOSPITAL_COMMUNITY)
Admission: RE | Admit: 2015-07-04 | Discharge: 2015-07-04 | Disposition: A | Discharge status: Home / Self Care | Payer: 59 | Source: Ambulatory Visit | Attending: Cardiology | Admitting: Cardiology

## 2015-07-04 DIAGNOSIS — Z955 Presence of coronary angioplasty implant and graft: Secondary | ICD-10-CM | POA: Insufficient documentation

## 2015-07-04 DIAGNOSIS — Z951 Presence of aortocoronary bypass graft: Secondary | ICD-10-CM

## 2015-07-04 DIAGNOSIS — I213 ST elevation (STEMI) myocardial infarction of unspecified site: Secondary | ICD-10-CM | POA: Diagnosis present

## 2015-07-06 ENCOUNTER — Encounter (HOSPITAL_COMMUNITY)
Admission: RE | Admit: 2015-07-06 | Discharge: 2015-07-06 | Disposition: A | Discharge status: Home / Self Care | Payer: 59 | Source: Ambulatory Visit | Attending: Cardiology | Admitting: Cardiology

## 2015-07-06 DIAGNOSIS — I213 ST elevation (STEMI) myocardial infarction of unspecified site: Secondary | ICD-10-CM | POA: Diagnosis not present

## 2015-07-06 DIAGNOSIS — Z951 Presence of aortocoronary bypass graft: Secondary | ICD-10-CM

## 2015-07-07 ENCOUNTER — Telehealth: Payer: Self-pay | Admitting: Cardiology

## 2015-07-07 NOTE — Telephone Encounter (Signed)
Received FMLA forms back from [employer]. Signed by Dr Martinique.  Contacted patient FMLA forms ready for pick up. lp

## 2015-07-08 ENCOUNTER — Encounter (HOSPITAL_COMMUNITY)
Admission: RE | Admit: 2015-07-08 | Discharge: 2015-07-08 | Disposition: A | Discharge status: Home / Self Care | Payer: 59 | Source: Ambulatory Visit | Attending: Cardiology | Admitting: Cardiology

## 2015-07-08 DIAGNOSIS — I213 ST elevation (STEMI) myocardial infarction of unspecified site: Secondary | ICD-10-CM | POA: Diagnosis not present

## 2015-07-08 DIAGNOSIS — Z951 Presence of aortocoronary bypass graft: Secondary | ICD-10-CM

## 2015-07-08 NOTE — Progress Notes (Signed)
Daily Session Note  Patient Details  Name: Anvith Mauriello MRN: [id number] Date of Birth: 1945-10-17 Referring Provider:        CARDIAC REHAB PHASE II ORIENTATION from 06/14/2015 in [place]   Referring Provider  Martinique, Peter MD      Encounter Date: 07/08/2015  Check In:     Session Check In - 07/08/15 0719    Check-In   Location [facility]   Staff Present Su Hilt, MS, ACSM RCEP, Exercise Physiologist;Carlette Wilber Oliphant, RN, BSN;Amber Fair, MS, ACSM RCEP, Exercise Physiologist;Joann Rion, RN, BSN   Supervising physician immediately available to respond to emergencies [facility] immediately available   Physician(s) Dr. Marthenia Rolling   Medication changes reported     No   Fall or balance concerns reported    No   Warm-up and Cool-down Performed as group-led instruction   Resistance Training Performed Yes   VAD Patient? No   Pain Assessment   Currently in Pain? No/denies   Multiple Pain Sites No      Capillary Blood Glucose: No results found for this or any previous visit (from the past 24 hour(s)).   Goals Met:  Independence with exercise equipment Exercise tolerated well No report of cardiac concerns or symptoms  Goals Unmet:  Not Applicable  Comments:  QUALITY OF LIFE SCORE REVIEW  Pt completed Quality of Life survey as a participant in Cardiac Rehab. Scores 21.0 or below are considered low. Pt scored the following     Quality of Life - 06/14/15 1713    Quality of Life Scores   Health/Function Pre 24.7 %   Socioeconomic Pre 28.07 %   Psych/Spiritual Pre 25 %   Family Pre 28.8 %   GLOBAL Pre 26.06 %     Pt  scored well above the low threshold.  Pt demonstrates positive and heathy coping skills with good support systems in place. Pt denies any psychosocial needs at this time.   Will continue to monitor and intervene as necessary. Maurice Small RN, BSN         Dr. Fransico Him is Medical  Director for Cardiac Rehab at [facility].

## 2015-07-11 ENCOUNTER — Encounter (HOSPITAL_COMMUNITY)
Admission: RE | Admit: 2015-07-11 | Discharge: 2015-07-11 | Disposition: A | Discharge status: Home / Self Care | Payer: 59 | Source: Ambulatory Visit | Attending: Cardiology | Admitting: Cardiology

## 2015-07-11 ENCOUNTER — Other Ambulatory Visit (HOSPITAL_COMMUNITY): Payer: 59

## 2015-07-11 DIAGNOSIS — I213 ST elevation (STEMI) myocardial infarction of unspecified site: Secondary | ICD-10-CM | POA: Diagnosis not present

## 2015-07-11 DIAGNOSIS — Z951 Presence of aortocoronary bypass graft: Secondary | ICD-10-CM

## 2015-07-13 ENCOUNTER — Encounter (HOSPITAL_COMMUNITY): Payer: 59

## 2015-07-15 ENCOUNTER — Encounter (HOSPITAL_COMMUNITY)
Admission: RE | Admit: 2015-07-15 | Discharge: 2015-07-15 | Disposition: A | Discharge status: Home / Self Care | Payer: 59 | Source: Ambulatory Visit | Attending: Cardiology | Admitting: Cardiology

## 2015-07-15 DIAGNOSIS — Z951 Presence of aortocoronary bypass graft: Secondary | ICD-10-CM

## 2015-07-15 DIAGNOSIS — I213 ST elevation (STEMI) myocardial infarction of unspecified site: Secondary | ICD-10-CM | POA: Diagnosis not present

## 2015-07-18 ENCOUNTER — Ambulatory Visit (HOSPITAL_COMMUNITY): Payer: 59 | Attending: Cardiology

## 2015-07-18 ENCOUNTER — Encounter (HOSPITAL_COMMUNITY)
Admission: RE | Admit: 2015-07-18 | Discharge: 2015-07-18 | Disposition: A | Discharge status: Home / Self Care | Payer: 59 | Source: Ambulatory Visit | Attending: Cardiology | Admitting: Cardiology

## 2015-07-18 ENCOUNTER — Other Ambulatory Visit: Payer: Self-pay

## 2015-07-18 DIAGNOSIS — I351 Nonrheumatic aortic (valve) insufficiency: Secondary | ICD-10-CM | POA: Insufficient documentation

## 2015-07-18 DIAGNOSIS — I2510 Atherosclerotic heart disease of native coronary artery without angina pectoris: Secondary | ICD-10-CM

## 2015-07-18 DIAGNOSIS — I340 Nonrheumatic mitral (valve) insufficiency: Secondary | ICD-10-CM | POA: Insufficient documentation

## 2015-07-18 DIAGNOSIS — G4733 Obstructive sleep apnea (adult) (pediatric): Secondary | ICD-10-CM | POA: Insufficient documentation

## 2015-07-18 DIAGNOSIS — E785 Hyperlipidemia, unspecified: Secondary | ICD-10-CM | POA: Diagnosis not present

## 2015-07-18 DIAGNOSIS — I5042 Chronic combined systolic (congestive) and diastolic (congestive) heart failure: Secondary | ICD-10-CM | POA: Insufficient documentation

## 2015-07-18 DIAGNOSIS — I110 Hypertensive heart disease with heart failure: Secondary | ICD-10-CM | POA: Diagnosis not present

## 2015-07-18 DIAGNOSIS — Z951 Presence of aortocoronary bypass graft: Secondary | ICD-10-CM

## 2015-07-18 DIAGNOSIS — I213 ST elevation (STEMI) myocardial infarction of unspecified site: Secondary | ICD-10-CM | POA: Diagnosis not present

## 2015-07-18 DIAGNOSIS — R29898 Other symptoms and signs involving the musculoskeletal system: Secondary | ICD-10-CM | POA: Insufficient documentation

## 2015-07-18 DIAGNOSIS — I255 Ischemic cardiomyopathy: Secondary | ICD-10-CM | POA: Diagnosis not present

## 2015-07-18 DIAGNOSIS — I071 Rheumatic tricuspid insufficiency: Secondary | ICD-10-CM | POA: Insufficient documentation

## 2015-07-20 ENCOUNTER — Telehealth: Payer: Self-pay | Admitting: Cardiology

## 2015-07-20 ENCOUNTER — Encounter (HOSPITAL_COMMUNITY)
Admission: RE | Admit: 2015-07-20 | Discharge: 2015-07-20 | Disposition: A | Discharge status: Home / Self Care | Payer: 59 | Source: Ambulatory Visit | Attending: Cardiology | Admitting: Cardiology

## 2015-07-20 DIAGNOSIS — Z951 Presence of aortocoronary bypass graft: Secondary | ICD-10-CM

## 2015-07-20 DIAGNOSIS — I213 ST elevation (STEMI) myocardial infarction of unspecified site: Secondary | ICD-10-CM | POA: Diagnosis not present

## 2015-07-20 MED ORDER — FUROSEMIDE 40 MG PO TABS: ORAL_TABLET | ORAL | Status: DC

## 2015-07-20 MED ORDER — LEVOTHYROXINE SODIUM 50 MCG PO TABS: 50.0000 ug | ORAL_TABLET | Freq: Two times a day (BID) | ORAL | Status: DC

## 2015-07-20 NOTE — Telephone Encounter (Signed)
New message  Pt wants rn to call him to discuss about the amount of medications he said the dosages is confusing  *STAT* If patient is at the pharmacy, call can be transferred to refill team.   1. Which medications need to be refilled? (please list name of each medication and dose if known) ledothyroxine 50mg  & furosmide 90mg  2. Which pharmacy/location (including street and city if local pharmacy) is medication to be sent to? [facility] on [address]. Do they need a 30 day or 90 day supply? [place]

## 2015-07-20 NOTE — Telephone Encounter (Signed)
Returned call to patient.He stated he needed 90 day refills on lasix and levothyroxine.Refills sent to pharmacy.

## 2015-07-21 NOTE — Progress Notes (Signed)
Cardiac Individual Treatment Plan  Patient Details  Name: Taevian Regehr MRN: [id number] Date of Birth: Jul 02, 1945 Referring Provider:        CARDIAC REHAB PHASE II ORIENTATION from 06/14/2015 in [place]   Referring Provider  Martinique, Peter MD      Initial Encounter Date:       CARDIAC REHAB PHASE II ORIENTATION from 06/14/2015 in [place]   Date  06/14/15   Referring Provider  Martinique, Peter MD      Visit Diagnosis: S/P CABG (coronary artery bypass graft)  Patient's Home Medications on Admission:  Current outpatient prescriptions:  .  aspirin EC 81 MG tablet, Take 1 tablet (81 mg total) by mouth daily., Disp: 90 tablet, Rfl: 3 .  atorvastatin (LIPITOR) 80 MG tablet, TAKE 1 TABLET (80 MG TOTAL) BY MOUTH DAILY AT 6 PM., Disp: 30 tablet, Rfl: 2 .  Cholecalciferol (VITAMIN D3) 5000 UNITS TABS, Take 5,000 Units by mouth daily. , Disp: , Rfl:  .  clopidogrel (PLAVIX) 75 MG tablet, Take 1 tablet (75 mg total) by mouth daily., Disp: 30 tablet, Rfl: 1 .  famotidine (PEPCID) 20 MG tablet, Take 20 mg by mouth 2 (two) times daily., Disp: , Rfl:  .  ferrous [id number] C-folic acid (TRINSICON / FOLTRIN) capsule, Take 1 capsule by mouth 3 (three) times daily after meals., Disp: 90 capsule, Rfl: 1 .  furosemide (LASIX) 40 MG tablet, Take 2 tablets in the AM 1 tablet in the PM, Disp: 270 tablet, Rfl: 3 .  levothyroxine (SYNTHROID, LEVOTHROID) 50 MCG tablet, Take 1 tablet (50 mcg total) by mouth 2 (two) times daily., Disp: 180 tablet, Rfl: 3 .  lisinopril (PRINIVIL,ZESTRIL) 2.5 MG tablet, Take 1 tablet (2.5 mg total) by mouth daily., Disp: 30 tablet, Rfl: 11 .  Multiple Vitamin (MULTIVITAMIN WITH MINERALS) TABS tablet, Take 1 tablet by mouth daily., Disp: , Rfl:  .  Potassium Chloride ER 20 MEQ TBCR, Take 40 mEq by mouth daily., Disp: 90 tablet, Rfl: 1 .  PROAIR HFA 108 (90 Base) MCG/ACT inhaler, , Disp: , Rfl:   Past Medical  History: Past Medical History  Diagnosis Date  . OSA (obstructive sleep apnea)     a. not using CPAP at home, does have machine  . Hypertensive heart disease   . Hyperlipidemia   . CAD (coronary artery disease)     a. 12/07/14 Inf STEMI/PCI: LAD 85p/m, 66m/d, LCX 80ost, RCA 30p/m, 19m/d, 100d (4.0x20 Promus Premier DES). Hosp course complicated by VF arrest, CGS, CHB req temp wire; b. 03/2015 Cath: LAD 85p/m, 95d, LCx 80ost, RCA 30-40p/m/d, RPDA 75, EF 30-35%, Nl CO; c. 04/06/2015 CABGx4 (LIMA->LAD, VG->Diag, Seq VG->prox RPDA->mid RPDA.  . Paroxysmal atrial fibrillation ([place])     a. 12/2014 In setting of Inf MI->convertred spont.  . Pulmonary embolism ([place])     a. 12/2014 R PT and peroneal vein DVT and RUL PE-->Xarelto x 3 mos.  . Asthma     VERY MILD  . Ischemic cardiomyopathy     a. 03/2015 Echo: EF 30-35%, diff HK, mild MR, mod dil RA, PASP 56mmHg.  [name] Chronic systolic CHF (congestive heart failure) ([place])     a. 03/2015 Echo: EF 30-35%, diff HK.    Tobacco Use: History  Smoking status  . Never Smoker   Smokeless tobacco  . Never Used    Labs: Recent Review Flowsheet Data    Labs for ITP Cardiac and Pulmonary Rehab Latest  Ref Rng 04/09/2015 04/09/2015 04/10/2015 04/11/2015 06/01/2015   Cholestrol 125 - 200 mg/dL - - - - 92(L)   LDLCALC <130 mg/dL - - - - 48   HDL >=40 mg/dL - - - - 34(L)   Trlycerides <150 mg/dL - - - - 48   TCO2 0 - 100 mmol/L - 29 30 - -   O2SAT - 57.1 - 42.8 63.1 -      Capillary Blood Glucose: Lab Results  Component Value Date   GLUCAP 95 04/13/2015   GLUCAP 81 04/13/2015   GLUCAP 104* 04/12/2015   GLUCAP 86 04/12/2015   GLUCAP 89 04/12/2015     Exercise Target Goals:    Exercise Program Goal: Individual exercise prescription set with THRR, safety & activity barriers. Participant demonstrates ability to understand and report RPE using BORG scale, to self-measure pulse accurately, and to acknowledge the importance of the exercise  prescription.  Exercise Prescription Goal: Starting with aerobic activity 30 plus minutes a day, 3 days per week for initial exercise prescription. Provide home exercise prescription and guidelines that participant acknowledges understanding prior to discharge.  Activity Barriers & Risk Stratification:     Activity Barriers & Cardiac Risk Stratification - 06/14/15 1712    Activity Barriers & Cardiac Risk Stratification   Activity Barriers None   Cardiac Risk Stratification High      6 Minute Walk:     6 Minute Walk      06/14/15 1713       6 Minute Walk   Phase Initial     Distance 1639 feet     Walk Time 6 minutes     # of Rest Breaks 0     MPH 3.1     METS 3.3     RPE 13     VO2 Peak 11.56     Symptoms No     Resting HR 64 bpm     Resting BP 104/60 mmHg     Max Ex. HR 89 bpm     Max Ex. BP 116/68 mmHg     2 Minute Post BP 110/60 mmHg        Initial Exercise Prescription:     Initial Exercise Prescription - 06/15/15 0800    Date of Initial Exercise RX and Referring Provider   Date 06/14/15   Referring Provider Martinique, Peter MD   Treadmill   MPH 2.5   Grade 1   Minutes 10   METs 3.26   Bike   Level 1   Minutes 10   METs 3.3   NuStep   Level 3   Minutes 10   METs 2.5   Prescription Details   Frequency (times per week) 3   Duration Progress to 30 minutes of continuous aerobic without signs/symptoms of physical distress   Intensity   THRR 40-80% of Max Heartrate 60-121   Ratings of Perceived Exertion 11-13   Progression   Progression Continue to progress workloads to maintain intensity without signs/symptoms of physical distress.   Resistance Training   Training Prescription Yes   Weight 2 lb   Reps 10-12      Perform Capillary Blood Glucose checks as needed.  Exercise Prescription Changes:     Exercise Prescription Changes      07/01/15 1500 07/11/15 1200 07/20/15 1600       Exercise Review   Progression Yes Yes Yes     Response to  Exercise   Blood Pressure (Admit) 104/62 mmHg 104/58 mmHg  100/60 mmHg     Blood Pressure (Exercise) 122/70 mmHg 98/58 mmHg 102/60 mmHg     Blood Pressure (Exit) 94/60 mmHg 86/52 mmHg 100/59 mmHg     Heart Rate (Admit) 74 bpm 73 bpm 80 bpm     Heart Rate (Exercise) 99 bpm 100 bpm 108 bpm     Heart Rate (Exit) 74 bpm 73 bpm 71 bpm     Rating of Perceived Exertion (Exercise) 11 11 11      Comments  Reviewed HEP on 06/29/15 Reviewed HEP on 06/29/15     Duration Progress to 30 minutes of continuous aerobic without signs/symptoms of physical distress Progress to 30 minutes of continuous aerobic without signs/symptoms of physical distress Progress to 30 minutes of continuous aerobic without signs/symptoms of physical distress     Intensity THRR unchanged THRR unchanged THRR unchanged     Progression   Progression Continue to progress workloads to maintain intensity without signs/symptoms of physical distress. Continue to progress workloads to maintain intensity without signs/symptoms of physical distress. Continue to progress workloads to maintain intensity without signs/symptoms of physical distress.     Average METs 3 3 3.1     Resistance Training   Training Prescription Yes Yes Yes     Weight 2lbs 4lbs 4lbs     Reps 10-12 10-12 10-12     Treadmill   MPH 2.5 2.5 2.5     Grade 1 1 1      Minutes 10 10 10      METs 3.26 3.26 3.26     Bike   Level 1 1 1.2     Minutes 10 10 10      METs 3.3 3.3 3.23     NuStep   Level 3 3 3      Minutes 10 10 10      METs 2.7 2.8 3     Home Exercise Plan   Plans to continue exercise at  Home  Plans to walk and use elliptical. See progress note Home  Plans to walk and use elliptical. See progress note     Frequency  Add 2 additional days to program exercise sessions.  2-3x/week Add 2 additional days to program exercise sessions.  2-3x/week        Exercise Comments:     Exercise Comments      07/11/15 1215           Exercise Comments Reviewed METs and  goals. Pt is tolerating exercise well at [facility], will continue to monitor exercise progression.          Discharge Exercise Prescription (Final Exercise Prescription Changes):     Exercise Prescription Changes - 07/20/15 1600    Exercise Review   Progression Yes   Response to Exercise   Blood Pressure (Admit) 100/60 mmHg   Blood Pressure (Exercise) 102/60 mmHg   Blood Pressure (Exit) 100/59 mmHg   Heart Rate (Admit) 80 bpm   Heart Rate (Exercise) 108 bpm   Heart Rate (Exit) 71 bpm   Rating of Perceived Exertion (Exercise) 11   Comments Reviewed HEP on 06/29/15   Duration Progress to 30 minutes of continuous aerobic without signs/symptoms of physical distress   Intensity THRR unchanged   Progression   Progression Continue to progress workloads to maintain intensity without signs/symptoms of physical distress.   Average METs 3.1   Resistance Training   Training Prescription Yes   Weight 4lbs   Reps 10-12   Treadmill   MPH 2.5   Grade 1  Minutes 10   METs 3.26   Bike   Level 1.2   Minutes 10   METs 3.23   NuStep   Level 3   Minutes 10   METs 3   Home Exercise Plan   Plans to continue exercise at Home  Plans to walk and use elliptical. See progress note   Frequency Add 2 additional days to program exercise sessions.  2-3x/week      Nutrition:  Target Goals: Understanding of nutrition guidelines, daily intake of sodium 1500mg , cholesterol 200mg , calories 30% from fat and 7% or less from saturated fats, daily to have 5 or more servings of fruits and vegetables.  Biometrics:     Pre Biometrics - 06/14/15 1711    Pre Biometrics   Height 5\' 7"  (1.702 m)   Weight 179 lb 10.8 oz (81.5 kg)   Waist Circumference 36.75 inches   Hip Circumference 40 inches   Waist to Hip Ratio 0.92 %   BMI (Calculated) 28.2   Triceps Skinfold 13 mm   % Body Fat 25.7 %   Grip Strength 48.5 kg   Flexibility 9 in   Single Leg Stand 30 seconds       Nutrition Therapy Plan and  Nutrition Goals:     Nutrition Therapy & Goals - 06/20/15 1022    Nutrition Therapy   Diet Therapeutic Lifestyle Changes   Personal Nutrition Goals   Personal Goal #1 Maintain wt around 182 lb while in [place], educate and counsel regarding individualized specific dietary modifications aiming towards targeted core components such as weight, hypertension, lipid management, diabetes, heart failure and other comorbidities.   Expected Outcomes Short Term Goal: Understand basic principles of dietary content, such as calories, fat, sodium, cholesterol and nutrients.;Long Term Goal: Adherence to prescribed nutrition plan.      Nutrition Discharge: Nutrition Scores:     Nutrition Assessments - 06/20/15 1022    MEDFICTS Scores   Pre Score 18      Nutrition Goals Re-Evaluation:   Psychosocial: Target Goals: Acknowledge presence or absence of depression, maximize coping skills, provide positive support system. Participant is able to verbalize types and ability to use techniques and skills needed for reducing stress and depression.  Initial Review & Psychosocial Screening:     Initial Psych Review & Screening - 06/14/15 [place]? Yes   Barriers   Psychosocial barriers to participate in program The patient should benefit from training in stress management and relaxation.   Screening Interventions   Interventions Encouraged to exercise      Quality of Life Scores:     Quality of Life - 06/14/15 1713    Quality of Life Scores   Health/Function Pre 24.7 %   Socioeconomic Pre 28.07 %   Psych/Spiritual Pre 25 %   Family Pre 28.8 %   GLOBAL Pre 26.06 %      PHQ-9:     Recent Review Flowsheet Data    Depression screen [facility] 2/9 06/20/2015 02/02/2015   Decreased Interest 0 0   Down, Depressed, Hopeless 0 0   PHQ - 2 Score 0 0      Psychosocial Evaluation and Intervention:   Psychosocial  Re-Evaluation:   Vocational Rehabilitation: Provide vocational rehab assistance to qualifying candidates.   Vocational Rehab Evaluation & Intervention:   Education: Education Goals: Education classes will be provided on a weekly basis, covering required topics.  Participant will state understanding/return demonstration of topics presented.  Learning Barriers/Preferences:     Learning Barriers/Preferences - 06/14/15 1634    Learning Barriers/Preferences   Learning Barriers None;Reading   Learning Preferences Skilled Demonstration      Education Topics: Count Your Pulse:  -Group instruction provided by verbal instruction, demonstration, patient participation and written materials to support subject.  Instructors address importance of being able to find your pulse and how to count your pulse when at home without a heart monitor.  Patients get hands on experience counting their pulse with staff help and individually.          CARDIAC REHAB PHASE II EXERCISE from 07/08/2015 in [place]   Date  07/08/15   Educator  Maurice Small, RN   Instruction Review Code  2- meets goals/outcomes      Heart Attack, Angina, and Risk Factor Modification:  -Group instruction provided by verbal instruction, video, and written materials to support subject.  Instructors address signs and symptoms of angina and heart attacks.    Also discuss risk factors for heart disease and how to make changes to improve heart health risk factors.   Functional Fitness:  -Group instruction provided by verbal instruction, demonstration, patient participation, and written materials to support subject.  Instructors address safety measures for doing things around the house.  Discuss how to get up and down off the floor, how to pick things up properly, how to safely get out of a chair without assistance, and balance training.   Meditation and Mindfulness:  -Group instruction provided by  verbal instruction, patient participation, and written materials to support subject.  Instructor addresses importance of mindfulness and meditation practice to help reduce stress and improve awareness.  Instructor also leads participants through a meditation exercise.       CARDIAC REHAB PHASE II EXERCISE from 07/08/2015 in [place]   Date  07/06/15   Instruction Review Code  2- meets goals/outcomes      Stretching for Flexibility and Mobility:  -Group instruction provided by verbal instruction, patient participation, and written materials to support subject.  Instructors lead participants through series of stretches that are designed to increase flexibility thus improving mobility.  These stretches are additional exercise for major muscle groups that are typically performed during regular warm up and cool down.   Hands Only CPR Anytime:  -Group instruction provided by verbal instruction, video, patient participation and written materials to support subject.  Instructors co-teach with AHA video for hands only CPR.  Participants get hands on experience with mannequins.   Nutrition I class: Heart Healthy Eating:  -Group instruction provided by PowerPoint slides, verbal discussion, and written materials to support subject matter. The instructor gives an explanation and review of the Therapeutic Lifestyle Changes diet recommendations, which includes a discussion on lipid goals, dietary fat, sodium, fiber, plant stanol/sterol esters, sugar, and the components of a well-balanced, healthy diet.   Nutrition II class: Lifestyle Skills:  -Group instruction provided by PowerPoint slides, verbal discussion, and written materials to support subject matter. The instructor gives an explanation and review of label reading, grocery shopping for heart health, heart healthy recipe modifications, and ways to make healthier choices when eating out.   Diabetes Question & Answer:   -Group instruction provided by PowerPoint slides, verbal discussion, and written materials to support subject matter. The instructor gives an explanation and review of diabetes co-morbidities, pre- and post-prandial blood glucose goals, pre-exercise blood glucose  goals, signs, symptoms, and treatment of hypoglycemia and hyperglycemia, and foot care basics.   Diabetes Blitz:  -Group instruction provided by PowerPoint slides, verbal discussion, and written materials to support subject matter. The instructor gives an explanation and review of the physiology behind type 1 and type 2 diabetes, diabetes medications and rational behind using different medications, pre- and post-prandial blood glucose recommendations and Hemoglobin A1c goals, diabetes diet, and exercise including blood glucose guidelines for exercising safely.    Portion Distortion:  -Group instruction provided by PowerPoint slides, verbal discussion, written materials, and food models to support subject matter. The instructor gives an explanation of serving size versus portion size, changes in portions sizes over the last 20 years, and what consists of a serving from each food group.      CARDIAC REHAB PHASE II EXERCISE from 07/08/2015 in [place]   Date  06/29/15   Educator  RD   Instruction Review Code  2- meets goals/outcomes      Stress Management:  -Group instruction provided by verbal instruction, video, and written materials to support subject matter.  Instructors review role of stress in heart disease and how to cope with stress positively.     Exercising on Your Own:  -Group instruction provided by verbal instruction, power point, and written materials to support subject.  Instructors discuss benefits of exercise, components of exercise, frequency and intensity of exercise, and end points for exercise.  Also discuss use of nitroglycerin and activating EMS.  Review options of places to exercise  outside of rehab.  Review guidelines for sex with heart disease.   Cardiac Drugs I:  -Group instruction provided by verbal instruction and written materials to support subject.  Instructor reviews cardiac drug classes: antiplatelets, anticoagulants, beta blockers, and statins.  Instructor discusses reasons, side effects, and lifestyle considerations for each drug class.   Cardiac Drugs II:  -Group instruction provided by verbal instruction and written materials to support subject.  Instructor reviews cardiac drug classes: angiotensin converting enzyme inhibitors (ACE-I), angiotensin II receptor blockers (ARBs), nitrates, and calcium channel blockers.  Instructor discusses reasons, side effects, and lifestyle considerations for each drug class.   Anatomy and Physiology of the Circulatory System:  -Group instruction provided by verbal instruction, video, and written materials to support subject.  Reviews functional anatomy of heart, how it relates to various diagnoses, and what role the heart plays in the overall system.   Knowledge Questionnaire Score:     Knowledge Questionnaire Score - 06/14/15 1712    Knowledge Questionnaire Score   Pre Score 22/24      Core Components/Risk Factors/Patient Goals at Admission:     Personal Goals and Risk Factors at Admission - 06/14/15 1634    Core Components/Risk Factors/Patient Goals on Admission   Sedentary Yes   Intervention Provide advice, education, support and counseling about physical activity/exercise needs.;Develop an individualized exercise prescription for aerobic and resistive training based on initial evaluation findings, risk stratification, comorbidities and participant's personal goals.   Expected Outcomes Achievement of increased cardiorespiratory fitness and enhanced flexibility, muscular endurance and strength shown through measurements of functional capacity and personal statement of participant.   Increase Strength and Stamina  Yes   Intervention Provide advice, education, support and counseling about physical activity/exercise needs.;Develop an individualized exercise prescription for aerobic and resistive training based on initial evaluation findings, risk stratification, comorbidities and participant's personal goals.   Expected Outcomes Achievement of increased cardiorespiratory fitness and enhanced flexibility, muscular endurance and  strength shown through measurements of functional capacity and personal statement of participant.      Core Components/Risk Factors/Patient Goals Review:      Goals and Risk Factor Review      07/11/15 1212           Core Components/Risk Factors/Patient Goals Review   Personal Goals Review Other       Review Feels better now than before he had his MI in December. Discussed exercise and education for reduction of risk factors for cardiac event       Expected Outcomes Pt will continue to feel good, exercise and live a healthy lifestyle          Core Components/Risk Factors/Patient Goals at Discharge (Final Review):      Goals and Risk Factor Review - 07/11/15 1212    Core Components/Risk Factors/Patient Goals Review   Personal Goals Review Other   Review Feels better now than before he had his MI in December. Discussed exercise and education for reduction of risk factors for cardiac event   Expected Outcomes Pt will continue to feel good, exercise and live a healthy lifestyle      ITP Comments:     ITP Comments      03/16/15 0901 06/14/15 1546         ITP Comments see paper chart for previous documentation    Dr. Fransico Him, MD Medical Director Dr. Fransico Him, Medical Director          Comments:  Pt is making expected progress toward personal goals after completing 11 sessions.   Pt continues to exhibit positive coping skills with no psychosocial needs with no further intervention warranted. Pt has good support system in place. Recommend continued exercise  and life style modification education including  stress management and relaxation techniques to decrease cardiac risk profile. Cherre Huger, BSN

## 2015-07-22 ENCOUNTER — Other Ambulatory Visit: Payer: Self-pay | Admitting: Physician Assistant

## 2015-07-22 ENCOUNTER — Encounter (HOSPITAL_COMMUNITY)
Admission: RE | Admit: 2015-07-22 | Discharge: 2015-07-22 | Disposition: A | Discharge status: Home / Self Care | Payer: 59 | Source: Ambulatory Visit | Attending: Cardiology | Admitting: Cardiology

## 2015-07-22 ENCOUNTER — Telehealth: Payer: Self-pay | Admitting: Physician Assistant

## 2015-07-22 DIAGNOSIS — Z951 Presence of aortocoronary bypass graft: Secondary | ICD-10-CM

## 2015-07-22 DIAGNOSIS — I213 ST elevation (STEMI) myocardial infarction of unspecified site: Secondary | ICD-10-CM | POA: Diagnosis not present

## 2015-07-22 DIAGNOSIS — I480 Paroxysmal atrial fibrillation: Secondary | ICD-10-CM

## 2015-07-22 NOTE — Telephone Encounter (Signed)
Patient had brief run of what appears to be atrial fibrillation during cardiac rehab while exercising on elliptical. Please arrange 30 day event monitor to further evaluation. He is on ASA and plavix after CABG, but not on systemic anticoagulation.    He had h/o CABG in 04/2015, also have RLE DVT finished course of Xarelto in the past. I will ask Cardiac rehab to fax the strip to office direct to Dr. Martinique.   Hilbert Corrigan PA Pager: [phone]

## 2015-07-22 NOTE — Progress Notes (Signed)
Patient had onset of atrial-fibrillation/a-flutter post exercise, during cool-down phase of session. HR varied 120's -140s.  Asymptomatic. Noted patient's weight to be up 2 kg from Wednesday weight at Cardiac rehab. Patient also exercised on the elliptical/cross-trainer his third station of exercise for about 5 minutes which he reported to be his most difficult part of workout since his by-pass. He shared that he ate out last night at a [employer] and admitted that he has not been prudent with best diet choices this summer. BP taken post cool-down 105/64.  During assessment and discussion with patient he returned to SR but then for a brief period back into a-fib then back to SR rate 70-73. Almyra Deforest, PA-C reviewed ecg tracings, medications, and discussed with patient. ECG tracings were faxed to Dr. Martinique for review. Rehab staff reinforced heart healthy diet compliance. Patient excused from department in stable condition with instructions from [employer], PA-C  as well as follow-up from Dr. Doug Sou office.

## 2015-07-25 ENCOUNTER — Encounter (HOSPITAL_COMMUNITY)
Admission: RE | Admit: 2015-07-25 | Discharge: 2015-07-25 | Disposition: A | Discharge status: Home / Self Care | Payer: 59 | Source: Ambulatory Visit | Attending: Cardiology | Admitting: Cardiology

## 2015-07-25 DIAGNOSIS — Z951 Presence of aortocoronary bypass graft: Secondary | ICD-10-CM

## 2015-07-25 DIAGNOSIS — I213 ST elevation (STEMI) myocardial infarction of unspecified site: Secondary | ICD-10-CM | POA: Diagnosis not present

## 2015-07-26 ENCOUNTER — Ambulatory Visit (INDEPENDENT_AMBULATORY_CARE_PROVIDER_SITE_OTHER): Payer: 59

## 2015-07-26 DIAGNOSIS — I480 Paroxysmal atrial fibrillation: Secondary | ICD-10-CM

## 2015-07-27 ENCOUNTER — Encounter (HOSPITAL_COMMUNITY)
Admission: RE | Admit: 2015-07-27 | Discharge: 2015-07-27 | Disposition: A | Discharge status: Home / Self Care | Payer: 59 | Source: Ambulatory Visit | Attending: Cardiology | Admitting: Cardiology

## 2015-07-27 DIAGNOSIS — Z951 Presence of aortocoronary bypass graft: Secondary | ICD-10-CM

## 2015-07-27 DIAGNOSIS — I213 ST elevation (STEMI) myocardial infarction of unspecified site: Secondary | ICD-10-CM | POA: Diagnosis not present

## 2015-07-29 ENCOUNTER — Encounter (HOSPITAL_COMMUNITY)
Admission: RE | Admit: 2015-07-29 | Discharge: 2015-07-29 | Disposition: A | Discharge status: Home / Self Care | Payer: 59 | Source: Ambulatory Visit | Attending: Cardiology | Admitting: Cardiology

## 2015-07-29 DIAGNOSIS — I213 ST elevation (STEMI) myocardial infarction of unspecified site: Secondary | ICD-10-CM | POA: Diagnosis not present

## 2015-07-29 DIAGNOSIS — Z951 Presence of aortocoronary bypass graft: Secondary | ICD-10-CM

## 2015-07-29 NOTE — Progress Notes (Signed)
Ryan Ward 70 y.o. male Nutrition Note Spoke with pt. Nutrition Survey reviewed with pt. Pt is following Step 2 of the Therapeutic Lifestyle Changes diet. Pt is pre-diabetic. Pre-diabetes discussed. Pt is watching his sodium intake more carefully due to CHF. Pt continues to eat out for lunch 4 meals/week and "watches" what he eats. Pt expressed understanding of the information reviewed. Pt aware of nutrition education classes offered. Lab Results  Component Value Date   HGBA1C 5.9 (H) 12/07/2014   Wt Readings from Last 3 Encounters:  06/16/15 182 lb (82.6 kg)  06/14/15 179 lb 10.8 oz (81.5 kg)  06/08/15 182 lb 9.6 oz (82.8 kg)   Nutrition Diagnosis ? Food-and nutrition-related knowledge deficit related to lack of exposure to information as related to diagnosis of: ? CVD ? Pre-DM  Nutrition Intervention ? Benefits of adopting Therapeutic Lifestyle Changes discussed when Medficts reviewed. ? Pt to attend the Portion Distortion class ? Pt to attend the  ? Nutrition I class                          ? Nutrition II class ? Pt given handouts for: ? Nutrition II class ? Pre-diabetes ? Continue client-centered nutrition education by RD, as part of interdisciplinary care.  Goal(s) ? Pt to describe the benefit of including fruits, vegetables, whole grains, and low-fat dairy products in a heart healthy meal plan.  Monitor and Evaluate progress toward nutrition goal with team.  Derek Mound, M.Ed, RD, LDN, CDE 07/29/2015 8:28 AM

## 2015-08-01 ENCOUNTER — Encounter (HOSPITAL_COMMUNITY)
Admission: RE | Admit: 2015-08-01 | Discharge: 2015-08-01 | Disposition: A | Discharge status: Home / Self Care | Payer: 59 | Source: Ambulatory Visit | Attending: Cardiology | Admitting: Cardiology

## 2015-08-01 DIAGNOSIS — Z951 Presence of aortocoronary bypass graft: Secondary | ICD-10-CM

## 2015-08-01 DIAGNOSIS — I213 ST elevation (STEMI) myocardial infarction of unspecified site: Secondary | ICD-10-CM | POA: Diagnosis not present

## 2015-08-02 ENCOUNTER — Other Ambulatory Visit: Payer: Self-pay | Admitting: Student

## 2015-08-02 NOTE — Telephone Encounter (Signed)
Rx request sent to pharmacy.  

## 2015-08-03 ENCOUNTER — Encounter (HOSPITAL_COMMUNITY)
Admission: RE | Admit: 2015-08-03 | Discharge: 2015-08-03 | Disposition: A | Discharge status: Home / Self Care | Payer: 59 | Source: Ambulatory Visit | Attending: Cardiology | Admitting: Cardiology

## 2015-08-03 DIAGNOSIS — Z955 Presence of coronary angioplasty implant and graft: Secondary | ICD-10-CM | POA: Diagnosis present

## 2015-08-03 DIAGNOSIS — Z951 Presence of aortocoronary bypass graft: Secondary | ICD-10-CM

## 2015-08-03 DIAGNOSIS — I213 ST elevation (STEMI) myocardial infarction of unspecified site: Secondary | ICD-10-CM | POA: Diagnosis present

## 2015-08-03 NOTE — Telephone Encounter (Signed)
amiodarone (PACERONE) 200 MG tablet  Medication  Date: 08/02/2015 Department: [facility] Ordering/Authorizing: Peter M Martinique, MD  Order Providers   Prescribing Provider Encounter Provider  Peter M Martinique, MD Erma Heritage, [place]  Medication Detail    Disp Refills Start End   amiodarone (PACERONE) 200 MG tablet 60 tablet 6 08/02/2015    Sig: TAKE 1 TABLET TWICE A DAY X1WK THEN STARTING 12/21 TAKE 1 TABLET ONCE DAILY   E-Prescribing Status: Receipt confirmed by pharmacy (08/02/2015 3:38 PM EDT)   Pharmacy   [facility] #[id number] - [place], [place] - [address]

## 2015-08-05 ENCOUNTER — Encounter: Payer: Self-pay | Admitting: Cardiology

## 2015-08-05 ENCOUNTER — Encounter (HOSPITAL_COMMUNITY)
Admission: RE | Admit: 2015-08-05 | Discharge: 2015-08-05 | Disposition: A | Discharge status: Home / Self Care | Payer: 59 | Source: Ambulatory Visit | Attending: Cardiology | Admitting: Cardiology

## 2015-08-05 DIAGNOSIS — Z951 Presence of aortocoronary bypass graft: Secondary | ICD-10-CM

## 2015-08-05 DIAGNOSIS — I213 ST elevation (STEMI) myocardial infarction of unspecified site: Secondary | ICD-10-CM | POA: Diagnosis not present

## 2015-08-08 ENCOUNTER — Encounter (HOSPITAL_COMMUNITY)
Admission: RE | Admit: 2015-08-08 | Discharge: 2015-08-08 | Disposition: A | Discharge status: Home / Self Care | Payer: 59 | Source: Ambulatory Visit | Attending: Cardiology | Admitting: Cardiology

## 2015-08-08 ENCOUNTER — Other Ambulatory Visit: Payer: Self-pay | Admitting: Cardiology

## 2015-08-08 DIAGNOSIS — I213 ST elevation (STEMI) myocardial infarction of unspecified site: Secondary | ICD-10-CM | POA: Diagnosis not present

## 2015-08-08 DIAGNOSIS — Z951 Presence of aortocoronary bypass graft: Secondary | ICD-10-CM

## 2015-08-10 ENCOUNTER — Encounter (HOSPITAL_COMMUNITY)
Admission: RE | Admit: 2015-08-10 | Discharge: 2015-08-10 | Disposition: A | Discharge status: Home / Self Care | Payer: 59 | Source: Ambulatory Visit | Attending: Cardiology | Admitting: Cardiology

## 2015-08-10 DIAGNOSIS — I213 ST elevation (STEMI) myocardial infarction of unspecified site: Secondary | ICD-10-CM | POA: Diagnosis not present

## 2015-08-10 DIAGNOSIS — Z951 Presence of aortocoronary bypass graft: Secondary | ICD-10-CM

## 2015-08-12 ENCOUNTER — Encounter (HOSPITAL_COMMUNITY): Payer: 59

## 2015-08-12 ENCOUNTER — Encounter (HOSPITAL_COMMUNITY)
Admission: RE | Admit: 2015-08-12 | Discharge: 2015-08-12 | Disposition: A | Discharge status: Home / Self Care | Payer: 59 | Source: Ambulatory Visit | Attending: Cardiology | Admitting: Cardiology

## 2015-08-12 DIAGNOSIS — Z951 Presence of aortocoronary bypass graft: Secondary | ICD-10-CM

## 2015-08-12 DIAGNOSIS — I213 ST elevation (STEMI) myocardial infarction of unspecified site: Secondary | ICD-10-CM | POA: Diagnosis not present

## 2015-08-15 ENCOUNTER — Encounter (HOSPITAL_COMMUNITY)
Admission: RE | Admit: 2015-08-15 | Discharge: 2015-08-15 | Disposition: A | Discharge status: Home / Self Care | Payer: 59 | Source: Ambulatory Visit | Attending: Cardiology | Admitting: Cardiology

## 2015-08-15 DIAGNOSIS — I213 ST elevation (STEMI) myocardial infarction of unspecified site: Secondary | ICD-10-CM | POA: Diagnosis not present

## 2015-08-15 DIAGNOSIS — Z951 Presence of aortocoronary bypass graft: Secondary | ICD-10-CM

## 2015-08-17 ENCOUNTER — Encounter (HOSPITAL_COMMUNITY): Payer: 59

## 2015-08-18 NOTE — Progress Notes (Signed)
Cardiac Individual Treatment Plan  Patient Details  Name: Ryan Ward MRN: [id number] Date of Birth: [birth year] Referring Provider:   Flowsheet Row CARDIAC REHAB PHASE II ORIENTATION from 06/14/2015 in [place]  Referring Provider  Martinique, Peter MD      Initial Encounter Date:  [place] PHASE II ORIENTATION from 06/14/2015 in [place]  Date  06/14/15  Referring Provider  Martinique, Peter MD      Visit Diagnosis: S/P CABG (coronary artery bypass graft)  Patient's Home Medications on Admission:  Current Outpatient Prescriptions:  .  amiodarone (PACERONE) 200 MG tablet, TAKE 1 TABLET TWICE A DAY X1WK THEN STARTING 12/21 TAKE 1 TABLET ONCE DAILY, Disp: 60 tablet, Rfl: 6 .  amiodarone (PACERONE) 200 MG tablet, Take 1 tablet (200 mg total) by mouth daily., Disp: 90 tablet, Rfl: 2 .  aspirin EC 81 MG tablet, Take 1 tablet (81 mg total) by mouth daily., Disp: 90 tablet, Rfl: 3 .  atorvastatin (LIPITOR) 80 MG tablet, TAKE 1 TABLET (80 MG TOTAL) BY MOUTH DAILY AT 6 PM., Disp: 30 tablet, Rfl: 2 .  Cholecalciferol (VITAMIN D3) 5000 UNITS TABS, Take 5,000 Units by mouth daily. , Disp: , Rfl:  .  clopidogrel (PLAVIX) 75 MG tablet, Take 1 tablet (75 mg total) by mouth daily., Disp: 30 tablet, Rfl: 1 .  famotidine (PEPCID) 20 MG tablet, Take 20 mg by mouth 2 (two) times daily., Disp: , Rfl:  .  FEROCON capsule, TAKE 1 CAPSULE BY MOUTH 3 TIMES DAILY AFTER MEALS, Disp: 90 capsule, Rfl: 0 .  furosemide (LASIX) 40 MG tablet, Take 2 tablets in the AM 1 tablet in the PM, Disp: 270 tablet, Rfl: 3 .  levothyroxine (SYNTHROID, LEVOTHROID) 50 MCG tablet, Take 1 tablet (50 mcg total) by mouth 2 (two) times daily., Disp: 180 tablet, Rfl: 3 .  lisinopril (PRINIVIL,ZESTRIL) 2.5 MG tablet, Take 1 tablet (2.5 mg total) by mouth daily., Disp: 30 tablet, Rfl: 11 .  Multiple Vitamin (MULTIVITAMIN WITH MINERALS) TABS tablet, Take 1  tablet by mouth daily., Disp: , Rfl:  .  Potassium Chloride ER 20 MEQ TBCR, Take 40 mEq by mouth daily., Disp: 90 tablet, Rfl: 1 .  PROAIR HFA 108 (90 Base) MCG/ACT inhaler, , Disp: , Rfl:   Past Medical History: Past Medical History:  Diagnosis Date  . Asthma    VERY MILD  . CAD (coronary artery disease)    a. 12/07/14 Inf STEMI/PCI: LAD 85p/m, 20m/d, LCX 80ost, RCA 30p/m, 94m/d, 100d (4.0x20 Promus Premier DES). Hosp course complicated by VF arrest, CGS, CHB req temp wire; b. 03/2015 Cath: LAD 85p/m, 95d, LCx 80ost, RCA 30-40p/m/d, RPDA 75, EF 30-35%, Nl CO; c. 04/06/2015 CABGx4 (LIMA->LAD, VG->Diag, Seq VG->prox RPDA->mid RPDA.  [name] Chronic systolic CHF (congestive heart failure) ([place])    a. 03/2015 Echo: EF 30-35%, diff HK.  [name] Hyperlipidemia   . Hypertensive heart disease   . Ischemic cardiomyopathy    a. 03/2015 Echo: EF 30-35%, diff HK, mild MR, mod dil RA, PASP 9mmHg.  . OSA (obstructive sleep apnea)    a. not using CPAP at home, does have machine  . Paroxysmal atrial fibrillation ([place])    a. 12/2014 In setting of Inf MI->convertred spont.  . Pulmonary embolism ([place])    a. 12/2014 R PT and peroneal vein DVT and RUL PE-->Xarelto x 3 mos.    Tobacco Use: History  Smoking Status  . Never Smoker  Smokeless  Tobacco  . Never Used    Labs: Recent Review Flowsheet Data    Labs for ITP Cardiac and Pulmonary Rehab Latest Ref Rng & Units 04/09/2015 04/09/2015 04/10/2015 04/11/2015 06/01/2015   Cholestrol 125 - 200 mg/dL - - - - 92(L)   LDLCALC <130 mg/dL - - - - 48   HDL >=40 mg/dL - - - - 34(L)   Trlycerides <150 mg/dL - - - - 48   Hemoglobin A1c 4.8 - 5.6 % - - - - -   PHART 7.350 - 7.450 - - - - -   PCO2ART 35.0 - 45.0 mmHg - - - - -   HCO3 20.0 - 24.0 mEq/L - - - - -   TCO2 0 - 100 mmol/L - 29 30 - -   ACIDBASEDEF 0.0 - 2.0 mmol/L - - - - -   O2SAT % 57.1 - 42.8 63.1 -      Capillary Blood Glucose: Lab Results  Component Value Date   GLUCAP 95 04/13/2015   GLUCAP 81 04/13/2015    GLUCAP 104 (H) 04/12/2015   GLUCAP 86 04/12/2015   GLUCAP 89 04/12/2015     Exercise Target Goals:    Exercise Program Goal: Individual exercise prescription set with THRR, safety & activity barriers. Participant demonstrates ability to understand and report RPE using BORG scale, to self-measure pulse accurately, and to acknowledge the importance of the exercise prescription.  Exercise Prescription Goal: Starting with aerobic activity 30 plus minutes a day, 3 days per week for initial exercise prescription. Provide home exercise prescription and guidelines that participant acknowledges understanding prior to discharge.  Activity Barriers & Risk Stratification:     Activity Barriers & Cardiac Risk Stratification - 06/14/15 1712      Activity Barriers & Cardiac Risk Stratification   Activity Barriers None   Cardiac Risk Stratification High      6 Minute Walk:     6 Minute Walk    Row Name 06/14/15 1713         6 Minute Walk   Phase Initial     Distance 1639 feet     Walk Time 6 minutes     # of Rest Breaks 0     MPH 3.1     METS 3.3     RPE 13     VO2 Peak 11.56     Symptoms No     Resting HR 64 bpm     Resting BP 104/60     Max Ex. HR 89 bpm     Max Ex. BP 116/68     2 Minute Post BP 110/60        Initial Exercise Prescription:     Initial Exercise Prescription - 06/15/15 0800      Date of Initial Exercise RX and Referring Provider   Date 06/14/15   Referring Provider Martinique, Peter MD     Treadmill   MPH 2.5   Grade 1   Minutes 10   METs 3.26     Bike   Level 1   Minutes 10   METs 3.3     NuStep   Level 3   Minutes 10   METs 2.5     Prescription Details   Frequency (times per week) 3   Duration Progress to 30 minutes of continuous aerobic without signs/symptoms of physical distress     Intensity   THRR 40-80% of Max Heartrate 60-121   Ratings of Perceived Exertion 11-13  Progression   Progression Continue to progress workloads  to maintain intensity without signs/symptoms of physical distress.     Resistance Training   Training Prescription Yes   Weight 2 lb   Reps 10-12      Perform Capillary Blood Glucose checks as needed.  Exercise Prescription Changes:     Exercise Prescription Changes    Row Name 07/01/15 1500 07/11/15 1200 07/20/15 1600 07/27/15 1100 08/11/15 1600     Exercise Review   Progression Yes Yes Yes Yes Yes     Response to Exercise   Blood Pressure (Admit) 104/62 104/58 100/60 100/60 100/58   Blood Pressure (Exercise) 122/70 98/58 102/60 102/60 124/78   Blood Pressure (Exit) 94/60 86/52 100/59 100/59 100/60   Heart Rate (Admit) 74 bpm 73 bpm 80 bpm 80 bpm 79 bpm   Heart Rate (Exercise) 99 bpm 100 bpm 108 bpm 108 bpm 118 bpm   Heart Rate (Exit) 74 bpm 73 bpm 71 bpm 71 bpm 72 bpm   Rating of Perceived Exertion (Exercise) 11 11 11 11 11    Comments  - Reviewed HEP on 06/29/15 Reviewed HEP on 06/29/15 Reviewed HEP on 06/29/15 Reviewed HEP on 06/29/15   Duration Progress to 30 minutes of continuous aerobic without signs/symptoms of physical distress Progress to 30 minutes of continuous aerobic without signs/symptoms of physical distress Progress to 30 minutes of continuous aerobic without signs/symptoms of physical distress Progress to 30 minutes of continuous aerobic without signs/symptoms of physical distress Progress to 30 minutes of continuous aerobic without signs/symptoms of physical distress   Intensity THRR unchanged THRR unchanged THRR unchanged THRR unchanged THRR unchanged     Progression   Progression Continue to progress workloads to maintain intensity without signs/symptoms of physical distress. Continue to progress workloads to maintain intensity without signs/symptoms of physical distress. Continue to progress workloads to maintain intensity without signs/symptoms of physical distress. Continue to progress workloads to maintain intensity without signs/symptoms of physical distress.  Continue to progress workloads to maintain intensity without signs/symptoms of physical distress.   Average METs 3 3 3.1 3.1 3.4     Resistance Training   Training Prescription Yes Yes Yes Yes Yes   Weight 2lbs 4lbs 4lbs 4lbs 4lbs   Reps 10-12 10-12 10-12 10-12 10-12     Treadmill   MPH 2.5 2.5 2.5 2.5 2.8   Grade 1 1 1 1 1    Minutes 10 10 10 10 10    METs 3.26 3.26 3.26 3.26 3.53     Bike   Level 1 1 1.2 1.2 1.5   Minutes 10 10 10 10 10    METs 3.3 3.3 3.23 3.23 4.35     NuStep   Level 3 3 3 3 4    Minutes 10 10 10 10 10    METs 2.7 2.8 3 3  3.3     Home Exercise Plan   Plans to continue exercise at  - Home  Plans to walk and use elliptical. See progress note Home  Plans to walk and use elliptical. See progress note Home  Plans to walk and use elliptical. See progress note Home  Plans to walk and use elliptical. See progress note   Frequency  - Add 2 additional days to program exercise sessions.  2-3x/week Add 2 additional days to program exercise sessions.  2-3x/week Add 2 additional days to program exercise sessions.  2-3x/week Add 2 additional days to program exercise sessions.  2-3x/week      Exercise Comments:  Exercise Comments    Row Name 07/11/15 1215 08/11/15 1625         Exercise Comments Reviewed METs and goals. Pt is tolerating exercise well at [facility], will continue to monitor exercise progression. Reviewed METs and goals. Pt is tolerating exercise well at [facility], will continue to monitor exercise progression.         Discharge Exercise Prescription (Final Exercise Prescription Changes):     Exercise Prescription Changes - 08/11/15 1600      Exercise Review   Progression Yes     Response to Exercise   Blood Pressure (Admit) 100/58   Blood Pressure (Exercise) 124/78   Blood Pressure (Exit) 100/60   Heart Rate (Admit) 79 bpm   Heart Rate (Exercise) 118 bpm   Heart Rate (Exit) 72 bpm   Rating of Perceived Exertion (Exercise) 11   Comments  Reviewed HEP on 06/29/15   Duration Progress to 30 minutes of continuous aerobic without signs/symptoms of physical distress   Intensity THRR unchanged     Progression   Progression Continue to progress workloads to maintain intensity without signs/symptoms of physical distress.   Average METs 3.4     Resistance Training   Training Prescription Yes   Weight 4lbs   Reps 10-12     Treadmill   MPH 2.8   Grade 1   Minutes 10   METs 3.53     Bike   Level 1.5   Minutes 10   METs 4.35     NuStep   Level 4   Minutes 10   METs 3.3     Home Exercise Plan   Plans to continue exercise at Home  Plans to walk and use elliptical. See progress note   Frequency Add 2 additional days to program exercise sessions.  2-3x/week      Nutrition:  Target Goals: Understanding of nutrition guidelines, daily intake of sodium 1500mg , cholesterol 200mg , calories 30% from fat and 7% or less from saturated fats, daily to have 5 or more servings of fruits and vegetables.  Biometrics:     Pre Biometrics - 06/14/15 1711      Pre Biometrics   Height 5\' 7"  (1.702 m)   Weight 179 lb 10.8 oz (81.5 kg)   Waist Circumference 36.75 inches   Hip Circumference 40 inches   Waist to Hip Ratio 0.92 %   BMI (Calculated) 28.2   Triceps Skinfold 13 mm   % Body Fat 25.7 %   Grip Strength 48.5 kg   Flexibility 9 in   Single Leg Stand 30 seconds       Nutrition Therapy Plan and Nutrition Goals:     Nutrition Therapy & Goals - 06/20/15 1022      Nutrition Therapy   Diet Therapeutic Lifestyle Changes     Personal Nutrition Goals   Personal Goal #1 Maintain wt around 182 lb while in [place], educate and counsel regarding individualized specific dietary modifications aiming towards targeted core components such as weight, hypertension, lipid management, diabetes, heart failure and other comorbidities.   Expected Outcomes Short Term Goal:  Understand basic principles of dietary content, such as calories, fat, sodium, cholesterol and nutrients.;Long Term Goal: Adherence to prescribed nutrition plan.      Nutrition Discharge: Nutrition Scores:     Nutrition Assessments - 06/20/15 1022      MEDFICTS Scores   Pre Score 18      Nutrition Goals  Re-Evaluation:     Nutrition Goals Re-Evaluation    [place] Name 07/29/15 [phone]             Personal Goal #1 Re-Evaluation   Personal Goal #1 Maintain wt around 182 lb while in Cardiac Rehab         Intervention Plan   Intervention Continue to educate, counsel and set short/long term goals regarding individualized specific personal dietary modifications.;Nutrition handout(s) given to patient.  Pre-diabetes handout given          Psychosocial: Target Goals: Acknowledge presence or absence of depression, maximize coping skills, provide positive support system. Participant is able to verbalize types and ability to use techniques and skills needed for reducing stress and depression.  Initial Review & Psychosocial Screening:     Initial Psych Review & Screening - 06/14/15 [place]? Yes     Barriers   Psychosocial barriers to participate in program The patient should benefit from training in stress management and relaxation.     Screening Interventions   Interventions Encouraged to exercise      Quality of Life Scores:     Quality of Life - 06/14/15 1713      Quality of Life Scores   Health/Function Pre 24.7 %   Socioeconomic Pre 28.07 %   Psych/Spiritual Pre 25 %   Family Pre 28.8 %   GLOBAL Pre 26.06 %      PHQ-9: Recent Review Flowsheet Data    Depression screen [facility] 2/9 06/20/2015 02/02/2015   Decreased Interest 0 0   Down, Depressed, Hopeless 0 0   PHQ - 2 Score 0 0      Psychosocial Evaluation and Intervention:   Psychosocial Re-Evaluation:   Vocational Rehabilitation: Provide vocational rehab assistance to  qualifying candidates.   Vocational Rehab Evaluation & Intervention:   Education: Education Goals: Education classes will be provided on a weekly basis, covering required topics. Participant will state understanding/return demonstration of topics presented.  Learning Barriers/Preferences:     Learning Barriers/Preferences - 06/14/15 1634      Learning Barriers/Preferences   Learning Barriers None;Reading   Learning Preferences Skilled Demonstration      Education Topics: Count Your Pulse:  -Group instruction provided by verbal instruction, demonstration, patient participation and written materials to support subject.  Instructors address importance of being able to find your pulse and how to count your pulse when at home without a heart monitor.  Patients get hands on experience counting their pulse with staff help and individually. Flowsheet Row CARDIAC REHAB PHASE II EXERCISE from 08/12/2015 in [place]  Date  07/08/15  Educator  Maurice Small, RN  Instruction Review Code  2- meets goals/outcomes      Heart Attack, Angina, and Risk Factor Modification:  -Group instruction provided by verbal instruction, video, and written materials to support subject.  Instructors address signs and symptoms of angina and heart attacks.    Also discuss risk factors for heart disease and how to make changes to improve heart health risk factors.   Functional Fitness:  -Group instruction provided by verbal instruction, demonstration, patient participation, and written materials to support subject.  Instructors address safety measures for doing things around the house.  Discuss how to get up and down off the floor, how to pick things up properly, how to safely get out of a chair without assistance, and balance training.   Meditation and Mindfulness:  -  Group instruction provided by verbal instruction, patient participation, and written materials to support  subject.  Instructor addresses importance of mindfulness and meditation practice to help reduce stress and improve awareness.  Instructor also leads participants through a meditation exercise.  Flowsheet Row CARDIAC REHAB PHASE II EXERCISE from 08/12/2015 in [place]  Date  07/06/15  Instruction Review Code  2- meets goals/outcomes      Stretching for Flexibility and Mobility:  -Group instruction provided by verbal instruction, patient participation, and written materials to support subject.  Instructors lead participants through series of stretches that are designed to increase flexibility thus improving mobility.  These stretches are additional exercise for major muscle groups that are typically performed during regular warm up and cool down. Flowsheet Row CARDIAC REHAB PHASE II EXERCISE from 08/12/2015 in [place]  Date  07/29/15  Instruction Review Code  2- meets goals/outcomes      Hands Only CPR Anytime:  -Group instruction provided by verbal instruction, video, patient participation and written materials to support subject.  Instructors co-teach with AHA video for hands only CPR.  Participants get hands on experience with mannequins.   Nutrition I class: Heart Healthy Eating:  -Group instruction provided by PowerPoint slides, verbal discussion, and written materials to support subject matter. The instructor gives an explanation and review of the Therapeutic Lifestyle Changes diet recommendations, which includes a discussion on lipid goals, dietary fat, sodium, fiber, plant stanol/sterol esters, sugar, and the components of a well-balanced, healthy diet.   Nutrition II class: Lifestyle Skills:  -Group instruction provided by PowerPoint slides, verbal discussion, and written materials to support subject matter. The instructor gives an explanation and review of label reading, grocery shopping for heart health, heart healthy  recipe modifications, and ways to make healthier choices when eating out. Flowsheet Row CARDIAC REHAB PHASE II EXERCISE from 08/12/2015 in [place]  Date  07/29/15  Educator  RD  Instruction Review Code  Not applicable [class handouts given]      Diabetes Question & Answer:  -Group instruction provided by PowerPoint slides, verbal discussion, and written materials to support subject matter. The instructor gives an explanation and review of diabetes co-morbidities, pre- and post-prandial blood glucose goals, pre-exercise blood glucose goals, signs, symptoms, and treatment of hypoglycemia and hyperglycemia, and foot care basics. Flowsheet Row CARDIAC REHAB PHASE II EXERCISE from 08/12/2015 in [place]  Date  08/12/15  Educator  RD  Instruction Review Code  2- meets goals/outcomes      Diabetes Blitz:  -Group instruction provided by PowerPoint slides, verbal discussion, and written materials to support subject matter. The instructor gives an explanation and review of the physiology behind type 1 and type 2 diabetes, diabetes medications and rational behind using different medications, pre- and post-prandial blood glucose recommendations and Hemoglobin A1c goals, diabetes diet, and exercise including blood glucose guidelines for exercising safely.    Portion Distortion:  -Group instruction provided by PowerPoint slides, verbal discussion, written materials, and food models to support subject matter. The instructor gives an explanation of serving size versus portion size, changes in portions sizes over the last 20 years, and what consists of a serving from each food group. Flowsheet Row CARDIAC REHAB PHASE II EXERCISE from 08/12/2015 in [place]  Date  08/10/15  Educator  RD  Instruction Review Code  2- meets goals/outcomes      Stress Management:  -  Group instruction provided by verbal  instruction, video, and written materials to support subject matter.  Instructors review role of stress in heart disease and how to cope with stress positively.   Flowsheet Row CARDIAC REHAB PHASE II EXERCISE from 08/12/2015 in [place]  Date  08/03/15  Instruction Review Code  2- meets goals/outcomes      Exercising on Your Own:  -Group instruction provided by verbal instruction, power point, and written materials to support subject.  Instructors discuss benefits of exercise, components of exercise, frequency and intensity of exercise, and end points for exercise.  Also discuss use of nitroglycerin and activating EMS.  Review options of places to exercise outside of rehab.  Review guidelines for sex with heart disease. Flowsheet Row CARDIAC REHAB PHASE II EXERCISE from 08/12/2015 in [place]  Date  07/27/15  Instruction Review Code  2- meets goals/outcomes      Cardiac Drugs I:  -Group instruction provided by verbal instruction and written materials to support subject.  Instructor reviews cardiac drug classes: antiplatelets, anticoagulants, beta blockers, and statins.  Instructor discusses reasons, side effects, and lifestyle considerations for each drug class.   Cardiac Drugs II:  -Group instruction provided by verbal instruction and written materials to support subject.  Instructor reviews cardiac drug classes: angiotensin converting enzyme inhibitors (ACE-I), angiotensin II receptor blockers (ARBs), nitrates, and calcium channel blockers.  Instructor discusses reasons, side effects, and lifestyle considerations for each drug class.   Anatomy and Physiology of the Circulatory System:  -Group instruction provided by verbal instruction, video, and written materials to support subject.  Reviews functional anatomy of heart, how it relates to various diagnoses, and what role the heart plays in the overall system.   Knowledge  Questionnaire Score:     Knowledge Questionnaire Score - 06/14/15 1712      Knowledge Questionnaire Score   Pre Score 22/24      Core Components/Risk Factors/Patient Goals at Admission:     Personal Goals and Risk Factors at Admission - 06/14/15 1634      Core Components/Risk Factors/Patient Goals on Admission   Sedentary Yes   Intervention Provide advice, education, support and counseling about physical activity/exercise needs.;Develop an individualized exercise prescription for aerobic and resistive training based on initial evaluation findings, risk stratification, comorbidities and participant's personal goals.   Expected Outcomes Achievement of increased cardiorespiratory fitness and enhanced flexibility, muscular endurance and strength shown through measurements of functional capacity and personal statement of participant.   Increase Strength and Stamina Yes   Intervention Provide advice, education, support and counseling about physical activity/exercise needs.;Develop an individualized exercise prescription for aerobic and resistive training based on initial evaluation findings, risk stratification, comorbidities and participant's personal goals.   Expected Outcomes Achievement of increased cardiorespiratory fitness and enhanced flexibility, muscular endurance and strength shown through measurements of functional capacity and personal statement of participant.      Core Components/Risk Factors/Patient Goals Review:      Goals and Risk Factor Review    Row Name 07/11/15 1212 08/11/15 1625           Core Components/Risk Factors/Patient Goals Review   Personal Goals Review Other Other;Increase Strength and Stamina      Review Feels better now than before he had his MI in December. Discussed exercise and education for reduction of risk factors for cardiac event Gaining knowledge on CV health. Great support system at home and in rehab. Stamina and energy is  improving as well       Expected Outcomes Pt will continue to feel good, exercise and live a healthy lifestyle Pt will continue to improve in stamina and energy, and have increase awareness and knowledge on CV diseases/health         Core Components/Risk Factors/Patient Goals at Discharge (Final Review):      Goals and Risk Factor Review - 08/11/15 1625      Core Components/Risk Factors/Patient Goals Review   Personal Goals Review Other;Increase Strength and Stamina   Review Gaining knowledge on CV health. Great support system at home and in rehab. Stamina and energy is improving as well   Expected Outcomes Pt will continue to improve in stamina and energy, and have increase awareness and knowledge on CV diseases/health      ITP Comments:     ITP Comments    Row Name 03/16/15 0901 06/14/15 1546 08/05/15 0813       ITP Comments see paper chart for previous documentation    Dr. Fransico Him, MD Medical Director Dr. Fransico Him, Medical Director  08/05/15 Patient attended Hypertension education class. Meets goals/outcomes.        Comments:  Pt is making expected progress toward personal goals after completing 24 sessions. Recommend continued exercise and life style modification education including  stress management and relaxation techniques to decrease cardiac risk profile. Psychosocial Assessment remains unchanged from previous assessment, pt has supportive family with no psychosocial needs, no further intervention warranted at this time. Maurice Small RN

## 2015-08-19 ENCOUNTER — Encounter (HOSPITAL_COMMUNITY): Payer: 59

## 2015-08-22 ENCOUNTER — Encounter (HOSPITAL_COMMUNITY)
Admission: RE | Admit: 2015-08-22 | Discharge: 2015-08-22 | Disposition: A | Discharge status: Home / Self Care | Payer: 59 | Source: Ambulatory Visit | Attending: Cardiology | Admitting: Cardiology

## 2015-08-22 DIAGNOSIS — I213 ST elevation (STEMI) myocardial infarction of unspecified site: Secondary | ICD-10-CM | POA: Diagnosis not present

## 2015-08-22 DIAGNOSIS — Z951 Presence of aortocoronary bypass graft: Secondary | ICD-10-CM

## 2015-08-24 ENCOUNTER — Encounter (HOSPITAL_COMMUNITY)
Admission: RE | Admit: 2015-08-24 | Discharge: 2015-08-24 | Disposition: A | Discharge status: Home / Self Care | Payer: 59 | Source: Ambulatory Visit | Attending: Cardiology | Admitting: Cardiology

## 2015-08-24 DIAGNOSIS — Z951 Presence of aortocoronary bypass graft: Secondary | ICD-10-CM

## 2015-08-24 DIAGNOSIS — I213 ST elevation (STEMI) myocardial infarction of unspecified site: Secondary | ICD-10-CM | POA: Diagnosis not present

## 2015-08-26 ENCOUNTER — Encounter (HOSPITAL_COMMUNITY)
Admission: RE | Admit: 2015-08-26 | Discharge: 2015-08-26 | Disposition: A | Discharge status: Home / Self Care | Payer: 59 | Source: Ambulatory Visit | Attending: Cardiology | Admitting: Cardiology

## 2015-08-26 DIAGNOSIS — Z951 Presence of aortocoronary bypass graft: Secondary | ICD-10-CM

## 2015-08-26 DIAGNOSIS — I213 ST elevation (STEMI) myocardial infarction of unspecified site: Secondary | ICD-10-CM | POA: Diagnosis not present

## 2015-08-28 ENCOUNTER — Other Ambulatory Visit: Payer: Self-pay | Admitting: Cardiology

## 2015-08-29 ENCOUNTER — Encounter (HOSPITAL_COMMUNITY)
Admission: RE | Admit: 2015-08-29 | Discharge: 2015-08-29 | Disposition: A | Discharge status: Home / Self Care | Payer: 59 | Source: Ambulatory Visit | Attending: Cardiology | Admitting: Cardiology

## 2015-08-29 DIAGNOSIS — I213 ST elevation (STEMI) myocardial infarction of unspecified site: Secondary | ICD-10-CM | POA: Diagnosis not present

## 2015-08-29 DIAGNOSIS — Z951 Presence of aortocoronary bypass graft: Secondary | ICD-10-CM

## 2015-08-29 NOTE — Telephone Encounter (Signed)
Rx request sent to pharmacy.  

## 2015-08-31 ENCOUNTER — Encounter (HOSPITAL_COMMUNITY)
Admission: RE | Admit: 2015-08-31 | Discharge: 2015-08-31 | Disposition: A | Discharge status: Home / Self Care | Payer: 59 | Source: Ambulatory Visit | Attending: Cardiology | Admitting: Cardiology

## 2015-08-31 DIAGNOSIS — I213 ST elevation (STEMI) myocardial infarction of unspecified site: Secondary | ICD-10-CM | POA: Diagnosis not present

## 2015-08-31 DIAGNOSIS — Z951 Presence of aortocoronary bypass graft: Secondary | ICD-10-CM

## 2015-09-02 ENCOUNTER — Encounter (HOSPITAL_COMMUNITY)
Admission: RE | Admit: 2015-09-02 | Discharge: 2015-09-02 | Disposition: A | Discharge status: Home / Self Care | Payer: 59 | Source: Ambulatory Visit | Attending: Cardiology | Admitting: Cardiology

## 2015-09-02 DIAGNOSIS — Z955 Presence of coronary angioplasty implant and graft: Secondary | ICD-10-CM | POA: Insufficient documentation

## 2015-09-02 DIAGNOSIS — Z951 Presence of aortocoronary bypass graft: Secondary | ICD-10-CM

## 2015-09-02 DIAGNOSIS — I213 ST elevation (STEMI) myocardial infarction of unspecified site: Secondary | ICD-10-CM | POA: Insufficient documentation

## 2015-09-03 ENCOUNTER — Other Ambulatory Visit: Payer: Self-pay | Admitting: Cardiology

## 2015-09-06 NOTE — Telephone Encounter (Signed)
Rx(s) sent to pharmacy electronically.  

## 2015-09-07 ENCOUNTER — Encounter (HOSPITAL_COMMUNITY)
Admission: RE | Admit: 2015-09-07 | Discharge: 2015-09-07 | Disposition: A | Discharge status: Home / Self Care | Payer: 59 | Source: Ambulatory Visit | Attending: Cardiology | Admitting: Cardiology

## 2015-09-07 DIAGNOSIS — I213 ST elevation (STEMI) myocardial infarction of unspecified site: Secondary | ICD-10-CM | POA: Diagnosis not present

## 2015-09-07 DIAGNOSIS — Z951 Presence of aortocoronary bypass graft: Secondary | ICD-10-CM

## 2015-09-09 ENCOUNTER — Encounter (HOSPITAL_COMMUNITY): Payer: 59

## 2015-09-12 ENCOUNTER — Encounter (HOSPITAL_COMMUNITY)
Admission: RE | Admit: 2015-09-12 | Discharge: 2015-09-12 | Disposition: A | Discharge status: Home / Self Care | Payer: 59 | Source: Ambulatory Visit | Attending: Cardiology | Admitting: Cardiology

## 2015-09-12 DIAGNOSIS — Z951 Presence of aortocoronary bypass graft: Secondary | ICD-10-CM

## 2015-09-12 DIAGNOSIS — I213 ST elevation (STEMI) myocardial infarction of unspecified site: Secondary | ICD-10-CM | POA: Diagnosis not present

## 2015-09-14 ENCOUNTER — Encounter (HOSPITAL_COMMUNITY)
Admission: RE | Admit: 2015-09-14 | Discharge: 2015-09-14 | Disposition: A | Discharge status: Home / Self Care | Payer: 59 | Source: Ambulatory Visit | Attending: Cardiology | Admitting: Cardiology

## 2015-09-14 DIAGNOSIS — Z951 Presence of aortocoronary bypass graft: Secondary | ICD-10-CM

## 2015-09-14 DIAGNOSIS — I213 ST elevation (STEMI) myocardial infarction of unspecified site: Secondary | ICD-10-CM | POA: Diagnosis not present

## 2015-09-15 NOTE — Progress Notes (Signed)
Cardiac Individual Treatment Plan  Patient Details  Name: Ryan Ward MRN: [id number] Date of Birth: 1945-11-22 Referring Provider:   Flowsheet Row CARDIAC REHAB PHASE II ORIENTATION from 06/14/2015 in [place]  Referring Provider  Martinique, Peter MD      Initial Encounter Date:  [place] PHASE II ORIENTATION from 06/14/2015 in [place]  Date  06/14/15  Referring Provider  Martinique, Peter MD      Visit Diagnosis: S/P CABG (coronary artery bypass graft)  Patient's Home Medications on Admission:  Current Outpatient Prescriptions:  .  aspirin EC 81 MG tablet, Take 81 mg by mouth daily., Disp: , Rfl:  .  atorvastatin (LIPITOR) 80 MG tablet, TAKE 1 TABLET (80 MG TOTAL) BY MOUTH DAILY AT 6 PM., Disp: 90 tablet, Rfl: 3 .  Cholecalciferol (VITAMIN D3) 5000 UNITS TABS, Take 5,000 Units by mouth daily. , Disp: , Rfl:  .  clopidogrel (PLAVIX) 75 MG tablet, Take 1 tablet (75 mg total) by mouth daily., Disp: 30 tablet, Rfl: 1 .  famotidine (PEPCID) 20 MG tablet, Take 20 mg by mouth 2 (two) times daily. Pt reports he takes prn depending on dietary intake, Disp: , Rfl:  .  FEROCON capsule, TAKE 1 CAPSULE BY MOUTH 3 TIMES DAILY AFTER MEALS, Disp: 90 capsule, Rfl: 0 .  furosemide (LASIX) 40 MG tablet, Take 2 tablets in the AM 1 tablet in the PM, Disp: 270 tablet, Rfl: 3 .  furosemide (LASIX) 40 MG tablet, TAKE 1 TABLET BY MOUTH TWICE A DAY, Disp: 60 tablet, Rfl: 6 .  levothyroxine (SYNTHROID, LEVOTHROID) 50 MCG tablet, Take 1 tablet (50 mcg total) by mouth 2 (two) times daily., Disp: 180 tablet, Rfl: 3 .  lisinopril (PRINIVIL,ZESTRIL) 2.5 MG tablet, Take 1 tablet (2.5 mg total) by mouth daily., Disp: 30 tablet, Rfl: 11 .  Multiple Vitamin (MULTIVITAMIN WITH MINERALS) TABS tablet, Take 1 tablet by mouth daily., Disp: , Rfl:  .  Potassium Chloride ER 20 MEQ TBCR, Take 40 mEq by mouth daily., Disp: 90 tablet, Rfl: 1 .   PROAIR HFA 108 (90 Base) MCG/ACT inhaler, , Disp: , Rfl:   Past Medical History: Past Medical History:  Diagnosis Date  . Asthma    VERY MILD  . CAD (coronary artery disease)    a. 12/07/14 Inf STEMI/PCI: LAD 85p/m, 48m/d, LCX 80ost, RCA 30p/m, 41m/d, 100d (4.0x20 Promus Premier DES). Hosp course complicated by VF arrest, CGS, CHB req temp wire; b. 03/2015 Cath: LAD 85p/m, 95d, LCx 80ost, RCA 30-40p/m/d, RPDA 75, EF 30-35%, Nl CO; c. 04/06/2015 CABGx4 (LIMA->LAD, VG->Diag, Seq VG->prox RPDA->mid RPDA.  [name] Chronic systolic CHF (congestive heart failure) ([place])    a. 03/2015 Echo: EF 30-35%, diff HK.  [name] Hyperlipidemia   . Hypertensive heart disease   . Ischemic cardiomyopathy    a. 03/2015 Echo: EF 30-35%, diff HK, mild MR, mod dil RA, PASP 62mmHg.  . OSA (obstructive sleep apnea)    a. not using CPAP at home, does have machine  . Paroxysmal atrial fibrillation ([place])    a. 12/2014 In setting of Inf MI->convertred spont.  . Pulmonary embolism ([place])    a. 12/2014 R PT and peroneal vein DVT and RUL PE-->Xarelto x 3 mos.    Tobacco Use: History  Smoking Status  . Never Smoker  Smokeless Tobacco  . Never Used    Labs: Recent Review Flowsheet Data    Labs for ITP Cardiac and Pulmonary Rehab  Latest Ref Rng & Units 04/09/2015 04/09/2015 04/10/2015 04/11/2015 06/01/2015   Cholestrol 125 - 200 mg/dL - - - - 92(L)   LDLCALC <130 mg/dL - - - - 48   HDL >=40 mg/dL - - - - 34(L)   Trlycerides <150 mg/dL - - - - 48   Hemoglobin A1c 4.8 - 5.6 % - - - - -   PHART 7.350 - 7.450 - - - - -   PCO2ART 35.0 - 45.0 mmHg - - - - -   HCO3 20.0 - 24.0 mEq/L - - - - -   TCO2 0 - 100 mmol/L - 29 30 - -   ACIDBASEDEF 0.0 - 2.0 mmol/L - - - - -   O2SAT % 57.1 - 42.8 63.1 -      Capillary Blood Glucose: Lab Results  Component Value Date   GLUCAP 95 04/13/2015   GLUCAP 81 04/13/2015   GLUCAP 104 (H) 04/12/2015   GLUCAP 86 04/12/2015   GLUCAP 89 04/12/2015     Exercise Target Goals:    Exercise Program  Goal: Individual exercise prescription set with THRR, safety & activity barriers. Participant demonstrates ability to understand and report RPE using BORG scale, to self-measure pulse accurately, and to acknowledge the importance of the exercise prescription.  Exercise Prescription Goal: Starting with aerobic activity 30 plus minutes a day, 3 days per week for initial exercise prescription. Provide home exercise prescription and guidelines that participant acknowledges understanding prior to discharge.  Activity Barriers & Risk Stratification:     Activity Barriers & Cardiac Risk Stratification - 06/14/15 1712      Activity Barriers & Cardiac Risk Stratification   Activity Barriers None   Cardiac Risk Stratification High      6 Minute Walk:     6 Minute Walk    Row Name 06/14/15 1713         6 Minute Walk   Phase Initial     Distance 1639 feet     Walk Time 6 minutes     # of Rest Breaks 0     MPH 3.1     METS 3.3     RPE 13     VO2 Peak 11.56     Symptoms No     Resting HR 64 bpm     Resting BP 104/60     Max Ex. HR 89 bpm     Max Ex. BP 116/68     2 Minute Post BP 110/60        Initial Exercise Prescription:     Initial Exercise Prescription - 06/15/15 0800      Date of Initial Exercise RX and Referring Provider   Date 06/14/15   Referring Provider Martinique, Peter MD     Treadmill   MPH 2.5   Grade 1   Minutes 10   METs 3.26     Bike   Level 1   Minutes 10   METs 3.3     NuStep   Level 3   Minutes 10   METs 2.5     Prescription Details   Frequency (times per week) 3   Duration Progress to 30 minutes of continuous aerobic without signs/symptoms of physical distress     Intensity   THRR 40-80% of Max Heartrate 60-121   Ratings of Perceived Exertion 11-13     Progression   Progression Continue to progress workloads to maintain intensity without signs/symptoms of physical distress.  Resistance Training   Training Prescription Yes    Weight 2 lb   Reps 10-12      Perform Capillary Blood Glucose checks as needed.  Exercise Prescription Changes:      Exercise Prescription Changes    Row Name 07/01/15 1500 07/11/15 1200 07/20/15 1600 07/27/15 1100 08/11/15 1600     Exercise Review   Progression Yes Yes Yes Yes Yes     Response to Exercise   Blood Pressure (Admit) 104/62 104/58 100/60 100/60 100/58   Blood Pressure (Exercise) 122/70 98/58 102/60 102/60 124/78   Blood Pressure (Exit) 94/60 86/52 100/59 100/59 100/60   Heart Rate (Admit) 74 bpm 73 bpm 80 bpm 80 bpm 79 bpm   Heart Rate (Exercise) 99 bpm 100 bpm 108 bpm 108 bpm 118 bpm   Heart Rate (Exit) 74 bpm 73 bpm 71 bpm 71 bpm 72 bpm   Rating of Perceived Exertion (Exercise) 11 11 11 11 11    Comments  - Reviewed HEP on 06/29/15 Reviewed HEP on 06/29/15 Reviewed HEP on 06/29/15 Reviewed HEP on 06/29/15   Duration Progress to 30 minutes of continuous aerobic without signs/symptoms of physical distress Progress to 30 minutes of continuous aerobic without signs/symptoms of physical distress Progress to 30 minutes of continuous aerobic without signs/symptoms of physical distress Progress to 30 minutes of continuous aerobic without signs/symptoms of physical distress Progress to 30 minutes of continuous aerobic without signs/symptoms of physical distress   Intensity THRR unchanged THRR unchanged THRR unchanged THRR unchanged THRR unchanged     Progression   Progression Continue to progress workloads to maintain intensity without signs/symptoms of physical distress. Continue to progress workloads to maintain intensity without signs/symptoms of physical distress. Continue to progress workloads to maintain intensity without signs/symptoms of physical distress. Continue to progress workloads to maintain intensity without signs/symptoms of physical distress. Continue to progress workloads to maintain intensity without signs/symptoms of physical distress.   Average METs 3 3 3.1 3.1  3.4     Resistance Training   Training Prescription Yes Yes Yes Yes Yes   Weight 2lbs 4lbs 4lbs 4lbs 4lbs   Reps 10-12 10-12 10-12 10-12 10-12     Treadmill   MPH 2.5 2.5 2.5 2.5 2.8   Grade 1 1 1 1 1    Minutes 10 10 10 10 10    METs 3.26 3.26 3.26 3.26 3.53     Bike   Level 1 1 1.2 1.2 1.5   Minutes 10 10 10 10 10    METs 3.3 3.3 3.23 3.23 4.35     NuStep   Level 3 3 3 3 4    Minutes 10 10 10 10 10    METs 2.7 2.8 3 3  3.3     Home Exercise Plan   Plans to continue exercise at  - Home  Plans to walk and use elliptical. See progress note Home  Plans to walk and use elliptical. See progress note Home  Plans to walk and use elliptical. See progress note Home  Plans to walk and use elliptical. See progress note   Frequency  - Add 2 additional days to program exercise sessions.  2-3x/week Add 2 additional days to program exercise sessions.  2-3x/week Add 2 additional days to program exercise sessions.  2-3x/week Add 2 additional days to program exercise sessions.  2-3x/week   Row Name 08/24/15 0800 09/09/15 1600           Exercise Review   Progression No Yes  Response to Exercise   Blood Pressure (Admit) 100/56 96/62      Blood Pressure (Exercise) 98/60 120/60      Blood Pressure (Exit) 93/63  given gatorade 104/60  given gatorade      Heart Rate (Admit) 66 bpm 75 bpm      Heart Rate (Exercise) 107 bpm 106 bpm      Heart Rate (Exit) 75 bpm 69 bpm      Rating of Perceived Exertion (Exercise) 12 11      Comments Reviewed HEP on 06/29/15 Reviewed HEP on 06/29/15      Duration Progress to 30 minutes of continuous aerobic without signs/symptoms of physical distress Progress to 30 minutes of continuous aerobic without signs/symptoms of physical distress      Intensity THRR unchanged THRR unchanged        Progression   Progression Continue to progress workloads to maintain intensity without signs/symptoms of physical distress. Continue to progress workloads to maintain  intensity without signs/symptoms of physical distress.      Average METs 3.1 3.3        Resistance Training   Training Prescription Yes Yes      Weight 4lbs 4lbs      Reps 10-12 10-12        Treadmill   MPH 2.8 2.8      Grade 2 2      Minutes 10 10      METs 3.91 3.91        Bike   Level 1.5 1.5      Minutes 10 10      METs 4.3 4.3        NuStep   Level 4 4      Minutes 10 10      METs 2.8 2.8        Home Exercise Plan   Plans to continue exercise at Home  Plans to walk and use elliptical. See progress note Home  Plans to walk and use elliptical. See progress note      Frequency Add 2 additional days to program exercise sessions.  2-3x/week Add 2 additional days to program exercise sessions.  2-3x/week         Exercise Comments:      Exercise Comments    Row Name 07/11/15 1215 08/11/15 1625 08/24/15 0836 09/09/15 1647     Exercise Comments Reviewed METs and goals. Pt is tolerating exercise well at [facility], will continue to monitor exercise progression. Reviewed METs and goals. Pt is tolerating exercise well at [facility], will continue to monitor exercise progression. Reviewed METs. Pt is tolerating exercise well at [facility], will continue to monitor exercise progression. Reviewed METs and goals. Pt is tolerating exercise well at [facility] will continue to monitor exercise progression.       Discharge Exercise Prescription (Final Exercise Prescription Changes):     Exercise Prescription Changes - 09/09/15 1600      Exercise Review   Progression Yes     Response to Exercise   Blood Pressure (Admit) 96/62   Blood Pressure (Exercise) 120/60   Blood Pressure (Exit) 104/60  given gatorade   Heart Rate (Admit) 75 bpm   Heart Rate (Exercise) 106 bpm   Heart Rate (Exit) 69 bpm   Rating of Perceived Exertion (Exercise) 11   Comments Reviewed HEP on 06/29/15   Duration Progress to 30 minutes of continuous aerobic without signs/symptoms of physical distress   Intensity THRR  unchanged     Progression  Progression Continue to progress workloads to maintain intensity without signs/symptoms of physical distress.   Average METs 3.3     Resistance Training   Training Prescription Yes   Weight 4lbs   Reps 10-12     Treadmill   MPH 2.8   Grade 2   Minutes 10   METs 3.91     Bike   Level 1.5   Minutes 10   METs 4.3     NuStep   Level 4   Minutes 10   METs 2.8     Home Exercise Plan   Plans to continue exercise at Home  Plans to walk and use elliptical. See progress note   Frequency Add 2 additional days to program exercise sessions.  2-3x/week      Nutrition:  Target Goals: Understanding of nutrition guidelines, daily intake of sodium 1500mg , cholesterol 200mg , calories 30% from fat and 7% or less from saturated fats, daily to have 5 or more servings of fruits and vegetables.  Biometrics:     Pre Biometrics - 06/14/15 1711      Pre Biometrics   Height 5\' 7"  (1.702 m)   Weight 179 lb 10.8 oz (81.5 kg)   Waist Circumference 36.75 inches   Hip Circumference 40 inches   Waist to Hip Ratio 0.92 %   BMI (Calculated) 28.2   Triceps Skinfold 13 mm   % Body Fat 25.7 %   Grip Strength 48.5 kg   Flexibility 9 in   Single Leg Stand 30 seconds       Nutrition Therapy Plan and Nutrition Goals:     Nutrition Therapy & Goals - 06/20/15 1022      Nutrition Therapy   Diet Therapeutic Lifestyle Changes     Personal Nutrition Goals   Personal Goal #1 Maintain wt around 182 lb while in [place], educate and counsel regarding individualized specific dietary modifications aiming towards targeted core components such as weight, hypertension, lipid management, diabetes, heart failure and other comorbidities.   Expected Outcomes Short Term Goal: Understand basic principles of dietary content, such as calories, fat, sodium, cholesterol and nutrients.;Long Term Goal: Adherence to prescribed  nutrition plan.      Nutrition Discharge: Nutrition Scores:     Nutrition Assessments - 06/20/15 1022      MEDFICTS Scores   Pre Score 18      Nutrition Goals Re-Evaluation:     Nutrition Goals Re-Evaluation    Row Name 07/29/15 0835             Personal Goal #1 Re-Evaluation   Personal Goal #1 Maintain wt around 182 lb while in Cardiac Rehab         Intervention Plan   Intervention Continue to educate, counsel and set short/long term goals regarding individualized specific personal dietary modifications.;Nutrition handout(s) given to patient.  Pre-diabetes handout given          Psychosocial: Target Goals: Acknowledge presence or absence of depression, maximize coping skills, provide positive support system. Participant is able to verbalize types and ability to use techniques and skills needed for reducing stress and depression.  Initial Review & Psychosocial Screening:     Initial Psych Review & Screening - 08/22/15 1543      Family Dynamics   Comments Pyschosocial Assessment reveals no needs at this time, no further intervention warranted      Quality of Life Scores:     Quality of  Life - 06/14/15 1713      Quality of Life Scores   Health/Function Pre 24.7 %   Socioeconomic Pre 28.07 %   Psych/Spiritual Pre 25 %   Family Pre 28.8 %   GLOBAL Pre 26.06 %      PHQ-9: Recent Review Flowsheet Data    Depression screen [facility] 2/9 09/16/2015 06/20/2015 02/02/2015   Decreased Interest 0 0 0   Down, Depressed, Hopeless 0 0 0   PHQ - 2 Score 0 0 0      Psychosocial Evaluation and Intervention:     Psychosocial Evaluation - 09/15/15 1505      Psychosocial Evaluation & Interventions   Interventions Encouraged to exercise with the program and follow exercise prescription      Psychosocial Re-Evaluation:     Psychosocial Re-Evaluation    [place] Name 09/15/15 1505             Psychosocial Re-Evaluation   Interventions Encouraged to attend Cardiac  Rehabilitation for the exercise       Comments Pt with supportive family, no needs identified, No further intervention warranted at this time          Vocational Rehabilitation: Provide vocational rehab assistance to qualifying candidates.   Vocational Rehab Evaluation & Intervention:   Education: Education Goals: Education classes will be provided on a weekly basis, covering required topics. Participant will state understanding/return demonstration of topics presented.  Learning Barriers/Preferences:     Learning Barriers/Preferences - 06/14/15 1634      Learning Barriers/Preferences   Learning Barriers None;Reading   Learning Preferences Skilled Demonstration      Education Topics: Count Your Pulse:  -Group instruction provided by verbal instruction, demonstration, patient participation and written materials to support subject.  Instructors address importance of being able to find your pulse and how to count your pulse when at home without a heart monitor.  Patients get hands on experience counting their pulse with staff help and individually. Flowsheet Row CARDIAC REHAB PHASE II EXERCISE from 09/14/2015 in [place]  Date  09/02/15  Educator  Maurice Small, RN  Instruction Review Code  R- Review/reinforce      Heart Attack, Angina, and Risk Factor Modification:  -Group instruction provided by verbal instruction, video, and written materials to support subject.  Instructors address signs and symptoms of angina and heart attacks.    Also discuss risk factors for heart disease and how to make changes to improve heart health risk factors. Flowsheet Row CARDIAC REHAB PHASE II EXERCISE from 09/14/2015 in [place]  Date  08/24/15  Instruction Review Code  2- meets goals/outcomes      Functional Fitness:  -Group instruction provided by verbal instruction, demonstration, patient participation, and written  materials to support subject.  Instructors address safety measures for doing things around the house.  Discuss how to get up and down off the floor, how to pick things up properly, how to safely get out of a chair without assistance, and balance training.   Meditation and Mindfulness:  -Group instruction provided by verbal instruction, patient participation, and written materials to support subject.  Instructor addresses importance of mindfulness and meditation practice to help reduce stress and improve awareness.  Instructor also leads participants through a meditation exercise.  Flowsheet Row CARDIAC REHAB PHASE II EXERCISE from 09/14/2015 in [place]  Date  08/31/15  Educator  Derek Mound, RD  Instruction Review Code  2-  meets goals/outcomes      Stretching for Flexibility and Mobility:  -Group instruction provided by verbal instruction, patient participation, and written materials to support subject.  Instructors lead participants through series of stretches that are designed to increase flexibility thus improving mobility.  These stretches are additional exercise for major muscle groups that are typically performed during regular warm up and cool down. Flowsheet Row CARDIAC REHAB PHASE II EXERCISE from 09/14/2015 in [place]  Date  08/26/15  Instruction Review Code  2- meets goals/outcomes      Hands Only CPR Anytime:  -Group instruction provided by verbal instruction, video, patient participation and written materials to support subject.  Instructors co-teach with [facility] video for hands only CPR.  Participants get hands on experience with mannequins.   Nutrition I class: Heart Healthy Eating:  -Group instruction provided by PowerPoint slides, verbal discussion, and written materials to support subject matter. The instructor gives an explanation and review of the Therapeutic Lifestyle Changes diet recommendations, which  includes a discussion on lipid goals, dietary fat, sodium, fiber, plant stanol/sterol esters, sugar, and the components of a well-balanced, healthy diet.   Nutrition II class: Lifestyle Skills:  -Group instruction provided by PowerPoint slides, verbal discussion, and written materials to support subject matter. The instructor gives an explanation and review of label reading, grocery shopping for heart health, heart healthy recipe modifications, and ways to make healthier choices when eating out. Flowsheet Row CARDIAC REHAB PHASE II EXERCISE from 09/14/2015 in [place]  Date  07/29/15  Educator  RD  Instruction Review Code  Not applicable [class handouts given]      Diabetes Question & Answer:  -Group instruction provided by PowerPoint slides, verbal discussion, and written materials to support subject matter. The instructor gives an explanation and review of diabetes co-morbidities, pre- and post-prandial blood glucose goals, pre-exercise blood glucose goals, signs, symptoms, and treatment of hypoglycemia and hyperglycemia, and foot care basics. Flowsheet Row CARDIAC REHAB PHASE II EXERCISE from 09/14/2015 in [place]  Date  08/12/15  Educator  RD  Instruction Review Code  2- meets goals/outcomes      Diabetes Blitz:  -Group instruction provided by PowerPoint slides, verbal discussion, and written materials to support subject matter. The instructor gives an explanation and review of the physiology behind type 1 and type 2 diabetes, diabetes medications and rational behind using different medications, pre- and post-prandial blood glucose recommendations and Hemoglobin A1c goals, diabetes diet, and exercise including blood glucose guidelines for exercising safely.    Portion Distortion:  -Group instruction provided by PowerPoint slides, verbal discussion, written materials, and food models to support subject matter. The  instructor gives an explanation of serving size versus portion size, changes in portions sizes over the last 20 years, and what consists of a serving from each food group. Flowsheet Row CARDIAC REHAB PHASE II EXERCISE from 09/14/2015 in [place]  Date  08/10/15  Educator  RD  Instruction Review Code  2- meets goals/outcomes      Stress Management:  -Group instruction provided by verbal instruction, video, and written materials to support subject matter.  Instructors review role of stress in heart disease and how to cope with stress positively.   Flowsheet Row CARDIAC REHAB PHASE II EXERCISE from 09/14/2015 in [place]  Date  08/03/15  Instruction Review Code  2- meets goals/outcomes  Exercising on Your Own:  -Group instruction provided by verbal instruction, power point, and written materials to support subject.  Instructors discuss benefits of exercise, components of exercise, frequency and intensity of exercise, and end points for exercise.  Also discuss use of nitroglycerin and activating EMS.  Review options of places to exercise outside of rehab.  Review guidelines for sex with heart disease. Flowsheet Row CARDIAC REHAB PHASE II EXERCISE from 09/14/2015 in [place]  Date  07/27/15  Instruction Review Code  2- meets goals/outcomes      Cardiac Drugs I:  -Group instruction provided by verbal instruction and written materials to support subject.  Instructor reviews cardiac drug classes: antiplatelets, anticoagulants, beta blockers, and statins.  Instructor discusses reasons, side effects, and lifestyle considerations for each drug class.   Cardiac Drugs II:  -Group instruction provided by verbal instruction and written materials to support subject.  Instructor reviews cardiac drug classes: angiotensin converting enzyme inhibitors (ACE-I), angiotensin II receptor blockers (ARBs),  nitrates, and calcium channel blockers.  Instructor discusses reasons, side effects, and lifestyle considerations for each drug class. Flowsheet Row CARDIAC REHAB PHASE II EXERCISE from 09/14/2015 in [place]  Date  09/14/15  Instruction Review Code  2- meets goals/outcomes      Anatomy and Physiology of the Circulatory System:  -Group instruction provided by verbal instruction, video, and written materials to support subject.  Reviews functional anatomy of heart, how it relates to various diagnoses, and what role the heart plays in the overall system.   Knowledge Questionnaire Score:     Knowledge Questionnaire Score - 06/14/15 1712      Knowledge Questionnaire Score   Pre Score 22/24      Core Components/Risk Factors/Patient Goals at Admission:     Personal Goals and Risk Factors at Admission - 06/14/15 1634      Core Components/Risk Factors/Patient Goals on Admission   Sedentary Yes   Intervention Provide advice, education, support and counseling about physical activity/exercise needs.;Develop an individualized exercise prescription for aerobic and resistive training based on initial evaluation findings, risk stratification, comorbidities and participant's personal goals.   Expected Outcomes Achievement of increased cardiorespiratory fitness and enhanced flexibility, muscular endurance and strength shown through measurements of functional capacity and personal statement of participant.   Increase Strength and Stamina Yes   Intervention Provide advice, education, support and counseling about physical activity/exercise needs.;Develop an individualized exercise prescription for aerobic and resistive training based on initial evaluation findings, risk stratification, comorbidities and participant's personal goals.   Expected Outcomes Achievement of increased cardiorespiratory fitness and enhanced flexibility, muscular endurance and strength shown  through measurements of functional capacity and personal statement of participant.      Core Components/Risk Factors/Patient Goals Review:      Goals and Risk Factor Review    Row Name 07/11/15 1212 08/11/15 1625 09/09/15 1647         Core Components/Risk Factors/Patient Goals Review   Personal Goals Review Other Other;Increase Strength and Stamina Increase Strength and Stamina     Review Feels better now than before he had his MI in December. Discussed exercise and education for reduction of risk factors for cardiac event Gaining knowledge on CV health. Great support system at home and in rehab. Stamina and energy is improving as well Pt feels better than before cardiac event/surgery. Pt was able to walk at the [facility] over the weekend without complications     Expected Outcomes Pt  will continue to feel good, exercise and live a healthy lifestyle Pt will continue to improve in stamina and energy, and have increase awareness and knowledge on CV diseases/health Pt will continue to improve cardiovacular fitness        Core Components/Risk Factors/Patient Goals at Discharge (Final Review):      Goals and Risk Factor Review - 09/09/15 1647      Core Components/Risk Factors/Patient Goals Review   Personal Goals Review Increase Strength and Stamina   Review Pt feels better than before cardiac event/surgery. Pt was able to walk at the [facility] over the weekend without complications   Expected Outcomes Pt will continue to improve cardiovacular fitness      ITP Comments:     ITP Comments    Row Name 06/14/15 1546 08/05/15 0813         ITP Comments Dr. Fransico Him, Medical Director  08/05/15 Patient attended Hypertension education class. Meets goals/outcomes.         Comments:  Pt is making expected progress toward personal goals after completing 33 sessions.  Will graduate next week. Recommend continued exercise and life style modification education including  stress  management and relaxation techniques to decrease cardiac risk profile. Pt holds his lasix prior to exercise this helps with his soft BP. Psychosocial Assessment remains unchanged from previous assessment. No needs identified. No further intervention warranted at this time. Cherre Huger, BSN

## 2015-09-16 ENCOUNTER — Encounter (HOSPITAL_COMMUNITY)
Admission: RE | Admit: 2015-09-16 | Discharge: 2015-09-16 | Disposition: A | Discharge status: Home / Self Care | Payer: 59 | Source: Ambulatory Visit | Attending: Cardiology | Admitting: Cardiology

## 2015-09-16 DIAGNOSIS — I213 ST elevation (STEMI) myocardial infarction of unspecified site: Secondary | ICD-10-CM | POA: Diagnosis not present

## 2015-09-16 DIAGNOSIS — Z951 Presence of aortocoronary bypass graft: Secondary | ICD-10-CM

## 2015-09-19 ENCOUNTER — Encounter (HOSPITAL_COMMUNITY)
Admission: RE | Admit: 2015-09-19 | Discharge: 2015-09-19 | Disposition: A | Discharge status: Home / Self Care | Payer: 59 | Source: Ambulatory Visit | Attending: Cardiology | Admitting: Cardiology

## 2015-09-19 DIAGNOSIS — I213 ST elevation (STEMI) myocardial infarction of unspecified site: Secondary | ICD-10-CM | POA: Diagnosis not present

## 2015-09-19 DIAGNOSIS — Z951 Presence of aortocoronary bypass graft: Secondary | ICD-10-CM

## 2015-09-21 ENCOUNTER — Encounter (HOSPITAL_COMMUNITY): Payer: 59

## 2015-09-23 ENCOUNTER — Encounter (HOSPITAL_COMMUNITY): Admission: RE | Admit: 2015-09-23 | Payer: 59 | Source: Ambulatory Visit

## 2015-09-25 ENCOUNTER — Other Ambulatory Visit: Payer: Self-pay | Admitting: Cardiology

## 2015-09-26 ENCOUNTER — Encounter (HOSPITAL_COMMUNITY): Payer: 59

## 2015-09-28 ENCOUNTER — Encounter (HOSPITAL_COMMUNITY): Payer: 59

## 2015-10-09 NOTE — Progress Notes (Signed)
Office Visit    Patient Name: Ryan Ward Date of Encounter: 10/10/2015  Primary Care Provider:  Leonard Downing, MD Primary Cardiologist:  P. Martinique, MD   Chief Complaint    70 year old male status post coronary artery bypass grafting, who presents for follow-up.  Past Medical History    Past Medical History:  Diagnosis Date  . Asthma    VERY MILD  . CAD (coronary artery disease)    a. 12/07/14 Inf STEMI/PCI: LAD 85p/m, 11m/d, LCX 80ost, RCA 30p/m, 43m/d, 100d (4.0x20 Promus Premier DES). Hosp course complicated by VF arrest, CGS, CHB req temp wire; b. 03/2015 Cath: LAD 85p/m, 95d, LCx 80ost, RCA 30-40p/m/d, RPDA 75, EF 30-35%, Nl CO; c. 04/06/2015 CABGx4 (LIMA->LAD, VG->Diag, Seq VG->prox RPDA->mid RPDA.  [name] Chronic systolic CHF (congestive heart failure) ([place])    a. 03/2015 Echo: EF 30-35%, diff HK.  [name] Hyperlipidemia   . Hypertensive heart disease   . Ischemic cardiomyopathy    a. 03/2015 Echo: EF 30-35%, diff HK, mild MR, mod dil RA, PASP 60mmHg.  . OSA (obstructive sleep apnea)    a. not using CPAP at home, does have machine  . Paroxysmal atrial fibrillation ([place])    a. 12/2014 In setting of Inf MI->convertred spont.  . Pulmonary embolism ([place])    a. 12/2014 R PT and peroneal vein DVT and RUL PE-->Xarelto x 3 mos.   Past Surgical History:  Procedure Laterality Date  . CARDIAC CATHETERIZATION N/A 12/07/2014   Procedure: Coronary Stent Intervention;  Surgeon: Peter M Martinique, MD;  Location: [place] [facility];  Service: Cardiovascular;  Laterality: N/A;  STEMI  . CARDIAC CATHETERIZATION N/A 12/07/2014   Procedure: IABP Insertion;  Surgeon: Peter M Martinique, MD;  Location: [place] [facility];  Service: Cardiovascular;  Laterality: N/A;  . CARDIAC CATHETERIZATION N/A 12/07/2014   Procedure: Temporary Pacemaker;  Surgeon: Peter M Martinique, MD;  Location: [place] [facility];  Service: Cardiovascular;  Laterality: N/A;  . CARDIAC CATHETERIZATION N/A 12/07/2014   Procedure: Left  Heart Cath and Coronary Angiography;  Surgeon: Peter M Martinique, MD;  Location: [place] [facility];  Service: Cardiovascular;  Laterality: N/A;  . CARDIAC CATHETERIZATION N/A 03/29/2015   Procedure: Right/Left Heart Cath and Coronary Angiography;  Surgeon: Peter M Martinique, MD;  Location: [place] [facility];  Service: Cardiovascular;  Laterality: N/A;  . CORONARY ARTERY BYPASS GRAFT N/A 04/06/2015   Procedure: CORONARY ARTERY BYPASS GRAFT TIMES FOUR  USING LEFT INTERNAL MAMMARY ARTERY TO THE LEFT ANTERIOR DESCENDING, BILATERAL GREATER SAPHENOUS ENDOVEIN HARVEST GRAFT TO PROXIMAL AND MID POSTERIOR DESCENDING AND DIAGONAL CORONARY ARTERIES AND PLACEMENT OR RIGHT FEMORAL A-LINE. ;  Surgeon: [name], MD;  Location: [place];  Service: Open Heart Surgery;  Laterality: N/A;  . HERNIA REPAIR    . TEE WITHOUT CARDIOVERSION N/A 04/06/2015   Procedure: TRANSESOPHAGEAL ECHOCARDIOGRAM (TEE);  Surgeon: [name], MD;  Location: [place];  Service: Open Heart Surgery;  Laterality: N/A;  . TONSILLECTOMY      Allergies  No Known Allergies  History of Present Illness    70 year old male with the above complex past medical history. He suffered an inferior ST segment elevation myocardial infarction in December 2016 with catheterization revealing an occluded RCA and also severe LAD and circumflex disease. The RCA was successfully treated with a drug-eluting stent. Periprocedural course was complicated by cardiogenic shock, ventricular fibrillation, and complete heart block. He also developed atrial fibrillation. During the same admission, he developed a right  lower extremity DVT and right upper lobe pulmonary embolus. He was then placed on Xarelto and completed a course of anticoagulation.  Ultimately, it was felt that he would require more complete revascularization given residual LAD disease. The circumflex was not felt to be amenable to PCI. He underwent relook catheterization in March which showed patency of the  RCA stent with residual distal RPDA disease as well as proximal and mid LAD disease, and ostial circumflex disease. Patient  underwent successful coronary artery bypass grafting 4. Postoperative course was complicated by atrial fibrillation which was treated successfully with amiodarone therapy. Post DC he developed CHF and was treated with increased dose of lasix. On follow up he was noted to be markedly bradycardic and metoprolol and amiodarone were discontinued. Follow up Echo in July showed some improvement in EF to 35-40%. ICD deferred. Event monitor did not show Afib.  On follow up today he feels much better. He is completing Cardiac Rehab this week. His weight is back up 11 lbs. He  denies dyspnea or chest pain. Energy level is improved.  No edema. BP is low post exercise and he does note some lightheadedness when he stands quickly.   Home Medications     Medication List       Accurate as of 10/10/15  2:10 PM. Always use your most recent med list.          aspirin EC 81 MG tablet Take 81 mg by mouth daily.   atorvastatin 80 MG tablet Commonly known as:  LIPITOR TAKE 1 TABLET (80 MG TOTAL) BY MOUTH DAILY AT 6 PM.   clopidogrel 75 MG tablet Commonly known as:  PLAVIX Take 1 tablet (75 mg total) by mouth daily.   famotidine 20 MG tablet Commonly known as:  PEPCID Take 20 mg by mouth 2 (two) times daily. Pt reports he takes prn depending on dietary intake   FEROCON capsule Generic drug:  ferrous [id number] C-folic acid TAKE 1 CAPSULE BY MOUTH 3 TIMES DAILY AFTER MEALS   furosemide 40 MG tablet Commonly known as:  LASIX Take 2 tablets daily   KLOR-CON M20 20 MEQ tablet Generic drug:  potassium chloride SA TAKE 2 TABLETS BY MOUTH EVERY DAY   levothyroxine 50 MCG tablet Commonly known as:  SYNTHROID, LEVOTHROID Take 1 tablet (50 mcg total) by mouth 2 (two) times daily.   lisinopril 2.5 MG tablet Commonly known as:  PRINIVIL,ZESTRIL Take 1 tablet (2.5 mg total)  by mouth daily.   multivitamin with minerals Tabs tablet Take 1 tablet by mouth daily.   PROAIR HFA 108 (90 Base) MCG/ACT inhaler Generic drug:  albuterol   Vitamin D3 5000 units Tabs Take 5,000 Units by mouth daily.         Review of Systems    As noted in HPI. All other systems reviewed and are otherwise negative except as noted above.  Physical Exam    VS:  BP 107/70   Pulse 95   Ht 5\' 8"  (1.727 m)   Wt 193 lb 9.6 oz (87.8 kg)   SpO2 97%   BMI 29.44 kg/m  , BMI Body mass index is 29.44 kg/m. GEN: Well nourished, well developed, in no acute distress.  HEENT: normal.  Neck: Supple, no JVD, carotid bruits, or masses. Cardiac: RRR, no murmurs, rubs, or gallops. No clubbing, cyanosis, no edema. Radials/DP/PT 2+ and equal bilaterally.  Respiratory:  Respirations regular and unlabored, clear to auscultation bilaterally. GI: Soft, nontender, nondistended, BS + x 4. MS:  no deformity or atrophy. Skin: warm and dry, no rash. Neuro:  Strength and sensation are intact. Psych: Normal affect.  Accessory Clinical Findings       Lab Results  Component Value Date   WBC 11.2 (H) 04/12/2015   HGB 9.0 (L) 04/12/2015   HCT 27.1 (L) 04/12/2015   PLT 276 04/12/2015   GLUCOSE 92 06/22/2015   CHOL 92 (L) 06/01/2015   TRIG 48 06/01/2015   HDL 34 (L) 06/01/2015   LDLCALC 48 06/01/2015   ALT 25 06/01/2015   AST 23 06/01/2015   NA 141 06/22/2015   K 4.1 06/22/2015   CL 101 06/22/2015   CREATININE 1.27 (H) 06/22/2015   BUN 19 06/22/2015   CO2 30 06/22/2015   TSH 5.89 (H) 06/01/2015   INR 1.64 (H) 04/06/2015   HGBA1C 5.9 (H) 12/07/2014   Echo: 07/18/15: Study Conclusions  - Left ventricle: The cavity size was mildly dilated. Systolic   function was moderately reduced. The estimated ejection fraction   was in the range of 35% to 40%. Diffuse hypokinesis. There is   akinesis of the entireinferolateral and inferior myocardium.   Features are consistent with a pseudonormal  left ventricular   filling pattern, with concomitant abnormal relaxation and   increased filling pressure (grade 2 diastolic dysfunction).   Doppler parameters are consistent with high ventricular filling   pressure. - Aortic valve: Moderate diffuse thickening and calcification.   There was trivial regurgitation. - Mitral valve: There was mild regurgitation. - Left atrium: The atrium was mildly dilated. - Right atrium: The atrium was mildly dilated. - Tricuspid valve: There was trivial regurgitation. - Pulmonary arteries: PA peak pressure: 31 mm Hg (S).  Assessment & Plan    1.  Coronary artery disease: Status post coronary artery bypass grafting 4 . Prior inferior STEMi treated with DES.  Continue aspirin, statin, Plavix, and beta blocker therapy.  2. Ischemic cardiopathy/chronic systolic congestive heart failure: EF 30-35% by echo and cath in March 2017. Repeat EF by Echo in July 35-40%. He  has improved with titration of his Lasix. LE edema resolved. Recommend he continue current lasix in am but will change pm dose to prn.  Beta blocker discontinued due to marked bradycardia. Will start lisinopril 2.5 mg daily. Will check chemistries today.    3. Postoperative atrial fibrillation/prolonged QT: He is maintaining NSR off amiodarone. QTc is now normal. Will monitor.   4. Marked sinus bradycardia- improved. Resolved off metoprolol and amiodarone.  5. Hyperlipidemia: Continue high potency statin therapy.  6.  H/o DVT/PE:  He has completed a 3 month course of xarelto.  7. Dispo:   f/u with me in 6 months.  Will check CBC today. If Hgb normal can stop Fe supplement.   Peter Martinique, MD,FACC  10/10/2015, 2:10 PM

## 2015-10-10 ENCOUNTER — Encounter: Payer: Self-pay | Admitting: Cardiology

## 2015-10-10 ENCOUNTER — Ambulatory Visit (INDEPENDENT_AMBULATORY_CARE_PROVIDER_SITE_OTHER): Payer: 59 | Admitting: Cardiology

## 2015-10-10 VITALS — BP 107/70 | HR 95 | Ht 68.0 in | Wt 193.6 lb

## 2015-10-10 DIAGNOSIS — I5042 Chronic combined systolic (congestive) and diastolic (congestive) heart failure: Secondary | ICD-10-CM | POA: Diagnosis not present

## 2015-10-10 DIAGNOSIS — Z9861 Coronary angioplasty status: Secondary | ICD-10-CM

## 2015-10-10 DIAGNOSIS — I2510 Atherosclerotic heart disease of native coronary artery without angina pectoris: Secondary | ICD-10-CM | POA: Diagnosis not present

## 2015-10-10 DIAGNOSIS — E7800 Pure hypercholesterolemia, unspecified: Secondary | ICD-10-CM | POA: Diagnosis not present

## 2015-10-10 DIAGNOSIS — I480 Paroxysmal atrial fibrillation: Secondary | ICD-10-CM

## 2015-10-10 DIAGNOSIS — I10 Essential (primary) hypertension: Secondary | ICD-10-CM

## 2015-10-10 LAB — CBC WITH DIFFERENTIAL/PLATELET
BASOS ABS: 80 {cells}/uL (ref 0–200)
Basophils Relative: 1 %
EOS PCT: 2 %
Eosinophils Absolute: 160 cells/uL (ref 15–500)
HCT: 38.4 % — ABNORMAL LOW (ref 38.5–50.0)
Hemoglobin: 12.8 g/dL — ABNORMAL LOW (ref 13.2–17.1)
LYMPHS ABS: 1440 {cells}/uL (ref 850–3900)
Lymphocytes Relative: 18 %
MCH: 30.3 pg (ref 27.0–33.0)
MCHC: 33.3 g/dL (ref 32.0–36.0)
MCV: 91 fL (ref 80.0–100.0)
MONOS PCT: 9 %
MPV: 9.9 fL (ref 7.5–12.5)
Monocytes Absolute: 720 cells/uL (ref 200–950)
NEUTROS PCT: 70 %
Neutro Abs: 5600 cells/uL (ref 1500–7800)
PLATELETS: 304 10*3/uL (ref 140–400)
RBC: 4.22 MIL/uL (ref 4.20–5.80)
RDW: 14.4 % (ref 11.0–15.0)
WBC: 8 10*3/uL (ref 3.8–10.8)

## 2015-10-10 LAB — LIPID PANEL
CHOLESTEROL: 113 mg/dL — AB (ref 125–200)
HDL: 49 mg/dL (ref >=40)
LDL Cholesterol: 55 mg/dL (ref <130)
TRIGLYCERIDES: 45 mg/dL (ref <150)
Total CHOL/HDL Ratio: 2.3 Ratio (ref <=5.0)
VLDL: 9 mg/dL (ref <30)

## 2015-10-10 LAB — COMPREHENSIVE METABOLIC PANEL
ALK PHOS: 71 U/L (ref 40–115)
ALT: 28 U/L (ref 9–46)
AST: 30 U/L (ref 10–35)
Albumin: 3.9 g/dL (ref 3.6–5.1)
BILIRUBIN TOTAL: 0.8 mg/dL (ref 0.2–1.2)
BUN: 22 mg/dL (ref 7–25)
CALCIUM: 9.3 mg/dL (ref 8.6–10.3)
CO2: 30 mmol/L (ref 20–31)
Chloride: 101 mmol/L (ref 98–110)
Creat: 1.22 mg/dL — ABNORMAL HIGH (ref 0.70–1.18)
GLUCOSE: 123 mg/dL — AB (ref 65–99)
POTASSIUM: 4.1 mmol/L (ref 3.5–5.3)
Sodium: 141 mmol/L (ref 135–146)
TOTAL PROTEIN: 6.4 g/dL (ref 6.1–8.1)

## 2015-10-10 MED ORDER — FUROSEMIDE 40 MG PO TABS: ORAL_TABLET | ORAL | 3 refills | Status: DC

## 2015-10-10 NOTE — Patient Instructions (Signed)
You can stop pm lasix dose and take as needed.  We will check lab work today. If Hgb is normal you can stop iron supplement.  I will see you in 6 months.

## 2015-10-11 ENCOUNTER — Other Ambulatory Visit: Payer: Self-pay

## 2015-10-12 ENCOUNTER — Encounter (HOSPITAL_COMMUNITY)
Admission: RE | Admit: 2015-10-12 | Discharge: 2015-10-12 | Disposition: A | Discharge status: Home / Self Care | Payer: 59 | Source: Ambulatory Visit | Attending: Cardiology | Admitting: Cardiology

## 2015-10-12 DIAGNOSIS — Z951 Presence of aortocoronary bypass graft: Secondary | ICD-10-CM

## 2015-10-12 DIAGNOSIS — I213 ST elevation (STEMI) myocardial infarction of unspecified site: Secondary | ICD-10-CM | POA: Insufficient documentation

## 2015-10-12 DIAGNOSIS — Z955 Presence of coronary angioplasty implant and graft: Secondary | ICD-10-CM | POA: Diagnosis present

## 2015-10-12 NOTE — Progress Notes (Signed)
Discharge Summary  Patient Details  Name: Ryan Ward MRN: [id number] Date of Birth: 1945-05-04 Referring Provider:   Flowsheet Row CARDIAC REHAB PHASE II ORIENTATION from 06/14/2015 in [place]  Referring Provider  Martinique, Peter MD       Number of Visits: 36  Reason for Discharge:  Patient reached a stable level of exercise. Patient independent in their exercise.  Smoking History:  History  Smoking Status  . Never Smoker  Smokeless Tobacco  . Never Used    Diagnosis:  S/P CABG (coronary artery bypass graft)  ADL UCSD:   Initial Exercise Prescription:     Initial Exercise Prescription - 06/15/15 0800      Date of Initial Exercise RX and Referring Provider   Date 06/14/15   Referring Provider Martinique, Peter MD     Treadmill   MPH 2.5   Grade 1   Minutes 10   METs 3.26     Bike   Level 1   Minutes 10   METs 3.3     NuStep   Level 3   Minutes 10   METs 2.5     Prescription Details   Frequency (times per week) 3   Duration Progress to 30 minutes of continuous aerobic without signs/symptoms of physical distress     Intensity   THRR 40-80% of Max Heartrate 60-121   Ratings of Perceived Exertion 11-13     Progression   Progression Continue to progress workloads to maintain intensity without signs/symptoms of physical distress.     Resistance Training   Training Prescription Yes   Weight 2 lb   Reps 10-12      Discharge Exercise Prescription (Final Exercise Prescription Changes):     Exercise Prescription Changes - 10/12/15 1400      Exercise Review   Progression Yes     Response to Exercise   Blood Pressure (Admit) 106/70   Blood Pressure (Exercise) 120/64   Blood Pressure (Exit) 100/60  given gatorade   Heart Rate (Admit) 87 bpm   Heart Rate (Exercise) 117 bpm   Heart Rate (Exit) 87 bpm   Rating of Perceived Exertion (Exercise) 10   Comments Reviewed HEP on 06/29/15   Duration Progress to 30 minutes  of continuous aerobic without signs/symptoms of physical distress   Intensity THRR unchanged     Progression   Progression Continue to progress workloads to maintain intensity without signs/symptoms of physical distress.   Average METs 3.6     Resistance Training   Training Prescription Yes   Weight 4lbs   Reps 10-12     Treadmill   MPH 2.8   Grade 2   Minutes 10   METs 3.91     Bike   Level 1.5   Minutes 10   METs 4.3     NuStep   Level 4   Minutes 10   METs 2.9     Home Exercise Plan   Plans to continue exercise at Home  Plans to walk and use elliptical. See progress note   Frequency Add 2 additional days to program exercise sessions.  2-3x/week      Functional Capacity:     6 Minute Walk    Row Name 06/14/15 1713         6 Minute Walk   Phase Initial     Distance 1639 feet     Walk Time 6 minutes     # of Rest Breaks 0  MPH 3.1     METS 3.3     RPE 13     VO2 Peak 11.56     Symptoms No     Resting HR 64 bpm     Resting BP 104/60     Max Ex. HR 89 bpm     Max Ex. BP 116/68     2 Minute Post BP 110/60        Psychological, QOL, Others - Outcomes: PHQ 2/9: Depression screen [facility] 2/9 10/12/2015 09/16/2015 06/20/2015 02/02/2015  Decreased Interest 0 0 0 0  Down, Depressed, Hopeless 0 0 0 0  PHQ - 2 Score 0 0 0 0    Quality of Life:     Quality of Life - 09/19/15 1100      Quality of Life Scores   Health/Function Post 27.4 %   Socioeconomic Post 28.58 %   Psych/Spiritual Post 26.5 %   Family Post 27.6 %   GLOBAL Post 27.45 %      Personal Goals: Goals established at orientation with interventions provided to work toward goal.     Personal Goals and Risk Factors at Admission - 06/14/15 1634      Core Components/Risk Factors/Patient Goals on Admission   Sedentary Yes   Intervention Provide advice, education, support and counseling about physical activity/exercise needs.;Develop an individualized exercise prescription for aerobic and  resistive training based on initial evaluation findings, risk stratification, comorbidities and participant's personal goals.   Expected Outcomes Achievement of increased cardiorespiratory fitness and enhanced flexibility, muscular endurance and strength shown through measurements of functional capacity and personal statement of participant.   Increase Strength and Stamina Yes   Intervention Provide advice, education, support and counseling about physical activity/exercise needs.;Develop an individualized exercise prescription for aerobic and resistive training based on initial evaluation findings, risk stratification, comorbidities and participant's personal goals.   Expected Outcomes Achievement of increased cardiorespiratory fitness and enhanced flexibility, muscular endurance and strength shown through measurements of functional capacity and personal statement of participant.       Personal Goals Discharge:     Goals and Risk Factor Review    Row Name 07/11/15 1212 08/11/15 1625 09/09/15 1647         Core Components/Risk Factors/Patient Goals Review   Personal Goals Review Other Other;Increase Strength and Stamina Increase Strength and Stamina     Review Feels better now than before he had his MI in December. Discussed exercise and education for reduction of risk factors for cardiac event Gaining knowledge on CV health. Great support system at home and in rehab. Stamina and energy is improving as well Pt feels better than before cardiac event/surgery. Pt was able to walk at the [employer] over the weekend without complications     Expected Outcomes Pt will continue to feel good, exercise and live a healthy lifestyle Pt will continue to improve in stamina and energy, and have increase awareness and knowledge on CV diseases/health Pt will continue to improve cardiovacular fitness        Nutrition & Weight - Outcomes:     Pre Biometrics - 06/14/15 1711      Pre Biometrics   Height 5'  7" (1.702 m)   Weight 179 lb 10.8 oz (81.5 kg)   Waist Circumference 36.75 inches   Hip Circumference 40 inches   Waist to Hip Ratio 0.92 %   BMI (Calculated) 28.2   Triceps Skinfold 13 mm   % Body Fat 25.7 %   Grip  Strength 48.5 kg   Flexibility 9 in   Single Leg Stand 30 seconds       Nutrition:     Nutrition Therapy & Goals - 06/20/15 1022      Nutrition Therapy   Diet Therapeutic Lifestyle Changes     Personal Nutrition Goals   Personal Goal #1 Maintain wt around 182 lb while in [place], educate and counsel regarding individualized specific dietary modifications aiming towards targeted core components such as weight, hypertension, lipid management, diabetes, heart failure and other comorbidities.   Expected Outcomes Short Term Goal: Understand basic principles of dietary content, such as calories, fat, sodium, cholesterol and nutrients.;Long Term Goal: Adherence to prescribed nutrition plan.      Nutrition Discharge:     Nutrition Assessments - 09/23/15 1218      MEDFICTS Scores   Pre Score 18   Post Score 30   Score Difference 12      Education Questionnaire Score:     Knowledge Questionnaire Score - 09/19/15 1100      Knowledge Questionnaire Score   Post Score 22/24      Goals reviewed with patient. Pt graduated from cardiac rehab program today with completion of 36 exercise sessions in Phase II. Pt maintained good attendance to exercise and education classes. Pt  progressed nicely during his participation in rehab as evidenced by increased MET level.   Medication list reconciled. Repeat  PHQ score-0.  Pt demonstrates a positive and healthy outlook on life and has a supportive girlfriend. Pt denies any psychosocial needs. No ongoing psychosocial interventions required.   Pt has made significant lifestyle changes and should be commended for his success. Pt feels he has achieved his goals during cardiac  rehab.   Pt has regained his energy and strength. Pt able to do activities with ease.  Pt feels he has a better understanding of living heart healthier with eating and exercise.   Pt plans to continue exercise with walking every day.  It was a pleasure to work with this pt. Cherre Huger, BSN

## 2015-10-27 ENCOUNTER — Encounter (HOSPITAL_COMMUNITY): Payer: Self-pay | Admitting: *Deleted

## 2015-10-31 ENCOUNTER — Other Ambulatory Visit: Payer: Self-pay | Admitting: Cardiology

## 2015-11-01 NOTE — Telephone Encounter (Signed)
Rx(s) sent to pharmacy electronically.  

## 2015-11-30 ENCOUNTER — Other Ambulatory Visit: Payer: Self-pay | Admitting: Student

## 2016-03-15 NOTE — Progress Notes (Signed)
Office Visit    Patient Name: Ryan Ward Date of Encounter: 03/19/2016  Primary Care Provider:  Leonard Downing, MD Primary Cardiologist:  P. Martinique, MD   Chief Complaint    71 year old male status post coronary artery bypass grafting, who presents for follow-up.  Past Medical History    Past Medical History:  Diagnosis Date  . Asthma    VERY MILD  . CAD (coronary artery disease)    a. 12/07/14 Inf STEMI/PCI: LAD 85p/m, 30m/d, LCX 80ost, RCA 30p/m, 57m/d, 100d (4.0x20 Promus Premier DES). Hosp course complicated by VF arrest, CGS, CHB req temp wire; b. 03/2015 Cath: LAD 85p/m, 95d, LCx 80ost, RCA 30-40p/m/d, RPDA 75, EF 30-35%, Nl CO; c. 04/06/2015 CABGx4 (LIMA->LAD, VG->Diag, Seq VG->prox RPDA->mid RPDA.  [name] Chronic systolic CHF (congestive heart failure) ([place])    a. 03/2015 Echo: EF 30-35%, diff HK.  [name] Hyperlipidemia   . Hypertensive heart disease   . Ischemic cardiomyopathy    a. 03/2015 Echo: EF 30-35%, diff HK, mild MR, mod dil RA, PASP 53mmHg.  . OSA (obstructive sleep apnea)    a. not using CPAP at home, does have machine  . Paroxysmal atrial fibrillation ([place])    a. 12/2014 In setting of Inf MI->convertred spont.  . Pulmonary embolism ([place])    a. 12/2014 R PT and peroneal vein DVT and RUL PE-->Xarelto x 3 mos.   Past Surgical History:  Procedure Laterality Date  . CARDIAC CATHETERIZATION N/A 12/07/2014   Procedure: Coronary Stent Intervention;  Surgeon: Shalona Harbour M Martinique, MD;  Location: [facility];  Service: Cardiovascular;  Laterality: N/A;  STEMI  . CARDIAC CATHETERIZATION N/A 12/07/2014   Procedure: IABP Insertion;  Surgeon: Neco Kling M Martinique, MD;  Location: [facility];  Service: Cardiovascular;  Laterality: N/A;  . CARDIAC CATHETERIZATION N/A 12/07/2014   Procedure: Temporary Pacemaker;  Surgeon: Tylicia Sherman M Martinique, MD;  Location: [facility];  Service: Cardiovascular;  Laterality: N/A;  . CARDIAC CATHETERIZATION N/A 12/07/2014   Procedure: Left  Heart Cath and Coronary Angiography;  Surgeon: Magdalena Skilton M Martinique, MD;  Location: [facility];  Service: Cardiovascular;  Laterality: N/A;  . CARDIAC CATHETERIZATION N/A 03/29/2015   Procedure: Right/Left Heart Cath and Coronary Angiography;  Surgeon: Tyresa Prindiville M Martinique, MD;  Location: [facility];  Service: Cardiovascular;  Laterality: N/A;  . CORONARY ARTERY BYPASS GRAFT N/A 04/06/2015   Procedure: CORONARY ARTERY BYPASS GRAFT TIMES FOUR  USING LEFT INTERNAL MAMMARY ARTERY TO THE LEFT ANTERIOR DESCENDING, BILATERAL GREATER SAPHENOUS ENDOVEIN HARVEST GRAFT TO PROXIMAL AND MID POSTERIOR DESCENDING AND DIAGONAL CORONARY ARTERIES AND PLACEMENT OR RIGHT FEMORAL A-LINE. ;  Surgeon: [name], MD;  Location: [facility];  Service: Open Heart Surgery;  Laterality: N/A;  . HERNIA REPAIR    . TEE WITHOUT CARDIOVERSION N/A 04/06/2015   Procedure: TRANSESOPHAGEAL ECHOCARDIOGRAM (TEE);  Surgeon: [name], MD;  Location: [facility];  Service: Open Heart Surgery;  Laterality: N/A;  . TONSILLECTOMY      Allergies  No Known Allergies  History of Present Illness    71 year old male with the above complex past medical history. He suffered an inferior ST segment elevation myocardial infarction in December 2016 with catheterization revealing an occluded RCA and also severe LAD and circumflex disease. The RCA was successfully treated with a drug-eluting stent. Periprocedural course was complicated by cardiogenic shock, ventricular fibrillation, and complete heart block. He also developed atrial fibrillation. During the same admission, he developed a right  lower extremity DVT and right upper lobe pulmonary embolus. He was then placed on Xarelto and completed a course of anticoagulation.  Ultimately, it was felt that he would require more complete revascularization given residual LAD disease. The circumflex was not felt to be amenable to PCI. He underwent relook catheterization in March which showed patency of the  RCA stent with residual distal RPDA disease as well as proximal and mid LAD disease, and ostial circumflex disease. Patient  underwent successful coronary artery bypass grafting 4. Postoperative course was complicated by atrial fibrillation which was treated successfully with amiodarone therapy. Post DC he developed CHF and was treated with increased dose of lasix. On follow up he was noted to be markedly bradycardic and metoprolol and amiodarone were discontinued. Follow up Echo in July showed some improvement in EF to 35-40%. ICD deferred. Event monitor did not show Afib. On his last visit he was started on low dose lisinopril. Iron therapy was stopped with normalization of Hgb to 12.8.  On follow up today he is doing very well.  He  denies dyspnea or chest pain. Energy level is improved.  minmal  edema. BP is low  and he does note some lightheadedness when he stands quickly. He did develop a cough on lisinopril that resolved when switched to losartan. He is under a lot of stress getting ready to retire in next 2 months.  Home Medications   Allergies as of 03/19/2016   No Known Allergies     Medication List       Accurate as of 03/19/16 11:12 AM. Always use your most recent med list.          aspirin EC 81 MG tablet Take 81 mg by mouth daily.   atorvastatin 80 MG tablet Commonly known as:  LIPITOR TAKE 1 TABLET (80 MG TOTAL) BY MOUTH DAILY AT 6 PM.   famotidine 20 MG tablet Commonly known as:  PEPCID Take 20 mg by mouth 2 (two) times daily. Pt reports he takes prn depending on dietary intake   furosemide 40 MG tablet Commonly known as:  LASIX Take 1 tablet (40 mg total) by mouth daily. Take 1 tablets daily   KLOR-CON M20 20 MEQ tablet Generic drug:  potassium chloride SA TAKE 2 TABLETS BY MOUTH EVERY DAY   levothyroxine 50 MCG tablet Commonly known as:  SYNTHROID, LEVOTHROID Take 1 tablet (50 mcg total) by mouth 2 (two) times daily.   losartan 25 MG tablet Commonly known as:   COZAAR Take 1 tablet by mouth daily.   multivitamin with minerals Tabs tablet Take 1 tablet by mouth daily.   PROAIR HFA 108 (90 Base) MCG/ACT inhaler Generic drug:  albuterol   Vitamin D3 5000 units Tabs Take 5,000 Units by mouth daily.         Review of Systems    As noted in HPI. All other systems reviewed and are otherwise negative except as noted above.  Physical Exam    VS:  BP (!) 92/58   Pulse 61   Ht 5' 7.5" (1.715 m)   Wt 193 lb 6.4 oz (87.7 kg)   SpO2 97%   BMI 29.84 kg/m  , BMI Body mass index is 29.84 kg/m. GEN: Well nourished, well developed, in no acute distress.  HEENT: normal.  Neck: Supple, no JVD, carotid bruits, or masses. Cardiac: RRR, no murmurs, rubs, or gallops. No clubbing, cyanosis, trace edema. Radials/DP/PT 2+ and equal bilaterally.  Respiratory:  Respirations regular and unlabored, clear  to auscultation bilaterally. GI: Soft, nontender, nondistended, BS + x 4. MS: no deformity or atrophy. Skin: warm and dry, no rash. Neuro:  Strength and sensation are intact. Psych: Normal affect.  Accessory Clinical Findings       Lab Results  Component Value Date   WBC 8.0 10/10/2015   HGB 12.8 (L) 10/10/2015   HCT 38.4 (L) 10/10/2015   PLT 304 10/10/2015   GLUCOSE 123 (H) 10/10/2015   CHOL 113 (L) 10/10/2015   TRIG 45 10/10/2015   HDL 49 10/10/2015   LDLCALC 55 10/10/2015   ALT 28 10/10/2015   AST 30 10/10/2015   NA 141 10/10/2015   K 4.1 10/10/2015   CL 101 10/10/2015   CREATININE 1.22 (H) 10/10/2015   BUN 22 10/10/2015   CO2 30 10/10/2015   TSH 5.89 (H) 06/01/2015   INR 1.64 (H) 04/06/2015   HGBA1C 5.9 (H) 12/07/2014   Echo: 07/18/15: Study Conclusions  - Left ventricle: The cavity size was mildly dilated. Systolic   function was moderately reduced. The estimated ejection fraction   was in the range of 35% to 40%. Diffuse hypokinesis. There is   akinesis of the entireinferolateral and inferior myocardium.   Features are  consistent with a pseudonormal left ventricular   filling pattern, with concomitant abnormal relaxation and   increased filling pressure (grade 2 diastolic dysfunction).   Doppler parameters are consistent with high ventricular filling   pressure. - Aortic valve: Moderate diffuse thickening and calcification.   There was trivial regurgitation. - Mitral valve: There was mild regurgitation. - Left atrium: The atrium was mildly dilated. - Right atrium: The atrium was mildly dilated. - Tricuspid valve: There was trivial regurgitation. - Pulmonary arteries: PA peak pressure: 31 mm Hg (S).  Assessment & Plan    1.  Coronary artery disease: Status post coronary artery bypass grafting 4 . Prior inferior STEMi treated with DES.  Continue aspirin, statin. Off beta blocker due to low. BP. May stop Plavix now since he is > one year out from stent (4.0 x 20 Promus).   2. Ischemic cardiopathy/chronic systolic congestive heart failure: EF 30-35% by echo and cath in March 2017. Repeat EF by Echo in July 35-40%. He  has improved with titration of his Lasix. LE edema resolved. Recommend he reduce lasix to 40 mg daily.  Beta blocker discontinued due to marked bradycardia. Continue losartan.  3. Postoperative atrial fibrillation/prolonged QT: He is maintaining NSR off amiodarone. QTc normalized. Will monitor.   4. Marked sinus bradycardia- improved. Resolved off metoprolol and amiodarone.  5. Hyperlipidemia: Continue high potency statin therapy. Repeat fasting lab work in 6 months.  6.  H/o DVT/PE:  He has completed a 3 month course of xarelto.  7. Dispo:   f/u with me in 6 months.    Brazil Voytko Martinique, MD,FACC  03/19/2016, 11:12 AM

## 2016-03-19 ENCOUNTER — Ambulatory Visit (INDEPENDENT_AMBULATORY_CARE_PROVIDER_SITE_OTHER): Payer: 59 | Admitting: Cardiology

## 2016-03-19 ENCOUNTER — Encounter: Payer: Self-pay | Admitting: Cardiology

## 2016-03-19 ENCOUNTER — Ambulatory Visit: Payer: 59 | Admitting: Cardiology

## 2016-03-19 VITALS — BP 92/58 | HR 61 | Ht 67.5 in | Wt 193.4 lb

## 2016-03-19 DIAGNOSIS — I10 Essential (primary) hypertension: Secondary | ICD-10-CM | POA: Diagnosis not present

## 2016-03-19 DIAGNOSIS — I2510 Atherosclerotic heart disease of native coronary artery without angina pectoris: Secondary | ICD-10-CM | POA: Diagnosis not present

## 2016-03-19 DIAGNOSIS — I255 Ischemic cardiomyopathy: Secondary | ICD-10-CM | POA: Diagnosis not present

## 2016-03-19 DIAGNOSIS — Z951 Presence of aortocoronary bypass graft: Secondary | ICD-10-CM | POA: Diagnosis not present

## 2016-03-19 DIAGNOSIS — E7800 Pure hypercholesterolemia, unspecified: Secondary | ICD-10-CM

## 2016-03-19 MED ORDER — FUROSEMIDE 40 MG PO TABS: 40.0000 mg | ORAL_TABLET | Freq: Every day | ORAL | 3 refills | Status: DC

## 2016-03-19 NOTE — Patient Instructions (Signed)
Stop taking Plavix  Reduce lasix to 40 mg daily  Continue your other therapy  I will see you in 6 months.

## 2016-04-17 DIAGNOSIS — L565 Disseminated superficial actinic porokeratosis (DSAP): Secondary | ICD-10-CM | POA: Diagnosis not present

## 2016-04-17 DIAGNOSIS — Z85828 Personal history of other malignant neoplasm of skin: Secondary | ICD-10-CM | POA: Diagnosis not present

## 2016-04-17 DIAGNOSIS — L905 Scar conditions and fibrosis of skin: Secondary | ICD-10-CM | POA: Diagnosis not present

## 2016-04-17 DIAGNOSIS — L570 Actinic keratosis: Secondary | ICD-10-CM | POA: Diagnosis not present

## 2016-06-24 ENCOUNTER — Other Ambulatory Visit: Payer: Self-pay | Admitting: Cardiology

## 2016-06-26 DIAGNOSIS — L821 Other seborrheic keratosis: Secondary | ICD-10-CM | POA: Diagnosis not present

## 2016-06-26 DIAGNOSIS — L570 Actinic keratosis: Secondary | ICD-10-CM | POA: Diagnosis not present

## 2016-06-26 DIAGNOSIS — L910 Hypertrophic scar: Secondary | ICD-10-CM | POA: Diagnosis not present

## 2016-06-26 DIAGNOSIS — D239 Other benign neoplasm of skin, unspecified: Secondary | ICD-10-CM | POA: Diagnosis not present

## 2016-06-26 DIAGNOSIS — L814 Other melanin hyperpigmentation: Secondary | ICD-10-CM | POA: Diagnosis not present

## 2016-07-01 NOTE — Telephone Encounter (Signed)
This should be filled by his primary care  Johncarlo Maalouf MD, FACC   

## 2016-07-10 ENCOUNTER — Other Ambulatory Visit: Payer: Self-pay | Admitting: *Deleted

## 2016-07-11 NOTE — Telephone Encounter (Signed)
This should be filled by primary MD

## 2016-07-16 NOTE — Addendum Note (Signed)
Addended by: Kathyrn Lass on: 07/16/2016 10:00 AM   Modules accepted: Orders

## 2016-07-31 ENCOUNTER — Other Ambulatory Visit: Payer: Self-pay | Admitting: Cardiology

## 2016-08-21 ENCOUNTER — Other Ambulatory Visit: Payer: Self-pay | Admitting: Cardiology

## 2016-09-18 ENCOUNTER — Other Ambulatory Visit: Payer: Self-pay | Admitting: Cardiology

## 2016-09-19 DIAGNOSIS — Z23 Encounter for immunization: Secondary | ICD-10-CM | POA: Diagnosis not present

## 2016-09-26 NOTE — Progress Notes (Signed)
Office Visit    Patient Name: Ryan Ward Date of Encounter: 10/01/2016  Primary Care Provider:  Leonard Downing, MD Primary Cardiologist:  P. Martinique, MD   Chief Complaint    71 year old male status post coronary artery bypass grafting, who presents for follow-up of CAD and CHF.  Past Medical History    Past Medical History:  Diagnosis Date  . Asthma    VERY MILD  . CAD (coronary artery disease)    a. 12/07/14 Inf STEMI/PCI: LAD 85p/m, 11m/d, LCX 80ost, RCA 30p/m, 18m/d, 100d (4.0x20 Promus Premier DES). Hosp course complicated by VF arrest, CGS, CHB req temp wire; b. 03/2015 Cath: LAD 85p/m, 95d, LCx 80ost, RCA 30-40p/m/d, RPDA 75, EF 30-35%, Nl CO; c. 04/06/2015 CABGx4 (LIMA->LAD, VG->Diag, Seq VG->prox RPDA->mid RPDA.  [name] Chronic systolic CHF (congestive heart failure) ([place])    a. 03/2015 Echo: EF 30-35%, diff HK.  [name] Hyperlipidemia   . Hypertensive heart disease   . Ischemic cardiomyopathy    a. 03/2015 Echo: EF 30-35%, diff HK, mild MR, mod dil RA, PASP 68mmHg.  . OSA (obstructive sleep apnea)    a. not using CPAP at home, does have machine  . Paroxysmal atrial fibrillation ([place])    a. 12/2014 In setting of Inf MI->convertred spont.  . Pulmonary embolism ([place])    a. 12/2014 R PT and peroneal vein DVT and RUL PE-->Xarelto x 3 mos.   Past Surgical History:  Procedure Laterality Date  . CARDIAC CATHETERIZATION N/A 12/07/2014   Procedure: Coronary Stent Intervention;  Surgeon: Lawson Mahone M Martinique, MD;  Location: [facility];  Service: Cardiovascular;  Laterality: N/A;  STEMI  . CARDIAC CATHETERIZATION N/A 12/07/2014   Procedure: IABP Insertion;  Surgeon: Garett Tetzloff M Martinique, MD;  Location: [facility];  Service: Cardiovascular;  Laterality: N/A;  . CARDIAC CATHETERIZATION N/A 12/07/2014   Procedure: Temporary Pacemaker;  Surgeon: Jasara Corrigan M Martinique, MD;  Location: [facility];  Service: Cardiovascular;  Laterality: N/A;  . CARDIAC CATHETERIZATION N/A 12/07/2014   Procedure: Left Heart Cath and Coronary Angiography;  Surgeon: Cozette Braggs M Martinique, MD;  Location: [facility];  Service: Cardiovascular;  Laterality: N/A;  . CARDIAC CATHETERIZATION N/A 03/29/2015   Procedure: Right/Left Heart Cath and Coronary Angiography;  Surgeon: Maahir Horst M Martinique, MD;  Location: [facility];  Service: Cardiovascular;  Laterality: N/A;  . CORONARY ARTERY BYPASS GRAFT N/A 04/06/2015   Procedure: CORONARY ARTERY BYPASS GRAFT TIMES FOUR  USING LEFT INTERNAL MAMMARY ARTERY TO THE LEFT ANTERIOR DESCENDING, BILATERAL GREATER SAPHENOUS ENDOVEIN HARVEST GRAFT TO PROXIMAL AND MID POSTERIOR DESCENDING AND DIAGONAL CORONARY ARTERIES AND PLACEMENT OR RIGHT FEMORAL A-LINE. ;  Surgeon: [name], MD;  Location: [facility];  Service: Open Heart Surgery;  Laterality: N/A;  . HERNIA REPAIR    . TEE WITHOUT CARDIOVERSION N/A 04/06/2015   Procedure: TRANSESOPHAGEAL ECHOCARDIOGRAM (TEE);  Surgeon: [name], MD;  Location: [facility];  Service: Open Heart Surgery;  Laterality: N/A;  . TONSILLECTOMY      Allergies  No Known Allergies  History of Present Illness    71 year old male with the above complex past medical history. He suffered an inferior ST segment elevation myocardial infarction in December 2016 with catheterization revealing an occluded RCA and also severe LAD and circumflex disease. The RCA was successfully treated with a drug-eluting stent. Periprocedural course was complicated by cardiogenic shock, ventricular fibrillation, and complete heart block. He also developed atrial fibrillation. During the same admission,  he developed a right lower extremity DVT and right upper lobe pulmonary embolus. He was then placed on Xarelto and completed a course of anticoagulation.  Ultimately, it was felt that he would require more complete revascularization given residual LAD disease. The circumflex was not felt to be amenable to PCI. He underwent relook catheterization in March 2017 which  showed patency of the RCA stent with residual distal RPDA disease as well as proximal and mid LAD disease, and ostial circumflex disease. Patient  underwent successful coronary artery bypass grafting 4. Postoperative course was complicated by atrial fibrillation which was treated successfully with amiodarone therapy. Post DC he developed CHF and was treated with increased dose of lasix. On follow up he was noted to be markedly bradycardic and metoprolol and amiodarone were discontinued. Follow up Echo in July showed some improvement in EF to 35-40%. ICD deferred. Event monitor did not show Afib.  On follow up today he is doing very well.  He  denies dyspnea or chest pain. Energy level is good. No edema but did note that when he missed lasix for 2 days his weight went up 5 lbs.  BP is low and he does note some lightheadedness when he stands quickly or bends over. He has learned to deal with this. He is active doing a lot of yard work.  Home Medications   Allergies as of 10/01/2016   No Known Allergies     Medication List       Accurate as of 10/01/16 10:05 AM. Always use your most recent med list.          aspirin EC 81 MG tablet Take 81 mg by mouth daily.   atorvastatin 80 MG tablet Commonly known as:  LIPITOR TAKE 1 TABLET (80 MG TOTAL) BY MOUTH DAILY AT 6 PM.   famotidine 20 MG tablet Commonly known as:  PEPCID Take 20 mg by mouth 2 (two) times daily. Pt reports he takes prn depending on dietary intake   furosemide 40 MG tablet Commonly known as:  LASIX Take 1 tablet (40 mg total) by mouth daily. Take 1 tablets daily   KLOR-CON M20 20 MEQ tablet Generic drug:  potassium chloride SA TAKE 2 TABLETS BY MOUTH EVERY DAY   levothyroxine 50 MCG tablet Commonly known as:  SYNTHROID, LEVOTHROID TAKE 1 TABLET (50 MCG TOTAL) BY MOUTH 2 (TWO) TIMES DAILY.   losartan 25 MG tablet Commonly known as:  COZAAR Take 1 tablet by mouth daily.   multivitamin with minerals Tabs tablet Take 1  tablet by mouth daily.   PROAIR HFA 108 (90 Base) MCG/ACT inhaler Generic drug:  albuterol   Vitamin D3 5000 units Tabs Take 5,000 Units by mouth daily.            Discharge Care Instructions        Start     Ordered   10/01/16 0000  EKG 12-Lead    Question:  Where should this test be performed  Answer:  Other   10/01/16 1000   10/01/16 6433  Basic metabolic panel     29/51/88 1003   10/01/16 0000  Lipid Panel w/o Chol/HDL Ratio     10/01/16 1003   10/01/16 0000  Hepatic function panel     10/01/16 1003   10/01/16 0000  HgB A1c     10/01/16 1003        Review of Systems    As noted in HPI. All other systems reviewed and are otherwise negative  except as noted above.  Physical Exam    VS:  BP 98/64   Pulse (!) 54   Ht 5\' 7"  (1.702 m)   Wt 193 lb (87.5 kg)   BMI 30.23 kg/m  , BMI Body mass index is 30.23 kg/m. GENERAL:  Well appearing WM in NAD HEENT:  PERRL, EOMI, sclera are clear. Oropharynx is clear. NECK:  No jugular venous distention, carotid upstroke brisk and symmetric, no bruits, no thyromegaly or adenopathy LUNGS:  Clear to auscultation bilaterally CHEST:  Unremarkable HEART:  RRR,  PMI not displaced or sustained,S1 and S2 within normal limits, no S3, no S4: no clicks, no rubs, no murmurs ABD:  Soft, nontender. BS +, no masses or bruits. No hepatomegaly, no splenomegaly EXT:  2 + pulses throughout, no edema, no cyanosis no clubbing SKIN:  Warm and dry.  No rashes NEURO:  Alert and oriented x 3. Cranial nerves II through XII intact. PSYCH:  Cognitively intact    Accessory Clinical Findings       Lab Results  Component Value Date   WBC 8.0 10/10/2015   HGB 12.8 (L) 10/10/2015   HCT 38.4 (L) 10/10/2015   PLT 304 10/10/2015   GLUCOSE 123 (H) 10/10/2015   CHOL 113 (L) 10/10/2015   TRIG 45 10/10/2015   HDL 49 10/10/2015   LDLCALC 55 10/10/2015   ALT 28 10/10/2015   AST 30 10/10/2015   NA 141 10/10/2015   K 4.1 10/10/2015   CL 101  10/10/2015   CREATININE 1.22 (H) 10/10/2015   BUN 22 10/10/2015   CO2 30 10/10/2015   TSH 5.89 (H) 06/01/2015   INR 1.64 (H) 04/06/2015   HGBA1C 5.9 (H) 12/07/2014   Ecg today shows NSR with occ. PVCs. Old inferior infarct. Incomplete RBBB. I have personally reviewed and interpreted this study.  Echo: 07/18/15: Study Conclusions  - Left ventricle: The cavity size was mildly dilated. Systolic   function was moderately reduced. The estimated ejection fraction   was in the range of 35% to 40%. Diffuse hypokinesis. There is   akinesis of the entireinferolateral and inferior myocardium.   Features are consistent with a pseudonormal left ventricular   filling pattern, with concomitant abnormal relaxation and   increased filling pressure (grade 2 diastolic dysfunction).   Doppler parameters are consistent with high ventricular filling   pressure. - Aortic valve: Moderate diffuse thickening and calcification.   There was trivial regurgitation. - Mitral valve: There was mild regurgitation. - Left atrium: The atrium was mildly dilated. - Right atrium: The atrium was mildly dilated. - Tricuspid valve: There was trivial regurgitation. - Pulmonary arteries: PA peak pressure: 31 mm Hg (S).  Assessment & Plan    1.  Coronary artery disease: Status post coronary artery bypass grafting 4 in March 2017 . Prior inferior STEMi treated with DES.  Continue aspirin, statin. Off beta blocker due to low BP and HR. No significant angina.  2. Ischemic cardiopathy/chronic systolic congestive heart failure: EF 30-35% by echo and cath in March 2017. Repeat EF by Echo in July 35-40%. Volume status is stable on lasix 40 mg daily.  Beta blocker discontinued due to marked bradycardia. Continue low dose losartan. If orthostatic symptoms worsen would need to consider stopping losartan.  3. Postoperative atrial fibrillation/prolonged QT: He is maintaining NSR off amiodarone. QTc normalized. Will monitor.   4.  Marked sinus bradycardia- resolved.  5. Hyperlipidemia: Continue high potency statin therapy. Will check fasting lab work today.  6.  H/o  DVT/PE:  Provoked. Resolved.   7. Dispo:   f/u with me in 6 months.    Login Muckleroy Martinique, MD,FACC  10/01/2016, 10:05 AM

## 2016-10-01 ENCOUNTER — Ambulatory Visit (INDEPENDENT_AMBULATORY_CARE_PROVIDER_SITE_OTHER): Payer: Medicare Other | Admitting: Cardiology

## 2016-10-01 ENCOUNTER — Encounter: Payer: Self-pay | Admitting: Cardiology

## 2016-10-01 VITALS — BP 98/64 | HR 54 | Ht 67.0 in | Wt 193.0 lb

## 2016-10-01 DIAGNOSIS — Z951 Presence of aortocoronary bypass graft: Secondary | ICD-10-CM

## 2016-10-01 DIAGNOSIS — I2510 Atherosclerotic heart disease of native coronary artery without angina pectoris: Secondary | ICD-10-CM

## 2016-10-01 DIAGNOSIS — I5042 Chronic combined systolic (congestive) and diastolic (congestive) heart failure: Secondary | ICD-10-CM

## 2016-10-01 DIAGNOSIS — E7800 Pure hypercholesterolemia, unspecified: Secondary | ICD-10-CM

## 2016-10-01 DIAGNOSIS — I255 Ischemic cardiomyopathy: Secondary | ICD-10-CM | POA: Diagnosis not present

## 2016-10-01 NOTE — Patient Instructions (Signed)
We will check blood work today  Continue your current therapy  I will see you in 6 months 

## 2016-10-02 LAB — HEPATIC FUNCTION PANEL
ALK PHOS: 87 IU/L (ref 39–117)
ALT: 23 IU/L (ref 0–44)
AST: 26 IU/L (ref 0–40)
Albumin: 4.5 g/dL (ref 3.5–4.8)
BILIRUBIN, DIRECT: 0.26 mg/dL (ref 0.00–0.40)
Bilirubin Total: 0.9 mg/dL (ref 0.0–1.2)
Total Protein: 6.4 g/dL (ref 6.0–8.5)

## 2016-10-02 LAB — BASIC METABOLIC PANEL
BUN/Creatinine Ratio: 19 (ref 10–24)
BUN: 21 mg/dL (ref 8–27)
CALCIUM: 9.6 mg/dL (ref 8.6–10.2)
CO2: 29 mmol/L (ref 20–29)
CREATININE: 1.08 mg/dL (ref 0.76–1.27)
Chloride: 106 mmol/L (ref 96–106)
GFR calc Af Amer: 79 mL/min/{1.73_m2} (ref >59)
GFR, EST NON AFRICAN AMERICAN: 69 mL/min/{1.73_m2} (ref >59)
Glucose: 86 mg/dL (ref 65–99)
Potassium: 4.3 mmol/L (ref 3.5–5.2)
SODIUM: 145 mmol/L — AB (ref 134–144)

## 2016-10-02 LAB — LIPID PANEL W/O CHOL/HDL RATIO
CHOLESTEROL TOTAL: 111 mg/dL (ref 100–199)
HDL: 54 mg/dL (ref >39)
LDL CALC: 47 mg/dL (ref 0–99)
TRIGLYCERIDES: 51 mg/dL (ref 0–149)
VLDL Cholesterol Cal: 10 mg/dL (ref 5–40)

## 2016-10-02 LAB — HEMOGLOBIN A1C
ESTIMATED AVERAGE GLUCOSE: 117 mg/dL
HEMOGLOBIN A1C: 5.7 % — AB (ref 4.8–5.6)

## 2016-12-31 IMAGING — CR DG CHEST 1V PORT
1 series · 1 of 1 positions shown · non-contrast
Comparison: 04/09/2015

CLINICAL DATA: Shortness of breath.  Coughing.

EXAM:
PORTABLE CHEST 1 VIEW

[AP]
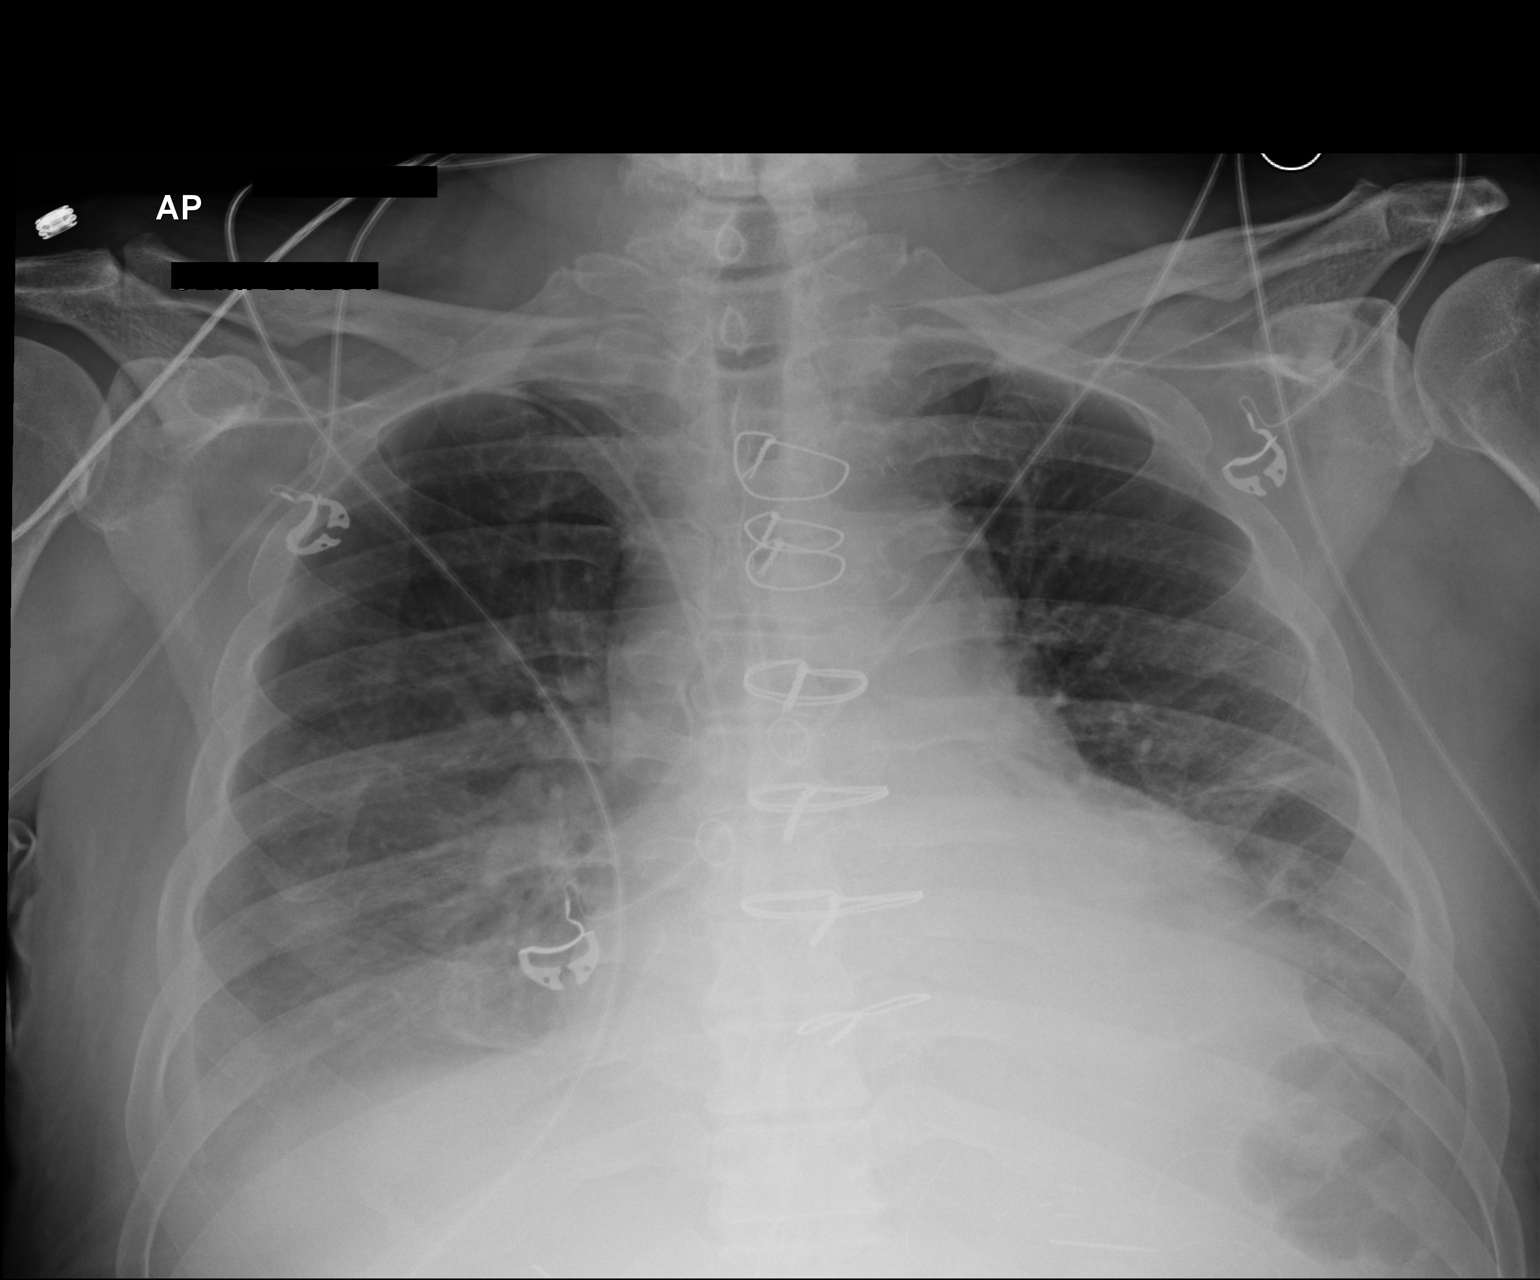

[1 of 1 positions shown; findings below may reference images not displayed]

FINDINGS: PICC line tip in the lower SVC region. Stable hazy densities in the
right lower chest. Stable densities at left lung base with
obscuration of left hemidiaphragm. Again noted are prominent linear
or interstitial densities in the left lower chest. Heart size
remains upper limits of normal but stable. Post CABG changes in the
chest. Negative for a pneumothorax.
IMPRESSION: Persistent bibasilar chest densities. Right chest densities are
likely associated with right pleural fluid. There appears to be
focal consolidation in the left lower lung, retrocardiac space.

Left chest atelectasis.

## 2017-01-01 IMAGING — CR DG CHEST 1V PORT
1 series · 1 of 1 positions shown · non-contrast
Comparison: 04/10/2015.

CLINICAL DATA: CABG.

EXAM:
PORTABLE CHEST 1 VIEW

[AP]
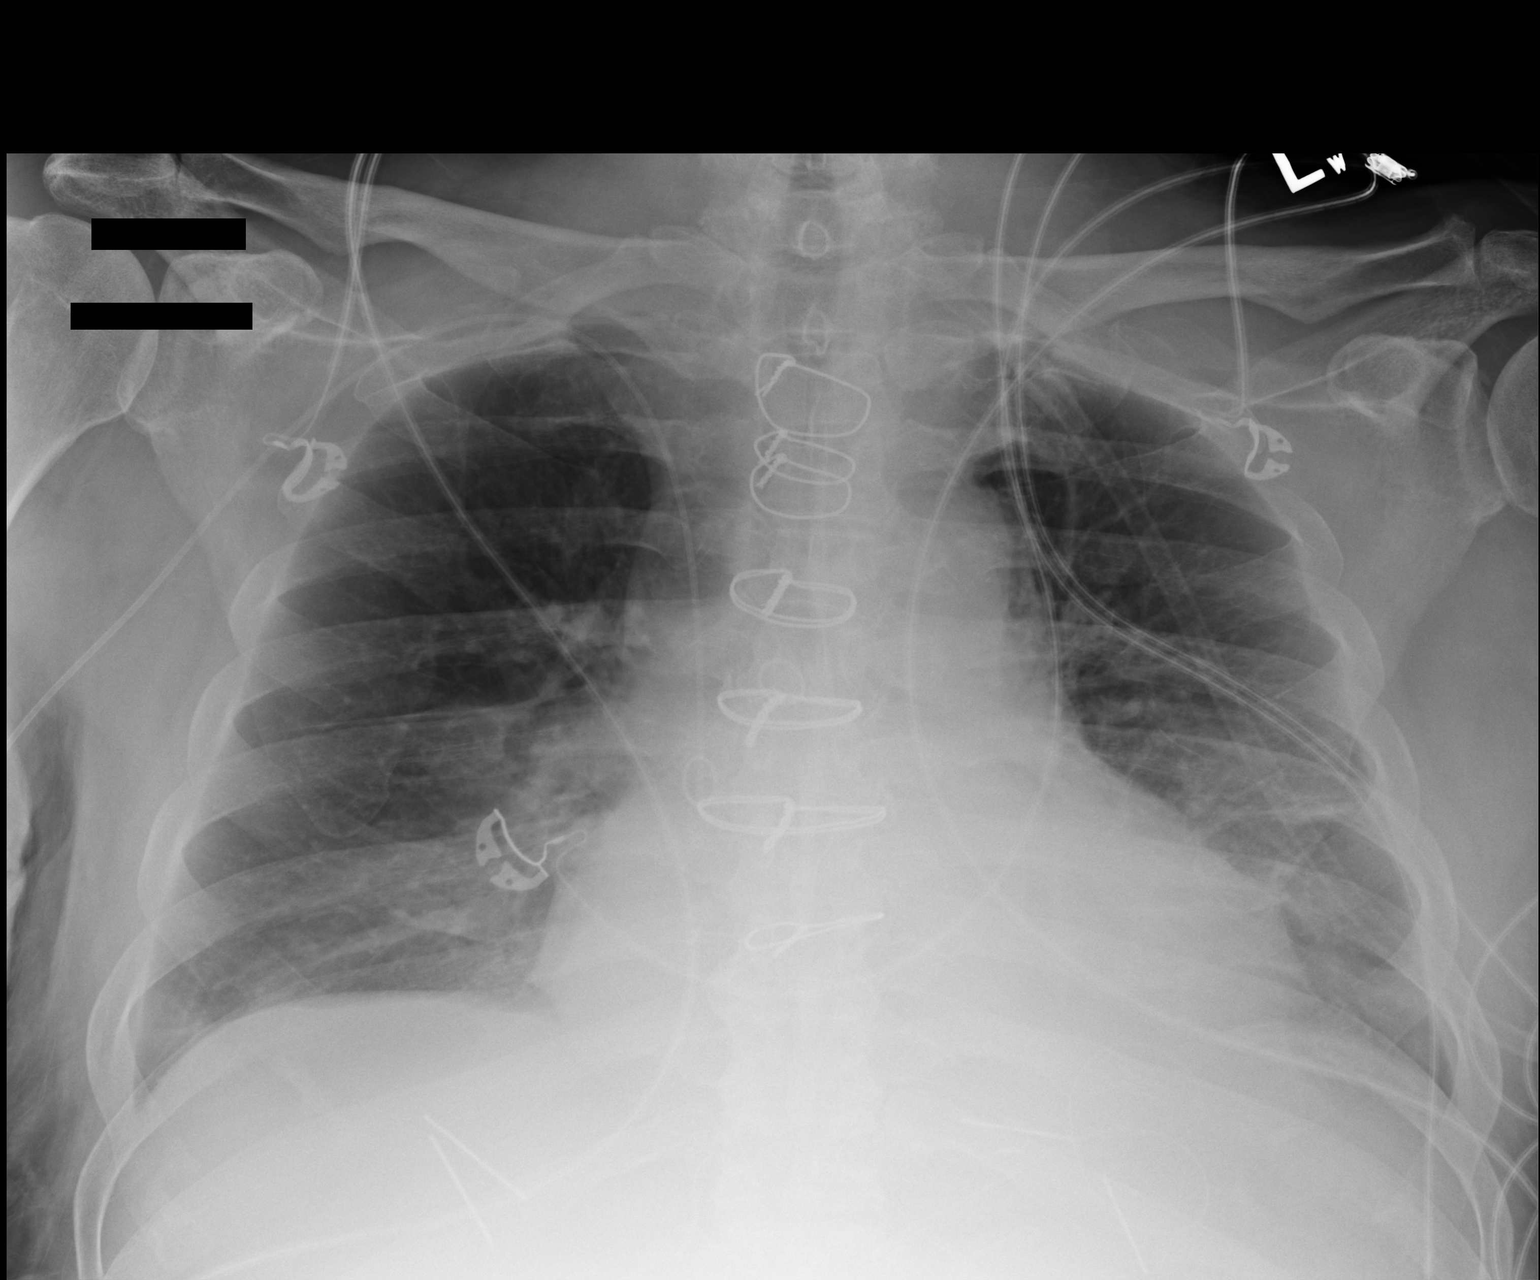

[1 of 1 positions shown; findings below may reference images not displayed]

FINDINGS: PICC line stable position. Prior CABG. Cardiomegaly with normal
pulmonary vascularity. Low lung volumes with bibasilar atelectasis
and/or infiltrates, slightly cleared from prior exam. No pleural
effusion or pneumothorax.
IMPRESSION: 1. Right PICC line in stable position. Prior CABG. Stable
cardiomegaly .
2. Persistent but slightly improving bibasilar infiltrates/edema and
basilar atelectasis.

## 2017-01-02 IMAGING — DX DG CHEST 2V
2 series · 2 of 2 positions shown · non-contrast
Comparison: 04/11/2015

CLINICAL DATA: Postop CABG.

EXAM:
CHEST  2 VIEW

[chest pa]
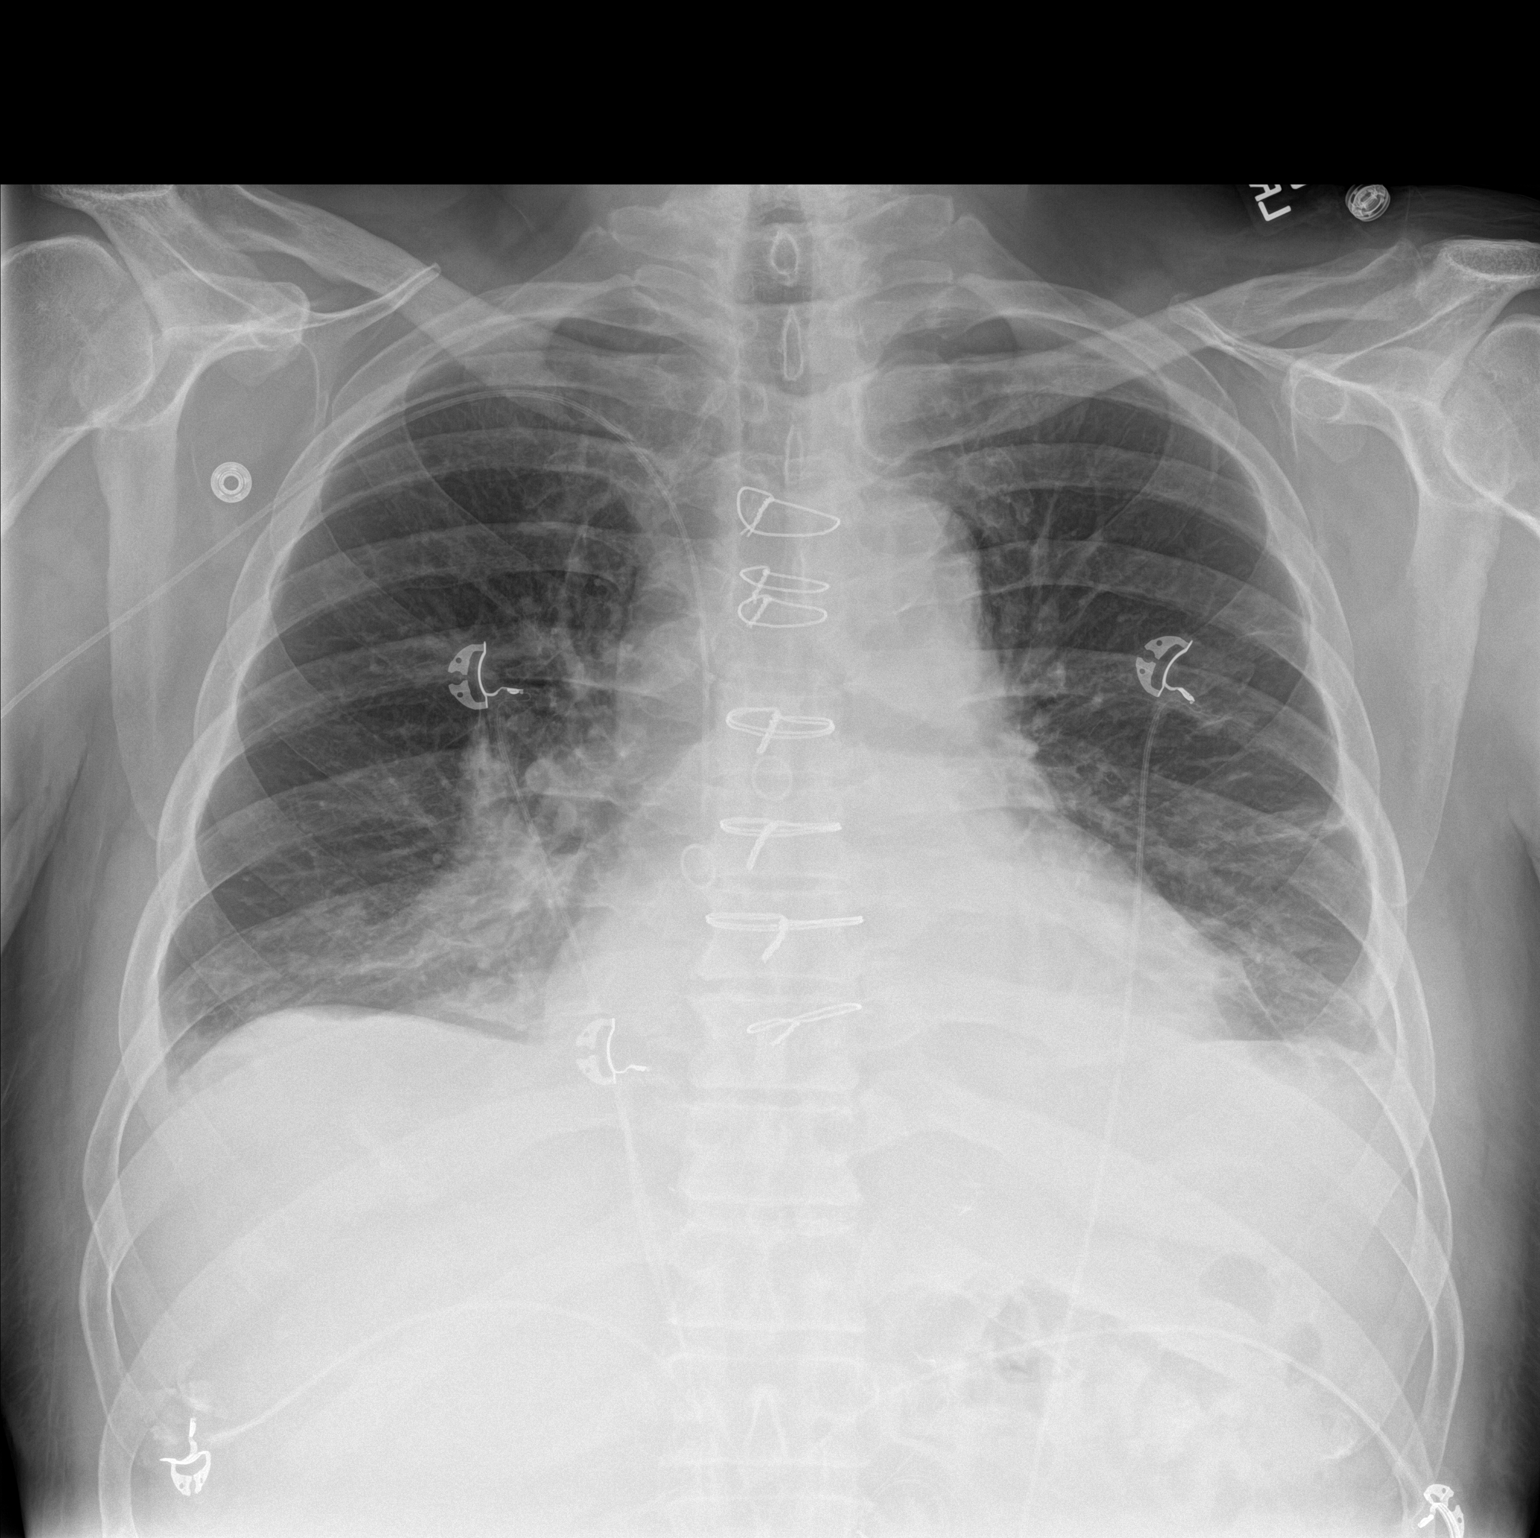

[chest lat]
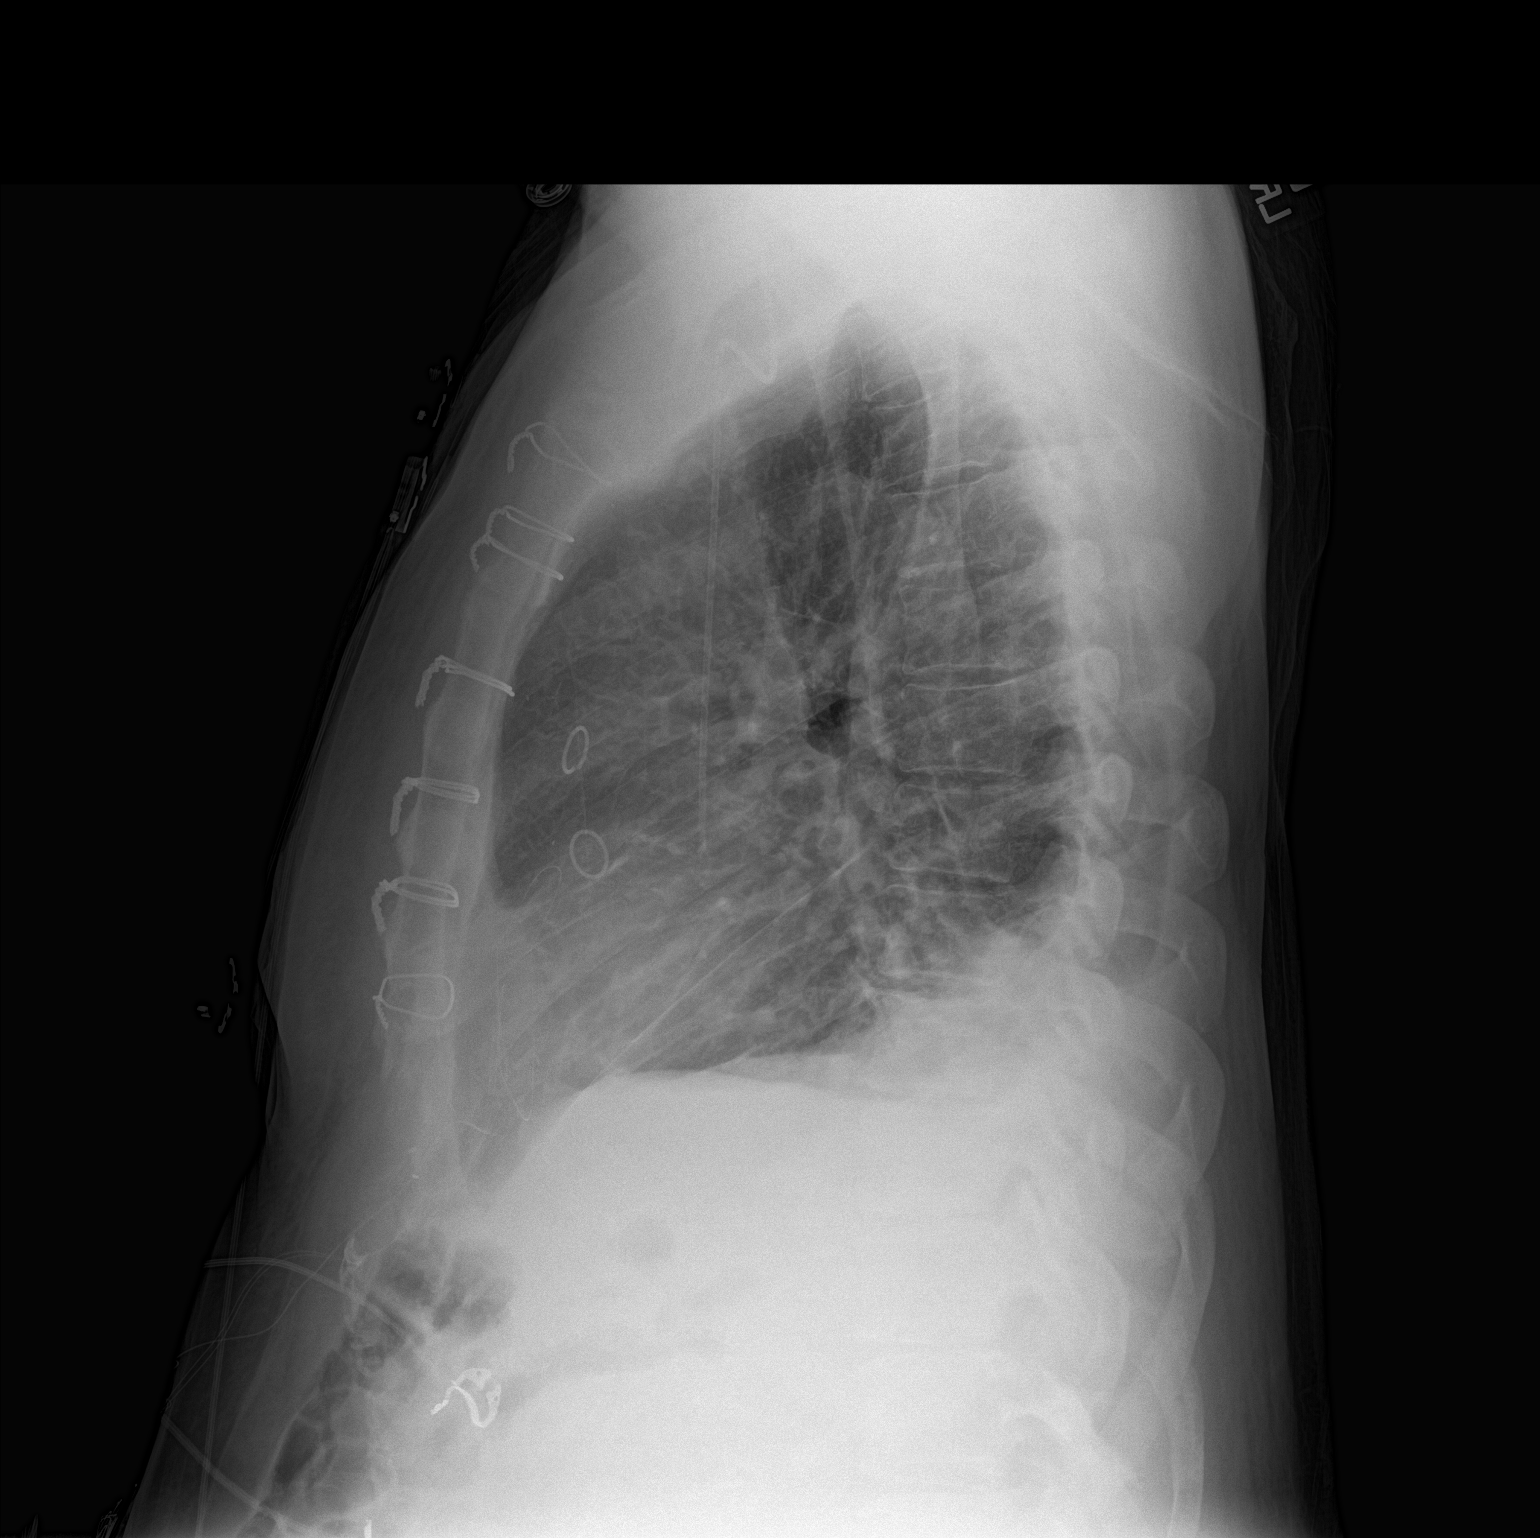

[2 of 2 positions shown; findings below may reference images not displayed]

FINDINGS: Right central line remains in place, unchanged. Prior CABG.
Bibasilar airspace opacities, likely atelectasis. Small bilateral
effusions. Mild cardiomegaly. No real change since prior study. No
pneumothorax. No acute bony abnormality.
IMPRESSION: Stable bibasilar opacities, likely atelectasis. Stable small
effusions.

## 2017-01-09 ENCOUNTER — Other Ambulatory Visit: Payer: Self-pay | Admitting: Cardiology

## 2017-01-24 ENCOUNTER — Telehealth: Payer: Self-pay | Admitting: Cardiology

## 2017-01-24 MED ORDER — LOSARTAN POTASSIUM 25 MG PO TABS: 25.0000 mg | ORAL_TABLET | Freq: Every day | ORAL | 2 refills | Status: DC

## 2017-01-24 NOTE — Telephone Encounter (Signed)
°*  STAT* If patient is at the pharmacy, call can be transferred to refill team.   1. Which medications need to be refilled? (please list name of each medication and dose if known) Losartan 25 mg  2. Which pharmacy/location (including street and city if local pharmacy) is medication to be sent to?[id number] - [place], [place]  3. Do they need a 30 day or 90 day supply? [place]

## 2017-03-08 IMAGING — CR DG CHEST 2V
1 series · 1 of 1 positions shown · non-contrast
Comparison: 05/09/2015

CLINICAL DATA: Patient underwent CABG surgery in April 2015. No
current complaints.

EXAM:
CHEST  2 VIEW

[w chest pa]
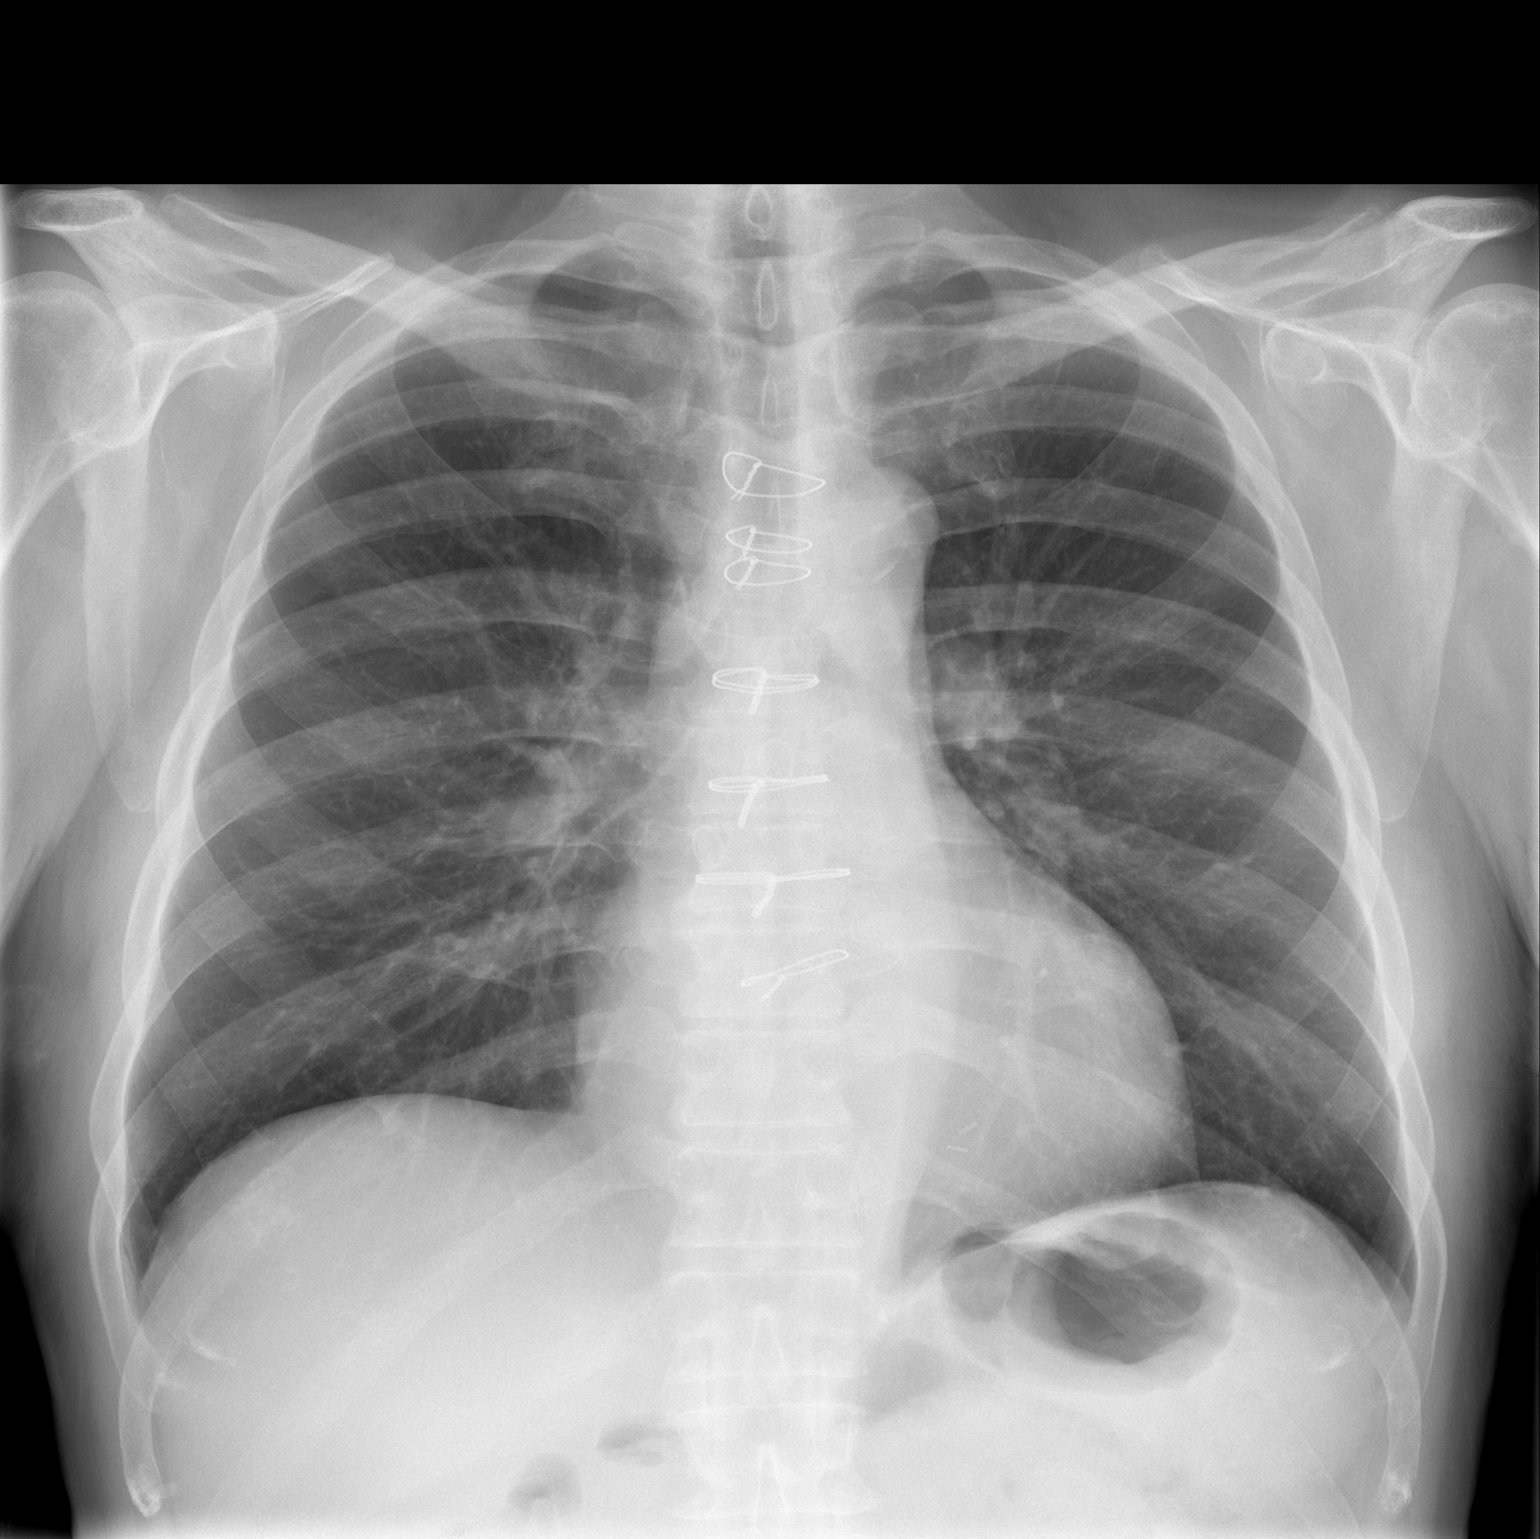

[1 of 1 positions shown; findings below may reference images not displayed]

FINDINGS: Changes from the recent prior CABG surgery are stable. Cardiac
silhouette is normal in size and configuration. Normal mediastinal
and hilar contours.

The lungs are clear. Lung base atelectasis and small effusions noted
on the prior study have resolved.

No pneumothorax.

Skeletal structures are intact.
IMPRESSION: No acute cardiopulmonary disease.

## 2017-03-30 NOTE — Progress Notes (Signed)
Office Visit    Patient Name: Ryan Ward Date of Encounter: 04/01/2017  Primary Care Provider:  Leonard Downing, MD Primary Cardiologist:  P. Martinique, MD   Chief Complaint    72 year old male status post coronary artery bypass grafting, who presents for follow-up of CAD and CHF.  Past Medical History    Past Medical History:  Diagnosis Date  . Asthma    VERY MILD  . CAD (coronary artery disease)    a. 12/07/14 Inf STEMI/PCI: LAD 85p/m, 51m/d, LCX 80ost, RCA 30p/m, 25m/d, 100d (4.0x20 Promus Premier DES). Hosp course complicated by VF arrest, CGS, CHB req temp wire; b. 03/2015 Cath: LAD 85p/m, 95d, LCx 80ost, RCA 30-40p/m/d, RPDA 75, EF 30-35%, Nl CO; c. 04/06/2015 CABGx4 (LIMA->LAD, VG->Diag, Seq VG->prox RPDA->mid RPDA.  [name] Chronic systolic CHF (congestive heart failure) ([place])    a. 03/2015 Echo: EF 30-35%, diff HK.  [name] Hyperlipidemia   . Hypertensive heart disease   . Ischemic cardiomyopathy    a. 03/2015 Echo: EF 30-35%, diff HK, mild MR, mod dil RA, PASP 2mmHg.  . OSA (obstructive sleep apnea)    a. not using CPAP at home, does have machine  . Paroxysmal atrial fibrillation ([place])    a. 12/2014 In setting of Inf MI->convertred spont.  . Pulmonary embolism ([place])    a. 12/2014 R PT and peroneal vein DVT and RUL PE-->Xarelto x 3 mos.   Past Surgical History:  Procedure Laterality Date  . CARDIAC CATHETERIZATION N/A 12/07/2014   Procedure: Coronary Stent Intervention;  Surgeon: Genavieve Mangiapane M Martinique, MD;  Location: [facility];  Service: Cardiovascular;  Laterality: N/A;  STEMI  . CARDIAC CATHETERIZATION N/A 12/07/2014   Procedure: IABP Insertion;  Surgeon: Makih Stefanko M Martinique, MD;  Location: [facility];  Service: Cardiovascular;  Laterality: N/A;  . CARDIAC CATHETERIZATION N/A 12/07/2014   Procedure: Temporary Pacemaker;  Surgeon: Teyton Pattillo M Martinique, MD;  Location: [facility];  Service: Cardiovascular;  Laterality: N/A;  . CARDIAC CATHETERIZATION N/A 12/07/2014   Procedure: Left Heart Cath and Coronary Angiography;  Surgeon: Arion Morgan M Martinique, MD;  Location: [facility];  Service: Cardiovascular;  Laterality: N/A;  . CARDIAC CATHETERIZATION N/A 03/29/2015   Procedure: Right/Left Heart Cath and Coronary Angiography;  Surgeon: Sophea Rackham M Martinique, MD;  Location: [facility];  Service: Cardiovascular;  Laterality: N/A;  . CORONARY ARTERY BYPASS GRAFT N/A 04/06/2015   Procedure: CORONARY ARTERY BYPASS GRAFT TIMES FOUR  USING LEFT INTERNAL MAMMARY ARTERY TO THE LEFT ANTERIOR DESCENDING, BILATERAL GREATER SAPHENOUS ENDOVEIN HARVEST GRAFT TO PROXIMAL AND MID POSTERIOR DESCENDING AND DIAGONAL CORONARY ARTERIES AND PLACEMENT OR RIGHT FEMORAL A-LINE. ;  Surgeon: [name], MD;  Location: [place];  Service: Open Heart Surgery;  Laterality: N/A;  . HERNIA REPAIR    . TEE WITHOUT CARDIOVERSION N/A 04/06/2015   Procedure: TRANSESOPHAGEAL ECHOCARDIOGRAM (TEE);  Surgeon: [name], MD;  Location: [facility];  Service: Open Heart Surgery;  Laterality: N/A;  . TONSILLECTOMY      Allergies  No Known Allergies  History of Present Illness    72 year old male with the above complex past medical history. He suffered an inferior ST segment elevation myocardial infarction in December 2016 with catheterization revealing an occluded RCA and also severe LAD and circumflex disease. The RCA was successfully treated with a drug-eluting stent. Periprocedural course was complicated by cardiogenic shock, ventricular fibrillation, and complete heart block. He also developed atrial fibrillation. During the same admission,  he developed a right lower extremity DVT and right upper lobe pulmonary embolus. He was then placed on Xarelto and completed a course of anticoagulation.  Ultimately, it was felt that he would require more complete revascularization given residual LAD disease. The circumflex was not felt to be amenable to PCI. He underwent relook catheterization in March 2017 which  showed patency of the RCA stent with residual distal RPDA disease as well as proximal and mid LAD disease, and ostial circumflex disease. Patient  underwent successful coronary artery bypass grafting 4. Postoperative course was complicated by atrial fibrillation which was treated successfully with amiodarone therapy. Post DC he developed CHF and was treated with increased dose of lasix. On follow up he was noted to be markedly bradycardic and metoprolol and amiodarone were discontinued. Follow up Echo in July 2017 showed some improvement in EF to 35-40%. ICD deferred. Event monitor did not show Afib.  On follow up today he is doing very well.  He  denies dyspnea or chest pain. He does note that when he bends over or stoops he gets lightheaded when he stands up. He has not been as active this winter and has gained 9 lbs. Reports no increase in edema. No palpitations. Planning on painting his house this spring.   Home Medications   Allergies as of 04/01/2017   No Known Allergies     Medication List        Accurate as of 04/01/17  9:03 AM. Always use your most recent med list.          aspirin EC 81 MG tablet Take 81 mg by mouth daily.   atorvastatin 80 MG tablet Commonly known as:  LIPITOR TAKE 1 TABLET (80 MG TOTAL) BY MOUTH DAILY AT 6 PM.   famotidine 20 MG tablet Commonly known as:  PEPCID Take 20 mg by mouth 2 (two) times daily. Pt reports he takes prn depending on dietary intake   furosemide 40 MG tablet Commonly known as:  LASIX Take 1 tablet (40 mg total) by mouth daily. Take 1 tablets daily   KLOR-CON M20 20 MEQ tablet Generic drug:  potassium chloride SA TAKE 2 TABLETS BY MOUTH EVERY DAY   levothyroxine 50 MCG tablet Commonly known as:  SYNTHROID, LEVOTHROID TAKE 1 TABLET (50 MCG TOTAL) BY MOUTH 2 (TWO) TIMES DAILY.   losartan 25 MG tablet Commonly known as:  COZAAR Take 1 tablet (25 mg total) by mouth daily.   multivitamin with minerals Tabs tablet Take 1 tablet by  mouth daily.   PROAIR HFA 108 (90 Base) MCG/ACT inhaler Generic drug:  albuterol as needed.   Vitamin D3 5000 units Tabs Take 5,000 Units by mouth daily.         Review of Systems    As noted in HPI. All other systems reviewed and are otherwise negative except as noted above.  Physical Exam    VS:  BP 102/66   Pulse 60   Ht 5\' 8"  (1.727 m)   Wt 202 lb 6.4 oz (91.8 kg)   SpO2 96%   BMI 30.77 kg/m  , BMI Body mass index is 30.77 kg/m. GENERAL:  Well appearing HEENT:  PERRL, EOMI, sclera are clear. Oropharynx is clear. NECK:  No jugular venous distention, carotid upstroke brisk and symmetric, no bruits, no thyromegaly or adenopathy LUNGS:  Clear to auscultation bilaterally CHEST:  Unremarkable HEART:  RRR,  PMI not displaced or sustained,S1 and S2 within normal limits, no S3, no S4: no clicks,  no rubs, no murmurs ABD:  Soft, nontender. BS +, no masses or bruits. No hepatomegaly, no splenomegaly EXT:  2 + pulses throughout, trace right ankle edema, no cyanosis no clubbing SKIN:  Warm and dry.  No rashes NEURO:  Alert and oriented x 3. Cranial nerves II through XII intact. PSYCH:  Cognitively intact      Accessory Clinical Findings       Lab Results  Component Value Date   WBC 8.0 10/10/2015   HGB 12.8 (L) 10/10/2015   HCT 38.4 (L) 10/10/2015   PLT 304 10/10/2015   GLUCOSE 86 10/01/2016   CHOL 111 10/01/2016   TRIG 51 10/01/2016   HDL 54 10/01/2016   LDLCALC 47 10/01/2016   ALT 23 10/01/2016   AST 26 10/01/2016   NA 145 (H) 10/01/2016   K 4.3 10/01/2016   CL 106 10/01/2016   CREATININE 1.08 10/01/2016   BUN 21 10/01/2016   CO2 29 10/01/2016   TSH 5.89 (H) 06/01/2015   INR 1.64 (H) 04/06/2015   HGBA1C 5.7 (H) 10/01/2016    Echo: 07/18/15: Study Conclusions  - Left ventricle: The cavity size was mildly dilated. Systolic   function was moderately reduced. The estimated ejection fraction   was in the range of 35% to 40%. Diffuse hypokinesis. There  is   akinesis of the entireinferolateral and inferior myocardium.   Features are consistent with a pseudonormal left ventricular   filling pattern, with concomitant abnormal relaxation and   increased filling pressure (grade 2 diastolic dysfunction).   Doppler parameters are consistent with high ventricular filling   pressure. - Aortic valve: Moderate diffuse thickening and calcification.   There was trivial regurgitation. - Mitral valve: There was mild regurgitation. - Left atrium: The atrium was mildly dilated. - Right atrium: The atrium was mildly dilated. - Tricuspid valve: There was trivial regurgitation. - Pulmonary arteries: PA peak pressure: 31 mm Hg (S).  Assessment & Plan    1.  Coronary artery disease: Status post coronary artery bypass grafting 4 in March 2017 . Prior inferior STEMi treated with DES.  He is asymptomatic. Continue aspirin, statin. Off beta blocker due to low BP and HR.   2. Ischemic cardiopathy/chronic systolic congestive heart failure: EF 30-35% by echo and cath in March 2017. Repeat EF by Echo in July 35-40%. Volume status looks ok on lasix 40 mg daily.  Beta blocker discontinued due to marked bradycardia. Continue low dose losartan.  I think his weight gain is more due to excess calories and not fluid.   3. Postoperative atrial fibrillation/prolonged QT: He is maintaining NSR off amiodarone. QTc normalized. Will monitor.   4. Marked sinus bradycardia- resolved.  5. Hyperlipidemia: Continue high potency statin therapy. Excellent control  6.  H/o DVT/PE:  Provoked. Resolved.   7. Dispo:   f/u with me in 6 months.    Cisco Kindt Martinique, MD,FACC  04/01/2017, 9:03 AM

## 2017-04-01 ENCOUNTER — Ambulatory Visit (INDEPENDENT_AMBULATORY_CARE_PROVIDER_SITE_OTHER): Payer: Medicare Other | Admitting: Cardiology

## 2017-04-01 ENCOUNTER — Encounter: Payer: Self-pay | Admitting: Cardiology

## 2017-04-01 VITALS — BP 102/66 | HR 60 | Ht 68.0 in | Wt 202.4 lb

## 2017-04-01 DIAGNOSIS — I10 Essential (primary) hypertension: Secondary | ICD-10-CM

## 2017-04-01 DIAGNOSIS — I5042 Chronic combined systolic (congestive) and diastolic (congestive) heart failure: Secondary | ICD-10-CM | POA: Diagnosis not present

## 2017-04-01 DIAGNOSIS — E7800 Pure hypercholesterolemia, unspecified: Secondary | ICD-10-CM | POA: Diagnosis not present

## 2017-04-01 DIAGNOSIS — I2510 Atherosclerotic heart disease of native coronary artery without angina pectoris: Secondary | ICD-10-CM

## 2017-04-01 DIAGNOSIS — Z951 Presence of aortocoronary bypass graft: Secondary | ICD-10-CM

## 2017-04-01 NOTE — Patient Instructions (Addendum)
Continue your current therapy  Get to work on increasing activity and weight loss  I will see you in 6 months

## 2017-04-02 ENCOUNTER — Other Ambulatory Visit: Payer: Self-pay | Admitting: Cardiology

## 2017-04-02 NOTE — Telephone Encounter (Signed)
Rx has been sent to the pharmacy electronically. ° °

## 2017-06-18 ENCOUNTER — Emergency Department (HOSPITAL_COMMUNITY): Payer: Medicare Other

## 2017-06-18 ENCOUNTER — Other Ambulatory Visit: Payer: Self-pay

## 2017-06-18 ENCOUNTER — Inpatient Hospital Stay (HOSPITAL_COMMUNITY)
Admission: EM | Admit: 2017-06-18 | Discharge: 2017-06-22 | DRG: 493 | Disposition: A | Discharge status: Home Health | Payer: Medicare Other | Attending: Student | Admitting: Student

## 2017-06-18 ENCOUNTER — Encounter (HOSPITAL_COMMUNITY): Payer: Self-pay | Admitting: Internal Medicine

## 2017-06-18 DIAGNOSIS — I5022 Chronic systolic (congestive) heart failure: Secondary | ICD-10-CM | POA: Diagnosis not present

## 2017-06-18 DIAGNOSIS — T1490XA Injury, unspecified, initial encounter: Secondary | ICD-10-CM

## 2017-06-18 DIAGNOSIS — M7989 Other specified soft tissue disorders: Secondary | ICD-10-CM | POA: Diagnosis not present

## 2017-06-18 DIAGNOSIS — M79601 Pain in right arm: Secondary | ICD-10-CM | POA: Diagnosis not present

## 2017-06-18 DIAGNOSIS — S8251XA Displaced fracture of medial malleolus of right tibia, initial encounter for closed fracture: Secondary | ICD-10-CM

## 2017-06-18 DIAGNOSIS — Z79899 Other long term (current) drug therapy: Secondary | ICD-10-CM | POA: Diagnosis not present

## 2017-06-18 DIAGNOSIS — Z951 Presence of aortocoronary bypass graft: Secondary | ICD-10-CM | POA: Diagnosis not present

## 2017-06-18 DIAGNOSIS — W11XXXA Fall on and from ladder, initial encounter: Secondary | ICD-10-CM

## 2017-06-18 DIAGNOSIS — S52121A Displaced fracture of head of right radius, initial encounter for closed fracture: Principal | ICD-10-CM

## 2017-06-18 DIAGNOSIS — S52041A Displaced fracture of coronoid process of right ulna, initial encounter for closed fracture: Secondary | ICD-10-CM | POA: Diagnosis not present

## 2017-06-18 DIAGNOSIS — S53194A Other dislocation of right ulnohumeral joint, initial encounter: Secondary | ICD-10-CM | POA: Diagnosis not present

## 2017-06-18 DIAGNOSIS — Z8249 Family history of ischemic heart disease and other diseases of the circulatory system: Secondary | ICD-10-CM

## 2017-06-18 DIAGNOSIS — S42401A Unspecified fracture of lower end of right humerus, initial encounter for closed fracture: Secondary | ICD-10-CM | POA: Diagnosis not present

## 2017-06-18 DIAGNOSIS — Z9089 Acquired absence of other organs: Secondary | ICD-10-CM | POA: Diagnosis not present

## 2017-06-18 DIAGNOSIS — Z7982 Long term (current) use of aspirin: Secondary | ICD-10-CM | POA: Diagnosis not present

## 2017-06-18 DIAGNOSIS — I959 Hypotension, unspecified: Secondary | ICD-10-CM | POA: Diagnosis not present

## 2017-06-18 DIAGNOSIS — M79603 Pain in arm, unspecified: Secondary | ICD-10-CM | POA: Diagnosis not present

## 2017-06-18 DIAGNOSIS — G4733 Obstructive sleep apnea (adult) (pediatric): Secondary | ICD-10-CM | POA: Diagnosis present

## 2017-06-18 DIAGNOSIS — I2510 Atherosclerotic heart disease of native coronary artery without angina pectoris: Secondary | ICD-10-CM | POA: Diagnosis present

## 2017-06-18 DIAGNOSIS — Y9389 Activity, other specified: Secondary | ICD-10-CM | POA: Diagnosis not present

## 2017-06-18 DIAGNOSIS — E785 Hyperlipidemia, unspecified: Secondary | ICD-10-CM | POA: Diagnosis present

## 2017-06-18 DIAGNOSIS — S0990XA Unspecified injury of head, initial encounter: Secondary | ICD-10-CM | POA: Diagnosis not present

## 2017-06-18 DIAGNOSIS — Z79890 Hormone replacement therapy (postmenopausal): Secondary | ICD-10-CM | POA: Diagnosis not present

## 2017-06-18 DIAGNOSIS — I110 Hypertensive heart disease with heart failure: Secondary | ICD-10-CM | POA: Diagnosis not present

## 2017-06-18 DIAGNOSIS — W132XXA Fall from, out of or through roof, initial encounter: Secondary | ICD-10-CM

## 2017-06-18 DIAGNOSIS — S0081XA Abrasion of other part of head, initial encounter: Secondary | ICD-10-CM | POA: Diagnosis not present

## 2017-06-18 DIAGNOSIS — Z86711 Personal history of pulmonary embolism: Secondary | ICD-10-CM

## 2017-06-18 DIAGNOSIS — S82841A Displaced bimalleolar fracture of right lower leg, initial encounter for closed fracture: Secondary | ICD-10-CM | POA: Diagnosis present

## 2017-06-18 DIAGNOSIS — S82391D Other fracture of lower end of right tibia, subsequent encounter for closed fracture with routine healing: Secondary | ICD-10-CM | POA: Diagnosis not present

## 2017-06-18 DIAGNOSIS — S52131A Displaced fracture of neck of right radius, initial encounter for closed fracture: Secondary | ICD-10-CM | POA: Diagnosis not present

## 2017-06-18 DIAGNOSIS — S62616A Displaced fracture of proximal phalanx of right little finger, initial encounter for closed fracture: Secondary | ICD-10-CM

## 2017-06-18 DIAGNOSIS — T148XXA Other injury of unspecified body region, initial encounter: Secondary | ICD-10-CM

## 2017-06-18 DIAGNOSIS — S199XXA Unspecified injury of neck, initial encounter: Secondary | ICD-10-CM | POA: Diagnosis not present

## 2017-06-18 DIAGNOSIS — S59901A Unspecified injury of right elbow, initial encounter: Secondary | ICD-10-CM | POA: Diagnosis not present

## 2017-06-18 DIAGNOSIS — I252 Old myocardial infarction: Secondary | ICD-10-CM

## 2017-06-18 DIAGNOSIS — Y92008 Other place in unspecified non-institutional (private) residence as the place of occurrence of the external cause: Secondary | ICD-10-CM | POA: Diagnosis not present

## 2017-06-18 DIAGNOSIS — I255 Ischemic cardiomyopathy: Secondary | ICD-10-CM | POA: Diagnosis not present

## 2017-06-18 DIAGNOSIS — R609 Edema, unspecified: Secondary | ICD-10-CM | POA: Diagnosis not present

## 2017-06-18 DIAGNOSIS — I480 Paroxysmal atrial fibrillation: Secondary | ICD-10-CM | POA: Diagnosis not present

## 2017-06-18 DIAGNOSIS — S62610A Displaced fracture of proximal phalanx of right index finger, initial encounter for closed fracture: Secondary | ICD-10-CM | POA: Diagnosis not present

## 2017-06-18 DIAGNOSIS — J45909 Unspecified asthma, uncomplicated: Secondary | ICD-10-CM | POA: Diagnosis present

## 2017-06-18 DIAGNOSIS — S8261XA Displaced fracture of lateral malleolus of right fibula, initial encounter for closed fracture: Secondary | ICD-10-CM | POA: Diagnosis not present

## 2017-06-18 DIAGNOSIS — S52121D Displaced fracture of head of right radius, subsequent encounter for closed fracture with routine healing: Secondary | ICD-10-CM | POA: Diagnosis not present

## 2017-06-18 DIAGNOSIS — R102 Pelvic and perineal pain: Secondary | ICD-10-CM | POA: Diagnosis not present

## 2017-06-18 DIAGNOSIS — M79641 Pain in right hand: Secondary | ICD-10-CM | POA: Diagnosis not present

## 2017-06-18 DIAGNOSIS — S62616D Displaced fracture of proximal phalanx of right little finger, subsequent encounter for fracture with routine healing: Secondary | ICD-10-CM | POA: Diagnosis not present

## 2017-06-18 DIAGNOSIS — W19XXXA Unspecified fall, initial encounter: Secondary | ICD-10-CM | POA: Diagnosis not present

## 2017-06-18 DIAGNOSIS — Z23 Encounter for immunization: Secondary | ICD-10-CM | POA: Diagnosis not present

## 2017-06-18 DIAGNOSIS — S3993XA Unspecified injury of pelvis, initial encounter: Secondary | ICD-10-CM | POA: Diagnosis not present

## 2017-06-18 DIAGNOSIS — S299XXA Unspecified injury of thorax, initial encounter: Secondary | ICD-10-CM | POA: Diagnosis not present

## 2017-06-18 DIAGNOSIS — M25521 Pain in right elbow: Secondary | ICD-10-CM | POA: Diagnosis not present

## 2017-06-18 HISTORY — DX: Acute embolism and thrombosis of unspecified deep veins of unspecified lower extremity: I82.409

## 2017-06-18 HISTORY — DX: Acute myocardial infarction, unspecified: I21.9

## 2017-06-18 HISTORY — DX: Personal history of other medical treatment: Z92.89

## 2017-06-18 LAB — COMPREHENSIVE METABOLIC PANEL
ALBUMIN: 3.9 g/dL (ref 3.5–5.0)
ALK PHOS: 81 U/L (ref 38–126)
ALT: 35 U/L (ref 17–63)
ANION GAP: 9 (ref 5–15)
AST: 35 U/L (ref 15–41)
BILIRUBIN TOTAL: 1 mg/dL (ref 0.3–1.2)
BUN: 18 mg/dL (ref 6–20)
CALCIUM: 9.1 mg/dL (ref 8.9–10.3)
CO2: 28 mmol/L (ref 22–32)
Chloride: 105 mmol/L (ref 101–111)
Creatinine, Ser: 1.25 mg/dL — ABNORMAL HIGH (ref 0.61–1.24)
GFR calc Af Amer: >60 mL/min (ref >60)
GFR calc non Af Amer: 56 mL/min — ABNORMAL LOW (ref >60)
GLUCOSE: 131 mg/dL — AB (ref 65–99)
POTASSIUM: 3.9 mmol/L (ref 3.5–5.1)
Sodium: 142 mmol/L (ref 135–145)
Total Protein: 6 g/dL — ABNORMAL LOW (ref 6.5–8.1)

## 2017-06-18 LAB — CBC WITH DIFFERENTIAL/PLATELET
Abs Immature Granulocytes: 0.1 10*3/uL (ref 0.0–0.1)
BASOS ABS: 0.1 10*3/uL (ref 0.0–0.1)
BASOS PCT: 1 %
EOS PCT: 1 %
Eosinophils Absolute: 0.2 10*3/uL (ref 0.0–0.7)
HCT: 44 % (ref 39.0–52.0)
Hemoglobin: 14.3 g/dL (ref 13.0–17.0)
Immature Granulocytes: 0 %
LYMPHS PCT: 9 %
Lymphs Abs: 1.4 10*3/uL (ref 0.7–4.0)
MCH: 30.2 pg (ref 26.0–34.0)
MCHC: 32.5 g/dL (ref 30.0–36.0)
MCV: 93 fL (ref 78.0–100.0)
MONO ABS: 1.1 10*3/uL — AB (ref 0.1–1.0)
Monocytes Relative: 7 %
NEUTROS ABS: 13 10*3/uL — AB (ref 1.7–7.7)
Neutrophils Relative %: 82 %
Platelets: 194 10*3/uL (ref 150–400)
RBC: 4.73 MIL/uL (ref 4.22–5.81)
RDW: 13.8 % (ref 11.5–15.5)
WBC: 15.9 10*3/uL — AB (ref 4.0–10.5)

## 2017-06-18 MED ORDER — ONDANSETRON HCL 4 MG PO TABS: 4.0000 mg | ORAL_TABLET | Freq: Four times a day (QID) | ORAL | Status: DC | PRN

## 2017-06-18 MED ORDER — TETANUS-DIPHTH-ACELL PERTUSSIS 5-2.5-18.5 LF-MCG/0.5 IM SUSP
0.5000 mL | Freq: Once | INTRAMUSCULAR | Status: AC
  Administered 2017-06-18: 0.5 mL via INTRAMUSCULAR
  Filled 2017-06-18: qty 0.5

## 2017-06-18 MED ORDER — DOCUSATE SODIUM 100 MG PO CAPS
100.0000 mg | ORAL_CAPSULE | Freq: Two times a day (BID) | ORAL | Status: DC
  Administered 2017-06-18 – 2017-06-22 (×7): 100 mg via ORAL
  Filled 2017-06-18 (×7): qty 1

## 2017-06-18 MED ORDER — ALPRAZOLAM 0.5 MG PO TABS: 0.5000 mg | ORAL_TABLET | Freq: Four times a day (QID) | ORAL | Status: DC | PRN

## 2017-06-18 MED ORDER — CEFAZOLIN SODIUM-DEXTROSE 1-4 GM/50ML-% IV SOLN
1.0000 g | Freq: Three times a day (TID) | INTRAVENOUS | Status: DC
  Administered 2017-06-19 (×2): 1 g via INTRAVENOUS
  Administered 2017-06-19: 2 g via INTRAVENOUS
  Filled 2017-06-18 (×5): qty 50

## 2017-06-18 MED ORDER — OXYCODONE HCL 5 MG PO TABS
5.0000 mg | ORAL_TABLET | ORAL | Status: DC | PRN
  Administered 2017-06-19: 10 mg via ORAL
  Filled 2017-06-18: qty 2

## 2017-06-18 MED ORDER — VITAMIN C 500 MG PO TABS
1000.0000 mg | ORAL_TABLET | Freq: Every day | ORAL | Status: DC
  Administered 2017-06-20 – 2017-06-22 (×3): 1000 mg via ORAL
  Filled 2017-06-18 (×3): qty 2

## 2017-06-18 MED ORDER — LACTATED RINGERS IV SOLN
INTRAVENOUS | Status: DC
  Administered 2017-06-19 – 2017-06-21 (×2): via INTRAVENOUS

## 2017-06-18 MED ORDER — HYDROMORPHONE HCL 2 MG/ML IJ SOLN: 0.5000 mg | INTRAMUSCULAR | Status: DC | PRN

## 2017-06-18 MED ORDER — FENTANYL CITRATE (PF) 100 MCG/2ML IJ SOLN
50.0000 ug | Freq: Once | INTRAMUSCULAR | Status: AC
  Administered 2017-06-18: 50 ug via INTRAVENOUS
  Filled 2017-06-18: qty 2

## 2017-06-18 MED ORDER — PROMETHAZINE HCL 12.5 MG RE SUPP
12.5000 mg | Freq: Four times a day (QID) | RECTAL | Status: DC | PRN
  Filled 2017-06-18: qty 1

## 2017-06-18 MED ORDER — ONDANSETRON HCL 4 MG/2ML IJ SOLN
4.0000 mg | Freq: Once | INTRAMUSCULAR | Status: AC
  Administered 2017-06-18: 4 mg via INTRAVENOUS
  Filled 2017-06-18: qty 2

## 2017-06-18 MED ORDER — ONDANSETRON HCL 4 MG/2ML IJ SOLN: 4.0000 mg | Freq: Four times a day (QID) | INTRAMUSCULAR | Status: DC | PRN

## 2017-06-18 MED ORDER — FAMOTIDINE 20 MG PO TABS: 20.0000 mg | ORAL_TABLET | Freq: Two times a day (BID) | ORAL | Status: DC | PRN

## 2017-06-18 NOTE — ED Provider Notes (Signed)
[facility]MENT Provider Note   CSN: [id number] Arrival date & time: 06/18/17  1757     History   Chief Complaint Chief Complaint  Patient presents with  . Fall    HPI Ryan Ward is a 72 y.o. male who presents to the ER after fall from a second story roof off of his ladder.  Patient seen and shared visit with Dr. Gerald Ward and made a level 2 trauma. He states that he was on the ladder when it slipped and he slid down his roof and hit the concrete driveway.  He had immediate severe pain in his right arm.  He has an abrasion to the head but denies loss of consciousness.  He also states that he had some pain in the right ankle which is much worse he tried to get up and he was unable to bear weight on his ankle.  Patient is unsure of his last tetanus vaccination.  HPI  Past Medical History:  Diagnosis Date  . Asthma    VERY MILD  . CAD (coronary artery disease)    a. 12/07/14 Inf STEMI/PCI: LAD 85p/Ward, 88m/d, LCX 80ost, RCA 30p/Ward, 41m/d, 100d (4.0x20 Promus Premier DES). Hosp course complicated by VF arrest, CGS, CHB req temp wire; b. 03/2015 Cath: LAD 85p/Ward, 95d, LCx 80ost, RCA 30-40p/Ward/d, RPDA 75, EF 30-35%, Nl CO; c. 04/06/2015 CABGx4 (LIMA->LAD, VG->Diag, Seq VG->prox RPDA->mid RPDA.  [name] Chronic systolic CHF (congestive heart failure) ([place])    a. 03/2015 Echo: EF 30-35%, diff HK.  [name] Hyperlipidemia   . Hypertensive heart disease   . Ischemic cardiomyopathy    a. 03/2015 Echo: EF 30-35%, diff HK, mild MR, mod dil RA, PASP 61mmHg.  . OSA (obstructive sleep apnea)    a. not using CPAP at home, does have machine  . Paroxysmal atrial fibrillation ([place])    a. 12/2014 In setting of Inf MI->convertred spont.  . Pulmonary embolism ([place])    a. 12/2014 R PT and peroneal vein DVT and RUL PE-->Xarelto x 3 mos.    Patient Active Problem List   Diagnosis Date Noted  . Sinus bradycardia 06/01/2015  . Chronic systolic CHF (congestive heart failure)  ([place])   . Ischemic cardiomyopathy   . S/P CABG x 4 04/06/2015  . CAD (coronary artery disease) 04/01/2015  . Chronic combined systolic and diastolic CHF (congestive heart failure) ([place]) 12/28/2014  . Hyperlipidemia 12/28/2014  . Pulmonary embolism without acute cor pulmonale ([place])   . Paroxysmal atrial fibrillation ([facility])   . Acute combined systolic and diastolic heart failure ([place])   . Hematuria 12/08/2014  . Ventricular fibrillation ([place]) 12/08/2014  . CAD S/P percutaneous coronary angioplasty 12/08/2014  . ST elevation (STEMI) myocardial infarction involving right coronary artery ([place]) 12/07/2014  . Complete heart block ([place]) 12/07/2014  . Cardiogenic shock ([place]) 12/07/2014  . HTN (hypertension) 12/07/2014    Past Surgical History:  Procedure Laterality Date  . CARDIAC CATHETERIZATION N/A 12/07/2014   Procedure: Coronary Stent Intervention;  Surgeon: Ryan Ward Martinique, Ryan Ward;  Location: [facility];  Service: Cardiovascular;  Laterality: N/A;  STEMI  . CARDIAC CATHETERIZATION N/A 12/07/2014   Procedure: IABP Insertion;  Surgeon: Ryan Ward Martinique, Ryan Ward;  Location: [facility];  Service: Cardiovascular;  Laterality: N/A;  . CARDIAC CATHETERIZATION N/A 12/07/2014   Procedure: Temporary Pacemaker;  Surgeon: Ryan Ward Martinique, Ryan Ward;  Location: [facility];  Service: Cardiovascular;  Laterality: N/A;  . CARDIAC CATHETERIZATION N/A 12/07/2014  Procedure: Left Heart Cath and Coronary Angiography;  Surgeon: Ryan Ward Martinique, Ryan Ward;  Location: [facility];  Service: Cardiovascular;  Laterality: N/A;  . CARDIAC CATHETERIZATION N/A 03/29/2015   Procedure: Right/Left Heart Cath and Coronary Angiography;  Surgeon: Ryan Ward Martinique, Ryan Ward;  Location: [facility];  Service: Cardiovascular;  Laterality: N/A;  . CORONARY ARTERY BYPASS GRAFT N/A 04/06/2015   Procedure: CORONARY ARTERY BYPASS GRAFT TIMES FOUR  USING LEFT INTERNAL MAMMARY ARTERY TO THE LEFT ANTERIOR DESCENDING, BILATERAL GREATER  SAPHENOUS ENDOVEIN HARVEST GRAFT TO PROXIMAL AND MID POSTERIOR DESCENDING AND DIAGONAL CORONARY ARTERIES AND PLACEMENT OR RIGHT FEMORAL A-LINE. ;  Surgeon: Ryan Isaac, Ryan Ward;  Location: [facility];  Service: Open Heart Surgery;  Laterality: N/A;  . HERNIA REPAIR    . TEE WITHOUT CARDIOVERSION N/A 04/06/2015   Procedure: TRANSESOPHAGEAL ECHOCARDIOGRAM (TEE);  Surgeon: Ryan Isaac, Ryan Ward;  Location: [facility];  Service: Open Heart Surgery;  Laterality: N/A;  . TONSILLECTOMY          Home Medications    Prior to Admission medications   Medication Sig Start Date End Date Taking? Authorizing Provider  aspirin EC 81 MG tablet Take 81 mg by mouth daily.   Yes Provider, Historical, Ryan Ward  atorvastatin (LIPITOR) 80 MG tablet TAKE 1 TABLET (80 MG TOTAL) BY MOUTH DAILY AT 6 PM. 09/18/16  Yes Ryan Latch, Ryan Ward  Cholecalciferol (VITAMIN D3) 5000 UNITS TABS Take 5,000 Units by mouth daily.    Yes Provider, Historical, Ryan Ward  famotidine (PEPCID) 20 MG tablet Take 20 mg by mouth 2 (two) times daily. Pt reports he takes prn depending on dietary intake   Yes Provider, Historical, Ryan Ward  furosemide (LASIX) 40 MG tablet TAKE 1 TABLET BY MOUTH EVERY DAY 04/02/17  Yes Ward, Ryan Ward, Ryan Ward  KLOR-CON M20 20 MEQ tablet TAKE 2 TABLETS BY MOUTH EVERY DAY 01/09/17  Yes Ward, Ryan Ward, Ryan Ward  levothyroxine (SYNTHROID, LEVOTHROID) 50 MCG tablet TAKE 1 TABLET (50 MCG TOTAL) BY MOUTH 2 (TWO) TIMES DAILY. 08/01/16  Yes Ward, Ryan Ward, Ryan Ward  losartan (COZAAR) 25 MG tablet Take 1 tablet (25 mg total) by mouth daily. 01/24/17  Yes Ward, Ryan Ward, Ryan Ward  Multiple Vitamin (MULTIVITAMIN WITH MINERALS) TABS tablet Take 1 tablet by mouth daily.   Yes Provider, Historical, Ryan Ward  PROAIR HFA 108 [phone] Base) MCG/ACT inhaler Inhale 2 puffs into the lungs every 6 (six) hours as needed for wheezing.  02/25/15  Yes Provider, Historical, Ryan Ward    Family History Family History  Problem Relation Age of Onset  . Heart attack Father     Social History Social History     Tobacco Use  . Smoking status: Never Smoker  . Smokeless tobacco: Never Used  Substance Use Topics  . Alcohol use: No    Alcohol/week: 0.0 oz  . Drug use: No     Allergies   Patient has no known allergies.   Review of Systems Review of Systems Ten systems reviewed and are negative for acute change, except as noted in the HPI.    Physical Exam Updated Vital Signs BP 122/67   Pulse 65   Temp 97.9 F (36.6 C) (Oral)   Resp 19   Ht 5\' 7"  (1.702 Ward)   Wt 90.7 kg (200 lb)   SpO2 98%   BMI 31.32 kg/Ward   Physical Exam  Constitutional: He is oriented to person, place, and time. Vital signs are normal. He appears well-developed and well-nourished. He is active.  No distress.  HENT:  Head: Normocephalic. Head is with abrasion (R temple).    Into the right temple, no evidence otherwise of facial trauma, no bruising or epistaxis.  Strong bite without tooth pain  Eyes: Pupils are equal, round, and reactive to light. Conjunctivae and EOM are normal.  Neck:  Patient without any midline spinal tenderness  Cardiovascular: Normal rate, regular rhythm and intact distal pulses.  Pulmonary/Chest: Effort normal and breath sounds normal.  No chest wall tenderness bruising or crepitus  Abdominal: Soft. Bowel sounds are normal. He exhibits no distension. There is no tenderness. There is no guarding.  Musculoskeletal:  Right arm with market swelling, pain with pronation supination of the elbow less pain with flexion extension.   Tenderness to palpation at the epicondyles. w.  Patient able to range the wrist and wiggle the fingers.  There is multiple abrasions to the forearm and hand, small skin tears.  Bruising over the fourth and fifth MCPs of the right hand.  Normal capillary refill, strong radial pulse.  No tenderness to palpation along the humerus.   Right ankle with significant swelling, bony crepitus noted with palpation of the medial malleolus DP and PT pulses.  Bilateral hip and knee  examination without significant pain.  Pelvis is stable against palpation. No midline spinal tenderness on examination with logroll or evidence of trauma to the posterior thorax   Neurological: He is alert and oriented to person, place, and time.  Skin: Skin is warm and dry. He is not diaphoretic.  Nursing note and vitals reviewed.    ED Treatments / Results  Labs (all labs ordered are listed, but only abnormal results are displayed) Labs Reviewed  COMPREHENSIVE METABOLIC PANEL  CBC WITH DIFFERENTIAL/PLATELET    EKG None  Radiology No results found.  Procedures Procedures (including critical care time)  Medications Ordered in ED Medications  Tdap (BOOSTRIX) injection 0.5 mL (has no administration in time range)  fentaNYL (SUBLIMAZE) injection 50 mcg (has no administration in time range)     Initial Impression / Assessment and Plan / ED Course  I have reviewed the triage vital signs and the nursing notes.  Pertinent labs & imaging results that were available during my care of the patient were reviewed by me and considered in my medical decision making (see chart for details).  Clinical Course as of Jun 18 2152  Tue Jun 18, 2017  2133 DG Hand Complete Right [AH]    Clinical Course User Index [AH] Margarita Mail, PA-C    Patient with fall from greater than height of 20 feet.  He has multiple fractures that will require surgical fixation.  Patient seen here in the emergency room by Dr. Amedeo Plenty who will admit the patient.  Plan for treatment by Ortho traumatologist Dr. Doreatha Martin tomorrow.  Patient updated and splint placed.  With Ortho tech and Dr. Amedeo Plenty at bedside.  Patient neurovascularly intact after splinting.  Pain controlled and patient will be admitted.  Final Clinical Impressions(s) / ED Diagnoses   Final diagnoses:  Fall on and from ladder causing accidental injury, initial encounter  Fall from roof as cause of accidental injury  Closed displaced fracture of  head of right radius, initial encounter  Closed displaced fracture of proximal phalanx of right little finger, initial encounter  Closed displaced fracture of medial malleolus of right tibia, initial encounter    ED Discharge Orders    None       Margarita Mail, PA-C 06/19/17 0003  Macarthur Critchley, [facility] 06/19/17 0005

## 2017-06-18 NOTE — ED Notes (Signed)
Ortho tech paged  

## 2017-06-18 NOTE — ED Triage Notes (Signed)
Pt here from home after falling 20 feet while working on his house on his ladder. Patient fell 10 feet, hit the first landing, and fell 10 more feet. Right arm is most affected by fall. Pt is not on blood thinners. Deformity noted to the right elbow by EMS. Pt c/o right arm pain and right ankle pain. Denies LOC, neck pain, back pain, and nausea/vomiting.

## 2017-06-18 NOTE — H&P (Signed)
Ryan Ward is an 72 y.o. male.   Chief Complaint: Status post fall 20 feet.  Patient has injuries to his right elbow with fracture subluxation dislocation and right fifth proximal phalanx of the hand about the small finger and a medial malleolus and posterior malleolus fracture displaced.  He has a history of coronary artery disease.  He was repaired 3 years ago as notes described.  He denies chest pain or other problems. HPI:  Status post fall 20 feet.  Patient has injuries to his right elbow with fracture subluxation dislocation and right fifth proximal phalanx of the hand about the small finger and a medial malleolus and posterior malleolus fracture displaced.  He has a history of coronary artery disease.  He was repaired 3 years ago as notes described.  He denies chest pain or other problems.  The trauma service has been called but did not feel this warranted a trauma admission.  I have discussed his care with general orthopedics.  I have reviewed his x-rays in detail.  I feel he is likely going to need a multi-trauma approach with fixation of his ankle elbow and hand as needed.  I discussed all issues with him and his wife.  He denies loss of consciousness.  He denies abdominal pain or chest pain at present time he denies neck or back pain. Past Medical History:  Diagnosis Date  . Asthma    VERY MILD  . CAD (coronary artery disease)    a. 12/07/14 Inf STEMI/PCI: LAD 85p/m, 82md, LCX 80ost, RCA 30p/m, 416m, 100d (4.0x20 Promus Premier DES). Hosp course complicated by VF arrest, CGS, CHB req temp wire; b. 03/2015 Cath: LAD 85p/m, 95d, LCx 80ost, RCA 30-40p/m/d, RPDA 75, EF 30-35%, Nl CO; c. 04/06/2015 CABGx4 (LIMA->LAD, VG->Diag, Seq VG->prox RPDA->mid RPDA.  . [name] Kitchenhronic systolic CHF (congestive heart failure) ([place]   a. 03/2015 Echo: EF 30-35%, diff HK.  . [name] Kitchenyperlipidemia   . Hypertensive heart disease   . Ischemic cardiomyopathy    a. 03/2015 Echo: EF 30-35%, diff HK, mild MR, mod dil RA,  PASP 4996m.  . OSA (obstructive sleep apnea)    a. not using CPAP at home, does have machine  . Paroxysmal atrial fibrillation ([place]  a. 12/2014 In setting of Inf MI->convertred spont.  . Pulmonary embolism ([place]  a. 12/2014 R PT and peroneal vein DVT and RUL PE-->Xarelto x 3 mos.    Past Surgical History:  Procedure Laterality Date  . CARDIAC CATHETERIZATION N/A 12/07/2014   Procedure: Coronary Stent Intervention;  Surgeon: Peter M JorMartiniqueD;  Location: [facility];  Service: Cardiovascular;  Laterality: N/A;  STEMI  . CARDIAC CATHETERIZATION N/A 12/07/2014   Procedure: IABP Insertion;  Surgeon: Peter M JorMartiniqueD;  Location: [facility];  Service: Cardiovascular;  Laterality: N/A;  . CARDIAC CATHETERIZATION N/A 12/07/2014   Procedure: Temporary Pacemaker;  Surgeon: Peter M JorMartiniqueD;  Location: [facility];  Service: Cardiovascular;  Laterality: N/A;  . CARDIAC CATHETERIZATION N/A 12/07/2014   Procedure: Left Heart Cath and Coronary Angiography;  Surgeon: Peter M JorMartiniqueD;  Location: [facility];  Service: Cardiovascular;  Laterality: N/A;  . CARDIAC CATHETERIZATION N/A 03/29/2015   Procedure: Right/Left Heart Cath and Coronary Angiography;  Surgeon: Peter M JorMartiniqueD;  Location: [facility];  Service: Cardiovascular;  Laterality: N/A;  . CORONARY ARTERY BYPASS GRAFT N/A 04/06/2015   Procedure: CORONARY ARTERY BYPASS GRAFT TIMES FOUR  USING  LEFT INTERNAL MAMMARY ARTERY TO THE LEFT ANTERIOR DESCENDING, BILATERAL GREATER SAPHENOUS ENDOVEIN HARVEST GRAFT TO PROXIMAL AND MID POSTERIOR DESCENDING AND DIAGONAL CORONARY ARTERIES AND PLACEMENT OR RIGHT FEMORAL A-LINE. ;  Surgeon: [name], MD;  Location: [facility];  Service: Open Heart Surgery;  Laterality: N/A;  . HERNIA REPAIR    . TEE WITHOUT CARDIOVERSION N/A 04/06/2015   Procedure: TRANSESOPHAGEAL ECHOCARDIOGRAM (TEE);  Surgeon: [name], MD;  Location: [facility];  Service: Open Heart Surgery;   Laterality: N/A;  . TONSILLECTOMY      Family History  Problem Relation Age of Onset  . Heart attack Father    Social History:  reports that he has never smoked. He has never used smokeless tobacco. He reports that he does not drink alcohol or use drugs.  Allergies: No Known Allergies   (Not in a hospital admission)  Results for orders placed or performed during the hospital encounter of 06/18/17 (from the past 48 hour(s))  Comprehensive metabolic panel     Status: Abnormal   Collection Time: 06/18/17  7:03 PM  Result Value Ref Range   Sodium 142 135 - 145 mmol/L   Potassium 3.9 3.5 - 5.1 mmol/L   Chloride 105 101 - 111 mmol/L   CO2 28 22 - 32 mmol/L   Glucose, Bld 131 (H) 65 - 99 mg/dL   BUN 18 6 - 20 mg/dL   Creatinine, Ser 1.25 (H) 0.61 - 1.24 mg/dL   Calcium 9.1 8.9 - 10.3 mg/dL   Total Protein 6.0 (L) 6.5 - 8.1 g/dL   Albumin 3.9 3.5 - 5.0 g/dL   AST 35 15 - 41 U/L   ALT 35 17 - 63 U/L   Alkaline Phosphatase 81 38 - 126 U/L   Total Bilirubin 1.0 0.3 - 1.2 mg/dL   GFR calc non Af Amer 56 (L) >60 mL/min   GFR calc Af Amer >60 >60 mL/min    Comment: (NOTE) The eGFR has been calculated using the CKD EPI equation. This calculation has not been validated in all clinical situations. eGFR's persistently <60 mL/min signify possible Chronic Kidney Disease.    Anion gap 9 5 - 15    Comment: Performed at [facility] [address] [place], [place] [address]  CBC with Differential/Platelet     Status: Abnormal   Collection Time: 06/18/17  7:03 PM  Result Value Ref Range   WBC 15.9 (H) 4.0 - 10.5 K/uL   RBC 4.73 4.22 - 5.81 MIL/uL   Hemoglobin 14.3 13.0 - 17.0 g/dL   HCT 44.0 39.0 - 52.0 %   MCV 93.0 78.0 - 100.0 fL   MCH 30.2 26.0 - 34.0 pg   MCHC 32.5 30.0 - 36.0 g/dL   RDW 13.8 11.5 - 15.5 %   Platelets 194 150 - 400 K/uL   Neutrophils Relative % 82 %   Neutro Abs 13.0 (H) 1.7 - 7.7 K/uL   Lymphocytes Relative 9 %   Lymphs Abs 1.4 0.7 - 4.0 K/uL    Monocytes Relative 7 %   Monocytes Absolute 1.1 (H) 0.1 - 1.0 K/uL   Eosinophils Relative 1 %   Eosinophils Absolute 0.2 0.0 - 0.7 K/uL   Basophils Relative 1 %   Basophils Absolute 0.1 0.0 - 0.1 K/uL   Immature Granulocytes 0 %   Abs Immature Granulocytes 0.1 0.0 - 0.1 K/uL    Comment: Performed at [facility], [address], [place], [place] [address]  Dg Elbow Complete Right  Result Date: 06/18/2017 CLINICAL DATA:  Acute RIGHT elbow pain following fall. Initial encounter. EXAM: RIGHT ELBOW - COMPLETE 3+ VIEW COMPARISON:  None. FINDINGS: A impacted comminuted radial head/neck fracture is noted. A bony fragment overlying the joint and probable bony fragment anterior to the distal humerus on the LATERAL view are of uncertain donor sites. Soft tissue swelling is present. No dislocation. IMPRESSION: Impacted comminuted radial head/neck fracture. Bony fragments overlying the joint and anterior to the distal humerus on the LATERAL view of uncertain donor sites. Soft tissue swelling. Electronically Signed   By: Margarette Canada M.D.   On: 06/18/2017 19:10   Dg Forearm Right  Result Date: 06/18/2017 CLINICAL DATA:  Acute RIGHT forearm pain following fall. Initial encounter. Is EXAM: RIGHT FOREARM - 2 VIEW COMPARISON:  None. FINDINGS: A fracture of the radial head and neck noted. Bony densities overlying the joint and anterior to the distal humerus on the LATERAL view are of uncertain donor origin. Soft tissue swelling is present. IMPRESSION: Radial neck and head fracture. Bony densities overlying the joint and anterior to the distal humerus on the LATERAL view - from uncertain donor sites. Electronically Signed   By: Margarette Canada M.D.   On: 06/18/2017 19:17   Dg Ankle Complete Right  Result Date: 06/18/2017 CLINICAL DATA:  Acute ankle pain following fall.  Initial encounter. EXAM: RIGHT ANKLE - COMPLETE 3+ VIEW COMPARISON:  None. FINDINGS: A transverse fracture of the medial malleolus is noted with  3 mm distraction. A posterior malleolar/tibial fracture is present. Ankle effusion and soft tissue swelling noted. No dislocation. IMPRESSION: Medial and posterior malleolar tibial fractures Electronically Signed   By: Margarette Canada M.D.   On: 06/18/2017 19:07   Ct Head Wo Contrast  Result Date: 06/18/2017 CLINICAL DATA:  Status post fall through roof onto a concrete floor today. Initial encounter. EXAM: CT HEAD WITHOUT CONTRAST CT CERVICAL SPINE WITHOUT CONTRAST TECHNIQUE: Multidetector CT imaging of the head and cervical spine was performed following the standard protocol without intravenous contrast. Multiplanar CT image reconstructions of the cervical spine were also generated. COMPARISON:  None. FINDINGS: CT HEAD FINDINGS Brain: No evidence of acute infarction, hemorrhage, hydrocephalus, extra-axial collection or mass lesion/mass effect. Vascular: Atherosclerosis noted. Skull: Intact. Sinuses/Orbits: Scattered ethmoid air cell disease is noted. There is mucosal thickening in the inferior aspect of both the right and left maxillary sinuses. Other: None. CT CERVICAL SPINE FINDINGS Alignment: Maintained. Skull base and vertebrae: No acute fracture. No primary bone lesion or focal pathologic process. Soft tissues and spinal canal: No prevertebral fluid or swelling. No visible canal hematoma. Disc levels: Severe loss of disc space height and endplate spurring are seen at C5-6 and C6-7. Scattered facet degenerative disease appears worst on the right at C3-4 and C4-5. Upper chest: Clear. Other: None. IMPRESSION: No acute abnormality head or cervical spine. Atherosclerosis. Cervical spondylosis appearing worst at C5-6 and C6-7. Sinus disease. Electronically Signed   By: Inge Rise M.D.   On: 06/18/2017 19:57   Ct Cervical Spine Wo Contrast  Result Date: 06/18/2017 CLINICAL DATA:  Status post fall through roof onto a concrete floor today. Initial encounter. EXAM: CT HEAD WITHOUT CONTRAST CT CERVICAL SPINE  WITHOUT CONTRAST TECHNIQUE: Multidetector CT imaging of the head and cervical spine was performed following the standard protocol without intravenous contrast. Multiplanar CT image reconstructions of the cervical spine were also generated. COMPARISON:  None. FINDINGS: CT HEAD FINDINGS Brain: No evidence of acute infarction, hemorrhage, hydrocephalus,  extra-axial collection or mass lesion/mass effect. Vascular: Atherosclerosis noted. Skull: Intact. Sinuses/Orbits: Scattered ethmoid air cell disease is noted. There is mucosal thickening in the inferior aspect of both the right and left maxillary sinuses. Other: None. CT CERVICAL SPINE FINDINGS Alignment: Maintained. Skull base and vertebrae: No acute fracture. No primary bone lesion or focal pathologic process. Soft tissues and spinal canal: No prevertebral fluid or swelling. No visible canal hematoma. Disc levels: Severe loss of disc space height and endplate spurring are seen at C5-6 and C6-7. Scattered facet degenerative disease appears worst on the right at C3-4 and C4-5. Upper chest: Clear. Other: None. IMPRESSION: No acute abnormality head or cervical spine. Atherosclerosis. Cervical spondylosis appearing worst at C5-6 and C6-7. Sinus disease. Electronically Signed   By: Inge Rise M.D.   On: 06/18/2017 19:57   Dg Pelvis Portable  Result Date: 06/18/2017 CLINICAL DATA:  Acute pelvic pain following fall. Initial encounter. EXAM: PORTABLE PELVIS 1-2 VIEWS COMPARISON:  None. FINDINGS: There is no evidence of pelvic fracture or diastasis. No pelvic bone lesions are seen. IMPRESSION: Negative. Electronically Signed   By: Margarette Canada M.D.   On: 06/18/2017 19:13   Dg Chest Portable 1 View  Result Date: 06/18/2017 CLINICAL DATA:  Fall from 20 feet. EXAM: PORTABLE CHEST 1 VIEW COMPARISON:  06/16/2015 and prior exams FINDINGS: Cardiomegaly and CABG changes noted. There is no evidence of focal airspace disease, pulmonary edema, suspicious pulmonary  nodule/mass, pleural effusion, or pneumothorax. No acute bony abnormalities are identified. IMPRESSION: Cardiomegaly without evidence of acute cardiopulmonary disease. Electronically Signed   By: Margarette Canada M.D.   On: 06/18/2017 19:11   Dg Hand Complete Right  Result Date: 06/18/2017 CLINICAL DATA:  Acute RIGHT hand pain following fall. Initial encounter. EXAM: RIGHT HAND - COMPLETE 3+ VIEW COMPARISON:  None. FINDINGS: A mildly comminuted fracture of the proximal aspect of the little finger proximal phalanx is noted. No other fracture, subluxation or dislocation. Soft tissue swelling is present. IMPRESSION: Mildly comminuted fracture of the little finger proximal phalanx. Electronically Signed   By: Margarette Canada M.D.   On: 06/18/2017 19:15    Review of Systems  Respiratory: Negative.   Cardiovascular: Negative.   Gastrointestinal: Negative.   Genitourinary: Negative.   Neurological: Negative.   Psychiatric/Behavioral: Negative.     Blood pressure 109/64, pulse 65, temperature 97.9 F (36.6 C), temperature source Oral, resp. rate 17, height 5' 7"  (1.702 m), weight 90.7 kg (200 lb), SpO2 94 %. Physical Exam  Patient is alert and oriented.  HEENT is within normal limits.  Chest is clear abdomen is nontender.  He has normal heart rate.  Neck and back are nontender.  Left upper extremity is stable ligamentous examination he has IV access and some abrasions.  His right elbow is examined.  Unfortunately his elbow is in an extended position and is now dislocated.  He has fracture about his fifth proximal phalanx which is mildly tender.  He is sensate.  He has abrasions throughout his arm.  I have gone ahead and cleansed the abrasions with soap and water followed by Adaptic and Neosporin and Xeroform preparation.  Following this we performed a reduction of the elbow.  Procedure patient underwent a careful reduction of the elbow performed by myself without difficulty.  I was able to get him into place  nicely and verify this under AP and lateral imagery.  Fluoroscopic examination at bedside revealed this and I have save the films for his review and my review  for documentation.  Following this we placed him in a long-arm splint with stirrups.  He was neurovascularly intact following the reduction.  This was a closed reduction of an elbow dislocation.  I feel he has a terrible triad fracture with coronoid radial head and lateral ulnar collateral ligament injury.  I reviewed his films and do feel this will require definitive fixation.  Patient's ankle is closed but swollen he is intact sensation has no evidence of compartment syndrome.  We placed him in a short splint in terms of a short leg splint.  He has ample room for swelling.  He can perform a straight leg raise.  He is intact to sensation.  His left leg is stable.  I reviewed this with him at length and the findings.  At present time there is no evidence of DVT compartment syndrome or other abnormality.  The abrasions on the right arm have some significance in my opinion but can be avoided with incisions for surgical intervention. Assessment/Plan Status post fall 20 feet with multiple orthopedic injuries.  #1 right elbow fracture dislocation terrible triad in nature #2 right fifth proximal phalanx fracture about the hand #3 closed medial and posterior malleolar fracture about the ankle  We performed a closed reduction of his elbow tonight.  We will obtain CT scans of the elbow and ankle.  Will consult Dr. Doreatha Martin in the morning for discussion of definitive surgical care.  We appreciate Dr. Doreatha Martin being able to review this.  I discussed this with Dr. Lorin Mercy who is on-call for general surgery.  I performed the reduction of his elbow tonight.  I am going to be going out of town tomorrow and will confer with Dr. Doreatha Martin.  I discussed with patient all issues.  I will keep him n.p.o. after 2 AM  Paulene Floor, MD 06/18/2017, 10:51  PM

## 2017-06-18 NOTE — Progress Notes (Signed)
Orthopedic Tech Progress Note Patient Details:  Ryan Ward 1945-02-06 [id number]  Ortho Devices Type of Ortho Device: Ace wrap, Post (long arm) splint, Post (short leg) splint Ortho Device/Splint Location: RUE, RLE Ortho Device/Splint Interventions: Ordered, Application   Post Interventions Patient Tolerated: Well Instructions Provided: Care of device   Braulio Bosch 06/18/2017, 10:48 PM

## 2017-06-18 NOTE — ED Notes (Signed)
ED Provider at bedside. 

## 2017-06-18 NOTE — Progress Notes (Signed)
Appreciate Dr. Veronia Beets seeing pt and tomorrow arranging surgical care for pt.           Discussed transfer of care for pt with wife at bedside for surgical Tx with Ortho Trauma team tomorrow.      Lorin Mercy  [phone]

## 2017-06-18 NOTE — ED Notes (Signed)
Patient transported to CT 

## 2017-06-19 ENCOUNTER — Inpatient Hospital Stay (HOSPITAL_COMMUNITY): Payer: Medicare Other | Admitting: Anesthesiology

## 2017-06-19 ENCOUNTER — Encounter (HOSPITAL_COMMUNITY): Payer: Self-pay | Admitting: *Deleted

## 2017-06-19 ENCOUNTER — Inpatient Hospital Stay (HOSPITAL_COMMUNITY): Payer: Medicare Other

## 2017-06-19 ENCOUNTER — Encounter (HOSPITAL_COMMUNITY)
Admission: EM | Disposition: A | Discharge status: Home Health | Payer: Self-pay | Source: Home / Self Care | Attending: Student

## 2017-06-19 ENCOUNTER — Other Ambulatory Visit: Payer: Self-pay

## 2017-06-19 HISTORY — PX: ORIF ANKLE FRACTURE: SHX5408

## 2017-06-19 HISTORY — PX: ORIF ELBOW FRACTURE: SHX5031

## 2017-06-19 HISTORY — PX: OPEN REDUCTION INTERNAL FIXATION (ORIF) PROXIMAL PHALANX: SHX6235

## 2017-06-19 SURGERY — OPEN REDUCTION INTERNAL FIXATION (ORIF) ELBOW/OLECRANON FRACTURE
Anesthesia: General | Site: Finger | Laterality: Right

## 2017-06-19 MED ORDER — LEVOTHYROXINE SODIUM 50 MCG PO TABS
50.0000 ug | ORAL_TABLET | Freq: Two times a day (BID) | ORAL | Status: DC
  Administered 2017-06-20 – 2017-06-22 (×5): 50 ug via ORAL
  Filled 2017-06-19 (×5): qty 1

## 2017-06-19 MED ORDER — ATORVASTATIN CALCIUM 80 MG PO TABS
80.0000 mg | ORAL_TABLET | Freq: Every day | ORAL | Status: DC
  Administered 2017-06-20 – 2017-06-21 (×2): 80 mg via ORAL
  Filled 2017-06-19 (×2): qty 1

## 2017-06-19 MED ORDER — ALBUTEROL SULFATE (2.5 MG/3ML) 0.083% IN NEBU: 2.5000 mg | INHALATION_SOLUTION | Freq: Four times a day (QID) | RESPIRATORY_TRACT | Status: DC | PRN

## 2017-06-19 MED ORDER — ACETAMINOPHEN 500 MG PO TABS
500.0000 mg | ORAL_TABLET | Freq: Two times a day (BID) | ORAL | Status: DC
  Administered 2017-06-19 – 2017-06-22 (×6): 500 mg via ORAL
  Filled 2017-06-19 (×6): qty 1

## 2017-06-19 MED ORDER — LACTATED RINGERS IV SOLN
INTRAVENOUS | Status: DC | PRN
  Administered 2017-06-19 (×3): via INTRAVENOUS

## 2017-06-19 MED ORDER — LIDOCAINE HCL (CARDIAC) PF 100 MG/5ML IV SOSY
PREFILLED_SYRINGE | INTRAVENOUS | Status: DC | PRN
  Administered 2017-06-19: 100 mg via INTRAVENOUS

## 2017-06-19 MED ORDER — ONDANSETRON HCL 4 MG/2ML IJ SOLN
INTRAMUSCULAR | Status: AC
  Filled 2017-06-19: qty 4

## 2017-06-19 MED ORDER — ROCURONIUM BROMIDE 50 MG/5ML IV SOLN
INTRAVENOUS | Status: AC
  Filled 2017-06-19: qty 1

## 2017-06-19 MED ORDER — LACTATED RINGERS IV SOLN
INTRAVENOUS | Status: DC
  Administered 2017-06-19: 10:00:00 via INTRAVENOUS

## 2017-06-19 MED ORDER — 0.9 % SODIUM CHLORIDE (POUR BTL) OPTIME
TOPICAL | Status: DC | PRN
  Administered 2017-06-19 (×2): 1000 mL

## 2017-06-19 MED ORDER — FENTANYL CITRATE (PF) 100 MCG/2ML IJ SOLN
INTRAMUSCULAR | Status: AC
  Filled 2017-06-19: qty 2

## 2017-06-19 MED ORDER — EPHEDRINE SULFATE 50 MG/ML IJ SOLN
INTRAMUSCULAR | Status: DC | PRN
  Administered 2017-06-19 (×2): 10 mg via INTRAVENOUS

## 2017-06-19 MED ORDER — ASPIRIN EC 81 MG PO TBEC
81.0000 mg | DELAYED_RELEASE_TABLET | Freq: Every day | ORAL | Status: DC
  Administered 2017-06-20 – 2017-06-22 (×3): 81 mg via ORAL
  Filled 2017-06-19 (×3): qty 1

## 2017-06-19 MED ORDER — OXYCODONE-ACETAMINOPHEN 5-325 MG PO TABS
2.0000 | ORAL_TABLET | Freq: Four times a day (QID) | ORAL | Status: DC | PRN
  Administered 2017-06-19 – 2017-06-21 (×2): 2 via ORAL
  Filled 2017-06-19 (×2): qty 2

## 2017-06-19 MED ORDER — VANCOMYCIN HCL 1000 MG IV SOLR
INTRAVENOUS | Status: AC
  Filled 2017-06-19: qty 1000

## 2017-06-19 MED ORDER — OXYCODONE-ACETAMINOPHEN 5-325 MG PO TABS
1.0000 | ORAL_TABLET | ORAL | Status: DC | PRN
  Administered 2017-06-22: 1 via ORAL
  Filled 2017-06-19: qty 1

## 2017-06-19 MED ORDER — BACITRACIN 500 UNIT/GM EX OINT
TOPICAL_OINTMENT | CUTANEOUS | Status: DC | PRN
  Administered 2017-06-19: 1 via TOPICAL

## 2017-06-19 MED ORDER — LOSARTAN POTASSIUM 25 MG PO TABS
25.0000 mg | ORAL_TABLET | Freq: Every day | ORAL | Status: DC
  Administered 2017-06-20 – 2017-06-22 (×3): 25 mg via ORAL
  Filled 2017-06-19 (×3): qty 1

## 2017-06-19 MED ORDER — ROCURONIUM BROMIDE 50 MG/5ML IV SOLN
INTRAVENOUS | Status: AC
  Filled 2017-06-19: qty 3

## 2017-06-19 MED ORDER — PHENYLEPHRINE HCL 10 MG/ML IJ SOLN
INTRAMUSCULAR | Status: AC
  Filled 2017-06-19: qty 1

## 2017-06-19 MED ORDER — DEXAMETHASONE SODIUM PHOSPHATE 10 MG/ML IJ SOLN
INTRAMUSCULAR | Status: AC
  Filled 2017-06-19: qty 2

## 2017-06-19 MED ORDER — ACETAMINOPHEN 500 MG PO TABS: 500.0000 mg | ORAL_TABLET | Freq: Two times a day (BID) | ORAL | Status: DC

## 2017-06-19 MED ORDER — LACTATED RINGERS IV SOLN
INTRAVENOUS | Status: DC
  Administered 2017-06-19: 21:00:00 via INTRAVENOUS

## 2017-06-19 MED ORDER — FENTANYL CITRATE (PF) 100 MCG/2ML IJ SOLN
INTRAMUSCULAR | Status: DC | PRN
  Administered 2017-06-19: 50 ug via INTRAVENOUS
  Administered 2017-06-19: 25 ug via INTRAVENOUS
  Administered 2017-06-19: 125 ug via INTRAVENOUS
  Administered 2017-06-19 (×3): 50 ug via INTRAVENOUS

## 2017-06-19 MED ORDER — SUGAMMADEX SODIUM 500 MG/5ML IV SOLN
INTRAVENOUS | Status: DC | PRN
  Administered 2017-06-19: 180 mg via INTRAVENOUS

## 2017-06-19 MED ORDER — PROPOFOL 10 MG/ML IV BOLUS
INTRAVENOUS | Status: DC | PRN
  Administered 2017-06-19: 150 mg via INTRAVENOUS

## 2017-06-19 MED ORDER — MEPERIDINE HCL 50 MG/ML IJ SOLN: 6.2500 mg | INTRAMUSCULAR | Status: DC | PRN

## 2017-06-19 MED ORDER — PROPOFOL 10 MG/ML IV BOLUS
INTRAVENOUS | Status: AC
  Filled 2017-06-19: qty 20

## 2017-06-19 MED ORDER — DEXAMETHASONE SODIUM PHOSPHATE 10 MG/ML IJ SOLN
INTRAMUSCULAR | Status: DC | PRN
  Administered 2017-06-19: 10 mg via INTRAVENOUS

## 2017-06-19 MED ORDER — CEFAZOLIN SODIUM-DEXTROSE 2-4 GM/100ML-% IV SOLN
2.0000 g | Freq: Three times a day (TID) | INTRAVENOUS | Status: AC
  Administered 2017-06-19 – 2017-06-20 (×3): 2 g via INTRAVENOUS
  Filled 2017-06-19 (×3): qty 100

## 2017-06-19 MED ORDER — FENTANYL CITRATE (PF) 250 MCG/5ML IJ SOLN
INTRAMUSCULAR | Status: AC
  Filled 2017-06-19: qty 5

## 2017-06-19 MED ORDER — LIDOCAINE 2% (20 MG/ML) 5 ML SYRINGE
INTRAMUSCULAR | Status: AC
  Filled 2017-06-19: qty 10

## 2017-06-19 MED ORDER — POTASSIUM CHLORIDE CRYS ER 20 MEQ PO TBCR
40.0000 meq | EXTENDED_RELEASE_TABLET | Freq: Every day | ORAL | Status: DC
  Administered 2017-06-19 – 2017-06-22 (×4): 40 meq via ORAL
  Filled 2017-06-19 (×4): qty 2

## 2017-06-19 MED ORDER — VANCOMYCIN HCL 1000 MG IV SOLR
INTRAVENOUS | Status: DC | PRN
  Administered 2017-06-19 (×2): 1000 mg

## 2017-06-19 MED ORDER — BUPIVACAINE HCL (PF) 0.5 % IJ SOLN
INTRAMUSCULAR | Status: AC
  Filled 2017-06-19: qty 30

## 2017-06-19 MED ORDER — HYDROMORPHONE HCL 2 MG/ML IJ SOLN: 1.0000 mg | INTRAMUSCULAR | Status: DC | PRN

## 2017-06-19 MED ORDER — PHENYLEPHRINE HCL 10 MG/ML IJ SOLN
INTRAMUSCULAR | Status: DC | PRN
  Administered 2017-06-19: 80 ug via INTRAVENOUS

## 2017-06-19 MED ORDER — KETOROLAC TROMETHAMINE 15 MG/ML IJ SOLN
15.0000 mg | Freq: Four times a day (QID) | INTRAMUSCULAR | Status: AC
  Administered 2017-06-19 – 2017-06-20 (×4): 15 mg via INTRAVENOUS
  Filled 2017-06-19 (×4): qty 1

## 2017-06-19 MED ORDER — METOCLOPRAMIDE HCL 5 MG/ML IJ SOLN: 10.0000 mg | Freq: Once | INTRAMUSCULAR | Status: DC | PRN

## 2017-06-19 MED ORDER — EPHEDRINE SULFATE 50 MG/ML IJ SOLN
INTRAMUSCULAR | Status: AC
  Filled 2017-06-19: qty 1

## 2017-06-19 MED ORDER — SUGAMMADEX SODIUM 200 MG/2ML IV SOLN
INTRAVENOUS | Status: AC
  Filled 2017-06-19: qty 4

## 2017-06-19 MED ORDER — CEFAZOLIN SODIUM 1 G IJ SOLR
INTRAMUSCULAR | Status: AC
  Filled 2017-06-19: qty 10

## 2017-06-19 MED ORDER — FENTANYL CITRATE (PF) 100 MCG/2ML IJ SOLN
25.0000 ug | INTRAMUSCULAR | Status: DC | PRN
  Administered 2017-06-19: 50 ug via INTRAVENOUS

## 2017-06-19 MED ORDER — BUPIVACAINE HCL 0.5 % IJ SOLN
INTRAMUSCULAR | Status: DC | PRN
  Administered 2017-06-19: 10 mL

## 2017-06-19 MED ORDER — PHENYLEPHRINE HCL 10 MG/ML IJ SOLN
INTRAVENOUS | Status: DC | PRN
  Administered 2017-06-19: 20 ug/min via INTRAVENOUS

## 2017-06-19 MED ORDER — SODIUM CHLORIDE 0.9 % IJ SOLN
INTRAMUSCULAR | Status: AC
  Filled 2017-06-19: qty 10

## 2017-06-19 MED ORDER — FUROSEMIDE 40 MG PO TABS
40.0000 mg | ORAL_TABLET | Freq: Every day | ORAL | Status: DC
  Administered 2017-06-19 – 2017-06-22 (×4): 40 mg via ORAL
  Filled 2017-06-19 (×4): qty 1

## 2017-06-19 MED ORDER — ROCURONIUM BROMIDE 100 MG/10ML IV SOLN
INTRAVENOUS | Status: DC | PRN
  Administered 2017-06-19 (×2): 10 mg via INTRAVENOUS
  Administered 2017-06-19: 50 mg via INTRAVENOUS
  Administered 2017-06-19: 10 mg via INTRAVENOUS
  Administered 2017-06-19: 20 mg via INTRAVENOUS
  Administered 2017-06-19: 10 mg via INTRAVENOUS
  Administered 2017-06-19: 20 mg via INTRAVENOUS
  Administered 2017-06-19 (×3): 10 mg via INTRAVENOUS

## 2017-06-19 MED ORDER — ONDANSETRON HCL 4 MG/2ML IJ SOLN
INTRAMUSCULAR | Status: DC | PRN
  Administered 2017-06-19 (×2): 4 mg via INTRAVENOUS

## 2017-06-19 MED ORDER — FAMOTIDINE 20 MG PO TABS
20.0000 mg | ORAL_TABLET | Freq: Two times a day (BID) | ORAL | Status: DC
  Administered 2017-06-19 – 2017-06-22 (×6): 20 mg via ORAL
  Filled 2017-06-19 (×6): qty 1

## 2017-06-19 SURGICAL SUPPLY — 133 items
ANCHOR SUT 1.45 SZ 1 SHORT (Anchor) ×4 IMPLANT
BANDAGE ACE 3X5.8 VEL STRL LF (GAUZE/BANDAGES/DRESSINGS) ×4 IMPLANT
BANDAGE ACE 4X5 VEL STRL LF (GAUZE/BANDAGES/DRESSINGS) ×4 IMPLANT
BANDAGE ACE 6X5 VEL STRL LF (GAUZE/BANDAGES/DRESSINGS) ×4 IMPLANT
BANDAGE ESMARK 6X9 LF (GAUZE/BANDAGES/DRESSINGS) ×3 IMPLANT
BENZOIN TINCTURE PRP APPL 2/3 (GAUZE/BANDAGES/DRESSINGS) IMPLANT
BIT DRILL 1.1 (BIT) ×1
BIT DRILL 2.5 X LONG (BIT) ×3
BIT DRILL 2.5X110 QC LCP DISP (BIT) ×4 IMPLANT
BIT DRILL 60X20X1.1XQC TMX (BIT) ×3 IMPLANT
BIT DRILL CONN W/AO 2.0X85 (BIT) ×4 IMPLANT
BIT DRILL QC 3.5X110 (BIT) ×4 IMPLANT
BIT DRILL X LONG 2.5 (BIT) ×3 IMPLANT
BIT DRL 60X20X1.1XQC TMX (BIT) ×3
BLADE AVERAGE 25X9 (BLADE) ×8 IMPLANT
BLADE CLIPPER SURG (BLADE) ×4 IMPLANT
BLADE SURG 10 STRL SS (BLADE) IMPLANT
BNDG COHESIVE 4X5 TAN STRL (GAUZE/BANDAGES/DRESSINGS) ×4 IMPLANT
BNDG ESMARK 4X9 LF (GAUZE/BANDAGES/DRESSINGS) ×4 IMPLANT
BNDG ESMARK 6X9 LF (GAUZE/BANDAGES/DRESSINGS) ×4
BNDG GAUZE ELAST 4 BULKY (GAUZE/BANDAGES/DRESSINGS) ×4 IMPLANT
BRUSH SCRUB SURG 4.25 DISP (MISCELLANEOUS) ×8 IMPLANT
CHLORAPREP W/TINT 26ML (MISCELLANEOUS) ×12 IMPLANT
CLEANER TIP ELECTROSURG 2X2 (MISCELLANEOUS) ×4 IMPLANT
COVER MAYO STAND STRL (DRAPES) ×4 IMPLANT
COVER SURGICAL LIGHT HANDLE (MISCELLANEOUS) ×16 IMPLANT
CUFF TOURNIQUET SINGLE 18IN (TOURNIQUET CUFF) ×4 IMPLANT
CUFF TOURNIQUET SINGLE 24IN (TOURNIQUET CUFF) IMPLANT
CUFF TOURNIQUET SINGLE 34IN LL (TOURNIQUET CUFF) ×4 IMPLANT
DECANTER SPIKE VIAL GLASS SM (MISCELLANEOUS) ×4 IMPLANT
DRAPE C-ARM 42X72 X-RAY (DRAPES) ×4 IMPLANT
DRAPE C-ARMOR (DRAPES) ×4 IMPLANT
DRAPE HALF SHEET 40X57 (DRAPES) ×8 IMPLANT
DRAPE IMP U-DRAPE 54X76 (DRAPES) ×8 IMPLANT
DRAPE INCISE IOBAN 66X45 STRL (DRAPES) IMPLANT
DRAPE ORTHO SPLIT 77X108 STRL (DRAPES) ×2
DRAPE SURG ORHT 6 SPLT 77X108 (DRAPES) ×6 IMPLANT
DRAPE U-SHAPE 47X51 STRL (DRAPES) ×8 IMPLANT
DRILL BIT X LONG 2.5 (BIT) ×1
DRIVER BIT 1.5 (TRAUMA) ×8 IMPLANT
DRSG ADAPTIC 3X8 NADH LF (GAUZE/BANDAGES/DRESSINGS) ×8 IMPLANT
DRSG EMULSION OIL 3X3 NADH (GAUZE/BANDAGES/DRESSINGS) ×4 IMPLANT
ELECT REM PT RETURN 9FT ADLT (ELECTROSURGICAL) ×4
ELECTRODE REM PT RTRN 9FT ADLT (ELECTROSURGICAL) ×3 IMPLANT
FACESHIELD WRAPAROUND (MASK) IMPLANT
GAUZE SPONGE 4X4 12PLY STRL (GAUZE/BANDAGES/DRESSINGS) ×12 IMPLANT
GAUZE XEROFORM 1X8 LF (GAUZE/BANDAGES/DRESSINGS) ×4 IMPLANT
GAUZE XEROFORM 5X9 LF (GAUZE/BANDAGES/DRESSINGS) ×4 IMPLANT
GLOVE BIO SURGEON STRL SZ7.5 (GLOVE) ×28 IMPLANT
GLOVE BIO SURGEON STRL SZ8 (GLOVE) ×4 IMPLANT
GLOVE BIOGEL PI IND STRL 6.5 (GLOVE) ×3 IMPLANT
GLOVE BIOGEL PI IND STRL 7.5 (GLOVE) ×3 IMPLANT
GLOVE BIOGEL PI IND STRL 8 (GLOVE) ×3 IMPLANT
GLOVE BIOGEL PI INDICATOR 6.5 (GLOVE) ×1
GLOVE BIOGEL PI INDICATOR 7.5 (GLOVE) ×1
GLOVE BIOGEL PI INDICATOR 8 (GLOVE) ×1
GLOVE ECLIPSE 8.0 STRL XLNG CF (GLOVE) ×12 IMPLANT
GOWN STRL REUS W/ TWL LRG LVL3 (GOWN DISPOSABLE) ×6 IMPLANT
GOWN STRL REUS W/ TWL XL LVL3 (GOWN DISPOSABLE) ×3 IMPLANT
GOWN STRL REUS W/TWL LRG LVL3 (GOWN DISPOSABLE) ×2
GOWN STRL REUS W/TWL XL LVL3 (GOWN DISPOSABLE) ×1
HEAD EXPLOR 12X24MM (Orthopedic Implant) ×4 IMPLANT
IMPL STEM W/SCREW 7X26MM (Stem) ×3 IMPLANT
IMPLANT STEM W/SCREW 7X26MM (Stem) ×4 IMPLANT
KIT BASIN OR (CUSTOM PROCEDURE TRAY) ×4 IMPLANT
KIT TURNOVER KIT B (KITS) ×4 IMPLANT
MANIFOLD NEPTUNE II (INSTRUMENTS) ×4 IMPLANT
MARKER SKIN DUAL TIP RULER LAB (MISCELLANEOUS) ×4 IMPLANT
NEEDLE 25GAX1.5 (MISCELLANEOUS) ×4 IMPLANT
NEEDLE HYPO 21X1.5 SAFETY (NEEDLE) IMPLANT
NEEDLE HYPO 25GX1X1/2 BEV (NEEDLE) ×4 IMPLANT
NON LOCK SCREW 1.5X20MM (Screw) ×4 IMPLANT
NS IRRIG 1000ML POUR BTL (IV SOLUTION) ×8 IMPLANT
PACK ORTHO EXTREMITY (CUSTOM PROCEDURE TRAY) ×4 IMPLANT
PACK TOTAL JOINT (CUSTOM PROCEDURE TRAY) ×4 IMPLANT
PAD ARMBOARD 7.5X6 YLW CONV (MISCELLANEOUS) ×8 IMPLANT
PAD CAST 3X4 CTTN HI CHSV (CAST SUPPLIES) ×3 IMPLANT
PAD CAST 4YDX4 CTTN HI CHSV (CAST SUPPLIES) ×6 IMPLANT
PADDING CAST COTTON 3X4 STRL (CAST SUPPLIES) ×1
PADDING CAST COTTON 4X4 STRL (CAST SUPPLIES) ×2
PADDING CAST COTTON 6X4 STRL (CAST SUPPLIES) ×8 IMPLANT
PENCIL BUTTON HOLSTER BLD 10FT (ELECTRODE) ×4 IMPLANT
PLATE LCP 3.5 1/3 TUB 5HX57 (Plate) ×4 IMPLANT
PLATE LCP 3.5 1/3 TUB 6HX69 (Plate) ×4 IMPLANT
PLATE T SMALL 1.5MM (Plate) ×4 IMPLANT
RETRIEVER SUT HEWSON (MISCELLANEOUS) ×4 IMPLANT
SCREW 1.5X18MM (Screw) ×1 IMPLANT
SCREW BN 18X1.5XST NONLOCK (Screw) ×3 IMPLANT
SCREW CORT HEADED ST 3.5X28 (Screw) ×8 IMPLANT
SCREW HEADED ST 3.5X40 (Screw) ×4 IMPLANT
SCREW HEADED ST 3.5X42 (Screw) ×4 IMPLANT
SCREW HEADED ST 3.5X46 (Screw) ×12 IMPLANT
SCREW HEADED ST 3.5X48 (Screw) ×4 IMPLANT
SCREW HEADED ST 3.5X70 (Screw) ×4 IMPLANT
SCREW HEADED ST 3.5X75 (Screw) ×8 IMPLANT
SCREW L 1.5X14 (Screw) ×4 IMPLANT
SCREW LOCKING 1.5X16 (Screw) ×4 IMPLANT
SCREW LOCKING 1.5X18MM (Screw) ×4 IMPLANT
SCREW LOCKING 1.5X8 (Screw) ×4 IMPLANT
SCREW LOCKING EXPLOR (Screw) ×4 IMPLANT
SCREW NON LOCK 1.5X12 13122051 (Screw) ×4 IMPLANT
SCREW NON LOCK 1.5X20MM (Screw) ×3 IMPLANT
SCREW NONIOC 1.5 10M (Screw) ×4 IMPLANT
SCREW NONIOC 1.5 14M (Screw) ×4 IMPLANT
SPONGE LAP 18X18 X RAY DECT (DISPOSABLE) ×8 IMPLANT
STAPLER VISISTAT 35W (STAPLE) ×4 IMPLANT
STRIP CLOSURE SKIN 1/2X4 (GAUZE/BANDAGES/DRESSINGS) IMPLANT
SUCTION FRAZIER HANDLE 10FR (MISCELLANEOUS) ×1
SUCTION TUBE FRAZIER 10FR DISP (MISCELLANEOUS) ×3 IMPLANT
SUT ETHILON 2 0 FS 18 (SUTURE) ×8 IMPLANT
SUT ETHILON 3 0 PS 1 (SUTURE) ×24 IMPLANT
SUT MAXBRAID (SUTURE) ×4 IMPLANT
SUT MNCRL AB 3-0 PS2 18 (SUTURE) ×4 IMPLANT
SUT MON AB 2-0 CT1 36 (SUTURE) ×8 IMPLANT
SUT PDS AB 2-0 CT1 27 (SUTURE) IMPLANT
SUT PROLENE 0 CT (SUTURE) IMPLANT
SUT PROLENE 3 0 PS 2 (SUTURE) ×8 IMPLANT
SUT VIC AB 0 CT1 27 (SUTURE) ×2
SUT VIC AB 0 CT1 27XBRD ANBCTR (SUTURE) ×6 IMPLANT
SUT VIC AB 1 CT1 27 (SUTURE) ×1
SUT VIC AB 1 CT1 27XBRD ANBCTR (SUTURE) ×3 IMPLANT
SUT VIC AB 2-0 CT1 27 (SUTURE) ×4
SUT VIC AB 2-0 CT1 TAPERPNT 27 (SUTURE) ×12 IMPLANT
SUT VIC AB 2-0 CT3 27 (SUTURE) IMPLANT
SYR BULB IRRIGATION 50ML (SYRINGE) ×4 IMPLANT
SYR CONTROL 10ML LL (SYRINGE) ×4 IMPLANT
TOWEL GREEN STERILE (TOWEL DISPOSABLE) ×8 IMPLANT
TOWEL OR 17X24 6PK STRL BLUE (TOWEL DISPOSABLE) ×4 IMPLANT
TRAY FOL W/BAG SLVR 16FR STRL (SET/KITS/TRAYS/PACK) ×3 IMPLANT
TRAY FOLEY W/BAG SLVR 16FR LF (SET/KITS/TRAYS/PACK) ×1
TUBE CONNECTING 12X1/4 (SUCTIONS) ×8 IMPLANT
UNDERPAD 30X30 (UNDERPADS AND DIAPERS) ×4 IMPLANT
YANKAUER SUCT BULB TIP NO VENT (SUCTIONS) ×8 IMPLANT

## 2017-06-19 NOTE — Consult Note (Signed)
Orthopaedic Trauma Service (OTS) Consult   Patient ID: Ryan Ward MRN: [id number] DOB/AGE: 08-Jan-1945 72 y.o.  Reason for Consult:Fall from roof Referring Physician: Dr. Laurelyn Sickle, MD [facility]  HPI: Ryan Ward is an 72 y.o. male who is being seen in consultation at the request of Dr. Amedeo Plenty for evaluation s/p fall off the roof.  This is an individual who has a history of coronary artery disease status post CABG who was cleaning his roof when he tripped and fell and fell off a two-story roof.  He landed on 1 of the other stories and then fell to the ground.  He presented as a level 2 trauma.  The trauma team did not admit.  And as result orthopedics was consulted.  He had a right elbow fracture dislocation along with a right small finger fracture and a right ankle fracture.  Dr. Amedeo Plenty had saw him in the emergency room and reduced his elbow and splinted it.  Patient notes that he has been in good health.  He had a CABG in 2017 and sees his cardiologist every 6 months.  The last time he saw him was in March.  He has had no issues.  No shortness of breath and no chest pain.  He is active doing multiple jobs around the house.  Currently complains of no pain in his left upper or lower extremity.  The pain in his right elbow is improved after the reduction however he still has significant pain in his finger and his ankle.  Denies any numbness or tingling to his right upper right lower extremity.  Past Medical History:  Diagnosis Date  . Asthma    VERY MILD  . CAD (coronary artery disease)    a. 12/07/14 Inf STEMI/PCI: LAD 85p/m, 59m/d, LCX 80ost, RCA 30p/m, 37m/d, 100d (4.0x20 Promus Premier DES). Hosp course complicated by VF arrest, CGS, CHB req temp wire; b. 03/2015 Cath: LAD 85p/m, 95d, LCx 80ost, RCA 30-40p/m/d, RPDA 75, EF 30-35%, Nl CO; c. 04/06/2015 CABGx4 (LIMA->LAD, VG->Diag, Seq VG->prox RPDA->mid RPDA.  [name] Chronic systolic CHF (congestive heart failure) ([place])    a. 03/2015  Echo: EF 30-35%, diff HK.  [name] Hyperlipidemia   . Hypertensive heart disease   . Ischemic cardiomyopathy    a. 03/2015 Echo: EF 30-35%, diff HK, mild MR, mod dil RA, PASP 58mmHg.  . OSA (obstructive sleep apnea)    a. not using CPAP at home, does have machine  . Paroxysmal atrial fibrillation ([place])    a. 12/2014 In setting of Inf MI->convertred spont.  . Pulmonary embolism ([place])    a. 12/2014 R PT and peroneal vein DVT and RUL PE-->Xarelto x 3 mos.    Past Surgical History:  Procedure Laterality Date  . CARDIAC CATHETERIZATION N/A 12/07/2014   Procedure: Coronary Stent Intervention;  Surgeon: Peter M Martinique, MD;  Location: [place] [facility];  Service: Cardiovascular;  Laterality: N/A;  STEMI  . CARDIAC CATHETERIZATION N/A 12/07/2014   Procedure: IABP Insertion;  Surgeon: Peter M Martinique, MD;  Location: [place] [facility];  Service: Cardiovascular;  Laterality: N/A;  . CARDIAC CATHETERIZATION N/A 12/07/2014   Procedure: Temporary Pacemaker;  Surgeon: Peter M Martinique, MD;  Location: [place] [facility];  Service: Cardiovascular;  Laterality: N/A;  . CARDIAC CATHETERIZATION N/A 12/07/2014   Procedure: Left Heart Cath and Coronary Angiography;  Surgeon: Peter M Martinique, MD;  Location: [place] [facility];  Service: Cardiovascular;  Laterality: N/A;  . CARDIAC CATHETERIZATION N/A 03/29/2015  Procedure: Right/Left Heart Cath and Coronary Angiography;  Surgeon: Peter M Martinique, MD;  Location: [place] [facility];  Service: Cardiovascular;  Laterality: N/A;  . CORONARY ARTERY BYPASS GRAFT N/A 04/06/2015   Procedure: CORONARY ARTERY BYPASS GRAFT TIMES FOUR  USING LEFT INTERNAL MAMMARY ARTERY TO THE LEFT ANTERIOR DESCENDING, BILATERAL GREATER SAPHENOUS ENDOVEIN HARVEST GRAFT TO PROXIMAL AND MID POSTERIOR DESCENDING AND DIAGONAL CORONARY ARTERIES AND PLACEMENT OR RIGHT FEMORAL A-LINE. ;  Surgeon: [name], MD;  Location: [place];  Service: Open Heart Surgery;  Laterality: N/A;  . HERNIA REPAIR    . TEE  WITHOUT CARDIOVERSION N/A 04/06/2015   Procedure: TRANSESOPHAGEAL ECHOCARDIOGRAM (TEE);  Surgeon: [name], MD;  Location: [place];  Service: Open Heart Surgery;  Laterality: N/A;  . TONSILLECTOMY      Family History  Problem Relation Age of Onset  . Heart attack Father     Social History:  reports that he has never smoked. He has never used smokeless tobacco. He reports that he does not drink alcohol or use drugs.  Allergies: No Known Allergies  Medications:  No current facility-administered medications on file prior to encounter.    Current Outpatient Medications on File Prior to Encounter  Medication Sig Dispense Refill  . aspirin EC 81 MG tablet Take 81 mg by mouth daily.    [name] atorvastatin (LIPITOR) 80 MG tablet TAKE 1 TABLET (80 MG TOTAL) BY MOUTH DAILY AT 6 PM. 90 tablet 3  . Cholecalciferol (VITAMIN D3) 5000 UNITS TABS Take 5,000 Units by mouth daily.     . famotidine (PEPCID) 20 MG tablet Take 20 mg by mouth 2 (two) times daily. Pt reports he takes prn depending on dietary intake    . furosemide (LASIX) 40 MG tablet TAKE 1 TABLET BY MOUTH EVERY DAY 90 tablet 3  . KLOR-CON M20 20 MEQ tablet TAKE 2 TABLETS BY MOUTH EVERY DAY 90 tablet 3  . levothyroxine (SYNTHROID, LEVOTHROID) 50 MCG tablet TAKE 1 TABLET (50 MCG TOTAL) BY MOUTH 2 (TWO) TIMES DAILY. 180 tablet 3  . losartan (COZAAR) 25 MG tablet Take 1 tablet (25 mg total) by mouth daily. 90 tablet 2  . Multiple Vitamin (MULTIVITAMIN WITH MINERALS) TABS tablet Take 1 tablet by mouth daily.    [name] PROAIR HFA 108 (90 Base) MCG/ACT inhaler Inhale 2 puffs into the lungs every 6 (six) hours as needed for wheezing.       ROS: Constitutional: No fever or chills Vision: No changes in vision ENT: No difficulty swallowing CV: No chest pain Pulm: No SOB or wheezing GI: No nausea or vomiting GU: No urgency or inability to hold urine Skin: No poor wound healing Neurologic: No numbness or tingling Psychiatric: No depression or  anxiety Heme: No bruising Allergic: No reaction to medications or food   Exam: Blood pressure 90/62, pulse 64, temperature 98 F (36.7 C), resp. rate 18, height 5\' 7"  (1.702 m), weight 90.7 kg (200 lb), SpO2 95 %. General: No acute distress Orientation: Awake alert and oriented x3 Mood and Affect: Cooperative and pleasant Gait: Unable to assess due to fractures Coordination and balance: Within normal limits  Right upper extremity: Splint is in place is clean dry and intact.  No crepitus or pain with range of motion or palpation of the shoulder.  No skin lesions or abrasions to the shoulder.  The right hand has motor and sensory function to median radial and ulnar nerve distributions.  There is notable swelling and mild deformity  to the small finger.  He has brisk cap refill to all fingertips.  He is able to wiggle his fingers on that side without difficulty.  No obvious deformities otherwise noted.  Right lower extremity: Splint is in place clean dry and intact.  Compartments are soft and compressible.  Active great toe extension and flexion.  Sensation intact to the dorsum and plantar aspect of his foot.  Unable to fully assess swelling due to his splint that is in place.  No deformity or pain about his knee or hip.  No skin lesions about the knee or hip as well.  Left upper extremity and left lower extremity: Skin without lesions. No tenderness to palpation. Full painless ROM, full strength in each muscle groups without evidence of instability.   Medical Decision Making: Imaging: X-rays of the right elbow and CT scan are reviewed which showed a comminuted radial head fracture with associated small coronoid fracture with a well reduced ulnohumeral joint.  X-rays of the right hand show a proximal small finger proximal phalanx fracture with no apparent extension into the joint.  X-rays and CT of the right ankle show a relatively large posterior malleolus fracture with minimal step-off.   There is also a minimally displaced medial malleolus fracture.  There is small avulsion of the ATFL off the distal tibia associated with a syndesmotic injury.  Labs:  CBC    Component Value Date/Time   WBC 15.9 (H) 06/18/2017 1903   RBC 4.73 06/18/2017 1903   HGB 14.3 06/18/2017 1903   HCT 44.0 06/18/2017 1903   PLT 194 06/18/2017 1903   MCV 93.0 06/18/2017 1903   MCH 30.2 06/18/2017 1903   MCHC 32.5 06/18/2017 1903   RDW 13.8 06/18/2017 1903   LYMPHSABS 1.4 06/18/2017 1903   MONOABS 1.1 (H) 06/18/2017 1903   EOSABS 0.2 06/18/2017 1903   BASOSABS 0.1 06/18/2017 1903    Medical history and chart was reviewed  Assessment/Plan: 72 year old male with a history of coronary artery disease status post CABG with a fall from a roof with multiple orthopedic injuries:  1.  Terrible triad fracture dislocation of the right elbow-we will require open reduction internal fixation with a radial head replacement and likely lateral ligament repair. 2.  Small finger proximal phalanx fracture-we will require closed reduction and percutaneous pinning versus open reduction internal fixation. 3.  Right ankle fracture-large posterior malleolus and medial malleolus fragments.  Will likely need full open approach with a posterior lateral incision and a medial incision.  We will plan to proceed with ORIF if the patient is not too swollen.  I discussed risks and benefits of all surgical procedures with the patient.  We will plan to proceed with the elbow first and fix the ankle if he is not too swollen he is doing well from a anesthesia standpoint. Risks discussed included bleeding requiring blood transfusion, bleeding causing a hematoma, infection, malunion, nonunion, damage to surrounding nerves and blood vessels, pain, hardware prominence or irritation, hardware failure, stiffness, post-traumatic arthritis, DVT/PE, compartment syndrome, and even death.  The patient is at risk for perioperative complications due  to his previous heart history.  I will obtain an EKG to evaluate for any changes.  He has seen his cardiologist in the last few months and is stable in terms of his medical problems. Patient will be nonweightbearing right upper and right lower extremity for likely 6-8 weeks postoperatively.   Shona Needles, MD [facility] [phone] (phone)

## 2017-06-19 NOTE — Transfer of Care (Signed)
Immediate Anesthesia Transfer of Care Note  Patient: Ryan Ward  Procedure(s) Performed: OPEN REDUCTION ELBOW DISLOCATION WITH RADIAL HEAD REPLACEMENT (Right Elbow) OPEN REDUCTION INTERNAL FIXATION (ORIF) PROXIMAL PHALANX (Right Finger) OPEN REDUCTION INTERNAL FIXATION (ORIF) ANKLE FRACTURE (Right Ankle)  Patient Location: PACU  Anesthesia Type:General  Level of Consciousness: drowsy and patient cooperative  Airway & Oxygen Therapy: Patient Spontanous Breathing and Patient connected to nasal cannula oxygen  Post-op Assessment: Report given to RN  Post vital signs: Reviewed and stable  Last Vitals:  Vitals Value Taken Time  BP 127/69 06/19/2017  6:04 PM  Temp    Pulse 84 06/19/2017  6:05 PM  Resp 16 06/19/2017  6:05 PM  SpO2 93 % 06/19/2017  6:05 PM  Vitals shown include unvalidated device data.  Last Pain:  Vitals:   06/19/17 0718  TempSrc:   PainSc: 4          Complications: No apparent anesthesia complications

## 2017-06-19 NOTE — ED Notes (Signed)
Orthopedic at bedside

## 2017-06-19 NOTE — Op Note (Signed)
OrthopaedicSurgeryOperativeNote [phone]) Date of Surgery: 06/19/2017  Admit Date: 06/18/2017   Diagnoses: Pre-Op Diagnoses: Right elbow fracture dislocation (terrible triad) Right small finger proximal phalanx fracture Right posterior malleolus and medial malleolus fractures   Post-Op Diagnosis: Same  Procedures: 1. CPT 24587-Open treatment of elbow dislocation with radial head replacment 2. CPT 26735-Open reduction internal fixation of right small finger proximal phalanx 3. CPT 27814-Open reduction internal fixation of right bimalleolar ankle fracture  Surgeons: Primary: Shona Needles, MD   Location:[facility] OR ROOM 04   AnesthesiaGeneral   Antibiotics:Ancef 2g preop   Tourniquettime: Total Tourniquet Time Documented: Upper Arm (Right) - 73 minutes Total: Upper Arm (Right) - 73 minutes  Thigh (Right) - 111 minutes Total: Thigh (Right) - 111 minutes  [id number]:768 mL   Complications:None  Specimens:None  Implants: Implant Name Type Inv. Item Serial No. Manufacturer Lot No. LRB No. Used Action  IMPLANT STEM W/SCREW 7X26MM - [id number] Stem IMPLANT STEM W/SCREW 7X26MM  [employer]IMMER [employer]ON(ORTH,TRAU,BIO,SG) [id number] Right 1 Implanted  SCREW LOCKING EXPLOR - [id number] Screw SCREW LOCKING EXPLOR  [employer]ON(ORTH,TRAU,BIO,SG) [id number] Right 1 Implanted  HEAD EXPLOR 12X24MM - [id number] Orthopedic Implant HEAD EXPLOR 12X24MM  ZIMMER [employer]ON(ORTH,TRAU,BIO,SG) [id number] Right 1 Implanted  KIT DISPOSABLE J-KNOT SZ1 - [id number] Anchor KIT DISPOSABLE J-KNOT SZ1  ZIMMER [employer]ON(ORTH,TRAU,BIO,SG) [id number] Right 1 Implanted  SCREW HEADED ST 3.5X46 - [id number] Screw SCREW HEADED ST 3.5X46  SYNTHES TRAUMA  Right 3 Implanted  SCREW HEADED ST 3.5X42 - [id number] Screw SCREW HEADED ST 3.5X42  SYNTHES TRAUMA  Right 1 Implanted  SCREW CORT HEADED ST 3.5X28 - [id number] Screw SCREW CORT HEADED ST 3.5X28  SYNTHES TRAUMA  Right 2 Implanted  SCREW HEADED ST 3.5X75 - [id number] Screw SCREW  HEADED ST 3.5X75  SYNTHES TRAUMA  Right 2 Implanted  PLATE T SMALL 7.5QG - [id number] Plate PLATE T SMALL 1.9XJ  ZIMMER [employer]ON(ORTH,TRAU,BIO,SG)  Right 1 Implanted  NON LOCK SCREW 1.5X20MM - [id number] Screw NON LOCK SCREW 1.5X20MM  ZIMMER [employer]ON(ORTH,TRAU,BIO,SG)  Right 1 Implanted  SCREW NONIOC 1.5 5M - [id number] Screw SCREW NONIOC 1.5 5M  ZIMMER [employer]ON(ORTH,TRAU,BIO,SG)  Right 1 Implanted  SCREW L 1.5X14 - [id number] Screw SCREW L 1.5X14  ZIMMER [employer]ON(ORTH,TRAU,BIO,SG)  Right 1 Implanted  SCREW LOCKING 1.5X16 - [id number] Screw SCREW LOCKING 1.5X16  ZIMMER [employer]ON(ORTH,TRAU,BIO,SG)  Right 1 Implanted  SCREW LOCKING 1.5X18MM - [id number] Screw SCREW LOCKING 1.5X18MM  ZIMMER [employer]ON(ORTH,TRAU,BIO,SG)  Right 1 Implanted  PLATE TUBULAR W/COLLAR - [id number] Plate PLATE TUBULAR W/COLLAR  SYNTHES TRAUMA  Right 1 Implanted  SCREW HEADED ST 3.5X40 - [id number] Screw SCREW HEADED ST 3.5X40  SYNTHES TRAUMA  Right 1 Implanted  SCREW HEADED ST 3.5X48 - [id number] Screw SCREW HEADED ST 3.5X48  SYNTHES TRAUMA  Right 1 Implanted    IndicationsforSurgery: 72 year old male with a history of coronary artery disease status post CABG with a fall from a roof with multiple orthopedic injuries. Terrible triad fracture dislocation of the right elbow which I feel will require open reduction internal fixation with a radial head replacement and likely lateral ligament repair. Also had a small finger proximal phalanx fracture which will require closed reduction and percutaneous pinning versus open reduction internal fixation. Lastly, right ankle fracture-large posterior malleolus and medial malleolus fragments.  Will likely need full open approach with a posterior lateral incision and a medial incision.  We will plan to proceed with ORIF if the patient is not too swollen. I discussed risks and benefits of all surgical procedures with the patient.  We will plan to proceed  with the elbow first and fix the ankle if he is not too swollen he  is doing well from a anesthesia standpoint. Risks discussed included bleeding requiring blood transfusion, bleeding causing a hematoma, infection, malunion, nonunion, damage to surrounding nerves and blood vessels, pain, hardware prominence or irritation, hardware failure, stiffness, post-traumatic arthritis, DVT/PE, compartment syndrome, and even death.  The patient is at risk for perioperative complications due to his previous heart history.  Patient understands risks and wishes to proceed with surgery, consent was obtained.  Operative Findings: 1.  Open reduction of right elbow fracture dislocation with radial head replacement with implants noted as above.  Also repair of anterior capsule and coronoid fracture with FiberWire suture and lateral ligament repair using Juggernaut suture anchor 2.  Open reduction internal fixation of right small finger proximal phalanx fracture using Zimmer [employer] 1.5 mm small-T plate. 3.  Open reduction internal fixation of large posterior malleolus fragment using posterior lateral approach lag fixation buttress 5 hole one third tubular Synthes plate.  Fixation of medial malleolus using 3.5 mm low-profile Synthes screws.  Procedure: The patient was identified in the preoperative holding area. Consent was confirmed with the patient and their family and all questions were answered. The operative extremity was marked after confirmation with the patient. he was then brought back to the operating room by our anesthesia colleagues.  He was placed under general anesthetic and carefully transferred over to a radiolucent flat top table.  His arm was positioned on a radiolucent hand table. The operative extremity was then prepped and draped in usual sterile fashion. A preoperative timeout was performed to verify the patient, the procedure, and the extremity. Preoperative antibiotics were dosed.  A sterile tourniquet was then placed to the upper arm.  He had significant abrasions to  his dorsal lateral aspect of his forearm.  I marked out an incision out of the way of the abrasions to be able to access the lateral joint.  The tourniquet was then inflated to 250 mmHg.  Please see above for total tourniquet time.  The lateral approach was then made.  There was a rent in the fascia and extensor wad that I extended proximally and distally to be able to access the lateral joint.  The lateral ulnar collateral ligament had avulsed off from the lateral epicondyle.  I extended my incision and incised through the annular ligament.  I then resected the comminuted radial head and proximal portion of the radial neck.  I used this to size the appropriate implant.  Once the radial head was excised I then proceeded to clean out the ulnohumeral joint.  There was mild damage to the articular cartilage of the trochlea of the distal humerus.  #2 FiberWire was then used to grab the anterior capsule which had the small coronoid fragments attached to it.  The fragments were too small to definitively fix to the cancellus bed of the proximal ulna.  Drill holes were made through a accessory incision at the proximal ulna to pass the FiberWire through the ulna to tied down at the end of the case.  These were passed without difficulty.  I then proceeded to size the radial head.  I started out by broaching sequentially the metaphysis of the radial neck.  I felt that the size 7 was most appropriate as the size 8 would not sit flush in the radial neck and the medullary canal.  I then trialed off of this and felt that the 24 x 12 mm  size head and neck combination allowed for good stability without overstuffing the radiocapitellar joint.  I judge this by looking at the proximal radial ulnar joint as well as the space of the ulnohumeral joint on the AP view.  The final implant was then placed.   I then placed a juggernaut suture anchor in the lateral epicondyle.  I tied down the avulsed portion of the lateral ulnar  collateral ligament.  I then whipstitched the interval for the approach and the extensor musculature.  I then tested the stability of the elbow which I did not dislocate in full extension or flexion.  Fluoroscopic images were used to show a concentric ulnohumeral joint.  The incisions were then copiously irrigated.  A gram of vancomycin powder was placed into the incisions.  A layered closure consisting of 0 Vicryl, 2-0 Vicryl and 3-0 nylon was used.  I then turned my attention to the finger.  I felt that the proximal phalanx fracture would do best with open reduction internal fixation.  I felt that percutaneous fixation would not ideally immobilize the fracture and I would still likely have to cast him for significant period of time.  As result I felt that proceeding with a mid lateral incision to fix the fracture was most appropriate.  I made a small incision just volar to the extensor tendon.  I carried this down through skin and subcutaneous tissue.  I reflected the extensor tendon back and incised the periosteum to visualize the fracture.  From here was able to reduce the fracture and proceeded to provisionally pin a small T 1.5 mm plate.  Fluoroscopy was used to confirm adequate placement of the plate.  I then proceeded to drill in place a mixture of locking and nonlocking screws.  I was able to get good fixation of both the proximal and distal segments.  No motion at the fracture was seen with a movement of the MCP or PIP joint.  Final fluoroscopic images were obtained.  The wound was copiously irrigated.  A layer closure of 2-0 Monocryl and 3-0 nylon was used.  The incisions of both the elbow and the hand were then dressed with bacitracin ointment, Adaptic and 4 x 4's.  A well-padded long-arm splint was then placed.  Another fluoroscopic image was used to confirm that the ulnohumeral joint was reduced and concentric.  The patient was then positioned on lateral with a beanbag with all bony prominences  padded.  An axillary roll was placed.  I then turned my attention to the right ankle.  Another timeout was performed to verify the patient, the procedure and the extremity.  A posterior lateral approach to the ankle joint was then made.  It was carried through skin and subcutaneous tissues.  The sural nerve was protected through the case.  The interval between the peroneals and Achilles tendon was developed.  The fascia overlying the FHL was incised.  The FHL was then reflected posteriorly to access the posterior aspect of the tibia.  Here I encountered the fracture fragment that was displaced.  I knew that there was a significant component of the syndesmosis involved and as result I manipulated the fibula back into the incisura.  I dorsiflex the ankle and then proceeded to place a small percutaneous incision over the anterior lateral aspect of the tibia to provide a clamp to compress the fracture in place.  Fluoroscopy was used to confirm anatomic reduction of the joint.  3.5 mm lag screws were placed from posterior  to anterior to fix the joint fragment.  A 5 hole Synthes one third tubular plate was then used to buttress the posterior malleolus fragment.  Once I was pleased with the reduction of the posterior malleolus I turned my attention to the medial malleolus.  A curvilinear incision over the fracture was obtained.  Careful dissection was performed to protect the saphenous nerve and vein.  A 2.5 mm drill hole was made in the metaphysis and a small reduction tenaculum was used to compress and reduce the medial malleolus anatomically.  A 2.5 mm drill bit was then used to bicortically drill through the tibia.  This was done twice a 3.5 millimeter screws were placed to provide fixation for the medial malleolus.  Final fluoroscopic images were obtained.  The incisions were copiously irrigated.  The tourniquet was dropped.  A gram of vancomycin powder was placed into the wound.  A layer closure consisting of  2-0 Vicryl and 3-0 nylon was used.  A sterile dressing consisting of bacitracin ointment, Adaptic, 4 x 4's and sterile cast padding.  A well-padded short leg splint was then placed.  The patient was awoken from anesthesia and taken to the PACU in stable condition.  Post Op Plan/Instructions: Patient will be nonweightbearing on the right upper and right lower extremity.  He will receive Lovenox for DVT prophylaxis starting postoperative day 1.  He will receive Ancef for surgical prophylaxis.  He will mobilize with physical and occupational therapy.  I was present and performed the entire surgery.  Katha Hamming, MD [facility]

## 2017-06-19 NOTE — Anesthesia Preprocedure Evaluation (Addendum)
Anesthesia Evaluation  Patient identified by MRN, date of birth, ID band Patient awake    Reviewed: Allergy & Precautions, H&P , NPO status , Patient's Chart, lab work & pertinent test results  Airway Mallampati: III  TM Distance: <3 FB Neck ROM: Full    Dental no notable dental hx. (+) Teeth Intact, Dental Advidsory Given   Pulmonary asthma , sleep apnea , PE   Pulmonary exam normal breath sounds clear to auscultation       Cardiovascular hypertension, On Medications + CAD, + Past MI and +CHF  Normal cardiovascular exam+ dysrhythmias Atrial Fibrillation  Rhythm:Regular Rate:Normal     Neuro/Psych negative neurological ROS  negative psych ROS   GI/Hepatic negative GI ROS, Neg liver ROS,   Endo/Other  negative endocrine ROS  Renal/GU negative Renal ROS  negative genitourinary   Musculoskeletal negative musculoskeletal ROS (+)   Abdominal   Peds negative pediatric ROS (+)  Hematology negative hematology ROS (+)   Anesthesia Other Findings   Reproductive/Obstetrics negative OB ROS                            Anesthesia Physical Anesthesia Plan  ASA: III  Anesthesia Plan: General   Post-op Pain Management:    Induction: Intravenous  PONV Risk Score and Plan: 2 and Ondansetron and Treatment may vary due to age or medical condition  Airway Management Planned: LMA and Oral ETT  Additional Equipment:   Intra-op Plan:   Post-operative Plan: Extubation in OR  Informed Consent: I have reviewed the patients History and Physical, chart, labs and discussed the procedure including the risks, benefits and alternatives for the proposed anesthesia with the patient or authorized representative who has indicated his/her understanding and acceptance.   Dental Advisory Given  Plan Discussed with: CRNA  Anesthesia Plan Comments:        Anesthesia Quick Evaluation

## 2017-06-19 NOTE — Anesthesia Procedure Notes (Signed)
Procedure Name: Intubation Date/Time: 06/19/2017 12:18 PM Performed by: Neldon Newport, CRNA Pre-anesthesia Checklist: Timeout performed, Patient being monitored, Suction available, Emergency Drugs available and Patient identified Patient Re-evaluated:Patient Re-evaluated prior to induction Oxygen Delivery Method: Circle system utilized Preoxygenation: Pre-oxygenation with 100% oxygen Induction Type: IV induction Ventilation: Mask ventilation without difficulty Laryngoscope Size: Mac and 4 Grade View: Grade II Tube type: Oral Tube size: 7.5 mm Number of attempts: 1 Placement Confirmation: breath sounds checked- equal and bilateral,  ETT inserted through vocal cords under direct vision and positive ETCO2 Secured at: 23 cm Tube secured with: Tape Dental Injury: Teeth and Oropharynx as per pre-operative assessment

## 2017-06-20 ENCOUNTER — Encounter (HOSPITAL_COMMUNITY): Payer: Self-pay | Admitting: Student

## 2017-06-20 LAB — CBC
HEMATOCRIT: 36.2 % — AB (ref 39.0–52.0)
HEMOGLOBIN: 11.6 g/dL — AB (ref 13.0–17.0)
MCH: 30.1 pg (ref 26.0–34.0)
MCHC: 32 g/dL (ref 30.0–36.0)
MCV: 94 fL (ref 78.0–100.0)
Platelets: 154 10*3/uL (ref 150–400)
RBC: 3.85 MIL/uL — AB (ref 4.22–5.81)
RDW: 14.2 % (ref 11.5–15.5)
WBC: 13.5 10*3/uL — AB (ref 4.0–10.5)

## 2017-06-20 LAB — BASIC METABOLIC PANEL
Anion gap: 7 (ref 5–15)
BUN: 25 mg/dL — AB (ref 6–20)
CO2: 28 mmol/L (ref 22–32)
CREATININE: 1.53 mg/dL — AB (ref 0.61–1.24)
Calcium: 8.4 mg/dL — ABNORMAL LOW (ref 8.9–10.3)
Chloride: 107 mmol/L (ref 101–111)
GFR calc Af Amer: 51 mL/min — ABNORMAL LOW (ref >60)
GFR calc non Af Amer: 44 mL/min — ABNORMAL LOW (ref >60)
Glucose, Bld: 108 mg/dL — ABNORMAL HIGH (ref 65–99)
Potassium: 4.4 mmol/L (ref 3.5–5.1)
Sodium: 142 mmol/L (ref 135–145)

## 2017-06-20 MED ORDER — ENOXAPARIN SODIUM 40 MG/0.4ML ~~LOC~~ SOLN
40.0000 mg | SUBCUTANEOUS | Status: DC
  Administered 2017-06-20 – 2017-06-22 (×3): 40 mg via SUBCUTANEOUS
  Filled 2017-06-20 (×3): qty 0.4

## 2017-06-20 NOTE — Progress Notes (Signed)
Orthopaedic Trauma Progress Note  S: Doing well, pain controlled after receiving Percocet last night. No shortness of breath or chest pain. States that his blood pressure runs low since his heart attack in 2016. Denies numbness or tingling  O:  Vitals:   06/20/17 0452 06/20/17 0814  BP:  (!) 90/57  Pulse: 72 76  Resp:    Temp:  98.7 F (37.1 C)  SpO2:  90%   Gen: NAD, AAOx3, cooperative and very pleasant RUE: Splint in place, clean, dry and intact. Motor and sensory intact to median, radial, ulnar nerve. Brisk cap refill of less than 2 sec. Compartments soft and comrpessible RLE: Splint clean, dry and intact. +EHL/FHL, compartments soft and compressible. Sensation intact to dorsum and plantar aspect. Brisk cap refill  Imaging: Stable post op imaging  Labs:  Results for orders placed or performed during the hospital encounter of 06/18/17 (from the past 24 hour(s))  CBC     Status: Abnormal   Collection Time: 06/20/17  6:07 AM  Result Value Ref Range   WBC 13.5 (H) 4.0 - 10.5 K/uL   RBC 3.85 (L) 4.22 - 5.81 MIL/uL   Hemoglobin 11.6 (L) 13.0 - 17.0 g/dL   HCT 36.2 (L) 39.0 - 52.0 %   MCV 94.0 78.0 - 100.0 fL   MCH 30.1 26.0 - 34.0 pg   MCHC 32.0 30.0 - 36.0 g/dL   RDW 14.2 11.5 - 15.5 %   Platelets 154 150 - 400 K/uL    Assessment: 72 year old male that fell from roof with history of previous CAD s/p CABG  Injuries: 1. Right terrible triad elbow fracture/dislocation: s/p open treatment with radial head replacement 2. Right small finger proximal phalanx fracture-s/p ORIF 3. Right ankle fracture-s/p ORIF  Weightbearing: NWB RUE, RLE  Insicional and dressing care: Splints clean, dry and intact for now  Orthopedic device(s):None currently  CV/Blood loss:Hgb this AM 11.6, slightly hypotensive at 90/57 this AM, HR stable will monitor, consider bolus if he continues to be low  Medical management: HTN: Losartan to be restarted today  Restart remainder of home meds including  Lasix, synthroid, potassium chloride tablets  Pain management: Toradol shceduled, Percocet 1-2 tabs PRN, Dilaudid 1mg  for breakthrough  VTE prophylaxis: Lovenox to start today  ID: Ancef for 24 hours to be completed today  Foley/Lines: Medlocked IVF, Foley to be removed after PT  Impediments to Fracture Healing: Age, and medical comorbidities (continue with vitamin D)  Dispo: TBD, PT/OT eval today  Follow - up plan: TBD  Shona Needles, MD [facility] [phone] (phone)

## 2017-06-20 NOTE — Anesthesia Postprocedure Evaluation (Signed)
Anesthesia Post Note  Patient: Fallon Howerter  Procedure(s) Performed: OPEN REDUCTION ELBOW DISLOCATION WITH RADIAL HEAD REPLACEMENT (Right Elbow) OPEN REDUCTION INTERNAL FIXATION (ORIF) PROXIMAL PHALANX (Right Finger) OPEN REDUCTION INTERNAL FIXATION (ORIF) ANKLE FRACTURE (Right Ankle)     Patient location during evaluation: PACU Anesthesia Type: General Level of consciousness: awake and alert Pain management: pain level controlled Vital Signs Assessment: post-procedure vital signs reviewed and stable Respiratory status: spontaneous breathing, nonlabored ventilation, respiratory function stable and patient connected to nasal cannula oxygen Cardiovascular status: blood pressure returned to baseline and stable Postop Assessment: no apparent nausea or vomiting Anesthetic complications: no    Last Vitals:  Vitals:   06/20/17 0452 06/20/17 0814  BP:  (!) 90/57  Pulse: 72 76  Resp:    Temp:  37.1 C  SpO2:  90%    Last Pain:  Vitals:   06/20/17 0814  TempSrc: Oral  PainSc:                  Macauley Mossberg S

## 2017-06-20 NOTE — Evaluation (Addendum)
Occupational Therapy Evaluation Patient Details Name: Ryan Ward MRN: [id number] DOB: 04-13-1945 Today's Date: 06/20/2017    History of Present Illness Pt admitted s/p fall from roof, resulting in Right elbow fracture dislocation (terrible triad) s/p ORIF, Right small finger proximal phalanx fracture s/p ORIF, and Right posterior malleolus and medial malleolus fractures s/p ORIF.  Patient is NWB R UE/LE.     Clinical Impression   PTA patient was independent with ADL, IADL, and mobility.  He lives with his girlfriend, whom he reports can provide 24/7 care at discharge.  Patient currently requires modA for UB ADL, maxA for LB ADL, and min guard assist for transfer to recliner towards left side only.  Patient is limited by NWB R UE/LE, but demonstrates good adherence during evaluation, balance, safety, coordination, and non functional use of R UE.  Patient reports discomfort with AROM in R shoulder, but can complete PROM to approx 90 degrees; educated on completing 3x/day in order to prevent stiffness in R shoulder. Limited R UE assessment due to injuries. Educated on safety, precautions, mobility, elevation to R UE/LE and ADL DME.  Recommend 3 in 1 drop arm BSC at discharge for increased safety with toilet transfers and 24/7 assistance at discharge.  Will continue to follow patient while admitted.     Follow Up Recommendations  HHOT ;Follow surgeon's recommendation for DC plan and follow-up therapies;Supervision/Assistance - 24 hour    Equipment Recommendations  3 in 1 bedside commode(drop arm- for transfers towards R side)    Recommendations for Other Services       Precautions / Restrictions Precautions Precautions: Fall Required Braces or Orthoses: Other Brace/Splint Other Brace/Splint: casted R UE and R LE Restrictions Weight Bearing Restrictions: Yes RUE Weight Bearing: Non weight bearing RLE Weight Bearing: Non weight bearing      Mobility Bed Mobility Overal bed mobility:  Needs Assistance Bed Mobility: Supine to Sit     Supine to sit: Min assist     General bed mobility comments: min assist to ascend trunk due to non functional use of R UE/LE   Transfers Overall transfer level: Needs assistance   Transfers: Stand Pivot Transfers   Stand pivot transfers: Min guard       General transfer comment: towards left side, cueing for sequencing and safety, good adherance to precautions, steady assist for balance     Balance Overall balance assessment: Needs assistance Sitting-balance support: No upper extremity supported Sitting balance-Leahy Scale: Good     Standing balance support: Single extremity supported Standing balance-Leahy Scale: Poor Standing balance comment: reliant on L UE and external support (NWB R LE/UE)                           ADL either performed or assessed with clinical judgement   ADL Overall ADL's : Needs assistance/impaired Eating/Feeding: Set up   Grooming: Moderate assistance   Upper Body Bathing: Moderate assistance;Sitting   Lower Body Bathing: Moderate assistance;Sit to/from stand   Upper Body Dressing : Moderate assistance Upper Body Dressing Details (indicate cue type and reason): decreased functional use of dominant R UE  Lower Body Dressing: Maximal assistance;Sit to/from stand Lower Body Dressing Details (indicate cue type and reason): requires assistance with R LE, pulling pants over hips, and balance  Toilet Transfer: Min guard;Stand-pivot(simulated to recliner ) Toilet Transfer Details (indicate cue type and reason): stand pivot towards L side, min guard for safety and balance, good adherance to NWB R UE/LE  Toileting- Clothing Manipulation and Hygiene: Total assistance;Sit to/from stand Toileting - Clothing Manipulation Details (indicate cue type and reason): anticipate total assistance for clothing management      Functional mobility during ADLs: Min guard;Cueing for safety;Cueing for  sequencing(stand pivot to recliner ) General ADL Comments: Patient highly motivated.  Assistance required due to limited functional use of R UE/LE.       Vision Baseline Vision/History: Wears glasses Wears Glasses: At all times Patient Visual Report: No change from baseline Vision Assessment?: No apparent visual deficits     Perception     Praxis      Pertinent Vitals/Pain Pain Assessment: 0-10 Pain Score: 1  Pain Descriptors / Indicators: Guarding;Discomfort Pain Intervention(s): Limited activity within patient's tolerance;Monitored during session;Repositioned     Hand Dominance Right   Extremity/Trunk Assessment Upper Extremity Assessment Upper Extremity Assessment: RUE deficits/detail RUE Deficits / Details: S/P ORIF R elbow and small finger proximal phalanx, casted; limited AROM R shoulder to 90 degrees due to discomfort  RUE: Unable to fully assess due to immobilization RUE Coordination: decreased gross motor;decreased fine motor   Lower Extremity Assessment Lower Extremity Assessment: Defer to PT evaluation   Cervical / Trunk Assessment Cervical / Trunk Assessment: Normal   Communication Communication Communication: No difficulties   Cognition Arousal/Alertness: Awake/alert Behavior During Therapy: WFL for tasks assessed/performed Overall Cognitive Status: Within Functional Limits for tasks assessed                                     General Comments       Exercises     Shoulder Instructions      Home Living Family/patient expects to be discharged to:: Private residence Living Arrangements: Spouse/significant other Available Help at Discharge: Available 24 hours/day(significant other) Type of Home: House       Home Layout: Two level;Able to live on main level with bedroom/bathroom     Bathroom Shower/Tub: Occupational psychologist: Standard     Home Equipment: Bedside commode;Wheelchair - manual          Prior  Functioning/Environment Level of Independence: Independent        Comments: independent with ADL, IADL, mobility PTA        OT Problem List: Decreased strength;Impaired balance (sitting and/or standing);Decreased knowledge of precautions;Pain;Decreased range of motion;Decreased safety awareness;Decreased activity tolerance;Decreased coordination;Decreased knowledge of use of DME or AE;Impaired UE functional use      OT Treatment/Interventions: Self-care/ADL training;DME and/or AE instruction;Therapeutic activities;Balance training;Energy conservation;Patient/family education    OT Goals(Current goals can be found in the care plan section) Acute Rehab OT Goals Patient Stated Goal: to go home OT Goal Formulation: With patient Time For Goal Achievement: 07/04/17 Potential to Achieve Goals: Good  OT Frequency: Min 2X/week   Barriers to D/C:            Co-evaluation              AM-PAC PT "6 Clicks" Daily Activity     Outcome Measure Help from another person eating meals?: A Little Help from another person taking care of personal grooming?: A Little Help from another person toileting, which includes using toliet, bedpan, or urinal?: A Lot Help from another person bathing (including washing, rinsing, drying)?: A Lot Help from another person to put on and taking off regular upper body clothing?: A Lot Help from another person to put on and taking off  regular lower body clothing?: A Lot 6 Click Score: 14   End of Session Equipment Utilized During Treatment: Gait belt Nurse Communication: Mobility status;Weight bearing status;Precautions  Activity Tolerance: Patient tolerated treatment well;No increased pain Patient left: in chair;with nursing/sitter in room;with call bell/phone within reach  OT Visit Diagnosis: Other abnormalities of gait and mobility (R26.89)                Time: 1696-7893 OT Time Calculation (min): 21 min Charges:  OT General Charges $OT Visit: 1  Visit OT Evaluation $OT Eval Moderate Complexity: 1 Mod G-Codes:     Delight Stare, OTR/L  Pager [place] 06/20/2017, 11:36 AM

## 2017-06-20 NOTE — Evaluation (Addendum)
Physical Therapy Evaluation Patient Details Name: Ryan Ward MRN: [id number] DOB: May 15, 1945 Today's Date: 06/20/2017   History of Present Illness  Pt is a 72 y/o male admitted s/p fall from roof, resulting in Right elbow fracture dislocation (terrible triad) s/p ORIF, Right small finger proximal phalanx fracture s/p ORIF, and Right posterior malleolus and medial malleolus fractures s/p ORIF.  Patient is NWB R UE/LE.  PMH including but not limited to CAD s/p CABG in 2017.    Clinical Impression  Pt presented supine in bed with HOB elevated, awake and willing to participate in therapy session. Prior to admission, pt reported that he was independent with all functional mobility and ADLs. Pt currently required supervision for bed mobility and min guard for transfers. Pt was able to maintain NWB R UE and NWB R LE independently throughout. Pt would continue to benefit from skilled physical therapy services at this time while admitted and after d/c to address the below listed limitations in order to improve overall safety and independence with functional mobility.  Patient is s/p R elbow ORIF, R finger ORIF and R ankle ORIF which impairs their ability to perform daily activities in the home. A walker alone will not resolve the issues with performing activities of daily living as pt is NWB R UE and R LE (pt unable to use a RW). A wheelchair will allow patient to safely perform daily activities. The patient can self propel in the home or has a caregiver who can provide assistance.      Follow Up Recommendations Home health PT;Supervision/Assistance - 24 hour    Equipment Recommendations  Wheelchair (measurements PT);Wheelchair cushion (measurements PT);3in1 (PT);Other (comment)(w/c with elevating leg rests, drop arm BSC)    Recommendations for Other Services       Precautions / Restrictions Precautions Precautions: Fall Restrictions Weight Bearing Restrictions: Yes RUE Weight Bearing: Non  weight bearing RLE Weight Bearing: Non weight bearing      Mobility  Bed Mobility Overal bed mobility: Needs Assistance Bed Mobility: Supine to Sit     Supine to sit: Supervision     General bed mobility comments: supervision for safety  Transfers Overall transfer level: Needs assistance Equipment used: None Transfers: Sit to/from Stand;Stand Pivot Transfers Sit to Stand: Min guard Stand pivot transfers: Min guard       General transfer comment: pt transferred from bed to recliner chair towards his L side with min guard for safety  Ambulation/Gait                Stairs            Wheelchair Mobility    Modified Rankin (Stroke Patients Only)       Balance Overall balance assessment: Needs assistance Sitting-balance support: No upper extremity supported Sitting balance-Leahy Scale: Good     Standing balance support: Single extremity supported Standing balance-Leahy Scale: Poor                               Pertinent Vitals/Pain Pain Assessment: Faces Faces Pain Scale: Hurts little more Pain Location: L UE Pain Descriptors / Indicators: Guarding;Discomfort Pain Intervention(s): Monitored during session;Repositioned    Home Living Family/patient expects to be discharged to:: Private residence Living Arrangements: Spouse/significant other Available Help at Discharge: Available 24 hours/day(significant other) Type of Home: House Home Access: Ramped entrance     Home Layout: Two level;Able to live on main level with bedroom/bathroom Home Equipment: Bedside commode;Wheelchair -  manual      Prior Function Level of Independence: Independent               Hand Dominance   Dominant Hand: Right    Extremity/Trunk Assessment   Upper Extremity Assessment Upper Extremity Assessment: Defer to OT evaluation    Lower Extremity Assessment Lower Extremity Assessment: RLE deficits/detail RLE Deficits / Details: s/p ORIF R  ankle with cast in place. Pt able to maintain NWB R LE throughout independently  RLE: Unable to fully assess due to pain;Unable to fully assess due to immobilization    Cervical / Trunk Assessment Cervical / Trunk Assessment: Normal  Communication   Communication: No difficulties  Cognition Arousal/Alertness: Awake/alert Behavior During Therapy: WFL for tasks assessed/performed Overall Cognitive Status: Within Functional Limits for tasks assessed                                        General Comments      Exercises     Assessment/Plan    PT Assessment Patient needs continued PT services  PT Problem List Decreased strength;Decreased range of motion;Decreased activity tolerance;Decreased balance;Decreased mobility;Decreased coordination;Decreased knowledge of use of DME;Decreased safety awareness;Decreased knowledge of precautions;Pain       PT Treatment Interventions DME instruction;Gait training;Stair training;Functional mobility training;Therapeutic activities;Therapeutic exercise;Balance training;Neuromuscular re-education;Patient/family education    PT Goals (Current goals can be found in the Care Plan section)  Acute Rehab PT Goals Patient Stated Goal: to go home PT Goal Formulation: With patient Time For Goal Achievement: 07/04/17 Potential to Achieve Goals: Good    Frequency Min 3X/week   Barriers to discharge        Co-evaluation               AM-PAC PT "6 Clicks" Daily Activity  Outcome Measure Difficulty turning over in bed (including adjusting bedclothes, sheets and blankets)?: None Difficulty moving from lying on back to sitting on the side of the bed? : None Difficulty sitting down on and standing up from a chair with arms (e.g., wheelchair, bedside commode, etc,.)?: Unable Help needed moving to and from a bed to chair (including a wheelchair)?: A Little Help needed walking in hospital room?: Total Help needed climbing 3-5 steps  with a railing? : Total 6 Click Score: 14    End of Session Equipment Utilized During Treatment: Gait belt Activity Tolerance: Patient tolerated treatment well Patient left: in chair;with call bell/phone within reach Nurse Communication: Mobility status PT Visit Diagnosis: Other abnormalities of gait and mobility (R26.89);Pain Pain - Right/Left: Left Pain - part of body: Shoulder;Arm;Leg    Time: 1459-1520 PT Time Calculation (min) (ACUTE ONLY): 21 min   Charges:   PT Evaluation $PT Eval Moderate Complexity: 1 Mod     PT G Codes:        [place], PT, DPT [place] 06/20/2017, 4:53 PM

## 2017-06-21 DIAGNOSIS — W132XXA Fall from, out of or through roof, initial encounter: Secondary | ICD-10-CM

## 2017-06-21 DIAGNOSIS — S52121A Displaced fracture of head of right radius, initial encounter for closed fracture: Secondary | ICD-10-CM

## 2017-06-21 DIAGNOSIS — S62616A Displaced fracture of proximal phalanx of right little finger, initial encounter for closed fracture: Secondary | ICD-10-CM

## 2017-06-21 DIAGNOSIS — W11XXXA Fall on and from ladder, initial encounter: Secondary | ICD-10-CM

## 2017-06-21 DIAGNOSIS — S8251XA Displaced fracture of medial malleolus of right tibia, initial encounter for closed fracture: Secondary | ICD-10-CM

## 2017-06-21 LAB — BASIC METABOLIC PANEL
Anion gap: 5 (ref 5–15)
BUN: 27 mg/dL — AB (ref 6–20)
CO2: 25 mmol/L (ref 22–32)
Calcium: 7.8 mg/dL — ABNORMAL LOW (ref 8.9–10.3)
Chloride: 109 mmol/L (ref 101–111)
Creatinine, Ser: 1.15 mg/dL (ref 0.61–1.24)
GFR calc Af Amer: >60 mL/min (ref >60)
GLUCOSE: 98 mg/dL (ref 65–99)
POTASSIUM: 4.3 mmol/L (ref 3.5–5.1)
Sodium: 139 mmol/L (ref 135–145)

## 2017-06-21 LAB — CBC
HCT: 32 % — ABNORMAL LOW (ref 39.0–52.0)
Hemoglobin: 10.3 g/dL — ABNORMAL LOW (ref 13.0–17.0)
MCH: 30.2 pg (ref 26.0–34.0)
MCHC: 32.2 g/dL (ref 30.0–36.0)
MCV: 93.8 fL (ref 78.0–100.0)
PLATELETS: 135 10*3/uL — AB (ref 150–400)
RBC: 3.41 MIL/uL — AB (ref 4.22–5.81)
RDW: 14.6 % (ref 11.5–15.5)
WBC: 10.8 10*3/uL — ABNORMAL HIGH (ref 4.0–10.5)

## 2017-06-21 MED ORDER — METHOCARBAMOL 750 MG PO TABS: 750.0000 mg | ORAL_TABLET | Freq: Four times a day (QID) | ORAL | 1 refills | Status: DC

## 2017-06-21 MED ORDER — ENOXAPARIN SODIUM 40 MG/0.4ML ~~LOC~~ SOLN: 40.0000 mg | SUBCUTANEOUS | 0 refills | Status: DC

## 2017-06-21 MED ORDER — OXYCODONE-ACETAMINOPHEN 5-325 MG PO TABS: 1.0000 | ORAL_TABLET | Freq: Four times a day (QID) | ORAL | 0 refills | Status: DC | PRN

## 2017-06-21 NOTE — Progress Notes (Signed)
Spiritual Care consult Acknowledged. Patient shared the events that led him to be in the hospital. 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 Engaged in some life review where he shared about where he went to college being drafted in [place] and his 84 years of work with [name]. All things considered Ryan Ward is in good spirits. He said that sometimes you can be "both lucky and stupid." He shared that he knows that the "Good Reita Cliche is looking out for him. Pt. Is reckoning with the reality that he will likely need to contract folks for around the house projects. He is well supported by a family that has prohibited him "from getting up on the ladder."  Patient was very grateful for our time of pastoral conversation and prayer.

## 2017-06-21 NOTE — Progress Notes (Signed)
Physical Therapy Treatment Patient Details Name: Ryan Ward MRN: [id number] DOB: Aug 30, 1945 Today's Date: 06/21/2017    History of Present Illness Pt is a 72 y/o male admitted s/p fall from roof, resulting in Right elbow fracture dislocation (terrible triad) s/p ORIF, Right small finger proximal phalanx fracture s/p ORIF, and Right posterior malleolus and medial malleolus fractures s/p ORIF.  Patient is NWB R UE/LE.  PMH including but not limited to CAD s/p CABG in 2017.    PT Comments    Pt required min guard for transfer to recliner chair. Good adherence to WB precautions. Provided pt with HEP for LE ROM and strengthening. Answered pt questions pertaining to mobility once he returns home. Will continue to follow acutely to maximize pt's safety with mobility and functional independence.     Follow Up Recommendations  Home health PT;Supervision/Assistance - 24 hour     Equipment Recommendations  Wheelchair (measurements PT);Wheelchair cushion (measurements PT);3in1 (PT);Other (comment)(w/c with elevating leg rests, drop arm BSC)    Recommendations for Other Services       Precautions / Restrictions Precautions Precautions: Fall Required Braces or Orthoses: Other Brace/Splint Other Brace/Splint: casted R UE and R LE Restrictions Weight Bearing Restrictions: Yes RUE Weight Bearing: Non weight bearing RLE Weight Bearing: Non weight bearing    Mobility  Bed Mobility Overal bed mobility: Needs Assistance Bed Mobility: Supine to Sit     Supine to sit: Supervision     General bed mobility comments: supervision for safety  Transfers Overall transfer level: Needs assistance Equipment used: None Transfers: Sit to/from [facility] Sit to Stand: Min guard Stand pivot transfers: Min guard       General transfer comment: pt transferred from bed to recliner chair towards his L side with min guard for safety. Good adhearence to precautions.  Ambulation/Gait                 Stairs             Wheelchair Mobility    Modified Rankin (Stroke Patients Only)       Balance Overall balance assessment: Needs assistance Sitting-balance support: No upper extremity supported Sitting balance-Leahy Scale: Good     Standing balance support: Single extremity supported Standing balance-Leahy Scale: Poor Standing balance comment: reliant on L UE and external support (NWB R LE/UE)                            Cognition Arousal/Alertness: Awake/alert Behavior During Therapy: WFL for tasks assessed/performed Overall Cognitive Status: Within Functional Limits for tasks assessed                                        Exercises General Exercises - Lower Extremity Ankle Circles/Pumps: AROM;Left;10 reps;Seated Quad Sets: AROM;Right;10 reps;Seated Heel Slides: AROM;Right;10 reps;Seated Hip ABduction/ADduction: AROM;Right;10 reps;Seated Straight Leg Raises: AROM;Right;10 reps;Seated    General Comments        Pertinent Vitals/Pain Pain Assessment: 0-10 Pain Score: 1  Pain Descriptors / Indicators: Guarding;Discomfort Pain Intervention(s): Monitored during session;Repositioned    Home Living                      Prior Function            PT Goals (current goals can now be found in the care plan section) Acute Rehab PT Goals  Patient Stated Goal: to go home PT Goal Formulation: With patient Time For Goal Achievement: 07/04/17 Potential to Achieve Goals: Good Progress towards PT goals: Progressing toward goals    Frequency    Min 3X/week      PT Plan Current plan remains appropriate    Co-evaluation              AM-PAC PT "6 Clicks" Daily Activity  Outcome Measure  Difficulty turning over in bed (including adjusting bedclothes, sheets and blankets)?: None Difficulty moving from lying on back to sitting on the side of the bed? : None Difficulty sitting down on and standing  up from a chair with arms (e.g., wheelchair, bedside commode, etc,.)?: Unable Help needed moving to and from a bed to chair (including a wheelchair)?: A Little Help needed walking in hospital room?: Total Help needed climbing 3-5 steps with a railing? : Total 6 Click Score: 14    End of Session Equipment Utilized During Treatment: Gait belt Activity Tolerance: Patient tolerated treatment well Patient left: in chair;with call bell/phone within reach Nurse Communication: Mobility status PT Visit Diagnosis: Other abnormalities of gait and mobility (R26.89);Pain Pain - Right/Left: Left Pain - part of body: Shoulder;Arm;Leg     Time: 9450-3888 PT Time Calculation (min) (ACUTE ONLY): 18 min  Charges:  $Therapeutic Activity: 8-22 mins                    G Codes:       Benjiman Core, [place] Pager [phone] Acute Rehab   Allena Katz 06/21/2017, 12:12 PM

## 2017-06-21 NOTE — Progress Notes (Signed)
Wheelchair Necessity   Patient is s/p R elbow ORIF, R finger ORIF and R ankle ORIF which impairs their ability to perform daily activities in the home. A walker alone will not resolve the issues with performing activities of daily living as pt is NWB R UE and R LE (pt unable to use a RW). A wheelchair will allow patient to safely perform daily activities. The patient can self propel in the home or has a caregiver who can provide assistance.  [place], [place], [place] [phone]

## 2017-06-21 NOTE — Care Management Note (Signed)
Case Management Note  Patient Details  Name: Ryan Ward MRN: [id number] Date of Birth: 02/28/1945  Subjective/Objective:  72 yr old gentleman s/p fall resulting in right elbow and right ankle fracture. Patient is s/p ORIF of right elbow and right ankle.                   Action/Plan: Case manager spoke with patient concerning discharge plan and DME. Choice for Home Health agency was offered. Referral was called to Neoma Laming, [facility] Liaison. Patient will not be going to his [place] address, will be staying at [address], [place] [address]. His contact number is [phone]. Patient says his girlfriend will assist him at discharge.     Expected Discharge Date:    06/22/17              Expected Discharge Plan:  [place]  In-House Referral:  NA  Discharge planning Services  CM Consult  Post Acute Care Choice:  Durable Medical Equipment, Home Health Choice offered to:  Patient  DME Arranged:  3-N-1 DME Agency:  [place]:  PT [facility] Agency:  [place]  Status of Service:  Completed, signed off  If discussed at [place] of Stay Meetings, dates discussed:    Additional Comments:  Ninfa Meeker, RN 06/21/2017, 12:47 PM

## 2017-06-21 NOTE — Care Management Important Message (Signed)
Important Message  Patient Details  Name: Ryan Ward MRN: [id number] Date of Birth: [birth year]   Medicare Important Message Given:  Yes    Barb Merino [name] 06/21/2017, 12:47 PM

## 2017-06-21 NOTE — Progress Notes (Signed)
Occupational Therapy Treatment Patient Details Name: Ryan Ward MRN: [id number] DOB: Apr 27, 1945 Today's Date: 06/21/2017    History of present illness Pt is a 72 y/o male admitted s/p fall from roof, resulting in Right elbow fracture dislocation (terrible triad) s/p ORIF, Right small finger proximal phalanx fracture s/p ORIF, and Right posterior malleolus and medial malleolus fractures s/p ORIF.  Patient is NWB R UE/LE.  PMH including but not limited to CAD s/p CABG in 2017.   OT comments  Patient pleasant and cooperative, progressing well and eager to participate in session.  Patient continues to demonstrate good safety awareness and adherence to NWB R UE/LE throughout transfers and self care tasks.  Patient educated on compensatory dressing and bathing techniques, AE (reacher) use. Looped ace bandage to support R UE with transfers, continued education on using pillow to support UE while seated or in bed. Task practice transfer to 3:1 drop arm BSC in order to transfer towards R side completing side scoot transfer to increase safety.  Patient voices understanding of transfer technique to maximize safety. Educated on having assistance with transfers, completing transfers towards L side and maintaining hand hold on sturdy surface due to NWB status.  Patients significant other voices concerns about dc home, but agreeable once assistance level explained to her.  Patient and significant other are determining best dc location due to bed height and access to bathroom via wc.  Discussed need for wc with PT, as wc patient thought was available is in [place].  Patient and significant other understand recommendations of 24/7 assistance, significant other reporting "I will not be available all the time" and patient responding "When you aren't available I will stay put". Will continue to follow patient while admitted.  Recommend f/u HHOT at discharge to ensure safe setup and all needs at addressed once discharged  home.      Follow Up Recommendations  Home health OT;Follow surgeon's recommendation for DC plan and follow-up therapies;Supervision/Assistance - 24 hour    Equipment Recommendations  3 in 1 bedside commode    Recommendations for Other Services      Precautions / Restrictions Precautions Precautions: Fall Required Braces or Orthoses: Other Brace/Splint Other Brace/Splint: casted R UE and LE  Restrictions Weight Bearing Restrictions: Yes RUE Weight Bearing: Non weight bearing RLE Weight Bearing: Non weight bearing       Mobility Bed Mobility Overal bed mobility: Needs Assistance Bed Mobility: Sit to Supine     Supine to sit: Supervision Sit to supine: Supervision   General bed mobility comments: for safety  Transfers Overall transfer level: Needs assistance Equipment used: None Transfers: Sit to/from [facility] Sit to Stand: Min guard Stand pivot transfers: Min guard       General transfer comment: transferred from recliner to EOB towards R side, EOB to 3:1 drop arm commode towards R side side scoot and L side stand pivot with min guard assist     Balance Overall balance assessment: Needs assistance Sitting-balance support: No upper extremity supported Sitting balance-Leahy Scale: Good     Standing balance support: Single extremity supported Standing balance-Leahy Scale: Poor Standing balance comment: reliant on L UE and external support (NWB R LE/UE)                           ADL either performed or assessed with clinical judgement   ADL  Lower Body Dressing: Moderate assistance;Sit to/from stand Lower Body Dressing Details (indicate cue type and reason): educated on use of reacher to thread pants, requires assistance to manage clothing over hips  Toilet Transfer: Min guard;Stand-pivot(or side scoot towards R side) Toilet Transfer Details (indicate cue type and reason): stand pivot towards L side,  side scoot towards R side; educated on use of 3:1 drop arm commode and safety  Toileting- Clothing Manipulation and Hygiene: Moderate assistance Toileting - Clothing Manipulation Details (indicate cue type and reason): hygiene seated, and educated on having assistance for clothing mgmt in standing so patient is able to maintain 1 hand hold for balance due to NWB status              Vision       Perception     Praxis      Cognition Arousal/Alertness: Awake/alert Behavior During Therapy: WFL for tasks assessed/performed Overall Cognitive Status: Within Functional Limits for tasks assessed                                          Exercises Exercises: General Lower Extremity General Exercises - Lower Extremity Ankle Circles/Pumps: AROM;Left;10 reps;Seated Quad Sets: AROM;Right;10 reps;Seated Heel Slides: AROM;Right;10 reps;Seated Hip ABduction/ADduction: AROM;Right;10 reps;Seated Straight Leg Raises: AROM;Right;10 reps;Seated   Shoulder Instructions       General Comments significant other and daughter arrived at completion of session, voiced concerns initally about dc home but agreeable to recomendations after explaintion of needs.     Pertinent Vitals/ Pain       Pain Assessment: 0-10 Pain Score: 1  Pain Descriptors / Indicators: Guarding;Discomfort Pain Intervention(s): Monitored during session;Repositioned  Home Living                                          Prior Functioning/Environment              Frequency  Min 2X/week        Progress Toward Goals  OT Goals(current goals can now be found in the care plan section)  Progress towards OT goals: Progressing toward goals  Acute Rehab OT Goals Patient Stated Goal: to go home OT Goal Formulation: With patient Time For Goal Achievement: 07/04/17 Potential to Achieve Goals: Good  Plan Discharge plan remains appropriate;Frequency remains appropriate     Co-evaluation                 AM-PAC PT "6 Clicks" Daily Activity     Outcome Measure   Help from another person eating meals?: A Little Help from another person taking care of personal grooming?: A Little Help from another person toileting, which includes using toliet, bedpan, or urinal?: A Lot Help from another person bathing (including washing, rinsing, drying)?: A Lot Help from another person to put on and taking off regular upper body clothing?: A Little Help from another person to put on and taking off regular lower body clothing?: A Little 6 Click Score: 16    End of Session Equipment Utilized During Treatment: Gait belt(3:1 drop arm commode)  OT Visit Diagnosis: Other abnormalities of gait and mobility (R26.89)   Activity Tolerance Patient tolerated treatment well   Patient Left in bed;with call bell/phone within reach;with family/visitor present;with SCD's reapplied   Nurse Communication  Time: [id number] OT Time Calculation (min): 50 min  Charges: OT Treatments $Self Care/Home Management : 38-52 mins  Delight Stare, OTR/L  Pager [place] 06/21/2017, 2:11 PM

## 2017-06-21 NOTE — Discharge Instructions (Signed)
Orthopaedic Trauma Service Discharge Instructions   General Discharge Instructions  WEIGHT BEARING STATUS: Nonweightbearing to right arm and right leg  RANGE OF MOTION/ACTIVITY: No knee or hip range of motion restrictions  Wound Care: Keep splints clean, dry and intact  DVT/PE prophylaxis:Take lovenox daily for 30 days  Diet: as you were eating previously.  Can use over the counter stool softeners and bowel preparations, such as Miralax, to help with bowel movements.  Narcotics can be constipating.  Be sure to drink plenty of fluids  PAIN MEDICATION USE AND EXPECTATIONS  You have likely been given narcotic medications to help control your pain.  After a traumatic event that results in an fracture (broken bone) with or without surgery, it is ok to use narcotic pain medications to help control one's pain.  We understand that everyone responds to pain differently and each individual patient will be evaluated on a regular basis for the continued need for narcotic medications. Ideally, narcotic medication use should last no more than 6-8 weeks (coinciding with fracture healing).   As a patient it is your responsibility as well to monitor narcotic medication use and report the amount and frequency you use these medications when you come to your office visit.   We would also advise that if you are using narcotic medications, you should take a dose prior to therapy to maximize you participation.  IF YOU ARE ON NARCOTIC MEDICATIONS IT IS NOT PERMISSIBLE TO OPERATE A MOTOR VEHICLE (MOTORCYCLE/CAR/TRUCK/MOPED) OR HEAVY MACHINERY DO NOT MIX NARCOTICS WITH OTHER CNS (CENTRAL NERVOUS SYSTEM) DEPRESSANTS SUCH AS ALCOHOL   STOP SMOKING OR USING NICOTINE PRODUCTS!!!!  As discussed nicotine severely impairs your body's ability to heal surgical and traumatic wounds but also impairs bone healing.  Wounds and bone heal by forming microscopic blood vessels (angiogenesis) and nicotine is a vasoconstrictor  (essentially, shrinks blood vessels).  Therefore, if vasoconstriction occurs to these microscopic blood vessels they essentially disappear and are unable to deliver necessary nutrients to the healing tissue.  This is one modifiable factor that you can do to dramatically increase your chances of healing your injury.    (This means no smoking, no nicotine gum, patches, etc)  DO NOT USE NONSTEROIDAL ANTI-INFLAMMATORY DRUGS (NSAID'S)  Using products such as Advil (ibuprofen), Aleve (naproxen), Motrin (ibuprofen) for additional pain control during fracture healing can delay and/or prevent the healing response.  If you would like to take over the counter (OTC) medication, Tylenol (acetaminophen) is ok.  However, some narcotic medications that are given for pain control contain acetaminophen as well. Therefore, you should not exceed more than 4000 mg of tylenol in a day if you do not have liver disease.  Also note that there are may OTC medicines, such as cold medicines and allergy medicines that my contain tylenol as well.  If you have any questions about medications and/or interactions please ask your doctor/PA or your pharmacist.      ICE AND ELEVATE INJURED/OPERATIVE EXTREMITY  Using ice and elevating the injured extremity above your heart can help with swelling and pain control.  Icing in a pulsatile fashion, such as 20 minutes on and 20 minutes off, can be followed.    Do not place ice directly on skin. Make sure there is a barrier between to skin and the ice pack.    Using frozen items such as frozen peas works well as the conform nicely to the are that needs to be iced.  USE AN ACE WRAP OR TED HOSE  FOR SWELLING CONTROL  In addition to icing and elevation, Ace wraps or TED hose are used to help limit and resolve swelling.  It is recommended to use Ace wraps or TED hose until you are informed to stop.    When using Ace Wraps start the wrapping distally (farthest away from the body) and wrap proximally  (closer to the body)   Example: If you had surgery on your leg or thing and you do not have a splint on, start the ace wrap at the toes and work your way up to the thigh        If you had surgery on your upper extremity and do not have a splint on, start the ace wrap at your fingers and work your way up to the upper arm  IF YOU ARE IN A SPLINT OR CAST DO NOT [place]   If your splint gets wet for any reason please contact the office immediately. You may shower in your splint or cast as long as you keep it dry.  This can be done by wrapping in a cast cover or garbage back (or similar)  Do Not stick any thing down your splint or cast such as pencils, money, or hangers to try and scratch yourself with.  If you feel itchy take benadryl as prescribed on the bottle for itching  IF YOU ARE IN A CAM BOOT (BLACK BOOT)  You may remove boot periodically. Perform daily dressing changes as noted below.  Wash the liner of the boot regularly and wear a sock when wearing the boot. It is recommended that you sleep in the boot until told otherwise  CALL THE OFFICE WITH ANY QUESTIONS OR CONCERNS: [phone]

## 2017-06-21 NOTE — Plan of Care (Signed)
  Problem: Nutrition: Goal: Adequate nutrition will be maintained Outcome: Progressing   Problem: Pain Managment: Goal: General experience of comfort will improve Outcome: Progressing   Problem: Safety: Goal: Ability to remain free from injury will improve Outcome: Progressing   Problem: Spiritual Needs Goal: Ability to function at adequate level Outcome: Progressing

## 2017-06-22 NOTE — Progress Notes (Signed)
Provided discharge education/instructions, all questions and concerns addressed, Pt not in distress, discharged home with belongings accompanied by girlfriend.

## 2017-06-22 NOTE — Progress Notes (Signed)
Subjective: 3 Days Post-Op Procedure(s) (LRB): OPEN REDUCTION ELBOW DISLOCATION WITH RADIAL HEAD REPLACEMENT (Right) OPEN REDUCTION INTERNAL FIXATION (ORIF) PROXIMAL PHALANX (Right) OPEN REDUCTION INTERNAL FIXATION (ORIF) ANKLE FRACTURE (Right) Patient reports pain as moderate. Denies difficulty with voiding, taking po food/fliud without difficulty    Objective: Vital signs in last 24 hours: Temp:  [97.4 F (36.3 C)-99.6 F (37.6 C)] 98.7 F (37.1 C) (06/22 0333) Pulse Rate:  [66-71] 71 (06/22 0333) Resp:  [14] 14 (06/22 0333) BP: (113-128)/(64-72) 120/72 (06/22 0333) SpO2:  [99 %-100 %] 99 % (06/22 0333)  Intake/Output from previous day: 06/21 0701 - 06/22 0700 In: -  Out: 1880 [Urine:1880] Intake/Output this shift: Total I/O In: -  Out: 300 [Urine:300]  Recent Labs    06/20/17 0607 06/21/17 0522  HGB 11.6* 10.3*   Recent Labs    06/20/17 0607 06/21/17 0522  WBC 13.5* 10.8*  RBC 3.85* 3.41*  HCT 36.2* 32.0*  PLT 154 135*   Recent Labs    06/20/17 0954 06/21/17 0522  NA 142 139  K 4.4 4.3  CL 107 109  CO2 28 25  BUN 25* 27*  CREATININE 1.53* 1.15  GLUCOSE 108* 98  CALCIUM 8.4* 7.8*   No results for input(s): LABPT, INR in the last 72 hours.  Neurologically intact ABD soft Neurovascular intact Sensation intact distally Intact pulses distally Compartment soft in nin splinted limbs  Anticipated LOS equal to or greater than 2 midnights due to - Age 31 and older with one or more of the following:  - Obesity  - Expected need for hospital services (PT, OT, Nursing) required for safe  discharge  - Anticipated need for postoperative skilled nursing care or inpatient rehab  - Active co-morbidities: Coronary Artery Disease OR   - Unanticipated findings during/Post Surgery: None  - Patient is a high risk of re-admission due to: None   Assessment/Plan: 3 Days Post-Op Procedure(s) (LRB): OPEN REDUCTION ELBOW DISLOCATION WITH RADIAL HEAD REPLACEMENT  (Right) OPEN REDUCTION INTERNAL FIXATION (ORIF) PROXIMAL PHALANX (Right) OPEN REDUCTION INTERNAL FIXATION (ORIF) ANKLE FRACTURE (Right) Up with therapy D/C IV fluids Discharge home with home health today  Follow up with Dr Doreatha Martin Tuesday June 25-patient will call for appointment   Ryan Ward 06/22/2017, 11:26 AM

## 2017-06-22 NOTE — Plan of Care (Signed)

## 2017-06-22 NOTE — Progress Notes (Signed)
Physical Therapy Treatment Patient Details Name: Ryan Ward MRN: [id number] DOB: 07/19/1945 Today's Date: 06/22/2017    History of Present Illness Pt is a 72 y/o male admitted s/p fall from roof, resulting in Right elbow fracture dislocation (terrible triad) s/p ORIF, Right small finger proximal phalanx fracture s/p ORIF, and Right posterior malleolus and medial malleolus fractures s/p ORIF.  Patient is NWB R UE/LE.  PMH including but not limited to CAD s/p CABG in 2017.    PT Comments    Pt performed bed mobility, transfers and WC/transport chair education.  Pt to d/c home this pm with support from his spouse.  Pt continues to progress well given functional limitations.    Follow Up Recommendations  Home health PT;Supervision/Assistance - 24 hour     Equipment Recommendations  Wheelchair (measurements PT);Wheelchair cushion (measurements PT);3in1 (PT);Other (comment)(WC with elevating leg rest and drop arm commode. )    Recommendations for Other Services       Precautions / Restrictions Precautions Precautions: Fall Required Braces or Orthoses: Other Brace/Splint Other Brace/Splint: casted R UE and LE  Restrictions Weight Bearing Restrictions: Yes RUE Weight Bearing: Non weight bearing RLE Weight Bearing: Non weight bearing    Mobility  Bed Mobility Overal bed mobility: Needs Assistance Bed Mobility: Supine to Sit     Supine to sit: Supervision     General bed mobility comments: Pt able to use R rail to reach across and move into seated position.    Transfers Overall transfer level: Needs assistance Equipment used: None Transfers: Squat Pivot Transfers Sit to Stand: Supervision Stand pivot transfers: Supervision Squat pivot transfers: Min guard     General transfer comment: Pt performed stand pivot to strong side x 2 trials from bed to transport chair and transport chair back to commode.  Pt required cues to make sure wife locks the transport chair..  Cues for  hand placement and to transfer to strong side.      Ambulation/Gait Ambulation/Gait assistance: (NT patient will be WC bound for 6-8 weeks.  )               Theme park manager mobility: Yes Wheelchair propulsion: Left lower extremity(Pt is transport chair so simulated use of light weight WC and showed patient where drive wheel would be.  ) Wheelchair parts: Needs assistance Distance: to and from bed<>bathroom.   Wheelchair Assistance Details (indicate cue type and reason): Pt reviewed brakes, armrest, leg rests and use of chair.  Pt only had transport chair present.  WIfe educated on how the transport chair works and how it should be pushed when he is in it.  Educated to use light weight WC in home to allow patientt to participate in propulsion.  Wife concerned use WC issued due to weight of her putting into and out of the car.    Modified Rankin (Stroke Patients Only)       Balance Overall balance assessment: Needs assistance Sitting-balance support: No upper extremity supported Sitting balance-Leahy Scale: Good       Standing balance-Leahy Scale: Poor                              Cognition Arousal/Alertness: Awake/alert Behavior During Therapy: WFL for tasks assessed/performed Overall Cognitive Status: Within Functional Limits for tasks assessed  Exercises      General Comments        Pertinent Vitals/Pain Pain Assessment: 0-10 Pain Score: 4  Pain Location: R UE Pain Descriptors / Indicators: Guarding;Discomfort Pain Intervention(s): Monitored during session;Repositioned    Home Living                      Prior Function            PT Goals (current goals can now be found in the care plan section) Acute Rehab PT Goals Patient Stated Goal: to go home Potential to Achieve Goals: Good Progress towards PT goals:  Progressing toward goals    Frequency    Min 3X/week      PT Plan Current plan remains appropriate    Co-evaluation              AM-PAC PT "6 Clicks" Daily Activity  Outcome Measure  Difficulty turning over in bed (including adjusting bedclothes, sheets and blankets)?: None Difficulty moving from lying on back to sitting on the side of the bed? : None Difficulty sitting down on and standing up from a chair with arms (e.g., wheelchair, bedside commode, etc,.)?: Unable Help needed moving to and from a bed to chair (including a wheelchair)?: A Little Help needed walking in hospital room?: A Lot Help needed climbing 3-5 steps with a railing? : Total 6 Click Score: 15    End of Session Equipment Utilized During Treatment: Gait belt Activity Tolerance: Patient tolerated treatment well Patient left: in chair;with call bell/phone within reach Nurse Communication: Mobility status PT Visit Diagnosis: Other abnormalities of gait and mobility (R26.89);Pain Pain - Right/Left: Left Pain - part of body: Shoulder;Arm;Leg     Time: 0539-7673 PT Time Calculation (min) (ACUTE ONLY): 20 min  Charges:  $Therapeutic Activity: 8-22 mins                    G Codes:       Governor Rooks, PTA pager [phone]    Cristela Blue 06/22/2017, 12:35 PM

## 2017-06-22 NOTE — Progress Notes (Signed)
Occupational Therapy Treatment Patient Details Name: Ryan Ward MRN: [id number] DOB: 1945-09-10 Today's Date: 06/22/2017    History of present illness Pt is a 72 y/o male admitted s/p fall from roof, resulting in Right elbow fracture dislocation (terrible triad) s/p ORIF, Right small finger proximal phalanx fracture s/p ORIF, and Right posterior malleolus and medial malleolus fractures s/p ORIF.  Patient is NWB R UE/LE.  PMH including but not limited to CAD s/p CABG in 2017.   OT comments  Pt. Able to complete family education/training with his g.friend for UB/LB dressing, bed mobility, and transfers to 3n1.  Both verbalized understanding and pts. G.friend states she is comfortable with his current level of assist needs which are min guard a.  D/c home likely today.   Follow Up Recommendations  Home health OT;Follow surgeon's recommendation for DC plan and follow-up therapies;Supervision/Assistance - 24 hour    Equipment Recommendations  3 in 1 bedside commode    Recommendations for Other Services      Precautions / Restrictions Precautions Precautions: Fall Required Braces or Orthoses: Other Brace/Splint Other Brace/Splint: casted R UE and LE  Restrictions RUE Weight Bearing: Non weight bearing RLE Weight Bearing: Non weight bearing       Mobility Bed Mobility Overal bed mobility: Needs Assistance Bed Mobility: Supine to Sit     Supine to sit: Supervision     General bed mobility comments: reports he will be sleeping in a recliner that can lay back or be upright. he inst. g.friend to move pillows and assist with blankets and he required no physical assist to bring b les to eob  Transfers Overall transfer level: Needs assistance Equipment used: None Transfers: Squat Pivot Transfers     Squat pivot transfers: Min guard          Balance                                           ADL either performed or assessed with clinical judgement   ADL  Overall ADL's : Needs assistance/impaired                 Upper Body Dressing : Cueing for compensatory techniques;Cueing for sequencing;With caregiver independent assisting;Set up Upper Body Dressing Details (indicate cue type and reason): provided demo and education for UB dressing, pts. g.friend who is primary caregiver was present and able to verbalize understanding of tech.  reapeated back Lower Body Dressing: Cueing for safety;Cueing for sequencing;Cueing for compensatory techniques;With caregiver independent assisting;Sitting/lateral leans;Sit to/from stand Lower Body Dressing Details (indicate cue type and reason): provided demo and education for LB dressing, pts. g.friend who is primary caregiver was present and ble to verbalize understanding of tech. repeated back.  Toilet Transfer: Min guard;BSC;Squat-pivot;With caregiver independent assisting Toilet Transfer Details (indicate cue type and reason): pt. able to sit eob and reach with LUE and pivot to L onto bsc with pts. g.friend stabalizing the 3n1, able to transfer back to eob to the R also with pts. g.friend stabalizing the 3n1   Toileting - Clothing Manipulation Details (indicate cue type and reason): pt. states he usues sids baths for pericare and g.friend will assist with setting those up for him     Functional mobility during ADLs: Min guard;Cueing for safety;Cueing for sequencing General ADL Comments: reviewed donning ub/lb garments to affected extremity first. double layering under wear and shorts prior to  standing to pull up and reviewed tech. for donning buttoned shirt and a pull over shirt, both including placing affected extremity first.      Vision       Perception     Praxis      Cognition Arousal/Alertness: Awake/alert Behavior During Therapy: WFL for tasks assessed/performed Overall Cognitive Status: Within Functional Limits for tasks assessed                                           Exercises     Shoulder Instructions       General Comments      Pertinent Vitals/ Pain       Pain Assessment: No/denies pain  Home Living                                          Prior Functioning/Environment              Frequency  Min 2X/week        Progress Toward Goals  OT Goals(current goals can now be found in the care plan section)  Progress towards OT goals: Progressing toward goals     Plan Other (comment)(pt. requesting anti-itch powder for his back)    Co-evaluation                 AM-PAC PT "6 Clicks" Daily Activity     Outcome Measure   Help from another person eating meals?: A Little Help from another person taking care of personal grooming?: A Little Help from another person toileting, which includes using toliet, bedpan, or urinal?: A Lot Help from another person bathing (including washing, rinsing, drying)?: A Lot Help from another person to put on and taking off regular upper body clothing?: A Little Help from another person to put on and taking off regular lower body clothing?: A Little 6 Click Score: 16    End of Session    OT Visit Diagnosis: Other abnormalities of gait and mobility (R26.89)   Activity Tolerance Patient tolerated treatment well   Patient Left in bed;with call bell/phone within reach;with family/visitor present(seated eob)   Nurse Communication          Time: 1655-3748 OT Time Calculation (min): 18 min  Charges: OT General Charges $OT Visit: 1 Visit OT Treatments $Self Care/Home Management : 8-22 mins   Janice Coffin, COTA/L 06/22/2017, 11:05 AM

## 2017-06-22 NOTE — Discharge Summary (Signed)
Patient ID: Ryan Ward MRN: [id number] DOB/AGE: [birth year] 72 y.o.  Admit date: 06/18/2017 Discharge date: 06/22/2017  Admission Diagnoses:  Active Problems:   Closed fracture dislocation of elbow, right, initial encounter   Fall on and from ladder causing accidental injury, initial encounter   Closed displaced fracture of proximal phalanx of right little finger   Closed displaced fracture of medial malleolus of right tibia   Closed displaced fracture of head of right radius   Fall from roof as cause of accidental injury   Discharge Diagnoses:  Same  Past Medical History:  Diagnosis Date  . Asthma    VERY MILD  . CAD (coronary artery disease)    a. 12/07/14 Inf STEMI/PCI: LAD 85p/m, 27m/d, LCX 80ost, RCA 30p/m, 45m/d, 100d (4.0x20 Promus Premier DES). Hosp course complicated by VF arrest, CGS, CHB req temp wire; b. 03/2015 Cath: LAD 85p/m, 95d, LCx 80ost, RCA 30-40p/m/d, RPDA 75, EF 30-35%, Nl CO; c. 04/06/2015 CABGx4 (LIMA->LAD, VG->Diag, Seq VG->prox RPDA->mid RPDA.  [name] Chronic systolic CHF (congestive heart failure) ([place])    a. 03/2015 Echo: EF 30-35%, diff HK.  [name] DVT (deep venous thrombosis) ([place]) 12/2014   RLE  . History of blood transfusion 04/2013   S/P CABG  . Hyperlipidemia   . Hypertension   . Hypertensive heart disease   . Ischemic cardiomyopathy    a. 03/2015 Echo: EF 30-35%, diff HK, mild MR, mod dil RA, PASP 46mmHg.  . Myocardial infarction ([place]) 12/2014  . OSA (obstructive sleep apnea)    a. not using CPAP at home, does have machine (06/20/2017)  . Paroxysmal atrial fibrillation ([place])    a. 12/2014 In setting of Inf MI->convertred spont.  . Pulmonary embolism ([place])    a. 12/2014 R PT and peroneal vein DVT and RUL PE-->Xarelto x 3 mos.    Surgeries: Procedure(s): OPEN REDUCTION ELBOW DISLOCATION WITH RADIAL HEAD REPLACEMENT OPEN REDUCTION INTERNAL FIXATION (ORIF) PROXIMAL PHALANX OPEN REDUCTION INTERNAL FIXATION (ORIF) ANKLE FRACTURE on 06/19/2017    Consultants: Treatment Team:  Shona Needles, MD  Discharged Condition: Improved  Hospital Course: Ryan Ward is an 72 y.o. male who was admitted 06/18/2017 for operative treatment of<principal problem not specified>. Patient has severe unremitting pain that affects sleep, daily activities, and work/hobbies. After pre-op clearance the patient was taken to the operating room on 06/19/2017 and underwent  Procedure(s): OPEN REDUCTION ELBOW DISLOCATION WITH RADIAL HEAD REPLACEMENT OPEN REDUCTION INTERNAL FIXATION (ORIF) PROXIMAL PHALANX OPEN REDUCTION INTERNAL FIXATION (ORIF) ANKLE FRACTURE.    Patient was given perioperative antibiotics:  Anti-infectives (From admission, onward)   Start     Dose/Rate Route Frequency Ordered Stop   06/19/17 2200  ceFAZolin (ANCEF) IVPB 2g/100 mL premix     2 g 200 mL/hr over 30 Minutes Intravenous Every 8 hours 06/19/17 1858 06/20/17 1530   06/19/17 1401  vancomycin (VANCOCIN) powder  Status:  Discontinued       As needed 06/19/17 1402 06/19/17 1759   06/18/17 2300  ceFAZolin (ANCEF) IVPB 1 g/50 mL premix  Status:  Discontinued     1 g 100 mL/hr over 30 Minutes Intravenous Every 8 hours 06/18/17 2259 06/19/17 1858       Patient was given sequential compression devices, early ambulation, and chemoprophylaxis to prevent DVT.  Patient benefited maximally from hospital stay and there were no complications.    Recent vital signs:  Patient Vitals for the past 24 hrs:  BP Temp Temp src Pulse Resp SpO2  06/22/17 0333 120/72 98.7 F (  37.1 C) Oral 71 14 99 %  06/21/17 2100 128/70 99.6 F (37.6 C) Oral 70 - 100 %  06/21/17 1434 113/64 (!) 97.4 F (36.3 C) Oral 66 - 100 %     Recent laboratory studies:  Recent Labs    06/20/17 0607  06/20/17 0954 06/21/17 0522  WBC 13.5*  --   --  10.8*  HGB 11.6*  --   --  10.3*  HCT 36.2*  --   --  32.0*  PLT 154  --   --  135*  NA  --   --  142 139  K  --   --  4.4 4.3  CL  --   --  107 109  CO2  --   --   28 25  BUN  --   --  25* 27*  CREATININE  --   --  1.53* 1.15  GLUCOSE  --   --  108* 98  CALCIUM  --    < > 8.4* 7.8*   < > = values in this interval not displayed.     Discharge Medications:   Allergies as of 06/22/2017   No Known Allergies     Medication List    TAKE these medications   aspirin EC 81 MG tablet Take 81 mg by mouth daily.   atorvastatin 80 MG tablet Commonly known as:  LIPITOR TAKE 1 TABLET (80 MG TOTAL) BY MOUTH DAILY AT 6 PM.   enoxaparin 40 MG/0.4ML injection Commonly known as:  LOVENOX Inject 0.4 mLs (40 mg total) into the skin daily.   famotidine 20 MG tablet Commonly known as:  PEPCID Take 20 mg by mouth 2 (two) times daily. Pt reports he takes prn depending on dietary intake   furosemide 40 MG tablet Commonly known as:  LASIX TAKE 1 TABLET BY MOUTH EVERY DAY   KLOR-CON M20 20 MEQ tablet Generic drug:  potassium chloride SA TAKE 2 TABLETS BY MOUTH EVERY DAY   levothyroxine 50 MCG tablet Commonly known as:  SYNTHROID, LEVOTHROID TAKE 1 TABLET (50 MCG TOTAL) BY MOUTH 2 (TWO) TIMES DAILY.   losartan 25 MG tablet Commonly known as:  COZAAR Take 1 tablet (25 mg total) by mouth daily.   methocarbamol 750 MG tablet Commonly known as:  ROBAXIN-750 Take 1 tablet (750 mg total) by mouth 4 (four) times daily.   multivitamin with minerals Tabs tablet Take 1 tablet by mouth daily.   oxyCODONE-acetaminophen 5-325 MG tablet Commonly known as:  PERCOCET/ROXICET Take 1-2 tablets by mouth every 6 (six) hours as needed for severe pain.   PROAIR HFA 108 (90 Base) MCG/ACT inhaler Generic drug:  albuterol Inhale 2 puffs into the lungs every 6 (six) hours as needed for wheezing.   Vitamin D3 5000 units Tabs Take 5,000 Units by mouth daily.            Durable Medical Equipment  (From admission, onward)        Start     Ordered   06/21/17 1432  For home use only DME standard manual wheelchair with seat cushion  Once    Comments:  Patient  suffers from right ankle ORIF, right elbow ORIF which impairs their ability to perform daily activities like   in the home.  A cane will not resolve  issue with performing activities of daily living. A wheelchair will allow patient to safely perform daily activities. Patient can safely propel the wheelchair in the home or has a caregiver  who can provide assistance.  Accessories: elevating leg rests (ELRs), wheel locks, extensions and anti-tippers.   06/21/17 1449   06/21/17 1232  For home use only DME 3 n 1  Once    Comments:  Need drop arm   06/21/17 1232      Diagnostic Studies: Dg Elbow 2 Views Right  Result Date: 06/19/2017 CLINICAL DATA:  72 year old male status post open reduction and radial head replacement of right elbow. EXAM: RIGHT ELBOW - 2 VIEW COMPARISON:  Intraoperative images 1400 hours the same day. Right elbow CT 06/18/2017. FINDINGS: AP and lateral views of the right elbow with overlying splint or cast material. There is a proximal right radius implant which appears to align with the distal right humerus capitellum. Satisfactory alignment of the proximal ulna. Small comminution fragment(s) again project in the anterior joint space along the antecubital fossa. No new osseous abnormality identified. IMPRESSION: 1. Satisfactory proximal right radius arthroplasty appearance following comminuted and displaced radial head fracture. 2. No new osseous abnormality identified. Electronically Signed   By: Genevie Ann M.D.   On: 06/19/2017 20:16   Dg Elbow Complete Right  Result Date: 06/19/2017 CLINICAL DATA:  72 year old male status post open reduction and radial head replacement of right elbow. EXAM: DG C-ARM 61-120 MIN; RIGHT ELBOW - COMPLETE 3+ VIEW COMPARISON:  Right elbow CT 06/18/2017. FINDINGS: Four intraoperative fluoroscopic spot views of the right elbow. Redemonstrated comminuted fracture of the right radial head. On the final 2 images there is a proximal right radius implant which  appears to align with the distal right humerus capitellum. Satisfactory alignment of the proximal ulna. Small comminution fragment(s) again project in the anterior joint space along the antecubital fossa. No new osseous abnormality identified IMPRESSION: ORIF proximal right radius fracture with radial arthroplasty placement. Electronically Signed   By: Genevie Ann M.D.   On: 06/19/2017 20:21   Dg Elbow Complete Right  Result Date: 06/18/2017 CLINICAL DATA:  Acute RIGHT elbow pain following fall. Initial encounter. EXAM: RIGHT ELBOW - COMPLETE 3+ VIEW COMPARISON:  None. FINDINGS: A impacted comminuted radial head/neck fracture is noted. A bony fragment overlying the joint and probable bony fragment anterior to the distal humerus on the LATERAL view are of uncertain donor sites. Soft tissue swelling is present. No dislocation. IMPRESSION: Impacted comminuted radial head/neck fracture. Bony fragments overlying the joint and anterior to the distal humerus on the LATERAL view of uncertain donor sites. Soft tissue swelling. Electronically Signed   By: Margarette Canada M.D.   On: 06/18/2017 19:10   Dg Forearm Right  Result Date: 06/18/2017 CLINICAL DATA:  Acute RIGHT forearm pain following fall. Initial encounter. Is EXAM: RIGHT FOREARM - 2 VIEW COMPARISON:  None. FINDINGS: A fracture of the radial head and neck noted. Bony densities overlying the joint and anterior to the distal humerus on the LATERAL view are of uncertain donor origin. Soft tissue swelling is present. IMPRESSION: Radial neck and head fracture. Bony densities overlying the joint and anterior to the distal humerus on the LATERAL view - from uncertain donor sites. Electronically Signed   By: Margarette Canada M.D.   On: 06/18/2017 19:17   Dg Ankle 2 Views Right  Result Date: 06/19/2017 CLINICAL DATA:  72 year old male undergoing ORIF right ankle fracture. EXAM: RIGHT ANKLE - 2 VIEW COMPARISON:  Right ankle CT 06/18/2017 and earlier. FINDINGS: 4 intraoperative  fluoroscopic spot views of the right ankle. The final 2 images demonstrate a posteriorly situated plate with interlocking cortical screws through  the posterior malleolus, and additional long cortical screws traversing the medial malleolus. Hardware appears intact. Near anatomic alignment. IMPRESSION: Distal right tibia ORIF with no adverse features. Electronically Signed   By: Genevie Ann M.D.   On: 06/19/2017 20:23   Dg Ankle Complete Right  Result Date: 06/18/2017 CLINICAL DATA:  Acute ankle pain following fall.  Initial encounter. EXAM: RIGHT ANKLE - COMPLETE 3+ VIEW COMPARISON:  None. FINDINGS: A transverse fracture of the medial malleolus is noted with 3 mm distraction. A posterior malleolar/tibial fracture is present. Ankle effusion and soft tissue swelling noted. No dislocation. IMPRESSION: Medial and posterior malleolar tibial fractures Electronically Signed   By: Margarette Canada M.D.   On: 06/18/2017 19:07   Ct Head Wo Contrast  Result Date: 06/18/2017 CLINICAL DATA:  Status post fall through roof onto a concrete floor today. Initial encounter. EXAM: CT HEAD WITHOUT CONTRAST CT CERVICAL SPINE WITHOUT CONTRAST TECHNIQUE: Multidetector CT imaging of the head and cervical spine was performed following the standard protocol without intravenous contrast. Multiplanar CT image reconstructions of the cervical spine were also generated. COMPARISON:  None. FINDINGS: CT HEAD FINDINGS Brain: No evidence of acute infarction, hemorrhage, hydrocephalus, extra-axial collection or mass lesion/mass effect. Vascular: Atherosclerosis noted. Skull: Intact. Sinuses/Orbits: Scattered ethmoid air cell disease is noted. There is mucosal thickening in the inferior aspect of both the right and left maxillary sinuses. Other: None. CT CERVICAL SPINE FINDINGS Alignment: Maintained. Skull base and vertebrae: No acute fracture. No primary bone lesion or focal pathologic process. Soft tissues and spinal canal: No prevertebral fluid or  swelling. No visible canal hematoma. Disc levels: Severe loss of disc space height and endplate spurring are seen at C5-6 and C6-7. Scattered facet degenerative disease appears worst on the right at C3-4 and C4-5. Upper chest: Clear. Other: None. IMPRESSION: No acute abnormality head or cervical spine. Atherosclerosis. Cervical spondylosis appearing worst at C5-6 and C6-7. Sinus disease. Electronically Signed   By: Inge Rise M.D.   On: 06/18/2017 19:57   Ct Cervical Spine Wo Contrast  Result Date: 06/18/2017 CLINICAL DATA:  Status post fall through roof onto a concrete floor today. Initial encounter. EXAM: CT HEAD WITHOUT CONTRAST CT CERVICAL SPINE WITHOUT CONTRAST TECHNIQUE: Multidetector CT imaging of the head and cervical spine was performed following the standard protocol without intravenous contrast. Multiplanar CT image reconstructions of the cervical spine were also generated. COMPARISON:  None. FINDINGS: CT HEAD FINDINGS Brain: No evidence of acute infarction, hemorrhage, hydrocephalus, extra-axial collection or mass lesion/mass effect. Vascular: Atherosclerosis noted. Skull: Intact. Sinuses/Orbits: Scattered ethmoid air cell disease is noted. There is mucosal thickening in the inferior aspect of both the right and left maxillary sinuses. Other: None. CT CERVICAL SPINE FINDINGS Alignment: Maintained. Skull base and vertebrae: No acute fracture. No primary bone lesion or focal pathologic process. Soft tissues and spinal canal: No prevertebral fluid or swelling. No visible canal hematoma. Disc levels: Severe loss of disc space height and endplate spurring are seen at C5-6 and C6-7. Scattered facet degenerative disease appears worst on the right at C3-4 and C4-5. Upper chest: Clear. Other: None. IMPRESSION: No acute abnormality head or cervical spine. Atherosclerosis. Cervical spondylosis appearing worst at C5-6 and C6-7. Sinus disease. Electronically Signed   By: Inge Rise M.D.   On:  06/18/2017 19:57   Ct Ankle Right Wo Contrast  Result Date: 06/19/2017 CLINICAL DATA:  Patient fell off roof.  Right ankle pain. EXAM: CT OF THE RIGHT ANKLE WITHOUT CONTRAST TECHNIQUE: Multidetector CT imaging of  the right ankle was performed according to the standard protocol. Multiplanar CT image reconstructions were also generated. COMPARISON:  Same day ankle radiographs. FINDINGS: Bones/Joint/Cartilage The following acute fractures are noted: 1. Slightly comminuted avulsion fracture off the medial aspect of the second metatarsal base along the expected location of the Lisfranc ligament compatible with a Lisfranc fracture, series 3/91. 2. Nondisplaced fractures along the plantar base of the first, third and fourth metatarsals are also noted, series 3/99. 3. Acute transverse fracture of the medial malleolus, series 7/53. 4. Acute coronal oblique fracture of the posterior malleolus with 6 mm of dorsal displacement of the fracture fragment, series 3/37. 5. Avulsed 12 x 5 12 mm fracture off the anterolateral aspect of the tibial plafond, series 3/37. 6. Tiny flake avulsion off the lateral talus series 7/61 and 63. 7. Tiny 4 mm avulsion off the anterior dorsal aspect of the medial cuneiform, series 8/40 Other nonacute findings would include osteoarthritic spurring across the dorsum of the talonavicular joint with small ossific density seen adjacent to the dorsum of the anterior talus. The subtalar joint is intact. Accessory ossicles are noted adjacent to the plantar base of the case cuboid. Subchondral degenerative cystic change is noted of the third and fourth metatarsal bases. Ligaments Suboptimally assessed by CT. Muscles and Tendons Intact Achilles, peroneal, posteromedial and extensor tendons crossing the ankle joint. Intact plantar fascia. Calcaneal enthesopathy is noted along the plantar aspect of the calcaneus. Soft tissues Moderate periarticular soft tissue swelling about the malleoli. Tibiotalar joint  effusion. IMPRESSION: The following acute fractures are noted: 1. Slightly comminuted avulsion fracture off the medial aspect of the second metatarsal base along the expected location of the Lisfranc ligament compatible with a Lisfranc fracture, series 3/91. 2. Nondisplaced fractures along the plantar base of the first, third and fourth metatarsals are also noted, series 3/99. 3. Acute transverse fracture of the medial malleolus, series 7/53. 4. Acute coronal oblique fracture of the posterior malleolus, series 3/37 with 6 mm of dorsal displacement of the fracture fragment. 5. Avulsed 12 x 5 12 mm fracture off the anterolateral aspect of the tibial plafond, series 3/37. 6. Tiny avulsion off the lateral talus likely along the course of the talofibular ligament, series 7/61 and 63. 7. 4 mm avulsed fracture fragment off the anterior dorsal aspect of the medial cuneiform, series 8/40. Additional findings of small ankle joint effusion. Periarticular soft tissue swelling and calcaneal enthesopathy. Electronically Signed   By: Ashley Royalty M.D.   On: 06/19/2017 00:05   Dg Pelvis Portable  Result Date: 06/18/2017 CLINICAL DATA:  Acute pelvic pain following fall. Initial encounter. EXAM: PORTABLE PELVIS 1-2 VIEWS COMPARISON:  None. FINDINGS: There is no evidence of pelvic fracture or diastasis. No pelvic bone lesions are seen. IMPRESSION: Negative. Electronically Signed   By: Margarette Canada M.D.   On: 06/18/2017 19:13   Ct Elbow Right Wo Contrast  Result Date: 06/18/2017 CLINICAL DATA:  Patient fell off roof onto concrete driveway today. Fracture radial head and neck. EXAM: CT OF THE LOWER RIGHT EXTREMITY WITHOUT CONTRAST TECHNIQUE: Multidetector CT imaging of the right lower extremity was performed according to the standard protocol. COMPARISON:  Same day elbow radiographs FINDINGS: Bones/Joint/Cartilage 1. 9 x 2 x 7 mm intra-articular fracture fragment is seen the along the medial volar aspect of the elbow joint. A  donor site is not definitively identified but given slight irregularity of the coronoid, suspect that the fracture involves or is off the coronoid process, series 3/65 and series  10/26. 2. An additional smaller rounded ossific density is noted just caudal to the coronoid process measuring 3 mm, series 3/59. 3. Comminuted slightly depressed fracture of the radial head and neck along the volar and ulnar aspect is identified compatible with a Mason type 2 fracture with slight caudal/distal displacement of fracture fragments greater than 2 mm. 4. Two avulsed fracture fragments are seen just anterior to the coronoid fossa of the humerus, the larger measuring 12 x 5 x 6 mm with a smaller 5 x 5 x 5 mm avulsed fracture fragment noted. A donor site is not definitively identified. Ligaments Suboptimally assessed by CT. Muscles and Tendons Soft tissue swelling without intramuscular hemorrhage or atrophy. Soft tissues No focal fluid collection or hematoma. Posttraumatic soft tissue swelling about the elbow. Positive elbow joint effusion with elevation of the fat pads. IMPRESSION: 1. 9 x 2 x 7 mm intra-articular fracture fragment is seen the along the medial volar aspect of the elbow joint. A donor site is not definitively identified but given slight irregularity of the coronoid, suspect that the fracture involves or is off the coronoid process, series 3/65 and series 10/26. 2. An additional smaller rounded ossific density is noted just caudal to the coronoid process measuring 3 mm, series 3/59. 3. Comminuted slightly depressed fracture of the radial head and neck along the volar and ulnar aspect is identified compatible with a Mason type 2 fracture with slight caudal/distal displacement of fracture fragments greater than 2 mm. 4. Two avulsed fracture fragments are seen just anterior to the coronoid fossa of the humerus, the larger measuring 12 x 5 x 6 mm with a smaller 5 x 5 x 5 mm avulsed fracture fragment noted. A donor site  is not definitively identified. 5. Generalized soft tissue swelling about the elbow. Positive elbow joint effusion. Electronically Signed   By: Ashley Royalty M.D.   On: 06/18/2017 23:48   Dg Chest Portable 1 View  Result Date: 06/18/2017 CLINICAL DATA:  Fall from 20 feet. EXAM: PORTABLE CHEST 1 VIEW COMPARISON:  06/16/2015 and prior exams FINDINGS: Cardiomegaly and CABG changes noted. There is no evidence of focal airspace disease, pulmonary edema, suspicious pulmonary nodule/mass, pleural effusion, or pneumothorax. No acute bony abnormalities are identified. IMPRESSION: Cardiomegaly without evidence of acute cardiopulmonary disease. Electronically Signed   By: Margarette Canada M.D.   On: 06/18/2017 19:11   Dg Ankle Right Port  Result Date: 06/19/2017 CLINICAL DATA:  ORIF of right ankle. EXAM: PORTABLE RIGHT ANKLE - 2 VIEW COMPARISON:  None. FINDINGS: Portable views of the right ankle were acquired status post ORIF of the right ankle. Two fixation screws project across the medial malleolus. Plate and screw fixation hardware are also noted along the dorsum of the distal tibial diaphysis and metaphysis with single screw traversing the tibial epiphysis. Ankle mortise appears congruent. Subtalar joint is maintained. Calcaneal enthesophyte is seen along the plantar aspect. IMPRESSION: Fixation of comminuted distal tibial fractures as above Electronically Signed   By: Ashley Royalty M.D.   On: 06/19/2017 20:04   Dg Hand Complete Right  Result Date: 06/18/2017 CLINICAL DATA:  Acute RIGHT hand pain following fall. Initial encounter. EXAM: RIGHT HAND - COMPLETE 3+ VIEW COMPARISON:  None. FINDINGS: A mildly comminuted fracture of the proximal aspect of the little finger proximal phalanx is noted. No other fracture, subluxation or dislocation. Soft tissue swelling is present. IMPRESSION: Mildly comminuted fracture of the little finger proximal phalanx. Electronically Signed   By: Cleatis Polka.D.  On: 06/18/2017 19:15    Dg Finger Little Right  Result Date: 06/19/2017 CLINICAL DATA:  72 year old male undergoing ORIF of right 5th proximal phalanx fracture. EXAM: RIGHT LITTLE FINGER 2+V COMPARISON:  Right hand series 06/18/2017. FINDINGS: Three intraoperative fluoroscopic spot views of the right 5th finger demonstrate lateral approach plate with multiple interlocking cortical screws traversing the comminuted proximal phalanx fracture. Improved fracture alignment. The hardware appears intact. IMPRESSION: ORIF right hand 5th proximal phalanx with no adverse features. Electronically Signed   By: Genevie Ann M.D.   On: 06/19/2017 20:25   Dg Finger Little Right  Result Date: 06/19/2017 CLINICAL DATA:  72 year old male status post ORIF of right 5th proximal phalanx. EXAM: RIGHT LITTLE FINGER 2+V COMPARISON:  Intraoperative images 1505 hours today, and earlier FINDINGS: A lateral plate with multiple interlocking cortical screws have been placed across the comminuted fracture of the right 5th proximal phalanx. Improved alignment of fracture fragments. The hardware appears intact. Overlying splint or cast material. No new osseous abnormality identified. IMPRESSION: Satisfactory postoperative appearance of right 5th proximal phalanx ORIF. Electronically Signed   By: Genevie Ann M.D.   On: 06/19/2017 20:18   Dg C-arm 1-60 Min  Result Date: 06/19/2017 CLINICAL DATA:  72 year old male status post open reduction and radial head replacement of right elbow. EXAM: DG C-ARM 61-120 MIN; RIGHT ELBOW - COMPLETE 3+ VIEW COMPARISON:  Right elbow CT 06/18/2017. FINDINGS: Four intraoperative fluoroscopic spot views of the right elbow. Redemonstrated comminuted fracture of the right radial head. On the final 2 images there is a proximal right radius implant which appears to align with the distal right humerus capitellum. Satisfactory alignment of the proximal ulna. Small comminution fragment(s) again project in the anterior joint space along the  antecubital fossa. No new osseous abnormality identified IMPRESSION: ORIF proximal right radius fracture with radial arthroplasty placement. Electronically Signed   By: Genevie Ann M.D.   On: 06/19/2017 20:21   Dg C-arm 1-60 Min  Result Date: 06/19/2017 CLINICAL DATA:  72 year old male status post open reduction and radial head replacement of right elbow. EXAM: DG C-ARM 61-120 MIN; RIGHT ELBOW - COMPLETE 3+ VIEW COMPARISON:  Right elbow CT 06/18/2017. FINDINGS: Four intraoperative fluoroscopic spot views of the right elbow. Redemonstrated comminuted fracture of the right radial head. On the final 2 images there is a proximal right radius implant which appears to align with the distal right humerus capitellum. Satisfactory alignment of the proximal ulna. Small comminution fragment(s) again project in the anterior joint space along the antecubital fossa. No new osseous abnormality identified IMPRESSION: ORIF proximal right radius fracture with radial arthroplasty placement. Electronically Signed   By: Genevie Ann M.D.   On: 06/19/2017 20:21   Dg C-arm 1-60 Min  Result Date: 06/19/2017 CLINICAL DATA:  72 year old male status post open reduction and radial head replacement of right elbow. EXAM: DG C-ARM 61-120 MIN; RIGHT ELBOW - COMPLETE 3+ VIEW COMPARISON:  Right elbow CT 06/18/2017. FINDINGS: Four intraoperative fluoroscopic spot views of the right elbow. Redemonstrated comminuted fracture of the right radial head. On the final 2 images there is a proximal right radius implant which appears to align with the distal right humerus capitellum. Satisfactory alignment of the proximal ulna. Small comminution fragment(s) again project in the anterior joint space along the antecubital fossa. No new osseous abnormality identified IMPRESSION: ORIF proximal right radius fracture with radial arthroplasty placement. Electronically Signed   By: Genevie Ann M.D.   On: 06/19/2017 20:21    Disposition: Discharge  disposition: 01-Home or  Self Care       Discharge Instructions    Call MD / Call 911   Complete by:  As directed    If you experience chest pain or shortness of breath, CALL 911 and be transported to the hospital emergency room.  If you develope a fever above 101 F, pus (white drainage) or increased drainage or redness at the wound, or calf pain, call your surgeon's office.   Constipation Prevention   Complete by:  As directed    Drink plenty of fluids.  Prune juice and/or coffee may be helpful.  You may use a stool softener, such as Colace (over the counter) 100 mg twice a day.  Use MiraLax (over the counter) for constipation as needed but this may take several days to work.  Mag Citrate --OR-- Milk of Magnesia may also be used but follow directions on the label.   Diet - low sodium heart healthy   Complete by:  As directed    Discharge instructions   Complete by:  As directed    Diet: As you were doing prior to hospitalization   Activity:  Increase activity slowly as tolerated                  No lifting or driving for 6 weeks No weight bearing right arm or leg  Keep right leg elevated on 2-3 pillows Elevate right arm as needed for swelling  Shower: no showers until the splints are removed   May shower without a dressing once there is no drainage from your wound.                 Do NOT wash over the wound.                 Dressing:  You do not need to change your dressings/ splints until follow up                    Then change the dressing as Dr Doreatha Martin directs                      Weight Bearing:  Weight bearing as tolerated as taught in physical therapy.  No weight bearing right arm or leg  To prevent constipation: you may use a stool softener such as -               Colace ( over the counter) 100 mg by mouth twice a day                Drink plenty of fluids ( prune juice may be helpful) and high fiber foods                Miralax ( over the counter) for constipation as needed.    Precautions:   If you experience chest pain or shortness of breath - call 911 immediately               For transfer to the hospital emergency department!!               If you develop a fever greater that 101 F, purulent drainage from wound,                             increased redness or drainage from wound, or calf pain -- Call the office.  Follow- Up Appointment:  Please  call for an appointment to be seen on 06/25/2017   Increase activity slowly as tolerated   Complete by:  As directed    Patient may shower   Complete by:  As directed    No showers until insturcted.  Once the dressing is removed you may shower without a dressing once there is no drainage.  Do not wash over the wound.  If drainage remains, cover wound with plastic wrap and then shower      Follow-up Information    Haddix, Thomasene Lot, MD. Schedule an appointment as soon as possible for a visit on 06/25/2017.   Specialty:  Orthopedic Surgery Contact information: [address] [place] [address] [phone]            Signed: Nehemiah Massed 06/22/2017, 11:42 AM [phone]

## 2017-06-25 DIAGNOSIS — Z7901 Long term (current) use of anticoagulants: Secondary | ICD-10-CM | POA: Diagnosis not present

## 2017-06-25 DIAGNOSIS — I480 Paroxysmal atrial fibrillation: Secondary | ICD-10-CM | POA: Diagnosis not present

## 2017-06-25 DIAGNOSIS — I255 Ischemic cardiomyopathy: Secondary | ICD-10-CM | POA: Diagnosis not present

## 2017-06-25 DIAGNOSIS — Z9181 History of falling: Secondary | ICD-10-CM | POA: Diagnosis not present

## 2017-06-25 DIAGNOSIS — S52121D Displaced fracture of head of right radius, subsequent encounter for closed fracture with routine healing: Secondary | ICD-10-CM | POA: Diagnosis not present

## 2017-06-25 DIAGNOSIS — G4733 Obstructive sleep apnea (adult) (pediatric): Secondary | ICD-10-CM | POA: Diagnosis not present

## 2017-06-25 DIAGNOSIS — S53431D Radial collateral ligament sprain of right elbow, subsequent encounter: Secondary | ICD-10-CM | POA: Diagnosis not present

## 2017-06-25 DIAGNOSIS — I5022 Chronic systolic (congestive) heart failure: Secondary | ICD-10-CM | POA: Diagnosis not present

## 2017-06-25 DIAGNOSIS — I110 Hypertensive heart disease with heart failure: Secondary | ICD-10-CM | POA: Diagnosis not present

## 2017-06-25 DIAGNOSIS — W11XXXD Fall on and from ladder, subsequent encounter: Secondary | ICD-10-CM | POA: Diagnosis not present

## 2017-06-25 DIAGNOSIS — I2510 Atherosclerotic heart disease of native coronary artery without angina pectoris: Secondary | ICD-10-CM | POA: Diagnosis not present

## 2017-06-25 DIAGNOSIS — S62611D Displaced fracture of proximal phalanx of left index finger, subsequent encounter for fracture with routine healing: Secondary | ICD-10-CM | POA: Diagnosis not present

## 2017-06-25 DIAGNOSIS — S42401D Unspecified fracture of lower end of right humerus, subsequent encounter for fracture with routine healing: Secondary | ICD-10-CM | POA: Diagnosis not present

## 2017-06-25 DIAGNOSIS — S82841D Displaced bimalleolar fracture of right lower leg, subsequent encounter for closed fracture with routine healing: Secondary | ICD-10-CM | POA: Diagnosis not present

## 2017-06-25 DIAGNOSIS — Z7982 Long term (current) use of aspirin: Secondary | ICD-10-CM | POA: Diagnosis not present

## 2017-06-25 DIAGNOSIS — Z86718 Personal history of other venous thrombosis and embolism: Secondary | ICD-10-CM | POA: Diagnosis not present

## 2017-06-25 DIAGNOSIS — Z86711 Personal history of pulmonary embolism: Secondary | ICD-10-CM | POA: Diagnosis not present

## 2017-06-25 DIAGNOSIS — S62616D Displaced fracture of proximal phalanx of right little finger, subsequent encounter for fracture with routine healing: Secondary | ICD-10-CM | POA: Diagnosis not present

## 2017-06-28 DIAGNOSIS — S53431D Radial collateral ligament sprain of right elbow, subsequent encounter: Secondary | ICD-10-CM | POA: Diagnosis not present

## 2017-06-28 DIAGNOSIS — S42401D Unspecified fracture of lower end of right humerus, subsequent encounter for fracture with routine healing: Secondary | ICD-10-CM | POA: Diagnosis not present

## 2017-06-28 DIAGNOSIS — S62611D Displaced fracture of proximal phalanx of left index finger, subsequent encounter for fracture with routine healing: Secondary | ICD-10-CM | POA: Diagnosis not present

## 2017-06-28 DIAGNOSIS — S82841D Displaced bimalleolar fracture of right lower leg, subsequent encounter for closed fracture with routine healing: Secondary | ICD-10-CM | POA: Diagnosis not present

## 2017-06-28 DIAGNOSIS — I2510 Atherosclerotic heart disease of native coronary artery without angina pectoris: Secondary | ICD-10-CM | POA: Diagnosis not present

## 2017-06-28 DIAGNOSIS — I255 Ischemic cardiomyopathy: Secondary | ICD-10-CM | POA: Diagnosis not present

## 2017-07-01 DIAGNOSIS — S53431D Radial collateral ligament sprain of right elbow, subsequent encounter: Secondary | ICD-10-CM | POA: Diagnosis not present

## 2017-07-01 DIAGNOSIS — S82841D Displaced bimalleolar fracture of right lower leg, subsequent encounter for closed fracture with routine healing: Secondary | ICD-10-CM | POA: Diagnosis not present

## 2017-07-01 DIAGNOSIS — I255 Ischemic cardiomyopathy: Secondary | ICD-10-CM | POA: Diagnosis not present

## 2017-07-01 DIAGNOSIS — S62611D Displaced fracture of proximal phalanx of left index finger, subsequent encounter for fracture with routine healing: Secondary | ICD-10-CM | POA: Diagnosis not present

## 2017-07-01 DIAGNOSIS — S42401D Unspecified fracture of lower end of right humerus, subsequent encounter for fracture with routine healing: Secondary | ICD-10-CM | POA: Diagnosis not present

## 2017-07-01 DIAGNOSIS — I2510 Atherosclerotic heart disease of native coronary artery without angina pectoris: Secondary | ICD-10-CM | POA: Diagnosis not present

## 2017-07-02 DIAGNOSIS — S8251XD Displaced fracture of medial malleolus of right tibia, subsequent encounter for closed fracture with routine healing: Secondary | ICD-10-CM | POA: Diagnosis not present

## 2017-07-02 DIAGNOSIS — S52121D Displaced fracture of head of right radius, subsequent encounter for closed fracture with routine healing: Secondary | ICD-10-CM | POA: Diagnosis not present

## 2017-07-02 DIAGNOSIS — S62616D Displaced fracture of proximal phalanx of right little finger, subsequent encounter for fracture with routine healing: Secondary | ICD-10-CM | POA: Diagnosis not present

## 2017-07-05 DIAGNOSIS — I255 Ischemic cardiomyopathy: Secondary | ICD-10-CM | POA: Diagnosis not present

## 2017-07-05 DIAGNOSIS — S82841D Displaced bimalleolar fracture of right lower leg, subsequent encounter for closed fracture with routine healing: Secondary | ICD-10-CM | POA: Diagnosis not present

## 2017-07-05 DIAGNOSIS — S42401D Unspecified fracture of lower end of right humerus, subsequent encounter for fracture with routine healing: Secondary | ICD-10-CM | POA: Diagnosis not present

## 2017-07-05 DIAGNOSIS — S53431D Radial collateral ligament sprain of right elbow, subsequent encounter: Secondary | ICD-10-CM | POA: Diagnosis not present

## 2017-07-05 DIAGNOSIS — S62611D Displaced fracture of proximal phalanx of left index finger, subsequent encounter for fracture with routine healing: Secondary | ICD-10-CM | POA: Diagnosis not present

## 2017-07-05 DIAGNOSIS — I2510 Atherosclerotic heart disease of native coronary artery without angina pectoris: Secondary | ICD-10-CM | POA: Diagnosis not present

## 2017-07-10 DIAGNOSIS — S53431D Radial collateral ligament sprain of right elbow, subsequent encounter: Secondary | ICD-10-CM | POA: Diagnosis not present

## 2017-07-10 DIAGNOSIS — I255 Ischemic cardiomyopathy: Secondary | ICD-10-CM | POA: Diagnosis not present

## 2017-07-10 DIAGNOSIS — I2510 Atherosclerotic heart disease of native coronary artery without angina pectoris: Secondary | ICD-10-CM | POA: Diagnosis not present

## 2017-07-10 DIAGNOSIS — S62611D Displaced fracture of proximal phalanx of left index finger, subsequent encounter for fracture with routine healing: Secondary | ICD-10-CM | POA: Diagnosis not present

## 2017-07-10 DIAGNOSIS — S42401D Unspecified fracture of lower end of right humerus, subsequent encounter for fracture with routine healing: Secondary | ICD-10-CM | POA: Diagnosis not present

## 2017-07-10 DIAGNOSIS — S82841D Displaced bimalleolar fracture of right lower leg, subsequent encounter for closed fracture with routine healing: Secondary | ICD-10-CM | POA: Diagnosis not present

## 2017-07-16 DIAGNOSIS — S53431D Radial collateral ligament sprain of right elbow, subsequent encounter: Secondary | ICD-10-CM | POA: Diagnosis not present

## 2017-07-16 DIAGNOSIS — I2510 Atherosclerotic heart disease of native coronary artery without angina pectoris: Secondary | ICD-10-CM | POA: Diagnosis not present

## 2017-07-16 DIAGNOSIS — S42401D Unspecified fracture of lower end of right humerus, subsequent encounter for fracture with routine healing: Secondary | ICD-10-CM | POA: Diagnosis not present

## 2017-07-16 DIAGNOSIS — S62611D Displaced fracture of proximal phalanx of left index finger, subsequent encounter for fracture with routine healing: Secondary | ICD-10-CM | POA: Diagnosis not present

## 2017-07-16 DIAGNOSIS — I255 Ischemic cardiomyopathy: Secondary | ICD-10-CM | POA: Diagnosis not present

## 2017-07-16 DIAGNOSIS — S82841D Displaced bimalleolar fracture of right lower leg, subsequent encounter for closed fracture with routine healing: Secondary | ICD-10-CM | POA: Diagnosis not present

## 2017-07-19 DIAGNOSIS — S62611D Displaced fracture of proximal phalanx of left index finger, subsequent encounter for fracture with routine healing: Secondary | ICD-10-CM | POA: Diagnosis not present

## 2017-07-19 DIAGNOSIS — S42401D Unspecified fracture of lower end of right humerus, subsequent encounter for fracture with routine healing: Secondary | ICD-10-CM | POA: Diagnosis not present

## 2017-07-19 DIAGNOSIS — S82841D Displaced bimalleolar fracture of right lower leg, subsequent encounter for closed fracture with routine healing: Secondary | ICD-10-CM | POA: Diagnosis not present

## 2017-07-19 DIAGNOSIS — I255 Ischemic cardiomyopathy: Secondary | ICD-10-CM | POA: Diagnosis not present

## 2017-07-19 DIAGNOSIS — I2510 Atherosclerotic heart disease of native coronary artery without angina pectoris: Secondary | ICD-10-CM | POA: Diagnosis not present

## 2017-07-19 DIAGNOSIS — S53431D Radial collateral ligament sprain of right elbow, subsequent encounter: Secondary | ICD-10-CM | POA: Diagnosis not present

## 2017-07-22 ENCOUNTER — Other Ambulatory Visit: Payer: Self-pay | Admitting: Cardiology

## 2017-07-23 DIAGNOSIS — S82841D Displaced bimalleolar fracture of right lower leg, subsequent encounter for closed fracture with routine healing: Secondary | ICD-10-CM | POA: Diagnosis not present

## 2017-07-23 DIAGNOSIS — S42401D Unspecified fracture of lower end of right humerus, subsequent encounter for fracture with routine healing: Secondary | ICD-10-CM | POA: Diagnosis not present

## 2017-07-23 DIAGNOSIS — S62611D Displaced fracture of proximal phalanx of left index finger, subsequent encounter for fracture with routine healing: Secondary | ICD-10-CM | POA: Diagnosis not present

## 2017-07-23 DIAGNOSIS — S53431D Radial collateral ligament sprain of right elbow, subsequent encounter: Secondary | ICD-10-CM | POA: Diagnosis not present

## 2017-07-23 DIAGNOSIS — I2510 Atherosclerotic heart disease of native coronary artery without angina pectoris: Secondary | ICD-10-CM | POA: Diagnosis not present

## 2017-07-23 DIAGNOSIS — I255 Ischemic cardiomyopathy: Secondary | ICD-10-CM | POA: Diagnosis not present

## 2017-07-24 DIAGNOSIS — I255 Ischemic cardiomyopathy: Secondary | ICD-10-CM | POA: Diagnosis not present

## 2017-07-24 DIAGNOSIS — S42401D Unspecified fracture of lower end of right humerus, subsequent encounter for fracture with routine healing: Secondary | ICD-10-CM | POA: Diagnosis not present

## 2017-07-24 DIAGNOSIS — S62611D Displaced fracture of proximal phalanx of left index finger, subsequent encounter for fracture with routine healing: Secondary | ICD-10-CM | POA: Diagnosis not present

## 2017-07-24 DIAGNOSIS — S53431D Radial collateral ligament sprain of right elbow, subsequent encounter: Secondary | ICD-10-CM | POA: Diagnosis not present

## 2017-07-24 DIAGNOSIS — S82841D Displaced bimalleolar fracture of right lower leg, subsequent encounter for closed fracture with routine healing: Secondary | ICD-10-CM | POA: Diagnosis not present

## 2017-07-24 DIAGNOSIS — I2510 Atherosclerotic heart disease of native coronary artery without angina pectoris: Secondary | ICD-10-CM | POA: Diagnosis not present

## 2017-07-30 DIAGNOSIS — S82841D Displaced bimalleolar fracture of right lower leg, subsequent encounter for closed fracture with routine healing: Secondary | ICD-10-CM | POA: Diagnosis not present

## 2017-07-30 DIAGNOSIS — S53431D Radial collateral ligament sprain of right elbow, subsequent encounter: Secondary | ICD-10-CM | POA: Diagnosis not present

## 2017-07-30 DIAGNOSIS — S42401D Unspecified fracture of lower end of right humerus, subsequent encounter for fracture with routine healing: Secondary | ICD-10-CM | POA: Diagnosis not present

## 2017-07-30 DIAGNOSIS — I255 Ischemic cardiomyopathy: Secondary | ICD-10-CM | POA: Diagnosis not present

## 2017-07-30 DIAGNOSIS — I2510 Atherosclerotic heart disease of native coronary artery without angina pectoris: Secondary | ICD-10-CM | POA: Diagnosis not present

## 2017-07-30 DIAGNOSIS — S62611D Displaced fracture of proximal phalanx of left index finger, subsequent encounter for fracture with routine healing: Secondary | ICD-10-CM | POA: Diagnosis not present

## 2017-08-02 DIAGNOSIS — S82841D Displaced bimalleolar fracture of right lower leg, subsequent encounter for closed fracture with routine healing: Secondary | ICD-10-CM | POA: Diagnosis not present

## 2017-08-02 DIAGNOSIS — I2510 Atherosclerotic heart disease of native coronary artery without angina pectoris: Secondary | ICD-10-CM | POA: Diagnosis not present

## 2017-08-02 DIAGNOSIS — S53431D Radial collateral ligament sprain of right elbow, subsequent encounter: Secondary | ICD-10-CM | POA: Diagnosis not present

## 2017-08-02 DIAGNOSIS — I255 Ischemic cardiomyopathy: Secondary | ICD-10-CM | POA: Diagnosis not present

## 2017-08-02 DIAGNOSIS — S42401D Unspecified fracture of lower end of right humerus, subsequent encounter for fracture with routine healing: Secondary | ICD-10-CM | POA: Diagnosis not present

## 2017-08-02 DIAGNOSIS — S62611D Displaced fracture of proximal phalanx of left index finger, subsequent encounter for fracture with routine healing: Secondary | ICD-10-CM | POA: Diagnosis not present

## 2017-08-05 DIAGNOSIS — S62616D Displaced fracture of proximal phalanx of right little finger, subsequent encounter for fracture with routine healing: Secondary | ICD-10-CM | POA: Diagnosis not present

## 2017-08-05 DIAGNOSIS — S52121D Displaced fracture of head of right radius, subsequent encounter for closed fracture with routine healing: Secondary | ICD-10-CM | POA: Diagnosis not present

## 2017-08-05 DIAGNOSIS — S8251XD Displaced fracture of medial malleolus of right tibia, subsequent encounter for closed fracture with routine healing: Secondary | ICD-10-CM | POA: Diagnosis not present

## 2017-08-06 DIAGNOSIS — S53431D Radial collateral ligament sprain of right elbow, subsequent encounter: Secondary | ICD-10-CM | POA: Diagnosis not present

## 2017-08-06 DIAGNOSIS — S42401D Unspecified fracture of lower end of right humerus, subsequent encounter for fracture with routine healing: Secondary | ICD-10-CM | POA: Diagnosis not present

## 2017-08-06 DIAGNOSIS — S82841D Displaced bimalleolar fracture of right lower leg, subsequent encounter for closed fracture with routine healing: Secondary | ICD-10-CM | POA: Diagnosis not present

## 2017-08-06 DIAGNOSIS — I255 Ischemic cardiomyopathy: Secondary | ICD-10-CM | POA: Diagnosis not present

## 2017-08-06 DIAGNOSIS — I2510 Atherosclerotic heart disease of native coronary artery without angina pectoris: Secondary | ICD-10-CM | POA: Diagnosis not present

## 2017-08-06 DIAGNOSIS — S62611D Displaced fracture of proximal phalanx of left index finger, subsequent encounter for fracture with routine healing: Secondary | ICD-10-CM | POA: Diagnosis not present

## 2017-08-07 DIAGNOSIS — S42401D Unspecified fracture of lower end of right humerus, subsequent encounter for fracture with routine healing: Secondary | ICD-10-CM | POA: Diagnosis not present

## 2017-08-07 DIAGNOSIS — I2510 Atherosclerotic heart disease of native coronary artery without angina pectoris: Secondary | ICD-10-CM | POA: Diagnosis not present

## 2017-08-07 DIAGNOSIS — I255 Ischemic cardiomyopathy: Secondary | ICD-10-CM | POA: Diagnosis not present

## 2017-08-07 DIAGNOSIS — S62611D Displaced fracture of proximal phalanx of left index finger, subsequent encounter for fracture with routine healing: Secondary | ICD-10-CM | POA: Diagnosis not present

## 2017-08-07 DIAGNOSIS — S53431D Radial collateral ligament sprain of right elbow, subsequent encounter: Secondary | ICD-10-CM | POA: Diagnosis not present

## 2017-08-07 DIAGNOSIS — S82841D Displaced bimalleolar fracture of right lower leg, subsequent encounter for closed fracture with routine healing: Secondary | ICD-10-CM | POA: Diagnosis not present

## 2017-08-08 DIAGNOSIS — S42401D Unspecified fracture of lower end of right humerus, subsequent encounter for fracture with routine healing: Secondary | ICD-10-CM | POA: Diagnosis not present

## 2017-08-08 DIAGNOSIS — I2510 Atherosclerotic heart disease of native coronary artery without angina pectoris: Secondary | ICD-10-CM | POA: Diagnosis not present

## 2017-08-08 DIAGNOSIS — S82841D Displaced bimalleolar fracture of right lower leg, subsequent encounter for closed fracture with routine healing: Secondary | ICD-10-CM | POA: Diagnosis not present

## 2017-08-08 DIAGNOSIS — S53431D Radial collateral ligament sprain of right elbow, subsequent encounter: Secondary | ICD-10-CM | POA: Diagnosis not present

## 2017-08-08 DIAGNOSIS — S62611D Displaced fracture of proximal phalanx of left index finger, subsequent encounter for fracture with routine healing: Secondary | ICD-10-CM | POA: Diagnosis not present

## 2017-08-08 DIAGNOSIS — I255 Ischemic cardiomyopathy: Secondary | ICD-10-CM | POA: Diagnosis not present

## 2017-08-12 DIAGNOSIS — S82841D Displaced bimalleolar fracture of right lower leg, subsequent encounter for closed fracture with routine healing: Secondary | ICD-10-CM | POA: Diagnosis not present

## 2017-08-12 DIAGNOSIS — I255 Ischemic cardiomyopathy: Secondary | ICD-10-CM | POA: Diagnosis not present

## 2017-08-12 DIAGNOSIS — I2510 Atherosclerotic heart disease of native coronary artery without angina pectoris: Secondary | ICD-10-CM | POA: Diagnosis not present

## 2017-08-12 DIAGNOSIS — S42401D Unspecified fracture of lower end of right humerus, subsequent encounter for fracture with routine healing: Secondary | ICD-10-CM | POA: Diagnosis not present

## 2017-08-12 DIAGNOSIS — S62611D Displaced fracture of proximal phalanx of left index finger, subsequent encounter for fracture with routine healing: Secondary | ICD-10-CM | POA: Diagnosis not present

## 2017-08-12 DIAGNOSIS — S53431D Radial collateral ligament sprain of right elbow, subsequent encounter: Secondary | ICD-10-CM | POA: Diagnosis not present

## 2017-08-13 DIAGNOSIS — S42401D Unspecified fracture of lower end of right humerus, subsequent encounter for fracture with routine healing: Secondary | ICD-10-CM | POA: Diagnosis not present

## 2017-08-13 DIAGNOSIS — S62611D Displaced fracture of proximal phalanx of left index finger, subsequent encounter for fracture with routine healing: Secondary | ICD-10-CM | POA: Diagnosis not present

## 2017-08-13 DIAGNOSIS — I2510 Atherosclerotic heart disease of native coronary artery without angina pectoris: Secondary | ICD-10-CM | POA: Diagnosis not present

## 2017-08-13 DIAGNOSIS — S53431D Radial collateral ligament sprain of right elbow, subsequent encounter: Secondary | ICD-10-CM | POA: Diagnosis not present

## 2017-08-13 DIAGNOSIS — I255 Ischemic cardiomyopathy: Secondary | ICD-10-CM | POA: Diagnosis not present

## 2017-08-13 DIAGNOSIS — S82841D Displaced bimalleolar fracture of right lower leg, subsequent encounter for closed fracture with routine healing: Secondary | ICD-10-CM | POA: Diagnosis not present

## 2017-08-14 DIAGNOSIS — I255 Ischemic cardiomyopathy: Secondary | ICD-10-CM | POA: Diagnosis not present

## 2017-08-14 DIAGNOSIS — S62611D Displaced fracture of proximal phalanx of left index finger, subsequent encounter for fracture with routine healing: Secondary | ICD-10-CM | POA: Diagnosis not present

## 2017-08-14 DIAGNOSIS — I2510 Atherosclerotic heart disease of native coronary artery without angina pectoris: Secondary | ICD-10-CM | POA: Diagnosis not present

## 2017-08-14 DIAGNOSIS — S82841D Displaced bimalleolar fracture of right lower leg, subsequent encounter for closed fracture with routine healing: Secondary | ICD-10-CM | POA: Diagnosis not present

## 2017-08-14 DIAGNOSIS — S53431D Radial collateral ligament sprain of right elbow, subsequent encounter: Secondary | ICD-10-CM | POA: Diagnosis not present

## 2017-08-14 DIAGNOSIS — S42401D Unspecified fracture of lower end of right humerus, subsequent encounter for fracture with routine healing: Secondary | ICD-10-CM | POA: Diagnosis not present

## 2017-08-16 DIAGNOSIS — I2510 Atherosclerotic heart disease of native coronary artery without angina pectoris: Secondary | ICD-10-CM | POA: Diagnosis not present

## 2017-08-16 DIAGNOSIS — S62611D Displaced fracture of proximal phalanx of left index finger, subsequent encounter for fracture with routine healing: Secondary | ICD-10-CM | POA: Diagnosis not present

## 2017-08-16 DIAGNOSIS — I255 Ischemic cardiomyopathy: Secondary | ICD-10-CM | POA: Diagnosis not present

## 2017-08-16 DIAGNOSIS — S42401D Unspecified fracture of lower end of right humerus, subsequent encounter for fracture with routine healing: Secondary | ICD-10-CM | POA: Diagnosis not present

## 2017-08-16 DIAGNOSIS — S53431D Radial collateral ligament sprain of right elbow, subsequent encounter: Secondary | ICD-10-CM | POA: Diagnosis not present

## 2017-08-16 DIAGNOSIS — S82841D Displaced bimalleolar fracture of right lower leg, subsequent encounter for closed fracture with routine healing: Secondary | ICD-10-CM | POA: Diagnosis not present

## 2017-08-19 DIAGNOSIS — S42401D Unspecified fracture of lower end of right humerus, subsequent encounter for fracture with routine healing: Secondary | ICD-10-CM | POA: Diagnosis not present

## 2017-08-19 DIAGNOSIS — S53431D Radial collateral ligament sprain of right elbow, subsequent encounter: Secondary | ICD-10-CM | POA: Diagnosis not present

## 2017-08-19 DIAGNOSIS — S82841D Displaced bimalleolar fracture of right lower leg, subsequent encounter for closed fracture with routine healing: Secondary | ICD-10-CM | POA: Diagnosis not present

## 2017-08-19 DIAGNOSIS — I255 Ischemic cardiomyopathy: Secondary | ICD-10-CM | POA: Diagnosis not present

## 2017-08-19 DIAGNOSIS — S62611D Displaced fracture of proximal phalanx of left index finger, subsequent encounter for fracture with routine healing: Secondary | ICD-10-CM | POA: Diagnosis not present

## 2017-08-19 DIAGNOSIS — I2510 Atherosclerotic heart disease of native coronary artery without angina pectoris: Secondary | ICD-10-CM | POA: Diagnosis not present

## 2017-08-21 DIAGNOSIS — I2510 Atherosclerotic heart disease of native coronary artery without angina pectoris: Secondary | ICD-10-CM | POA: Diagnosis not present

## 2017-08-21 DIAGNOSIS — S42401D Unspecified fracture of lower end of right humerus, subsequent encounter for fracture with routine healing: Secondary | ICD-10-CM | POA: Diagnosis not present

## 2017-08-21 DIAGNOSIS — I255 Ischemic cardiomyopathy: Secondary | ICD-10-CM | POA: Diagnosis not present

## 2017-08-21 DIAGNOSIS — S82841D Displaced bimalleolar fracture of right lower leg, subsequent encounter for closed fracture with routine healing: Secondary | ICD-10-CM | POA: Diagnosis not present

## 2017-08-21 DIAGNOSIS — S53431D Radial collateral ligament sprain of right elbow, subsequent encounter: Secondary | ICD-10-CM | POA: Diagnosis not present

## 2017-08-21 DIAGNOSIS — S62611D Displaced fracture of proximal phalanx of left index finger, subsequent encounter for fracture with routine healing: Secondary | ICD-10-CM | POA: Diagnosis not present

## 2017-08-31 ENCOUNTER — Other Ambulatory Visit: Payer: Self-pay | Admitting: Cardiology

## 2017-09-04 DIAGNOSIS — Z23 Encounter for immunization: Secondary | ICD-10-CM | POA: Diagnosis not present

## 2017-09-16 DIAGNOSIS — M25511 Pain in right shoulder: Secondary | ICD-10-CM | POA: Diagnosis not present

## 2017-09-16 DIAGNOSIS — S8251XD Displaced fracture of medial malleolus of right tibia, subsequent encounter for closed fracture with routine healing: Secondary | ICD-10-CM | POA: Diagnosis not present

## 2017-09-16 DIAGNOSIS — S52121D Displaced fracture of head of right radius, subsequent encounter for closed fracture with routine healing: Secondary | ICD-10-CM | POA: Diagnosis not present

## 2017-09-16 DIAGNOSIS — S62616D Displaced fracture of proximal phalanx of right little finger, subsequent encounter for fracture with routine healing: Secondary | ICD-10-CM | POA: Diagnosis not present

## 2017-09-30 ENCOUNTER — Encounter: Payer: Self-pay | Admitting: Physician Assistant

## 2017-09-30 ENCOUNTER — Ambulatory Visit (INDEPENDENT_AMBULATORY_CARE_PROVIDER_SITE_OTHER): Payer: Medicare Other | Admitting: Physician Assistant

## 2017-09-30 VITALS — BP 98/56 | HR 71 | Ht 67.0 in | Wt 195.0 lb

## 2017-09-30 DIAGNOSIS — E7800 Pure hypercholesterolemia, unspecified: Secondary | ICD-10-CM

## 2017-09-30 DIAGNOSIS — I10 Essential (primary) hypertension: Secondary | ICD-10-CM | POA: Diagnosis not present

## 2017-09-30 DIAGNOSIS — I5022 Chronic systolic (congestive) heart failure: Secondary | ICD-10-CM | POA: Diagnosis not present

## 2017-09-30 DIAGNOSIS — Z9861 Coronary angioplasty status: Secondary | ICD-10-CM

## 2017-09-30 DIAGNOSIS — I480 Paroxysmal atrial fibrillation: Secondary | ICD-10-CM

## 2017-09-30 DIAGNOSIS — I2510 Atherosclerotic heart disease of native coronary artery without angina pectoris: Secondary | ICD-10-CM

## 2017-09-30 NOTE — Patient Instructions (Addendum)
Medication Instructions:  Your physician recommends that you continue on your current medications as directed. Please refer to the Current Medication list given to you today.  If you need a refill on your cardiac medications before your next appointment, please call your pharmacy.  Labwork: Your physician recommends that you return for lab work in: before next appt LIPID, LFT Lab [employer] in our office  Testing/Procedures: None   Follow-Up: Your physician wants you to follow-up in: 6 months with Dr Martinique. You will receive a reminder letter in the mail two months in advance. If you don't receive a letter, please call our office to schedule the follow-up appointment. PLEASE CONTACT OFFICE IN JANUARY 2020 TO SCHEDULE NEXT APPT WITH DR Martinique  Any Other Special Instructions Will Be Listed Below (If Applicable).

## 2017-09-30 NOTE — Progress Notes (Signed)
Cardiology Office Note    Date:  09/30/2017   ID:  Ryan Ward, DOB [birth year], MRN [id number]  PCP:  Ryan Downing, MD  Cardiologist:  Dr. Martinique   Chief Complaint  Patient presents with  . Follow-up    6 months    History of Present Illness:  Ryan Ward is a 72 y.o. male with PMH of CAD, chronic systolic HF, h/o RLE DVT/PE, HTN, HLD, OSA and PAF.  Patient had a history of inferior ST elevated MI in December 2016.  Cardiac catheterization showed occluded RCA and a severe LAD and circumflex disease.  RCA was successfully treated with drug-eluting stent.  Periprocedural course complicated by cardiogenic shock, ventricular fibrillation and a complete heart block.  He also developed atrial fibrillation.  Between the same admission, he developed right lower extremity DVT and the right upper lobe pulmonary emboli.  He was placed on Xarelto and to complete the course of anticoagulation.  He underwent relook cardiac catheterization in March 2016 which showed patency of the RCA stent with a residual distal RPDA disease as well as proximal to mid LAD disease and also left circumflex disease.  He underwent successful CABG x4 (LIMA to LAD, SVG to diagonal, sequential SVG to proximal RPDA and the mid RPDA).  Postop course was complicated by atrial fibrillation that was treated successfully with amiodarone therapy.  After discharge, he developed heart failure and was treated with increased dose of Lasix.  Due to significant bradycardia on follow-up, metoprolol and amiodarone were discontinued.  Follow-up echo in July 2017 showed EF improved to 35 to 40%.  ICD was deferred.  Event monitor did not show any recurrence of atrial fibrillation as well.  He was last seen by Dr. Martinique in April 2019, he was maintaining sinus rhythm at the time.  Since last office visit, patient had a hospitalization in June 2019 after falling from a roof while painting.  He suffered injury to the right elbow and right  ankle and he required surgery for right elbow fracture, right small finger proximal phalanx fracture, right posterior malleolus and medial malleolus fracture.  Patient presents today for follow-up.  His blood pressure has always been borderline low, this is unchanged.  He did lose about 7 pounds since April.  He is having very slow recovery of his joint since earlier this year.  Otherwise he denies any chest pain or shortness of breath.  He does have some right ankle edema near the site of fracture, this is expected given the recent injury.  The degree of edema is very mild.  He did receive Lovenox injection for 2 weeks after discharge from the hospital to prevent DVT.  I would expect right lower extremity ankle edema to gradually improve, if it persist, he has been instructed to contact us for right lower extremity venous Doppler.  Otherwise he has no heart failure symptom on physical exam.  Past Medical History:  Diagnosis Date  . Asthma    VERY MILD  . CAD (coronary artery disease)    a. 12/07/14 Inf STEMI/PCI: LAD 85p/m, 49m/d, LCX 80ost, RCA 30p/m, 71m/d, 100d (4.0x20 Promus Premier DES). Hosp course complicated by VF arrest, CGS, CHB req temp wire; b. 03/2015 Cath: LAD 85p/m, 95d, LCx 80ost, RCA 30-40p/m/d, RPDA 75, EF 30-35%, Nl CO; c. 04/06/2015 CABGx4 (LIMA->LAD, VG->Diag, Seq VG->prox RPDA->mid RPDA.  [name] Chronic systolic CHF (congestive heart failure) ([place])    a. 03/2015 Echo: EF 30-35%, diff HK.  [name] DVT (deep venous thrombosis) (  [place]) 12/2014   RLE  . History of blood transfusion 04/2013   S/P CABG  . Hyperlipidemia   . Hypertension   . Hypertensive heart disease   . Ischemic cardiomyopathy    a. 03/2015 Echo: EF 30-35%, diff HK, mild MR, mod dil RA, PASP 23mmHg.  . Myocardial infarction ([place]) 12/2014  . OSA (obstructive sleep apnea)    a. not using CPAP at home, does have machine (06/20/2017)  . Paroxysmal atrial fibrillation ([place])    a. 12/2014 In setting of Inf MI->convertred spont.  .  Pulmonary embolism ([place])    a. 12/2014 R PT and peroneal vein DVT and RUL PE-->Xarelto x 3 mos.    Past Surgical History:  Procedure Laterality Date  . CARDIAC CATHETERIZATION N/A 12/07/2014   Procedure: Coronary Stent Intervention;  Surgeon: Peter M Martinique, MD;  Location: [facility];  Service: Cardiovascular;  Laterality: N/A;  STEMI  . CARDIAC CATHETERIZATION N/A 12/07/2014   Procedure: IABP Insertion;  Surgeon: Peter M Martinique, MD;  Location: [facility];  Service: Cardiovascular;  Laterality: N/A;  . CARDIAC CATHETERIZATION N/A 12/07/2014   Procedure: Temporary Pacemaker;  Surgeon: Peter M Martinique, MD;  Location: [facility];  Service: Cardiovascular;  Laterality: N/A;  . CARDIAC CATHETERIZATION N/A 12/07/2014   Procedure: Left Heart Cath and Coronary Angiography;  Surgeon: Peter M Martinique, MD;  Location: [facility];  Service: Cardiovascular;  Laterality: N/A;  . CARDIAC CATHETERIZATION N/A 03/29/2015   Procedure: Right/Left Heart Cath and Coronary Angiography;  Surgeon: Peter M Martinique, MD;  Location: [facility];  Service: Cardiovascular;  Laterality: N/A;  . CORONARY ARTERY BYPASS GRAFT N/A 04/06/2015   Procedure: CORONARY ARTERY BYPASS GRAFT TIMES FOUR  USING LEFT INTERNAL MAMMARY ARTERY TO THE LEFT ANTERIOR DESCENDING, BILATERAL GREATER SAPHENOUS ENDOVEIN HARVEST GRAFT TO PROXIMAL AND MID POSTERIOR DESCENDING AND DIAGONAL CORONARY ARTERIES AND PLACEMENT OR RIGHT FEMORAL A-LINE. ;  Surgeon: [name], MD;  Location: [facility];  Service: Open Heart Surgery;  Laterality: N/A;  . FRACTURE SURGERY    . HERNIA REPAIR    . OPEN REDUCTION INTERNAL FIXATION (ORIF) PROXIMAL PHALANX Right 06/19/2017   Procedure: OPEN REDUCTION INTERNAL FIXATION (ORIF) PROXIMAL PHALANX;  Surgeon: Shona Needles, MD;  Location: [facility];  Service: Orthopedics;  Laterality: Right;  . ORIF ANKLE FRACTURE Right 06/19/2017   Procedure: OPEN REDUCTION INTERNAL FIXATION (ORIF) ANKLE FRACTURE;   Surgeon: Shona Needles, MD;  Location: [facility];  Service: Orthopedics;  Laterality: Right;  . ORIF ELBOW FRACTURE Right 06/19/2017   Procedure: OPEN REDUCTION ELBOW DISLOCATION WITH RADIAL HEAD REPLACEMENT;  Surgeon: Shona Needles, MD;  Location: [facility];  Service: Orthopedics;  Laterality: Right;  . TEE WITHOUT CARDIOVERSION N/A 04/06/2015   Procedure: TRANSESOPHAGEAL ECHOCARDIOGRAM (TEE);  Surgeon: [name], MD;  Location: [facility];  Service: Open Heart Surgery;  Laterality: N/A;  . TONSILLECTOMY    . UMBILICAL HERNIA REPAIR      Current Medications: Outpatient Medications Prior to Visit  Medication Sig Dispense Refill  . aspirin EC 81 MG tablet Take 81 mg by mouth daily.    [name] atorvastatin (LIPITOR) 80 MG tablet TAKE 1 TABLET (80 MG TOTAL) BY MOUTH DAILY AT 6 PM. 90 tablet 3  . Cholecalciferol (VITAMIN D3) 5000 UNITS TABS Take 5,000 Units by mouth daily.     . famotidine (PEPCID) 20 MG tablet Take 20 mg by mouth 2 (two) times daily. Pt reports he takes  prn depending on dietary intake    . furosemide (LASIX) 40 MG tablet TAKE 1 TABLET BY MOUTH EVERY DAY 90 tablet 3  . KLOR-CON M20 20 MEQ tablet TAKE 2 TABLETS BY MOUTH EVERY DAY (Patient taking differently: Take 20 mEq by mouth daily. ) 180 tablet 3  . levothyroxine (SYNTHROID, LEVOTHROID) 50 MCG tablet TAKE 1 TABLET (50 MCG TOTAL) BY MOUTH 2 (TWO) TIMES DAILY. 180 tablet 1  . losartan (COZAAR) 25 MG tablet Take 1 tablet (25 mg total) by mouth daily. 90 tablet 2  . Multiple Vitamin (MULTIVITAMIN WITH MINERALS) TABS tablet Take 1 tablet by mouth daily.    [name] PROAIR HFA 108 (90 Base) MCG/ACT inhaler Inhale 2 puffs into the lungs every 6 (six) hours as needed for wheezing.     . enoxaparin (LOVENOX) 40 MG/0.4ML injection Inject 0.4 mLs (40 mg total) into the skin daily. 12 mL 0  . methocarbamol (ROBAXIN-750) 750 MG tablet Take 1 tablet (750 mg total) by mouth 4 (four) times daily. 25 tablet 1  . oxyCODONE-acetaminophen (PERCOCET/ROXICET) 5-325  MG tablet Take 1-2 tablets by mouth every 6 (six) hours as needed for severe pain. 40 tablet 0   No facility-administered medications prior to visit.      Allergies:   Patient has no known allergies.   Social History   Socioeconomic History  . Marital status: Significant Other    Spouse name: Not on file  . Number of children: Not on file  . Years of education: Not on file  . Highest education level: Not on file  Occupational History  . Not on file  Social Needs  . Financial resource strain: Not on file  . Food insecurity:    Worry: Not on file    Inability: Not on file  . Transportation needs:    Medical: Not on file    Non-medical: Not on file  Tobacco Use  . Smoking status: Never Smoker  . Smokeless tobacco: Never Used  Substance and Sexual Activity  . Alcohol use: Not Currently    Alcohol/week: 0.0 standard drinks  . Drug use: Never  . Sexual activity: Yes  Lifestyle  . Physical activity:    Days per week: Not on file    Minutes per session: Not on file  . Stress: Not on file  Relationships  . Social connections:    Talks on phone: Not on file    Gets together: Not on file    Attends religious service: Not on file    Active member of club or organization: Not on file    Attends meetings of clubs or organizations: Not on file    Relationship status: Not on file  Other Topics Concern  . Not on file  Social History Narrative   Works with [name]     Family History:  The patient's family history includes Heart attack in his father.   ROS:   Please see the history of present illness.    ROS All other systems reviewed and are negative.   PHYSICAL EXAM:   VS:  BP (!) 98/56 (BP Location: Left Arm, Patient Position: Sitting, Cuff Size: Normal)   Pulse 71   Ht 5\' 7"  (1.702 m)   Wt 195 lb (88.5 kg)   BMI 30.54 kg/m    GEN: Well nourished, well developed, in no acute distress  HEENT: normal  Neck: no JVD, carotid bruits, or masses Cardiac: RRR; no  murmurs, rubs, or gallops,no edema  Respiratory:  clear to auscultation bilaterally, normal work of breathing GI: soft, nontender, nondistended, + BS MS: no deformity or atrophy  Skin: warm and dry, no rash Neuro:  Alert and Oriented x 3, Strength and sensation are intact Psych: euthymic mood, full affect  Wt Readings from Last 3 Encounters:  09/30/17 195 lb (88.5 kg)  06/19/17 199 lb 15.3 oz (90.7 kg)  04/01/17 202 lb 6.4 oz (91.8 kg)      Studies/Labs Reviewed:   EKG:  EKG is ordered today.  The ekg ordered today demonstrates normal sinus rhythm with occasional PVCs.  Q waves in the inferior leads and incomplete right bundle branch block.  Recent Labs: 06/18/2017: ALT 35 06/21/2017: BUN 27; Creatinine, Ser 1.15; Hemoglobin 10.3; Platelets 135; Potassium 4.3; Sodium 139   Lipid Panel    Component Value Date/Time   CHOL 111 10/01/2016 1012   TRIG 51 10/01/2016 1012   HDL 54 10/01/2016 1012   CHOLHDL 2.3 10/10/2015 1428   VLDL 9 10/10/2015 1428   LDLCALC 47 10/01/2016 1012    Additional studies/ records that were reviewed today include:   Cath 03/29/2015  Prox LAD to Mid LAD lesion, 85% stenosed.  Mid LAD to Dist LAD lesion, 95% stenosed.  Ost Cx lesion, 80% stenosed.  Prox RCA to Mid RCA lesion, 30% stenosed.  Mid RCA to Dist RCA lesion, 40% stenosed.  RPDA lesion, 75% stenosed.  There is moderate to severe left ventricular systolic dysfunction.   1. Severe 3 vessel obstructive CAD. Stent in the distal RCA is widely patent.  2. Moderate to severe LV dysfunction with EF 30-35% 3. Mild to moderate pulmonary HTN with elevated LV filling pressures. Prominent V wave on PCWP tracing due to decreased LV compliance.  4. Normal cardiac output.  Plan: Revascularization with CABG.      ASSESSMENT:    1. CAD S/P percutaneous coronary angioplasty   2. Pure hypercholesterolemia   3. Essential hypertension   4. Chronic systolic heart failure ([facility])   5. PAF  (paroxysmal atrial fibrillation) ([facility])      PLAN:  In order of problems listed above:  1. CAD status post CABG: Denies any obvious chest discomfort or shortness of breath.  Continue aspirin, high-dose statin.  Not on beta-blocker due to prior history of bradycardia  2. Hyperlipidemia: On Lipitor 80 mg daily.  Due for fasting lipid panel and LFT.  3. Hypertension: Currently on losartan 25 mg daily.  Blood pressure has been borderline but stable.  4. Chronic systolic heart failure: Euvolemic on physical exam.  Continue 40 mg daily of Lasix  5. PAF: Only has occurred in the past in the postop setting, I would not recommend systemic anticoagulation therapy unless atrial fibrillation recurs.    Medication Adjustments/Labs and Tests Ordered: Current medicines are reviewed at length with the patient today.  Concerns regarding medicines are outlined above.  Medication changes, Labs and Tests ordered today are listed in the Patient Instructions below. Patient Instructions  Medication Instructions:  Your physician recommends that you continue on your current medications as directed. Please refer to the Current Medication list given to you today.  If you need a refill on your cardiac medications before your next appointment, please call your pharmacy.  Labwork: Your physician recommends that you return for lab work in: before next appt LIPID, LFT Lab [employer] in our office  Testing/Procedures: None   Follow-Up: Your physician wants you to follow-up in: 6 months with Dr Martinique. You will receive a reminder  letter in the mail two months in advance. If you don't receive a letter, please call our office to schedule the follow-up appointment. PLEASE CONTACT OFFICE IN JANUARY 2020 TO SCHEDULE NEXT APPT WITH DR Martinique  Any Other Special Instructions Will Be Listed Below (If Applicable).          Hilbert Corrigan, [place]  09/30/2017 6:10 PM    [address], [place], [place]  [address] Phone: [phone]; Fax: [phone]

## 2017-10-02 ENCOUNTER — Encounter: Payer: Self-pay | Admitting: Physical Therapy

## 2017-10-02 ENCOUNTER — Ambulatory Visit: Payer: Medicare Other | Attending: Student | Admitting: Physical Therapy

## 2017-10-02 DIAGNOSIS — M25611 Stiffness of right shoulder, not elsewhere classified: Secondary | ICD-10-CM | POA: Insufficient documentation

## 2017-10-02 DIAGNOSIS — M6281 Muscle weakness (generalized): Secondary | ICD-10-CM | POA: Diagnosis not present

## 2017-10-02 DIAGNOSIS — M25511 Pain in right shoulder: Secondary | ICD-10-CM | POA: Insufficient documentation

## 2017-10-02 NOTE — Therapy (Signed)
[address] [place], [place], [address] Phone: [phone]   Fax:  [phone]  Physical Therapy Evaluation  Patient Details  Name: Ryan Ward MRN: [id number] Date of Birth: 11-Sep-1945 Referring Provider (PT): Haddix   Encounter Date: 10/02/2017  PT End of Session - 10/02/17 1144    Visit Number  1    Date for PT Re-Evaluation  12/02/17    PT Start Time  0900    PT Stop Time  0935    PT Time Calculation (min)  35 min    Activity Tolerance  Patient tolerated treatment well       Past Medical History:  Diagnosis Date  . Asthma    VERY MILD  . CAD (coronary artery disease)    a. 12/07/14 Inf STEMI/PCI: LAD 85p/m, 31m/d, LCX 80ost, RCA 30p/m, 69m/d, 100d (4.0x20 Promus Premier DES). Hosp course complicated by VF arrest, CGS, CHB req temp wire; b. 03/2015 Cath: LAD 85p/m, 95d, LCx 80ost, RCA 30-40p/m/d, RPDA 75, EF 30-35%, Nl CO; c. 04/06/2015 CABGx4 (LIMA->LAD, VG->Diag, Seq VG->prox RPDA->mid RPDA.  [name] Chronic systolic CHF (congestive heart failure) ([place])    a. 03/2015 Echo: EF 30-35%, diff HK.  [name] DVT (deep venous thrombosis) ([place]) 12/2014   RLE  . History of blood transfusion 04/2013   S/P CABG  . Hyperlipidemia   . Hypertension   . Hypertensive heart disease   . Ischemic cardiomyopathy    a. 03/2015 Echo: EF 30-35%, diff HK, mild MR, mod dil RA, PASP 41mmHg.  . Myocardial infarction ([place]) 12/2014  . OSA (obstructive sleep apnea)    a. not using CPAP at home, does have machine (06/20/2017)  . Paroxysmal atrial fibrillation ([place])    a. 12/2014 In setting of Inf MI->convertred spont.  . Pulmonary embolism ([place])    a. 12/2014 R PT and peroneal vein DVT and RUL PE-->Xarelto x 3 mos.    Past Surgical History:  Procedure Laterality Date  . CARDIAC CATHETERIZATION N/A 12/07/2014   Procedure: Coronary Stent Intervention;  Surgeon: Peter M Martinique, MD;  Location: [facility];  Service: Cardiovascular;   Laterality: N/A;  STEMI  . CARDIAC CATHETERIZATION N/A 12/07/2014   Procedure: IABP Insertion;  Surgeon: Peter M Martinique, MD;  Location: [facility];  Service: Cardiovascular;  Laterality: N/A;  . CARDIAC CATHETERIZATION N/A 12/07/2014   Procedure: Temporary Pacemaker;  Surgeon: Peter M Martinique, MD;  Location: [facility];  Service: Cardiovascular;  Laterality: N/A;  . CARDIAC CATHETERIZATION N/A 12/07/2014   Procedure: Left Heart Cath and Coronary Angiography;  Surgeon: Peter M Martinique, MD;  Location: [facility];  Service: Cardiovascular;  Laterality: N/A;  . CARDIAC CATHETERIZATION N/A 03/29/2015   Procedure: Right/Left Heart Cath and Coronary Angiography;  Surgeon: Peter M Martinique, MD;  Location: [facility];  Service: Cardiovascular;  Laterality: N/A;  . CORONARY ARTERY BYPASS GRAFT N/A 04/06/2015   Procedure: CORONARY ARTERY BYPASS GRAFT TIMES FOUR  USING LEFT INTERNAL MAMMARY ARTERY TO THE LEFT ANTERIOR DESCENDING, BILATERAL GREATER SAPHENOUS ENDOVEIN HARVEST GRAFT TO PROXIMAL AND MID POSTERIOR DESCENDING AND DIAGONAL CORONARY ARTERIES AND PLACEMENT OR RIGHT FEMORAL A-LINE. ;  Surgeon: [name], MD;  Location: [facility];  Service: Open Heart Surgery;  Laterality: N/A;  . FRACTURE SURGERY    . HERNIA REPAIR    . OPEN REDUCTION INTERNAL FIXATION (ORIF) PROXIMAL PHALANX Right 06/19/2017   Procedure: OPEN REDUCTION INTERNAL FIXATION (ORIF) PROXIMAL  PHALANX;  Surgeon: Shona Needles, MD;  Location: [facility];  Service: Orthopedics;  Laterality: Right;  . ORIF ANKLE FRACTURE Right 06/19/2017   Procedure: OPEN REDUCTION INTERNAL FIXATION (ORIF) ANKLE FRACTURE;  Surgeon: Shona Needles, MD;  Location: [facility];  Service: Orthopedics;  Laterality: Right;  . ORIF ELBOW FRACTURE Right 06/19/2017   Procedure: OPEN REDUCTION ELBOW DISLOCATION WITH RADIAL HEAD REPLACEMENT;  Surgeon: Shona Needles, MD;  Location: [facility];  Service: Orthopedics;  Laterality: Right;  . TEE WITHOUT  CARDIOVERSION N/A 04/06/2015   Procedure: TRANSESOPHAGEAL ECHOCARDIOGRAM (TEE);  Surgeon: [name], MD;  Location: [facility];  Service: Open Heart Surgery;  Laterality: N/A;  . TONSILLECTOMY    . UMBILICAL HERNIA REPAIR      There were no vitals filed for this visit.   Subjective Assessment - 10/02/17 0906    Subjective  Patient fell off of a ladder in June, he had an ORIF of a right finger, right ankle and right elbow at that time.  He reports that a recent x-ray was negative.  He reports that he is very weak in the right shoulder and having pain,     Limitations  House hold activities;Lifting    Patient Stated Goals  be stronger, have less difficulty    Currently in Pain?  Yes    Pain Score  2     Pain Location  Shoulder    Pain Orientation  Right    Pain Descriptors / Indicators  Aching;Discomfort    Pain Type  Acute pain    Pain Radiating Towards  has some numbness in the right 5th finger    Pain Onset  More than a month ago    Pain Frequency  Constant    Aggravating Factors   reaching up, out and across, trying to sleep pain up to 7/10    Pain Relieving Factors  rest, Tylenol, at best pain a 3/10    Effect of Pain on Daily Activities  limits , reaching, showering         [facility] PT Assessment - 10/02/17 0001      Assessment   Medical Diagnosis  right shoulder pain    Referring Provider (PT)  Haddix    Onset Date/Surgical Date  06/19/17    Hand Dominance  Right    Prior Therapy  no      Precautions   Precautions  None      Balance Screen   Has the patient fallen in the past 6 months  Yes    How many times?  1    Has the patient had a decrease in activity level because of a fear of falling?   No    Is the patient reluctant to leave their home because of a fear of falling?   No      Home Environment   Additional Comments  housework, yardwork      Prior Function   Level of Independence  Independent    Vocation  Retired;Part time employment    Vocation  Requirements  part time teaching     Leisure  no exercise      Posture/Postural Control   Posture Comments  fwd head, rounded shoulders      ROM / Strength   AROM / PROM / Strength  AROM;PROM;Strength      AROM   Overall AROM Comments  painful arcs for flexion and abduction at 70-100 degrees    AROM Assessment Site  Shoulder    Right/Left Shoulder  Right    Right Shoulder Flexion  150 Degrees    Right Shoulder ABduction  150 Degrees    Right Shoulder Internal Rotation  60 Degrees    Right Shoulder External Rotation  70 Degrees      PROM   PROM Assessment Site  Shoulder    Right/Left Shoulder  Right    Right Shoulder Flexion  165 Degrees    Right Shoulder ABduction  165 Degrees    Right Shoulder Internal Rotation  70 Degrees    Right Shoulder External Rotation  80 Degrees      Strength   Strength Assessment Site  Shoulder    Right/Left Shoulder  Right    Right Shoulder Flexion  3+/5    Right Shoulder ABduction  3+/5    Right Shoulder Internal Rotation  3+/5    Right Shoulder External Rotation  3+/5      Palpation   Palpation comment  crepitus with ROM, mostly with eccentric motions, the infraspinatus seems to have some significant wasting, he also has right medial scapular winging      Special Tests   Other special tests  + empty can but can give some resistance                Objective measurements completed on examination: See above findings.              PT Education - 10/02/17 1143    Education Details  HEP for scapular stabilization and ER Yellow and red tband    Person(s) Educated  Patient    Methods  Explanation;Demonstration;Handout    Comprehension  Verbalized understanding;Returned demonstration;Verbal cues required;Tactile cues required       PT Short Term Goals - 10/02/17 1208      PT SHORT TERM GOAL #1   Title  independent with initial HEP    Time  1    Period  Weeks    Status  New        PT Long Term Goals - 10/02/17 1208       PT LONG TERM GOAL #1   Title  understand posture and body mechanics    Time  8    Period  Weeks    Status  New      PT LONG TERM GOAL #2   Title  increase ER strength to 4/5    Time  8    Period  Weeks    Status  New      PT LONG TERM GOAL #3   Title  decrease pain 50% with reaching    Time  8    Period  Weeks    Status  New      PT LONG TERM GOAL #4   Title  report being able to wash left underarm without pain or difficulty    Time  8    Period  Weeks    Status  New             Plan - 10/02/17 1145    Clinical Impression Statement  Patient fell off a ladder in June, he had to undergo ORIF on his right elbow, 5th finger and ankle.  He reports that his right shoulder has hurt since the fall, he has not had an MRI, reports x-rays were "okay".   His AROM is pretty good, his issues are a painful arc with flexion and abduction, pain with reaching out and across  and up.  He is very limited with strength rated a 3+/5, his infraspinatus seems to have some wasting, he also has medial winging on the right scapula.      History and Personal Factors relevant to plan of care:  right elbow, finger and ankle ORIF, multiple heart surgeries    Clinical Presentation  Evolving    Clinical Decision Making  Low    Rehab Potential  Good    PT Frequency  1x / week    PT Duration  8 weeks    PT Treatment/Interventions  ADLs/Self Care Home Management;Electrical Stimulation;Iontophoresis 4mg /ml Dexamethasone;Moist Heat;Ultrasound;Therapeutic activities;Therapeutic exercise;Manual techniques;Vasopneumatic Device;Patient/family education    PT Next Visit Plan  he is to do his exercises and return next week and we will work on scapular and RC strengthening    Consulted and Agree with Plan of Care  Patient       Patient will benefit from skilled therapeutic intervention in order to improve the following deficits and impairments:  Decreased range of motion, Impaired UE functional use, Increased  muscle spasms, Pain, Decreased strength, Postural dysfunction  Visit Diagnosis: Acute pain of right shoulder - Plan: PT plan of care cert/re-cert  Stiffness of right shoulder, not elsewhere classified - Plan: PT plan of care cert/re-cert  Muscle weakness (generalized) - Plan: PT plan of care cert/re-cert     Problem List Patient Active Problem List   Diagnosis Date Noted  . Fall on and from ladder causing accidental injury, initial encounter 06/21/2017  . Closed displaced fracture of proximal phalanx of right little finger 06/21/2017  . Closed displaced fracture of medial malleolus of right tibia 06/21/2017  . Closed displaced fracture of head of right radius 06/21/2017  . Fall from roof as cause of accidental injury 06/21/2017  . Closed fracture dislocation of elbow, right, initial encounter 06/18/2017  . Sinus bradycardia 06/01/2015  . Chronic systolic CHF (congestive heart failure) ([facility])   . Ischemic cardiomyopathy   . S/P CABG x 4 04/06/2015  . CAD (coronary artery disease) 04/01/2015  . Chronic combined systolic and diastolic CHF (congestive heart failure) ([place] [facility]) 12/28/2014  . Hyperlipidemia 12/28/2014  . Pulmonary embolism without acute cor pulmonale ([facility])   . Paroxysmal atrial fibrillation ([facility])   . Acute combined systolic and diastolic heart failure ([facility])   . Hematuria 12/08/2014  . Ventricular fibrillation ([facility]) 12/08/2014  . CAD S/P percutaneous coronary angioplasty 12/08/2014  . ST elevation (STEMI) myocardial infarction involving right coronary artery ([facility]) 12/07/2014  . Complete heart block ([facility]) 12/07/2014  . Cardiogenic shock ([place]) 12/07/2014  . HTN (hypertension) 12/07/2014    Sumner Boast., PT 10/02/2017, 12:11 PM  [address] [place], [place], [address] Phone: [phone]   Fax:  [phone]  Name: Jiro Kiester MRN: [id number] Date of Birth: 08/24/1945

## 2017-10-04 DIAGNOSIS — D229 Melanocytic nevi, unspecified: Secondary | ICD-10-CM | POA: Diagnosis not present

## 2017-10-04 DIAGNOSIS — D1801 Hemangioma of skin and subcutaneous tissue: Secondary | ICD-10-CM | POA: Diagnosis not present

## 2017-10-04 DIAGNOSIS — Z85828 Personal history of other malignant neoplasm of skin: Secondary | ICD-10-CM | POA: Diagnosis not present

## 2017-10-04 DIAGNOSIS — L570 Actinic keratosis: Secondary | ICD-10-CM | POA: Diagnosis not present

## 2017-10-04 DIAGNOSIS — L821 Other seborrheic keratosis: Secondary | ICD-10-CM | POA: Diagnosis not present

## 2017-10-04 DIAGNOSIS — L814 Other melanin hyperpigmentation: Secondary | ICD-10-CM | POA: Diagnosis not present

## 2017-10-07 ENCOUNTER — Other Ambulatory Visit: Payer: Self-pay | Admitting: Cardiology

## 2017-10-15 ENCOUNTER — Ambulatory Visit: Payer: Medicare Other | Admitting: Physical Therapy

## 2017-10-15 ENCOUNTER — Other Ambulatory Visit: Payer: Self-pay | Admitting: Cardiovascular Disease

## 2017-10-22 ENCOUNTER — Encounter: Payer: Medicare Other | Admitting: Physical Therapy

## 2017-10-23 ENCOUNTER — Encounter: Payer: Self-pay | Admitting: Physical Therapy

## 2017-10-23 ENCOUNTER — Ambulatory Visit: Payer: Medicare Other | Admitting: Physical Therapy

## 2017-10-23 DIAGNOSIS — M6281 Muscle weakness (generalized): Secondary | ICD-10-CM | POA: Diagnosis not present

## 2017-10-23 DIAGNOSIS — M25611 Stiffness of right shoulder, not elsewhere classified: Secondary | ICD-10-CM | POA: Diagnosis not present

## 2017-10-23 DIAGNOSIS — M25511 Pain in right shoulder: Secondary | ICD-10-CM

## 2017-10-23 NOTE — Therapy (Signed)
[address] [place], [place], [address] Phone: [phone]   Fax:  [phone]  Physical Therapy Treatment  Patient Details  Name: Ryan Ward MRN: [id number] Date of Birth: 1945/07/02 Referring Provider (PT): Haddix   Encounter Date: 10/23/2017  PT End of Session - 10/23/17 1154    Visit Number  2    Date for PT Re-Evaluation  12/02/17    PT Start Time  1108    PT Stop Time  1145    PT Time Calculation (min)  37 min    Activity Tolerance  Patient tolerated treatment well    Behavior During Therapy  [facility] for tasks assessed/performed       Past Medical History:  Diagnosis Date  . Asthma    VERY MILD  . CAD (coronary artery disease)    a. 12/07/14 Inf STEMI/PCI: LAD 85p/m, 33m/d, LCX 80ost, RCA 30p/m, 100m/d, 100d (4.0x20 Promus Premier DES). Hosp course complicated by VF arrest, CGS, CHB req temp wire; b. 03/2015 Cath: LAD 85p/m, 95d, LCx 80ost, RCA 30-40p/m/d, RPDA 75, EF 30-35%, Nl CO; c. 04/06/2015 CABGx4 (LIMA->LAD, VG->Diag, Seq VG->prox RPDA->mid RPDA.  [name] Chronic systolic CHF (congestive heart failure) ([place])    a. 03/2015 Echo: EF 30-35%, diff HK.  [name] DVT (deep venous thrombosis) ([place]) 12/2014   RLE  . History of blood transfusion 04/2013   S/P CABG  . Hyperlipidemia   . Hypertension   . Hypertensive heart disease   . Ischemic cardiomyopathy    a. 03/2015 Echo: EF 30-35%, diff HK, mild MR, mod dil RA, PASP 65mmHg.  . Myocardial infarction ([place]) 12/2014  . OSA (obstructive sleep apnea)    a. not using CPAP at home, does have machine (06/20/2017)  . Paroxysmal atrial fibrillation ([place])    a. 12/2014 In setting of Inf MI->convertred spont.  . Pulmonary embolism ([place])    a. 12/2014 R PT and peroneal vein DVT and RUL PE-->Xarelto x 3 mos.    Past Surgical History:  Procedure Laterality Date  . CARDIAC CATHETERIZATION N/A 12/07/2014   Procedure: Coronary Stent Intervention;  Surgeon: Peter M Martinique,  MD;  Location: [facility];  Service: Cardiovascular;  Laterality: N/A;  STEMI  . CARDIAC CATHETERIZATION N/A 12/07/2014   Procedure: IABP Insertion;  Surgeon: Peter M Martinique, MD;  Location: [facility];  Service: Cardiovascular;  Laterality: N/A;  . CARDIAC CATHETERIZATION N/A 12/07/2014   Procedure: Temporary Pacemaker;  Surgeon: Peter M Martinique, MD;  Location: [facility];  Service: Cardiovascular;  Laterality: N/A;  . CARDIAC CATHETERIZATION N/A 12/07/2014   Procedure: Left Heart Cath and Coronary Angiography;  Surgeon: Peter M Martinique, MD;  Location: [facility];  Service: Cardiovascular;  Laterality: N/A;  . CARDIAC CATHETERIZATION N/A 03/29/2015   Procedure: Right/Left Heart Cath and Coronary Angiography;  Surgeon: Peter M Martinique, MD;  Location: [facility];  Service: Cardiovascular;  Laterality: N/A;  . CORONARY ARTERY BYPASS GRAFT N/A 04/06/2015   Procedure: CORONARY ARTERY BYPASS GRAFT TIMES FOUR  USING LEFT INTERNAL MAMMARY ARTERY TO THE LEFT ANTERIOR DESCENDING, BILATERAL GREATER SAPHENOUS ENDOVEIN HARVEST GRAFT TO PROXIMAL AND MID POSTERIOR DESCENDING AND DIAGONAL CORONARY ARTERIES AND PLACEMENT OR RIGHT FEMORAL A-LINE. ;  Surgeon: [name], MD;  Location: [facility];  Service: Open Heart Surgery;  Laterality: N/A;  . FRACTURE SURGERY    . HERNIA REPAIR    . OPEN REDUCTION INTERNAL FIXATION (ORIF) PROXIMAL PHALANX  Right 06/19/2017   Procedure: OPEN REDUCTION INTERNAL FIXATION (ORIF) PROXIMAL PHALANX;  Surgeon: Shona Needles, MD;  Location: [facility];  Service: Orthopedics;  Laterality: Right;  . ORIF ANKLE FRACTURE Right 06/19/2017   Procedure: OPEN REDUCTION INTERNAL FIXATION (ORIF) ANKLE FRACTURE;  Surgeon: Shona Needles, MD;  Location: [facility];  Service: Orthopedics;  Laterality: Right;  . ORIF ELBOW FRACTURE Right 06/19/2017   Procedure: OPEN REDUCTION ELBOW DISLOCATION WITH RADIAL HEAD REPLACEMENT;  Surgeon: Shona Needles, MD;  Location: [facility];   Service: Orthopedics;  Laterality: Right;  . TEE WITHOUT CARDIOVERSION N/A 04/06/2015   Procedure: TRANSESOPHAGEAL ECHOCARDIOGRAM (TEE);  Surgeon: [name], MD;  Location: [facility];  Service: Open Heart Surgery;  Laterality: N/A;  . TONSILLECTOMY    . UMBILICAL HERNIA REPAIR      There were no vitals filed for this visit.  Subjective Assessment - 10/23/17 1109    Subjective  "No enough pain to keep me awake at night"    Currently in Pain?  Yes    Pain Score  2     Pain Location  --   Elbow and shoulder   Pain Orientation  Right                       [facility] Adult PT Treatment/Exercise - 10/23/17 0001      Exercises   Exercises  Shoulder;Elbow      Shoulder Exercises: Supine   Other Supine Exercises  Internal and external rotation 2lb 2x10     Other Supine Exercises  1lb flexion x10, 1lb scaption x10       Shoulder Exercises: Seated   External Rotation  Right;AROM;10 reps   Isometric 5x3''     Shoulder Exercises: Standing   Extension  Strengthening;Both;15 reps;Theraband    Row  Strengthening;Both;15 reps;Theraband   x2   Theraband Level (Shoulder Row)  Level 1 (Yellow)      Shoulder Exercises: ROM/Strengthening   UBE (Upper Arm Bike)  L2 72min each way               PT Short Term Goals - 10/23/17 1205      PT SHORT TERM GOAL #1   Title  independent with initial HEP    Status  Achieved        PT Long Term Goals - 10/02/17 1208      PT LONG TERM GOAL #1   Title  understand posture and body mechanics    Time  8    Period  Weeks    Status  New      PT LONG TERM GOAL #2   Title  increase ER strength to 4/5    Time  8    Period  Weeks    Status  New      PT LONG TERM GOAL #3   Title  decrease pain 50% with reaching    Time  8    Period  Weeks    Status  New      PT LONG TERM GOAL #4   Title  report being able to wash left underarm without pain or difficulty    Time  8    Period  Weeks    Status  New            Plan  - 10/23/17 1200    Clinical Impression Statement  Pt ~ 8 minutes late for today's treatment session. Pt has good movement with  seated ER, but had some difficulty when resistance added. Postural cues given with standing rows and extensions. Moved to supine with R shoulder abducted to 90 and pt was able to tolerate ER with some resistance. Resisted flexion causes 5/10 pain but 3/10 pain with scaption.     Rehab Potential  Good    PT Frequency  1x / week    PT Duration  8 weeks    PT Treatment/Interventions  ADLs/Self Care Home Management;Electrical Stimulation;Iontophoresis 4mg /ml Dexamethasone;Moist Heat;Ultrasound;Therapeutic activities;Therapeutic exercise;Manual techniques;Vasopneumatic Device;Patient/family education    PT Next Visit Plan  scapular and RC strengthening       Patient will benefit from skilled therapeutic intervention in order to improve the following deficits and impairments:  Decreased range of motion, Impaired UE functional use, Increased muscle spasms, Pain, Decreased strength, Postural dysfunction  Visit Diagnosis: Acute pain of right shoulder  Stiffness of right shoulder, not elsewhere classified  Muscle weakness (generalized)     Problem List Patient Active Problem List   Diagnosis Date Noted  . Fall on and from ladder causing accidental injury, initial encounter 06/21/2017  . Closed displaced fracture of proximal phalanx of right little finger 06/21/2017  . Closed displaced fracture of medial malleolus of right tibia 06/21/2017  . Closed displaced fracture of head of right radius 06/21/2017  . Fall from roof as cause of accidental injury 06/21/2017  . Closed fracture dislocation of elbow, right, initial encounter 06/18/2017  . Sinus bradycardia 06/01/2015  . Chronic systolic CHF (congestive heart failure) ([place])   . Ischemic cardiomyopathy   . S/P CABG x 4 04/06/2015  . CAD (coronary artery disease) 04/01/2015  . Chronic combined systolic and diastolic CHF  (congestive heart failure) ([place]) 12/28/2014  . Hyperlipidemia 12/28/2014  . Pulmonary embolism without acute cor pulmonale ([place])   . Paroxysmal atrial fibrillation ([facility])   . Acute combined systolic and diastolic heart failure ([place])   . Hematuria 12/08/2014  . Ventricular fibrillation ([place]) 12/08/2014  . CAD S/P percutaneous coronary angioplasty 12/08/2014  . ST elevation (STEMI) myocardial infarction involving right coronary artery ([place]) 12/07/2014  . Complete heart block ([place]) 12/07/2014  . Cardiogenic shock ([place]) 12/07/2014  . HTN (hypertension) 12/07/2014    Scot Jun 10/23/2017, 12:05 PM  [address] [place], [place], [address] Phone: [phone]   Fax:  [phone]  Name: Ryan Ward MRN: [id number] Date of Birth: 1945-01-03

## 2017-11-01 ENCOUNTER — Ambulatory Visit: Payer: Medicare Other | Admitting: Physical Therapy

## 2017-11-06 ENCOUNTER — Ambulatory Visit: Payer: Medicare Other | Attending: Student | Admitting: Physical Therapy

## 2017-11-06 DIAGNOSIS — M6281 Muscle weakness (generalized): Secondary | ICD-10-CM

## 2017-11-06 DIAGNOSIS — M25511 Pain in right shoulder: Secondary | ICD-10-CM | POA: Insufficient documentation

## 2017-11-06 DIAGNOSIS — M25611 Stiffness of right shoulder, not elsewhere classified: Secondary | ICD-10-CM | POA: Diagnosis not present

## 2017-11-06 NOTE — Therapy (Signed)
[address] [place], [place], [address] Phone: [phone]   Fax:  [phone]  Physical Therapy Treatment  Patient Details  Name: Ryan Ward MRN: [id number] Date of Birth: October 06, 1945 Referring Provider (PT): Haddix   Encounter Date: 11/06/2017  PT End of Session - 11/06/17 1013    Visit Number  3    Date for PT Re-Evaluation  12/02/17    PT Start Time  0930    PT Stop Time  1013    PT Time Calculation (min)  43 min    Activity Tolerance  Patient tolerated treatment well    Behavior During Therapy  [facility] for tasks assessed/performed       Past Medical History:  Diagnosis Date  . Asthma    VERY MILD  . CAD (coronary artery disease)    a. 12/07/14 Inf STEMI/PCI: LAD 85p/m, 73m/d, LCX 80ost, RCA 30p/m, 61m/d, 100d (4.0x20 Promus Premier DES). Hosp course complicated by VF arrest, CGS, CHB req temp wire; b. 03/2015 Cath: LAD 85p/m, 95d, LCx 80ost, RCA 30-40p/m/d, RPDA 75, EF 30-35%, Nl CO; c. 04/06/2015 CABGx4 (LIMA->LAD, VG->Diag, Seq VG->prox RPDA->mid RPDA.  [name] Chronic systolic CHF (congestive heart failure) ([place])    a. 03/2015 Echo: EF 30-35%, diff HK.  [name] DVT (deep venous thrombosis) ([place]) 12/2014   RLE  . History of blood transfusion 04/2013   S/P CABG  . Hyperlipidemia   . Hypertension   . Hypertensive heart disease   . Ischemic cardiomyopathy    a. 03/2015 Echo: EF 30-35%, diff HK, mild MR, mod dil RA, PASP 26mmHg.  . Myocardial infarction ([place]) 12/2014  . OSA (obstructive sleep apnea)    a. not using CPAP at home, does have machine (06/20/2017)  . Paroxysmal atrial fibrillation ([place])    a. 12/2014 In setting of Inf MI->convertred spont.  . Pulmonary embolism ([place])    a. 12/2014 R PT and peroneal vein DVT and RUL PE-->Xarelto x 3 mos.    Past Surgical History:  Procedure Laterality Date  . CARDIAC CATHETERIZATION N/A 12/07/2014   Procedure: Coronary Stent Intervention;  Surgeon: Peter M Martinique,  MD;  Location: [place] [facility];  Service: Cardiovascular;  Laterality: N/A;  STEMI  . CARDIAC CATHETERIZATION N/A 12/07/2014   Procedure: IABP Insertion;  Surgeon: Peter M Martinique, MD;  Location: [place] [facility];  Service: Cardiovascular;  Laterality: N/A;  . CARDIAC CATHETERIZATION N/A 12/07/2014   Procedure: Temporary Pacemaker;  Surgeon: Peter M Martinique, MD;  Location: [place] CV [facility];  Service: Cardiovascular;  Laterality: N/A;  . CARDIAC CATHETERIZATION N/A 12/07/2014   Procedure: Left Heart Cath and Coronary Angiography;  Surgeon: Peter M Martinique, MD;  Location: [place] [facility];  Service: Cardiovascular;  Laterality: N/A;  . CARDIAC CATHETERIZATION N/A 03/29/2015   Procedure: Right/Left Heart Cath and Coronary Angiography;  Surgeon: Peter M Martinique, MD;  Location: [place] [facility];  Service: Cardiovascular;  Laterality: N/A;  . CORONARY ARTERY BYPASS GRAFT N/A 04/06/2015   Procedure: CORONARY ARTERY BYPASS GRAFT TIMES FOUR  USING LEFT INTERNAL MAMMARY ARTERY TO THE LEFT ANTERIOR DESCENDING, BILATERAL GREATER SAPHENOUS ENDOVEIN HARVEST GRAFT TO PROXIMAL AND MID POSTERIOR DESCENDING AND DIAGONAL CORONARY ARTERIES AND PLACEMENT OR RIGHT FEMORAL A-LINE. ;  Surgeon: [name], MD;  Location: [place];  Service: Open Heart Surgery;  Laterality: N/A;  . FRACTURE SURGERY    . HERNIA REPAIR    . OPEN REDUCTION INTERNAL FIXATION (ORIF) PROXIMAL PHALANX  Right 06/19/2017   Procedure: OPEN REDUCTION INTERNAL FIXATION (ORIF) PROXIMAL PHALANX;  Surgeon: Shona Needles, MD;  Location: [place];  Service: Orthopedics;  Laterality: Right;  . ORIF ANKLE FRACTURE Right 06/19/2017   Procedure: OPEN REDUCTION INTERNAL FIXATION (ORIF) ANKLE FRACTURE;  Surgeon: Shona Needles, MD;  Location: [place];  Service: Orthopedics;  Laterality: Right;  . ORIF ELBOW FRACTURE Right 06/19/2017   Procedure: OPEN REDUCTION ELBOW DISLOCATION WITH RADIAL HEAD REPLACEMENT;  Surgeon: Shona Needles, MD;  Location: [place];   Service: Orthopedics;  Laterality: Right;  . TEE WITHOUT CARDIOVERSION N/A 04/06/2015   Procedure: TRANSESOPHAGEAL ECHOCARDIOGRAM (TEE);  Surgeon: [name], MD;  Location: [place];  Service: Open Heart Surgery;  Laterality: N/A;  . TONSILLECTOMY    . UMBILICAL HERNIA REPAIR      There were no vitals filed for this visit.  Subjective Assessment - 11/06/17 0930    Subjective  "I have been feeling a little better"    Currently in Pain?  Yes    Pain Score  1     Pain Location  Shoulder    Pain Orientation  Right                       [facility] Adult PT Treatment/Exercise - 11/06/17 0001      Exercises   Exercises  Shoulder;Elbow      Shoulder Exercises: Standing   External Rotation  AROM;Strengthening;20 reps;Theraband    Theraband Level (Shoulder External Rotation)  Level 2 (Red)    Internal Rotation  AROM;Strengthening;20 reps;Theraband    Theraband Level (Shoulder Internal Rotation)  Level 2 (Red)    Flexion  AROM;Strengthening;Both;10 reps;Weights    Shoulder Flexion Weight (lbs)  1lb    ABduction  AROM;Strengthening;10 reps;Weights    Shoulder ABduction Weight (lbs)  1 lb    Extension  Strengthening;Both;20 reps;Theraband   2 sec hold   Theraband Level (Shoulder Extension)  Level 2 (Red)    Row  Strengthening;20 reps;Theraband   2 sec hold   Theraband Level (Shoulder Row)  Level 2 (Red)    Shoulder Elevation  AROM;Strengthening;15 reps   2x15, 3 lbs   Other Standing Exercises  shoulder backwards rolls, 2x15, 3 lbs    Other Standing Exercises  Serratus push ups on wall 2x10, wall walks (red Tband),       Shoulder Exercises: ROM/Strengthening   UBE (Upper Arm Bike)  L2 63min each way      Manual Therapy   Manual Therapy  Passive ROM    Passive ROM  all directions               PT Short Term Goals - 10/23/17 1205      PT SHORT TERM GOAL #1   Title  independent with initial HEP    Status  Achieved        PT Long Term Goals - 11/06/17  1016      PT LONG TERM GOAL #1   Title  understand posture and body mechanics    Time  8    Period  Weeks            Plan - 11/06/17 1013    Clinical Impression Statement  Pt still demonstrates reduced AROM as compared to PROM with R shoulder ER and Abd. Min VC given for posture & form with standing exercises. Pt needed rest break x 3 due to fatigue. Pt is slowly progressing towards all goals  at this time.    Rehab Potential  Good    PT Next Visit Plan  Continue progressing scapular strengthening as pain allows    Consulted and Agree with Plan of Care  Patient       Patient will benefit from skilled therapeutic intervention in order to improve the following deficits and impairments:  Decreased range of motion, Impaired UE functional use, Increased muscle spasms, Pain, Decreased strength, Postural dysfunction  Visit Diagnosis: Acute pain of right shoulder  Stiffness of right shoulder, not elsewhere classified  Muscle weakness (generalized)     Problem List Patient Active Problem List   Diagnosis Date Noted  . Fall on and from ladder causing accidental injury, initial encounter 06/21/2017  . Closed displaced fracture of proximal phalanx of right little finger 06/21/2017  . Closed displaced fracture of medial malleolus of right tibia 06/21/2017  . Closed displaced fracture of head of right radius 06/21/2017  . Fall from roof as cause of accidental injury 06/21/2017  . Closed fracture dislocation of elbow, right, initial encounter 06/18/2017  . Sinus bradycardia 06/01/2015  . Chronic systolic CHF (congestive heart failure) ([place])   . Ischemic cardiomyopathy   . S/P CABG x 4 04/06/2015  . CAD (coronary artery disease) 04/01/2015  . Chronic combined systolic and diastolic CHF (congestive heart failure) ([place]) 12/28/2014  . Hyperlipidemia 12/28/2014  . Pulmonary embolism without acute cor pulmonale ([place])   . Paroxysmal atrial fibrillation ([facility])   . Acute combined systolic  and diastolic heart failure ([place])   . Hematuria 12/08/2014  . Ventricular fibrillation ([place]) 12/08/2014  . CAD S/P percutaneous coronary angioplasty 12/08/2014  . ST elevation (STEMI) myocardial infarction involving right coronary artery ([place]) 12/07/2014  . Complete heart block ([place]) 12/07/2014  . Cardiogenic shock ([place]) 12/07/2014  . HTN (hypertension) 12/07/2014    Howell Rucks, SPTA 11/06/2017, 10:18 AM  [address] [place], [place], [address] Phone: [phone]   Fax:  [phone]  Name: Ryan Ward MRN: [id number] Date of Birth: 1945-06-24

## 2017-11-08 ENCOUNTER — Encounter: Payer: Self-pay | Admitting: Physical Therapy

## 2017-11-08 ENCOUNTER — Ambulatory Visit: Payer: Medicare Other | Admitting: Physical Therapy

## 2017-11-08 DIAGNOSIS — M6281 Muscle weakness (generalized): Secondary | ICD-10-CM | POA: Diagnosis not present

## 2017-11-08 DIAGNOSIS — M25511 Pain in right shoulder: Secondary | ICD-10-CM | POA: Diagnosis not present

## 2017-11-08 DIAGNOSIS — M25611 Stiffness of right shoulder, not elsewhere classified: Secondary | ICD-10-CM | POA: Diagnosis not present

## 2017-11-08 NOTE — Therapy (Signed)
[address] [place], [place], [address] Phone: [phone]   Fax:  [phone]  Physical Therapy Treatment  Patient Details  Name: Ryan Ward MRN: [id number] Date of Birth: 1945-08-07 Referring Provider (PT): Haddix   Encounter Date: 11/08/2017  PT End of Session - 11/08/17 1057    Visit Number  4    Date for PT Re-Evaluation  12/02/17    PT Start Time  9470    PT Stop Time  1057    PT Time Calculation (min)  42 min    Activity Tolerance  Patient tolerated treatment well    Behavior During Therapy  [facility] for tasks assessed/performed       Past Medical History:  Diagnosis Date  . Asthma    VERY MILD  . CAD (coronary artery disease)    a. 12/07/14 Inf STEMI/PCI: LAD 85p/m, 66md, LCX 80ost, RCA 30p/m, 492m, 100d (4.0x20 Promus Premier DES). Hosp course complicated by VF arrest, CGS, CHB req temp wire; b. 03/2015 Cath: LAD 85p/m, 95d, LCx 80ost, RCA 30-40p/m/d, RPDA 75, EF 30-35%, Nl CO; c. 04/06/2015 CABGx4 (LIMA->LAD, VG->Diag, Seq VG->prox RPDA->mid RPDA.  . [name] Kitchenhronic systolic CHF (congestive heart failure) ([place]   a. 03/2015 Echo: EF 30-35%, diff HK.  . [name] KitchenVT (deep venous thrombosis) (HCLevant12/2016   RLE  . History of blood transfusion 04/2013   S/P CABG  . Hyperlipidemia   . Hypertension   . Hypertensive heart disease   . Ischemic cardiomyopathy    a. 03/2015 Echo: EF 30-35%, diff HK, mild MR, mod dil RA, PASP 4968m.  . Myocardial infarction (HCCTutuilla2/2016  . OSA (obstructive sleep apnea)    a. not using CPAP at home, does have machine (06/20/2017)  . Paroxysmal atrial fibrillation ([place]CC[place]  a. 12/2014 In setting of Inf MI->convertred spont.  . Pulmonary embolism ([place]CC[place]  a. 12/2014 R PT and peroneal vein DVT and RUL PE-->Xarelto x 3 mos.    Past Surgical History:  Procedure Laterality Date  . CARDIAC CATHETERIZATION N/A 12/07/2014   Procedure: Coronary Stent Intervention;  Surgeon: Peter M JorMartiniqueMD;  [facility];  Service: Cardiovascular;  Laterality: N/A;  STEMI  . CARDIAC CATHETERIZATION N/A 12/07/2014   Procedure: IABP Insertion;  Surgeon: Peter M JorMartiniqueD;  [facility];  Service: Cardiovascular;  Laterality: N/A;  . CARDIAC CATHETERIZATION N/A 12/07/2014   Procedure: Temporary Pacemaker;  Surgeon: Peter M JorMartiniqueD;  [facility];  Service: Cardiovascular;  Laterality: N/A;  . CARDIAC CATHETERIZATION N/A 12/07/2014   Procedure: Left Heart Cath and Coronary Angiography;  Surgeon: Peter M JorMartiniqueD;  [facility] [place] [facility];  Service: Cardiovascular;  Laterality: N/A;  . CARDIAC CATHETERIZATION N/A 03/29/2015   Procedure: Right/Left Heart Cath and Coronary Angiography;  Surgeon: Peter M JorMartiniqueD;  [facility] [place] [facility];  Service: Cardiovascular;  Laterality: N/A;  . CORONARY ARTERY BYPASS GRAFT N/A 04/06/2015   Procedure: CORONARY ARTERY BYPASS GRAFT TIMES FOUR  USING LEFT INTERNAL MAMMARY ARTERY TO THE LEFT ANTERIOR DESCENDING, BILATERAL GREATER SAPHENOUS ENDOVEIN HARVEST GRAFT TO PROXIMAL AND MID POSTERIOR DESCENDING AND DIAGONAL CORONARY ARTERIES AND PLACEMENT OR RIGHT FEMORAL A-LINE. ;  Surgeon: EdwGrace IsaacD;  [facility];  Laterality: N/A;  . FRACTURE SURGERY    . HERNIA REPAIR    . OPEN REDUCTION INTERNAL FIXATION (ORIF) PROXIMAL PHALANX  Right 06/19/2017   Procedure: OPEN REDUCTION INTERNAL FIXATION (ORIF) PROXIMAL PHALANX;  Surgeon: Shona Needles, MD;  Location: [place];  Service: Orthopedics;  Laterality: Right;  . ORIF ANKLE FRACTURE Right 06/19/2017   Procedure: OPEN REDUCTION INTERNAL FIXATION (ORIF) ANKLE FRACTURE;  Surgeon: Shona Needles, MD;  Location: [place];  Service: Orthopedics;  Laterality: Right;  . ORIF ELBOW FRACTURE Right 06/19/2017   Procedure: OPEN REDUCTION ELBOW DISLOCATION WITH RADIAL HEAD REPLACEMENT;  Surgeon: Shona Needles, MD;  Location: [place];   Service: Orthopedics;  Laterality: Right;  . TEE WITHOUT CARDIOVERSION N/A 04/06/2015   Procedure: TRANSESOPHAGEAL ECHOCARDIOGRAM (TEE);  Surgeon: [name], MD;  Location: [place];  Service: Open Heart Surgery;  Laterality: N/A;  . TONSILLECTOMY    . UMBILICAL HERNIA REPAIR      There were no vitals filed for this visit.  Subjective Assessment - 11/08/17 1020    Subjective  "Doing good"    Currently in Pain?  Yes    Pain Score  2     Pain Location  Shoulder    Pain Orientation  Right         [facility] PT Assessment - 11/08/17 0001      AROM   Right Shoulder Flexion  160 Degrees   Pain from 90deg and up   Right Shoulder ABduction  165 Degrees    Right Shoulder Internal Rotation  73 Degrees    Right Shoulder External Rotation  79 Degrees                   [facility] Adult PT Treatment/Exercise - 11/08/17 0001      Exercises   Exercises  Shoulder      Shoulder Exercises: Supine   External Rotation  Right;10 reps;Weights   x2   External Rotation Weight (lbs)  1    Internal Rotation  Right;10 reps;Weights   x2   Internal Rotation Weight (lbs)  1      Shoulder Exercises: Standing   Flexion  AROM;Strengthening;Both;10 reps;Weights    Shoulder Flexion Weight (lbs)  1lb    ABduction  AROM;Strengthening;10 reps;Weights    Shoulder ABduction Weight (lbs)  1 lb    Extension  Strengthening;Both;20 reps;Theraband    Theraband Level (Shoulder Extension)  Level 2 (Red)    Row  Strengthening;20 reps;Theraband    Theraband Level (Shoulder Row)  Level 3 (Green)    Other Standing Exercises  bicept curls 15lb 2x15     Other Standing Exercises  tricept press downs 25lb 2x15      Shoulder Exercises: ROM/Strengthening   UBE (Upper Arm Bike)  L2 59mn each way    Other ROM/Strengthening Exercises  Rows and lats 20lb 2x10     Other ROM/Strengthening Exercises  serratus presses 10lb 2x15               PT Short Term Goals - 11/08/17 1102      PT SHORT TERM GOAL #1    Title  independent with initial HEP    Status  Achieved        PT Long Term Goals - 11/08/17 1102      PT LONG TERM GOAL #1   Title  understand posture and body mechanics    Status  Partially Met      PT LONG TERM GOAL #2   Title  increase ER strength to 4/5    Status  On-going      PT LONG TERM GOAL #  3   Title  decrease pain 50% with reaching    Status  Partially Met      PT LONG TERM GOAL #4   Title  report being able to wash left underarm without pain or difficulty    Status  On-going            Plan - 11/08/17 1058    Clinical Impression Statement  Pt has progressed increasing his R shoulder AROM. He does report pain with flexion above 90. VC's given to keep shoulder back with both seated and standing rows. Pt did really well with supine external and internal rotation. Pt did fatigue quick with bicep curls and triceps extensions.     Rehab Potential  Good    PT Frequency  1x / week    PT Duration  8 weeks    PT Treatment/Interventions  ADLs/Self Care Home Management;Electrical Stimulation;Iontophoresis 48m/ml Dexamethasone;Moist Heat;Ultrasound;Therapeutic activities;Therapeutic exercise;Manual techniques;Vasopneumatic Device;Patient/family education    PT Next Visit Plan  Continue progressing scapular strengthening as pain allows       Patient will benefit from skilled therapeutic intervention in order to improve the following deficits and impairments:  Decreased range of motion, Impaired UE functional use, Increased muscle spasms, Pain, Decreased strength, Postural dysfunction  Visit Diagnosis: Acute pain of right shoulder  Stiffness of right shoulder, not elsewhere classified  Muscle weakness (generalized)     Problem List Patient Active Problem List   Diagnosis Date Noted  . Fall on and from ladder causing accidental injury, initial encounter 06/21/2017  . Closed displaced fracture of proximal phalanx of right little finger 06/21/2017  . Closed  displaced fracture of medial malleolus of right tibia 06/21/2017  . Closed displaced fracture of head of right radius 06/21/2017  . Fall from roof as cause of accidental injury 06/21/2017  . Closed fracture dislocation of elbow, right, initial encounter 06/18/2017  . Sinus bradycardia 06/01/2015  . Chronic systolic CHF (congestive heart failure) ([place]   . Ischemic cardiomyopathy   . S/P CABG x 4 04/06/2015  . CAD (coronary artery disease) 04/01/2015  . Chronic combined systolic and diastolic CHF (congestive heart failure) ([place] 12/28/2014  . Hyperlipidemia 12/28/2014  . Pulmonary embolism without acute cor pulmonale (HNorth Key L[place]   . Paroxysmal atrial fibrillation (HCC)   . Acute combined systolic and diastolic heart failure ([place]ning [place]   . Hematuria 12/08/2014  . Ventricular fibrillation (HEwa Vill[place] 12/08/2014  . CAD S/P percutaneous coronary angioplasty 12/08/2014  . ST elevation (STEMI) myocardial infarction involving right coronary artery (HWarren AFB 12/07/2014  . Complete heart block (HB[place] 12/07/2014  . Cardiogenic shock (HMount [place] 12/07/2014  . HTN (hypertension) 12/07/2014    RScot Jun PTA 11/08/2017, 11:02 AM  [place] [address]Phone: [phone]  Fax:  [phone] Name: Ryan MuilenburgMRN: [id number] of Birth: [birth year]

## 2017-11-18 DIAGNOSIS — S8251XD Displaced fracture of medial malleolus of right tibia, subsequent encounter for closed fracture with routine healing: Secondary | ICD-10-CM | POA: Diagnosis not present

## 2017-11-18 DIAGNOSIS — S52121D Displaced fracture of head of right radius, subsequent encounter for closed fracture with routine healing: Secondary | ICD-10-CM | POA: Diagnosis not present

## 2017-11-18 DIAGNOSIS — S62616D Displaced fracture of proximal phalanx of right little finger, subsequent encounter for fracture with routine healing: Secondary | ICD-10-CM | POA: Diagnosis not present

## 2017-11-20 ENCOUNTER — Ambulatory Visit: Payer: Medicare Other | Admitting: Physical Therapy

## 2017-11-20 DIAGNOSIS — M25611 Stiffness of right shoulder, not elsewhere classified: Secondary | ICD-10-CM | POA: Diagnosis not present

## 2017-11-20 DIAGNOSIS — M6281 Muscle weakness (generalized): Secondary | ICD-10-CM

## 2017-11-20 DIAGNOSIS — M25511 Pain in right shoulder: Secondary | ICD-10-CM | POA: Diagnosis not present

## 2017-11-20 NOTE — Therapy (Signed)
[address] [place], [place], [address] Phone: [phone]   Fax:  [phone]  Physical Therapy Treatment  Patient Details  Name: Ryan Ward MRN: [id number] Date of Birth: 08-05-1945 Referring Provider (PT): Haddix   Encounter Date: 11/20/2017  PT End of Session - 11/20/17 1015    Visit Number  5    Date for PT Re-Evaluation  12/02/17    PT Start Time  0942    PT Stop Time  1015    PT Time Calculation (min)  33 min    Activity Tolerance  Patient tolerated treatment well    Behavior During Therapy  [facility] for tasks assessed/performed       Past Medical History:  Diagnosis Date  . Asthma    VERY MILD  . CAD (coronary artery disease)    a. 12/07/14 Inf STEMI/PCI: LAD 85p/m, 28m/d, LCX 80ost, RCA 30p/m, 18m/d, 100d (4.0x20 Promus Premier DES). Hosp course complicated by VF arrest, CGS, CHB req temp wire; b. 03/2015 Cath: LAD 85p/m, 95d, LCx 80ost, RCA 30-40p/m/d, RPDA 75, EF 30-35%, Nl CO; c. 04/06/2015 CABGx4 (LIMA->LAD, VG->Diag, Seq VG->prox RPDA->mid RPDA.  [name] Chronic systolic CHF (congestive heart failure) ([place])    a. 03/2015 Echo: EF 30-35%, diff HK.  [name] DVT (deep venous thrombosis) ([place]) 12/2014   RLE  . History of blood transfusion 04/2013   S/P CABG  . Hyperlipidemia   . Hypertension   . Hypertensive heart disease   . Ischemic cardiomyopathy    a. 03/2015 Echo: EF 30-35%, diff HK, mild MR, mod dil RA, PASP 10mmHg.  . Myocardial infarction ([place]) 12/2014  . OSA (obstructive sleep apnea)    a. not using CPAP at home, does have machine (06/20/2017)  . Paroxysmal atrial fibrillation ([place])    a. 12/2014 In setting of Inf MI->convertred spont.  . Pulmonary embolism ([place])    a. 12/2014 R PT and peroneal vein DVT and RUL PE-->Xarelto x 3 mos.    Past Surgical History:  Procedure Laterality Date  . CARDIAC CATHETERIZATION N/A 12/07/2014   Procedure: Coronary Stent Intervention;  Surgeon: Peter M Martinique,  MD;  Location: [place] [facility];  Service: Cardiovascular;  Laterality: N/A;  STEMI  . CARDIAC CATHETERIZATION N/A 12/07/2014   Procedure: IABP Insertion;  Surgeon: Peter M Martinique, MD;  Location: [place] [facility];  Service: Cardiovascular;  Laterality: N/A;  . CARDIAC CATHETERIZATION N/A 12/07/2014   Procedure: Temporary Pacemaker;  Surgeon: Peter M Martinique, MD;  Location: [place] [facility];  Service: Cardiovascular;  Laterality: N/A;  . CARDIAC CATHETERIZATION N/A 12/07/2014   Procedure: Left Heart Cath and Coronary Angiography;  Surgeon: Peter M Martinique, MD;  Location: [place] [facility];  Service: Cardiovascular;  Laterality: N/A;  . CARDIAC CATHETERIZATION N/A 03/29/2015   Procedure: Right/Left Heart Cath and Coronary Angiography;  Surgeon: Peter M Martinique, MD;  Location: [place] [facility];  Service: Cardiovascular;  Laterality: N/A;  . CORONARY ARTERY BYPASS GRAFT N/A 04/06/2015   Procedure: CORONARY ARTERY BYPASS GRAFT TIMES FOUR  USING LEFT INTERNAL MAMMARY ARTERY TO THE LEFT ANTERIOR DESCENDING, BILATERAL GREATER SAPHENOUS ENDOVEIN HARVEST GRAFT TO PROXIMAL AND MID POSTERIOR DESCENDING AND DIAGONAL CORONARY ARTERIES AND PLACEMENT OR RIGHT FEMORAL A-LINE. ;  Surgeon: [name], MD;  Location: [place];  Service: Open Heart Surgery;  Laterality: N/A;  . FRACTURE SURGERY    . HERNIA REPAIR    . OPEN REDUCTION INTERNAL FIXATION (ORIF) PROXIMAL PHALANX  Right 06/19/2017   Procedure: OPEN REDUCTION INTERNAL FIXATION (ORIF) PROXIMAL PHALANX;  Surgeon: Shona Needles, MD;  Location: [facility];  Service: Orthopedics;  Laterality: Right;  . ORIF ANKLE FRACTURE Right 06/19/2017   Procedure: OPEN REDUCTION INTERNAL FIXATION (ORIF) ANKLE FRACTURE;  Surgeon: Shona Needles, MD;  Location: [place];  Service: Orthopedics;  Laterality: Right;  . ORIF ELBOW FRACTURE Right 06/19/2017   Procedure: OPEN REDUCTION ELBOW DISLOCATION WITH RADIAL HEAD REPLACEMENT;  Surgeon: Shona Needles, MD;  Location: [place];   Service: Orthopedics;  Laterality: Right;  . TEE WITHOUT CARDIOVERSION N/A 04/06/2015   Procedure: TRANSESOPHAGEAL ECHOCARDIOGRAM (TEE);  Surgeon: [name], MD;  Location: [place];  Service: Open Heart Surgery;  Laterality: N/A;  . TONSILLECTOMY    . UMBILICAL HERNIA REPAIR      There were no vitals filed for this visit.  Subjective Assessment - 11/20/17 0943    Subjective  "Iv been getting a little better every day"    Currently in Pain?  Yes    Pain Score  1     Pain Location  Elbow    Pain Orientation  Right                       [facility] Adult PT Treatment/Exercise - 11/20/17 0001      Shoulder Exercises: Standing   External Rotation  AROM;Strengthening;20 reps;Theraband    Theraband Level (Shoulder External Rotation)  Level 1 (Yellow)    Internal Rotation  AROM;Strengthening;20 reps;Theraband    Theraband Level (Shoulder Internal Rotation)  Level 1 (Yellow)    Flexion  AROM;Strengthening;Both;10 reps;Weights    Shoulder Flexion Weight (lbs)  1lb    ABduction  AROM;Strengthening;10 reps;Weights    Shoulder ABduction Weight (lbs)  1 lb    Extension  Strengthening;Both;20 reps;Theraband    Theraband Level (Shoulder Extension)  Level 3 (Green)    Other Standing Exercises  bicept curls 15lb 2x15; Traps 2x10 5lb dumbells     Other Standing Exercises  tricept press downs 35lb 2x15 (using curl bar)      Shoulder Exercises: ROM/Strengthening   UBE (Upper Arm Bike)  L2 55min each way    Other ROM/Strengthening Exercises  Rows and lats 25lb 2x10     Other ROM/Strengthening Exercises  serratus presses 10lb 2x15               PT Short Term Goals - 11/08/17 1102      PT SHORT TERM GOAL #1   Title  independent with initial HEP    Status  Achieved        PT Long Term Goals - 11/20/17 1022      PT LONG TERM GOAL #4   Title  report being able to wash left underarm without pain or difficulty    Time  8    Period  Weeks    Status  Achieved             Plan - 11/20/17 1016    Clinical Impression Statement  Pt verbally stated that bathing is getting alot easier. Pt also reported that after talking to the Dr. on Monday that they will not extend PT treatment after pt finishes the ones already booked. Pt required min. VC to minimize arm and shoulder compensation during external rotations. Pt continues to lack full external rotation as seen with shoulder tband exercises. Pt continues to slowely progress towards all therapy goals at this time.  Rehab Potential  Good    PT Frequency  1x / week    PT Duration  8 weeks    PT Treatment/Interventions  ADLs/Self Care Home Management;Electrical Stimulation;Iontophoresis 4mg /ml Dexamethasone;Moist Heat;Ultrasound;Therapeutic activities;Therapeutic exercise;Manual techniques;Vasopneumatic Device;Patient/family education    PT Next Visit Plan  Continue progressing as pain allows    Consulted and Agree with Plan of Care  Patient       Patient will benefit from skilled therapeutic intervention in order to improve the following deficits and impairments:  Decreased range of motion, Impaired UE functional use, Increased muscle spasms, Pain, Decreased strength, Postural dysfunction  Visit Diagnosis: Acute pain of right shoulder  Stiffness of right shoulder, not elsewhere classified  Muscle weakness (generalized)     Problem List Patient Active Problem List   Diagnosis Date Noted  . Fall on and from ladder causing accidental injury, initial encounter 06/21/2017  . Closed displaced fracture of proximal phalanx of right little finger 06/21/2017  . Closed displaced fracture of medial malleolus of right tibia 06/21/2017  . Closed displaced fracture of head of right radius 06/21/2017  . Fall from roof as cause of accidental injury 06/21/2017  . Closed fracture dislocation of elbow, right, initial encounter 06/18/2017  . Sinus bradycardia 06/01/2015  . Chronic systolic CHF (congestive heart  failure) ([place])   . Ischemic cardiomyopathy   . S/P CABG x 4 04/06/2015  . CAD (coronary artery disease) 04/01/2015  . Chronic combined systolic and diastolic CHF (congestive heart failure) ([place]) 12/28/2014  . Hyperlipidemia 12/28/2014  . Pulmonary embolism without acute cor pulmonale ([place])   . Paroxysmal atrial fibrillation ([facility])   . Acute combined systolic and diastolic heart failure ([place])   . Hematuria 12/08/2014  . Ventricular fibrillation ([place]) 12/08/2014  . CAD S/P percutaneous coronary angioplasty 12/08/2014  . ST elevation (STEMI) myocardial infarction involving right coronary artery ([place]) 12/07/2014  . Complete heart block ([place]) 12/07/2014  . Cardiogenic shock ([place]) 12/07/2014  . HTN (hypertension) 12/07/2014    Ryan Ward, Ryan Ward 11/20/2017, 10:22 AM  [place] [address] [place], [place], [address] Phone: [phone]   Fax:  [phone]  Name: Ryan Ward MRN: [id number] Date of Birth: 13-Nov-1945

## 2017-11-22 ENCOUNTER — Encounter: Payer: Self-pay | Admitting: Physical Therapy

## 2017-11-22 ENCOUNTER — Ambulatory Visit: Payer: Medicare Other | Admitting: Physical Therapy

## 2017-11-22 DIAGNOSIS — M25611 Stiffness of right shoulder, not elsewhere classified: Secondary | ICD-10-CM | POA: Diagnosis not present

## 2017-11-22 DIAGNOSIS — M25511 Pain in right shoulder: Secondary | ICD-10-CM

## 2017-11-22 DIAGNOSIS — M6281 Muscle weakness (generalized): Secondary | ICD-10-CM | POA: Diagnosis not present

## 2017-11-22 NOTE — Therapy (Signed)
[facility] [place] [address] [place], [place], [address] Phone: [phone]   Fax:  [phone]  Physical Therapy Treatment  Patient Details  Name: Ryan Ward MRN: [id number] Date of Birth: 12/14/1945 Referring Provider (PT): Haddix   Encounter Date: 11/22/2017  PT End of Session - 11/22/17 1004    Visit Number  6    Date for PT Re-Evaluation  12/02/17    PT Start Time  0930    PT Stop Time  1010    PT Time Calculation (min)  40 min    Activity Tolerance  Patient tolerated treatment well    Behavior During Therapy  [facility] for tasks assessed/performed       Past Medical History:  Diagnosis Date  . Asthma    VERY MILD  . CAD (coronary artery disease)    a. 12/07/14 Inf STEMI/PCI: LAD 85p/m, 57m/d, LCX 80ost, RCA 30p/m, 65m/d, 100d (4.0x20 Promus Premier DES). Hosp course complicated by VF arrest, CGS, CHB req temp wire; b. 03/2015 Cath: LAD 85p/m, 95d, LCx 80ost, RCA 30-40p/m/d, RPDA 75, EF 30-35%, Nl CO; c. 04/06/2015 CABGx4 (LIMA->LAD, VG->Diag, Seq VG->prox RPDA->mid RPDA.  [name] Chronic systolic CHF (congestive heart failure) ([place])    a. 03/2015 Echo: EF 30-35%, diff HK.  [name] DVT (deep venous thrombosis) ([place]) 12/2014   RLE  . History of blood transfusion 04/2013   S/P CABG  . Hyperlipidemia   . Hypertension   . Hypertensive heart disease   . Ischemic cardiomyopathy    a. 03/2015 Echo: EF 30-35%, diff HK, mild MR, mod dil RA, PASP 11mmHg.  . Myocardial infarction ([place]) 12/2014  . OSA (obstructive sleep apnea)    a. not using CPAP at home, does have machine (06/20/2017)  . Paroxysmal atrial fibrillation ([place])    a. 12/2014 In setting of Inf MI->convertred spont.  . Pulmonary embolism ([place])    a. 12/2014 R PT and peroneal vein DVT and RUL PE-->Xarelto x 3 mos.    Past Surgical History:  Procedure Laterality Date  . CARDIAC CATHETERIZATION N/A 12/07/2014   Procedure: Coronary Stent Intervention;  Surgeon: Peter M Martinique,  MD;  Location: [place] [facility];  Service: Cardiovascular;  Laterality: N/A;  STEMI  . CARDIAC CATHETERIZATION N/A 12/07/2014   Procedure: IABP Insertion;  Surgeon: Peter M Martinique, MD;  Location: [place] [facility];  Service: Cardiovascular;  Laterality: N/A;  . CARDIAC CATHETERIZATION N/A 12/07/2014   Procedure: Temporary Pacemaker;  Surgeon: Peter M Martinique, MD;  Location: [facility] [facility];  Service: Cardiovascular;  Laterality: N/A;  . CARDIAC CATHETERIZATION N/A 12/07/2014   Procedure: Left Heart Cath and Coronary Angiography;  Surgeon: Peter M Martinique, MD;  Location: [place] [facility];  Service: Cardiovascular;  Laterality: N/A;  . CARDIAC CATHETERIZATION N/A 03/29/2015   Procedure: Right/Left Heart Cath and Coronary Angiography;  Surgeon: Peter M Martinique, MD;  Location: [place] [facility];  Service: Cardiovascular;  Laterality: N/A;  . CORONARY ARTERY BYPASS GRAFT N/A 04/06/2015   Procedure: CORONARY ARTERY BYPASS GRAFT TIMES FOUR  USING LEFT INTERNAL MAMMARY ARTERY TO THE LEFT ANTERIOR DESCENDING, BILATERAL GREATER SAPHENOUS ENDOVEIN HARVEST GRAFT TO PROXIMAL AND MID POSTERIOR DESCENDING AND DIAGONAL CORONARY ARTERIES AND PLACEMENT OR RIGHT FEMORAL A-LINE. ;  Surgeon: [name], MD;  Location: [facility];  Service: Open Heart Surgery;  Laterality: N/A;  . FRACTURE SURGERY    . HERNIA REPAIR    . OPEN REDUCTION INTERNAL FIXATION (ORIF) PROXIMAL PHALANX  Right 06/19/2017   Procedure: OPEN REDUCTION INTERNAL FIXATION (ORIF) PROXIMAL PHALANX;  Surgeon: Shona Needles, MD;  Location: [facility];  Service: Orthopedics;  Laterality: Right;  . ORIF ANKLE FRACTURE Right 06/19/2017   Procedure: OPEN REDUCTION INTERNAL FIXATION (ORIF) ANKLE FRACTURE;  Surgeon: Shona Needles, MD;  Location: [facility] [place];  Service: Orthopedics;  Laterality: Right;  . ORIF ELBOW FRACTURE Right 06/19/2017   Procedure: OPEN REDUCTION ELBOW DISLOCATION WITH RADIAL HEAD REPLACEMENT;  Surgeon: Shona Needles, MD;  Location: [facility];   Service: Orthopedics;  Laterality: Right;  . TEE WITHOUT CARDIOVERSION N/A 04/06/2015   Procedure: TRANSESOPHAGEAL ECHOCARDIOGRAM (TEE);  Surgeon: [name], MD;  Location: [facility];  Service: Open Heart Surgery;  Laterality: N/A;  . TONSILLECTOMY    . UMBILICAL HERNIA REPAIR      There were no vitals filed for this visit.  Subjective Assessment - 11/22/17 0935    Subjective  "Shoulder is doing fine, some soreness"    Currently in Pain?  No/denies         [facility] PT Assessment - 11/22/17 0001      AROM   Right Shoulder Flexion  165 Degrees    Right Shoulder ABduction  161 Degrees                   [facility] Adult PT Treatment/Exercise - 11/22/17 0001      Exercises   Exercises  Shoulder      Shoulder Exercises: Supine   External Rotation  Right;Weights;20 reps    External Rotation Weight (lbs)  2    Internal Rotation  Right;10 reps;Weights   x2   Internal Rotation Weight (lbs)  2      Shoulder Exercises: Standing   Flexion  AROM;Strengthening;Both;10 reps;Weights    Shoulder Flexion Weight (lbs)  1lb    ABduction  AROM;Strengthening;10 reps;Weights    Shoulder ABduction Weight (lbs)  1 lb    Extension  Strengthening;Theraband;Right;10 reps    Extension Weight (lbs)  15    Row  Weights;Right;Strengthening;15 reps   x2   Row Weight (lbs)  15    Other Standing Exercises  3 level cabinet reaches flex & abd x10 then x4       Shoulder Exercises: ROM/Strengthening   UBE (Upper Arm Bike)  L4 29min each way    Other ROM/Strengthening Exercises  Rows and lats 25lb 2x10                PT Short Term Goals - 11/08/17 1102      PT SHORT TERM GOAL #1   Title  independent with initial HEP    Status  Achieved        PT Long Term Goals - 11/22/17 8299      PT LONG TERM GOAL #1   Title  understand posture and body mechanics            Plan - 11/22/17 1005    Clinical Impression Statement  Pt with little R shoulder AROM improvement. He has a  painful arc from ~ 90 to 150 degrees with flexion and abduction. Some shoulder elevation with active shoulder flexion and abduction. Pt with some difficulty with 3 level cabinet reaches. Reports some pain at the end range of external rotation in supine. Pt has full R shoulder PROM.    Rehab Potential  Good    PT Frequency  1x / week    PT Duration  8 weeks    PT  Treatment/Interventions  ADLs/Self Care Home Management;Electrical Stimulation;Iontophoresis 4mg /ml Dexamethasone;Moist Heat;Ultrasound;Therapeutic activities;Therapeutic exercise;Manual techniques;Vasopneumatic Device;Patient/family education    PT Next Visit Plan  Continue progressing as pain allows       Patient will benefit from skilled therapeutic intervention in order to improve the following deficits and impairments:  Decreased range of motion, Impaired UE functional use, Increased muscle spasms, Pain, Decreased strength, Postural dysfunction  Visit Diagnosis: Acute pain of right shoulder  Stiffness of right shoulder, not elsewhere classified  Muscle weakness (generalized)     Problem List Patient Active Problem List   Diagnosis Date Noted  . Fall on and from ladder causing accidental injury, initial encounter 06/21/2017  . Closed displaced fracture of proximal phalanx of right little finger 06/21/2017  . Closed displaced fracture of medial malleolus of right tibia 06/21/2017  . Closed displaced fracture of head of right radius 06/21/2017  . Fall from roof as cause of accidental injury 06/21/2017  . Closed fracture dislocation of elbow, right, initial encounter 06/18/2017  . Sinus bradycardia 06/01/2015  . Chronic systolic CHF (congestive heart failure) ([place])   . Ischemic cardiomyopathy   . S/P CABG x 4 04/06/2015  . CAD (coronary artery disease) 04/01/2015  . Chronic combined systolic and diastolic CHF (congestive heart failure) ([place]) 12/28/2014  . Hyperlipidemia 12/28/2014  . Pulmonary embolism without acute cor  pulmonale ([place])   . Paroxysmal atrial fibrillation ([facility])   . Acute combined systolic and diastolic heart failure ([place])   . Hematuria 12/08/2014  . Ventricular fibrillation ([place]) 12/08/2014  . CAD S/P percutaneous coronary angioplasty 12/08/2014  . ST elevation (STEMI) myocardial infarction involving right coronary artery ([place]) 12/07/2014  . Complete heart block ([place]) 12/07/2014  . Cardiogenic shock ([place]) 12/07/2014  . HTN (hypertension) 12/07/2014    Scot Jun, PTA 11/22/2017, 10:08 AM  [facility] [address] [place], [place], [address] Phone: [phone]   Fax:  [phone]  Name: Ryan Ward MRN: [id number] Date of Birth: June 10, 1945

## 2017-11-25 ENCOUNTER — Ambulatory Visit: Payer: Medicare Other | Admitting: Physical Therapy

## 2017-11-25 DIAGNOSIS — M25511 Pain in right shoulder: Secondary | ICD-10-CM | POA: Diagnosis not present

## 2017-11-25 DIAGNOSIS — M6281 Muscle weakness (generalized): Secondary | ICD-10-CM

## 2017-11-25 DIAGNOSIS — M25611 Stiffness of right shoulder, not elsewhere classified: Secondary | ICD-10-CM

## 2017-11-25 NOTE — Therapy (Signed)
[address] [place], [place], [address] Phone: [phone]   Fax:  [phone]  Physical Therapy Treatment  Patient Details  Name: Ryan Ward MRN: [id number] Date of Birth: Oct 26, 1945 Referring Provider (PT): Haddix   Encounter Date: 11/25/2017  PT End of Session - 11/25/17 1011    Visit Number  7    Date for PT Re-Evaluation  12/02/17    PT Start Time  0930    PT Stop Time  1012    PT Time Calculation (min)  42 min    Activity Tolerance  Patient tolerated treatment well    Behavior During Therapy  [facility] for tasks assessed/performed       Past Medical History:  Diagnosis Date  . Asthma    VERY MILD  . CAD (coronary artery disease)    a. 12/07/14 Inf STEMI/PCI: LAD 85p/m, 43m/d, LCX 80ost, RCA 30p/m, 73m/d, 100d (4.0x20 Promus Premier DES). Hosp course complicated by VF arrest, CGS, CHB req temp wire; b. 03/2015 Cath: LAD 85p/m, 95d, LCx 80ost, RCA 30-40p/m/d, RPDA 75, EF 30-35%, Nl CO; c. 04/06/2015 CABGx4 (LIMA->LAD, VG->Diag, Seq VG->prox RPDA->mid RPDA.  [name] Chronic systolic CHF (congestive heart failure) ([place])    a. 03/2015 Echo: EF 30-35%, diff HK.  [name] DVT (deep venous thrombosis) ([place]) 12/2014   RLE  . History of blood transfusion 04/2013   S/P CABG  . Hyperlipidemia   . Hypertension   . Hypertensive heart disease   . Ischemic cardiomyopathy    a. 03/2015 Echo: EF 30-35%, diff HK, mild MR, mod dil RA, PASP 49mmHg.  . Myocardial infarction ([place]) 12/2014  . OSA (obstructive sleep apnea)    a. not using CPAP at home, does have machine (06/20/2017)  . Paroxysmal atrial fibrillation ([place])    a. 12/2014 In setting of Inf MI->convertred spont.  . Pulmonary embolism ([place])    a. 12/2014 R PT and peroneal vein DVT and RUL PE-->Xarelto x 3 mos.    Past Surgical History:  Procedure Laterality Date  . CARDIAC CATHETERIZATION N/A 12/07/2014   Procedure: Coronary Stent Intervention;  Surgeon: Peter M Martinique,  MD;  Location: [place] [facility];  Service: Cardiovascular;  Laterality: N/A;  STEMI  . CARDIAC CATHETERIZATION N/A 12/07/2014   Procedure: IABP Insertion;  Surgeon: Peter M Martinique, MD;  Location: [place] [facility];  Service: Cardiovascular;  Laterality: N/A;  . CARDIAC CATHETERIZATION N/A 12/07/2014   Procedure: Temporary Pacemaker;  Surgeon: Peter M Martinique, MD;  Location: [place] [facility];  Service: Cardiovascular;  Laterality: N/A;  . CARDIAC CATHETERIZATION N/A 12/07/2014   Procedure: Left Heart Cath and Coronary Angiography;  Surgeon: Peter M Martinique, MD;  Location: [place] [facility];  Service: Cardiovascular;  Laterality: N/A;  . CARDIAC CATHETERIZATION N/A 03/29/2015   Procedure: Right/Left Heart Cath and Coronary Angiography;  Surgeon: Peter M Martinique, MD;  Location: [place] [facility];  Service: Cardiovascular;  Laterality: N/A;  . CORONARY ARTERY BYPASS GRAFT N/A 04/06/2015   Procedure: CORONARY ARTERY BYPASS GRAFT TIMES FOUR  USING LEFT INTERNAL MAMMARY ARTERY TO THE LEFT ANTERIOR DESCENDING, BILATERAL GREATER SAPHENOUS ENDOVEIN HARVEST GRAFT TO PROXIMAL AND MID POSTERIOR DESCENDING AND DIAGONAL CORONARY ARTERIES AND PLACEMENT OR RIGHT FEMORAL A-LINE. ;  Surgeon: [name], MD;  Location: [place];  Service: Open Heart Surgery;  Laterality: N/A;  . FRACTURE SURGERY    . HERNIA REPAIR    . OPEN REDUCTION INTERNAL FIXATION (ORIF) PROXIMAL PHALANX  Right 06/19/2017   Procedure: OPEN REDUCTION INTERNAL FIXATION (ORIF) PROXIMAL PHALANX;  Surgeon: Shona Needles, MD;  Location: [place];  Service: Orthopedics;  Laterality: Right;  . ORIF ANKLE FRACTURE Right 06/19/2017   Procedure: OPEN REDUCTION INTERNAL FIXATION (ORIF) ANKLE FRACTURE;  Surgeon: Shona Needles, MD;  Location: [facility];  Service: Orthopedics;  Laterality: Right;  . ORIF ELBOW FRACTURE Right 06/19/2017   Procedure: OPEN REDUCTION ELBOW DISLOCATION WITH RADIAL HEAD REPLACEMENT;  Surgeon: Shona Needles, MD;  Location: [facility];   Service: Orthopedics;  Laterality: Right;  . TEE WITHOUT CARDIOVERSION N/A 04/06/2015   Procedure: TRANSESOPHAGEAL ECHOCARDIOGRAM (TEE);  Surgeon: [name], MD;  Location: [facility];  Service: Open Heart Surgery;  Laterality: N/A;  . TONSILLECTOMY    . UMBILICAL HERNIA REPAIR      There were no vitals filed for this visit.  Subjective Assessment - 11/25/17 0933    Subjective  "I feel like im moving in the right direction"    Currently in Pain?  Yes    Pain Score  1     Pain Location  Elbow    Pain Orientation  Right                       [facility] Adult PT Treatment/Exercise - 11/25/17 0001      Shoulder Exercises: Supine   External Rotation  Right;Weights;20 reps    External Rotation Weight (lbs)  2    Internal Rotation  Right;10 reps;Weights    Internal Rotation Weight (lbs)  2      Shoulder Exercises: Standing   Flexion  AROM;Strengthening;Both;10 reps;Weights   2 sets   Shoulder Flexion Weight (lbs)  1lb    ABduction  AROM;Strengthening;10 reps;Weights   2 sets   Shoulder ABduction Weight (lbs)  1 lb    Shoulder Elevation  AROM;Strengthening;Both;10 reps;Standing   2 sets, 5 lb dumbells   Other Standing Exercises  Bicept curl 20# 2x10; wall walks (yellow Tband)    Other Standing Exercises  Tricept Ext. 45# 2x10 (using rope); Ball rolls A-Z green ball      Shoulder Exercises: ROM/Strengthening   UBE (Upper Arm Bike)  L4 34min each way    Other ROM/Strengthening Exercises  Rows and lats 25lb 3x10     Other ROM/Strengthening Exercises  serratus presses 10lb 2x15               PT Short Term Goals - 11/08/17 1102      PT SHORT TERM GOAL #1   Title  independent with initial HEP    Status  Achieved        PT Long Term Goals - 11/25/17 1015      PT LONG TERM GOAL #1   Title  understand posture and body mechanics    Time  8    Period  Weeks    Status  Achieved            Plan - 11/25/17 1011    Clinical Impression Statement  Pt  continues to demonstrate some pain with shoulder flex, abd, and external rotation exercises. Pt required rest break x1 with ball rolls and wall walks due to R UE fatigue. Pt continues to compensate with R shoulder elevation when doing shoulder abduction and flexion exercises. Pt continues to slowly progress towards therapy goals at this time.     Rehab Potential  Good    PT Frequency  1x / week  PT Duration  8 weeks    PT Treatment/Interventions  ADLs/Self Care Home Management;Electrical Stimulation;Iontophoresis 4mg /ml Dexamethasone;Moist Heat;Ultrasound;Therapeutic activities;Therapeutic exercise;Manual techniques;Vasopneumatic Device;Patient/family education    PT Next Visit Plan  Continue progressing as pain allows    Consulted and Agree with Plan of Care  Patient       Patient will benefit from skilled therapeutic intervention in order to improve the following deficits and impairments:  Decreased range of motion, Impaired UE functional use, Increased muscle spasms, Pain, Decreased strength, Postural dysfunction  Visit Diagnosis: Acute pain of right shoulder  Stiffness of right shoulder, not elsewhere classified  Muscle weakness (generalized)     Problem List Patient Active Problem List   Diagnosis Date Noted  . Fall on and from ladder causing accidental injury, initial encounter 06/21/2017  . Closed displaced fracture of proximal phalanx of right little finger 06/21/2017  . Closed displaced fracture of medial malleolus of right tibia 06/21/2017  . Closed displaced fracture of head of right radius 06/21/2017  . Fall from roof as cause of accidental injury 06/21/2017  . Closed fracture dislocation of elbow, right, initial encounter 06/18/2017  . Sinus bradycardia 06/01/2015  . Chronic systolic CHF (congestive heart failure) ([place])   . Ischemic cardiomyopathy   . S/P CABG x 4 04/06/2015  . CAD (coronary artery disease) 04/01/2015  . Chronic combined systolic and diastolic CHF  (congestive heart failure) ([place]) 12/28/2014  . Hyperlipidemia 12/28/2014  . Pulmonary embolism without acute cor pulmonale ([place])   . Paroxysmal atrial fibrillation ([facility])   . Acute combined systolic and diastolic heart failure ([place])   . Hematuria 12/08/2014  . Ventricular fibrillation ([place]) 12/08/2014  . CAD S/P percutaneous coronary angioplasty 12/08/2014  . ST elevation (STEMI) myocardial infarction involving right coronary artery ([place]) 12/07/2014  . Complete heart block ([place]) 12/07/2014  . Cardiogenic shock ([place]) 12/07/2014  . HTN (hypertension) 12/07/2014    Howell Rucks, SPTA 11/25/2017, 10:16 AM  [address] [place], [place], [address] Phone: [phone]   Fax:  [phone]  Name: Ryan Ward MRN: [id number] Date of Birth: 1945-03-05

## 2017-11-27 ENCOUNTER — Ambulatory Visit: Payer: Medicare Other | Admitting: Physical Therapy

## 2017-11-27 DIAGNOSIS — M6281 Muscle weakness (generalized): Secondary | ICD-10-CM

## 2017-11-27 DIAGNOSIS — M25511 Pain in right shoulder: Secondary | ICD-10-CM | POA: Diagnosis not present

## 2017-11-27 DIAGNOSIS — M25611 Stiffness of right shoulder, not elsewhere classified: Secondary | ICD-10-CM

## 2017-11-27 NOTE — Therapy (Signed)
[address] [place], [place], [address] Phone: [phone]   Fax:  [phone]  Physical Therapy Treatment  Patient Details  Name: Ryan Ward MRN: [id number] Date of Birth: [birth year] Referring Provider (PT): Haddix   Encounter Date: 11/27/2017  PT End of Session - 11/27/17 1014    Visit Number  8    Date for PT Re-Evaluation  12/02/17    PT Start Time  0934    PT Stop Time  1015    PT Time Calculation (min)  41 min    Activity Tolerance  Patient tolerated treatment well    Behavior During Therapy  [facility] for tasks assessed/performed       Past Medical History:  Diagnosis Date  . Asthma    VERY MILD  . CAD (coronary artery disease)    a. 12/07/14 Inf STEMI/PCI: LAD 85p/m, 62md, LCX 80ost, RCA 30p/m, 470m, 100d (4.0x20 Promus Premier DES). Hosp course complicated by VF arrest, CGS, CHB req temp wire; b. 03/2015 Cath: LAD 85p/m, 95d, LCx 80ost, RCA 30-40p/m/d, RPDA 75, EF 30-35%, Nl CO; c. 04/06/2015 CABGx4 (LIMA->LAD, VG->Diag, Seq VG->prox RPDA->mid RPDA.  . [name] Kitchenhronic systolic CHF (congestive heart failure) ([place]   a. 03/2015 Echo: EF 30-35%, diff HK.  . [name] KitchenVT (deep venous thrombosis) ([place]12/2016   RLE  . History of blood transfusion 04/2013   S/P CABG  . Hyperlipidemia   . Hypertension   . Hypertensive heart disease   . Ischemic cardiomyopathy    a. 03/2015 Echo: EF 30-35%, diff HK, mild MR, mod dil RA, PASP 4945m.  . Myocardial infarction ([facility]CC[facility]2/2016  . OSA (obstructive sleep apnea)    a. not using CPAP at home, does have machine (06/20/2017)  . Paroxysmal atrial fibrillation ([facility]CC[place]  a. 12/2014 In setting of Inf MI->convertred spont.  . Pulmonary embolism ([facility]  a. 12/2014 R PT and peroneal vein DVT and RUL PE-->Xarelto x 3 mos.    Past Surgical History:  Procedure Laterality Date  . CARDIAC CATHETERIZATION N/A 12/07/2014   Procedure: Coronary Stent Intervention;  Surgeon: Peter M JorMartiniqueMD;  [facility];  Service: Cardiovascular;  Laterality: N/A;  STEMI  . CARDIAC CATHETERIZATION N/A 12/07/2014   Procedure: IABP Insertion;  Surgeon: Peter M JorMartiniqueD;  [facility];  Service: Cardiovascular;  Laterality: N/A;  . CARDIAC CATHETERIZATION N/A 12/07/2014   Procedure: Temporary Pacemaker;  Surgeon: Peter M JorMartiniqueD;  [facility];  Service: Cardiovascular;  Laterality: N/A;  . CARDIAC CATHETERIZATION N/A 12/07/2014   Procedure: Left Heart Cath and Coronary Angiography;  Surgeon: Peter M JorMartiniqueD;  [facility];  Service: Cardiovascular;  Laterality: N/A;  . CARDIAC CATHETERIZATION N/A 03/29/2015   Procedure: Right/Left Heart Cath and Coronary Angiography;  Surgeon: Peter M JorMartiniqueD;  [facility];  Service: Cardiovascular;  Laterality: N/A;  . CORONARY ARTERY BYPASS GRAFT N/A 04/06/2015   Procedure: CORONARY ARTERY BYPASS GRAFT TIMES FOUR  USING LEFT INTERNAL MAMMARY ARTERY TO THE LEFT ANTERIOR DESCENDING, BILATERAL GREATER SAPHENOUS ENDOVEIN HARVEST GRAFT TO PROXIMAL AND MID POSTERIOR DESCENDING AND DIAGONAL CORONARY ARTERIES AND PLACEMENT OR RIGHT FEMORAL A-LINE. ;  Surgeon: EdwGrace IsaacD;  [facility];  Laterality: N/A;  . FRACTURE SURGERY    . HERNIA REPAIR    . OPEN REDUCTION INTERNAL FIXATION (ORIF) PROXIMAL PHALANX  Right 06/19/2017   Procedure: OPEN REDUCTION INTERNAL FIXATION (ORIF) PROXIMAL PHALANX;  Surgeon: Shona Needles, MD;  Location: [facility];  Service: Orthopedics;  Laterality: Right;  . ORIF ANKLE FRACTURE Right 06/19/2017   Procedure: OPEN REDUCTION INTERNAL FIXATION (ORIF) ANKLE FRACTURE;  Surgeon: Shona Needles, MD;  Location: [facility];  Service: Orthopedics;  Laterality: Right;  . ORIF ELBOW FRACTURE Right 06/19/2017   Procedure: OPEN REDUCTION ELBOW DISLOCATION WITH RADIAL HEAD REPLACEMENT;  Surgeon: Shona Needles, MD;  Location: [facility];   Service: Orthopedics;  Laterality: Right;  . TEE WITHOUT CARDIOVERSION N/A 04/06/2015   Procedure: TRANSESOPHAGEAL ECHOCARDIOGRAM (TEE);  Surgeon: [name], MD;  Location: [facility];  Service: Open Heart Surgery;  Laterality: N/A;  . TONSILLECTOMY    . UMBILICAL HERNIA REPAIR      There were no vitals filed for this visit.  Subjective Assessment - 11/27/17 0936    Subjective  "Iv been good, I feel like I am a little bit better"    Currently in Pain?  Yes    Pain Score  1     Pain Location  Elbow                       [facility] Adult PT Treatment/Exercise - 11/27/17 0001      Exercises   Exercises  Elbow      Shoulder Exercises: Supine   External Rotation  Right;Weights;10 reps    External Rotation Weight (lbs)  3    Internal Rotation  Right;10 reps;Weights    Internal Rotation Weight (lbs)  3      Shoulder Exercises: Standing   External Rotation  AROM;Strengthening;Both;15 reps;Theraband   2x15   Theraband Level (Shoulder External Rotation)  Level 2 (Red)    Flexion  AROM;Strengthening;Both;10 reps;Weights    Shoulder Flexion Weight (lbs)  1lb    ABduction  AROM;Strengthening;10 reps;Weights    Shoulder ABduction Weight (lbs)  1 lb    Shoulder Elevation  AROM;Strengthening;Both;Standing;15 reps   Green Tband 2x15   Other Standing Exercises  Horizontal Add 1x10red Tband; shrugs 2x10 6lb dumbells    Other Standing Exercises  Ball rolls A-Z green ball; Tricep 45# 2x10      Shoulder Exercises: ROM/Strengthening   UBE (Upper Arm Bike)  L4 68mn each way    Other ROM/Strengthening Exercises  Rows and lats 25lb 3x10     Other ROM/Strengthening Exercises  serratus presses 10lb 2x15             PT Education - 11/27/17 1013    Education Details  Continue doing HEP    Person(s) Educated  Patient    Methods  Explanation    Comprehension  Verbalized understanding       PT Short Term Goals - 11/08/17 1102      PT SHORT TERM GOAL #1   Title  independent  with initial HEP    Status  Achieved        PT Long Term Goals - 11/27/17 1020      PT LONG TERM GOAL #3   Title  decrease pain 50% with reaching    Baseline  Still hurts with any kind of weight in hand    Time  8    Period  Weeks    Status  Partially Met            Plan - 11/27/17 1014    Clinical Impression Statement  Pt continues to have  some pain with R shoulder ER, flexion and abduction. Pt was able to preform R shoulder flexion and abduction with less compensation. Pt showed increased strength with R shoulder internal and external rotation as pt was able to increase weight in todays session as compared to previous. Pt reported that this is the last visit due to Dr. orders and that the Dr will see him again in June. Pt was instructed to continue doing HEP and call if he had any questions. Pt continues to be limited with R shoulder ROM but continues to slowely improve.     Rehab Potential  Good    PT Frequency  1x / week    PT Duration  8 weeks    PT Treatment/Interventions  ADLs/Self Care Home Management;Electrical Stimulation;Iontophoresis 25m/ml Dexamethasone;Moist Heat;Ultrasound;Therapeutic activities;Therapeutic exercise;Manual techniques;Vasopneumatic Device;Patient/family education    PT Next Visit Plan  Pt reported this was last session    Consulted and Agree with Plan of Care  Patient       Patient will benefit from skilled therapeutic intervention in order to improve the following deficits and impairments:  Decreased range of motion, Impaired UE functional use, Increased muscle spasms, Pain, Decreased strength, Postural dysfunction  Visit Diagnosis: Acute pain of right shoulder  Stiffness of right shoulder, not elsewhere classified  Muscle weakness (generalized)     Problem List Patient Active Problem List   Diagnosis Date Noted  . Fall on and from ladder causing accidental injury, initial encounter 06/21/2017  . Closed displaced fracture of proximal  phalanx of right little finger 06/21/2017  . Closed displaced fracture of medial malleolus of right tibia 06/21/2017  . Closed displaced fracture of head of right radius 06/21/2017  . Fall from roof as cause of accidental injury 06/21/2017  . Closed fracture dislocation of elbow, right, initial encounter 06/18/2017  . Sinus bradycardia 06/01/2015  . Chronic systolic CHF (congestive heart failure) ([facility]ast [place]   . Ischemic cardiomyopathy   . S/P CABG x 4 04/06/2015  . CAD (coronary artery disease) 04/01/2015  . Chronic combined systolic and diastolic CHF (congestive heart failure) ([place] 12/28/2014  . Hyperlipidemia 12/28/2014  . Pulmonary embolism without acute cor pulmonale ([facility]   . Paroxysmal atrial fibrillation ([facility])   . Acute combined systolic and diastolic heart failure ([place]   . Hematuria 12/08/2014  . Ventricular fibrillation ([place] 12/08/2014  . CAD S/P percutaneous coronary angioplasty 12/08/2014  . ST elevation (STEMI) myocardial infarction involving right coronary artery ([place] 12/07/2014  . Complete heart block ([place] 12/07/2014  . Cardiogenic shock ([place] 12/07/2014  . HTN (hypertension) 12/07/2014    JHowell Rucks SWilmot11/27/2019, 10:21 AM  [address] [address]Phone: [phone]  Fax:  [phone] Name: HJasper HanfMRN: [id number] of Birth: [birth year]

## 2018-01-17 ENCOUNTER — Other Ambulatory Visit: Payer: Self-pay | Admitting: Cardiology

## 2018-04-15 ENCOUNTER — Other Ambulatory Visit: Payer: Self-pay | Admitting: Cardiology

## 2018-04-15 NOTE — Telephone Encounter (Signed)
Losartan refilled 

## 2018-04-16 ENCOUNTER — Other Ambulatory Visit: Payer: Self-pay | Admitting: Cardiology

## 2018-04-16 NOTE — Telephone Encounter (Signed)
Furosemide  40 mg refilled 

## 2018-06-16 ENCOUNTER — Other Ambulatory Visit: Payer: Self-pay | Admitting: Cardiology

## 2018-06-17 DIAGNOSIS — S8251XD Displaced fracture of medial malleolus of right tibia, subsequent encounter for closed fracture with routine healing: Secondary | ICD-10-CM | POA: Diagnosis not present

## 2018-06-17 DIAGNOSIS — S52121D Displaced fracture of head of right radius, subsequent encounter for closed fracture with routine healing: Secondary | ICD-10-CM | POA: Diagnosis not present

## 2018-06-17 DIAGNOSIS — S62616D Displaced fracture of proximal phalanx of right little finger, subsequent encounter for fracture with routine healing: Secondary | ICD-10-CM | POA: Diagnosis not present

## 2018-06-18 DIAGNOSIS — I2510 Atherosclerotic heart disease of native coronary artery without angina pectoris: Secondary | ICD-10-CM | POA: Diagnosis not present

## 2018-06-18 DIAGNOSIS — E7800 Pure hypercholesterolemia, unspecified: Secondary | ICD-10-CM | POA: Diagnosis not present

## 2018-06-18 DIAGNOSIS — Z9861 Coronary angioplasty status: Secondary | ICD-10-CM | POA: Diagnosis not present

## 2018-06-18 LAB — HEPATIC FUNCTION PANEL
ALT: 23 IU/L (ref 0–44)
AST: 23 IU/L (ref 0–40)
Albumin: 4.2 g/dL (ref 3.7–4.7)
Alkaline Phosphatase: 79 IU/L (ref 39–117)
BILIRUBIN TOTAL: 0.4 mg/dL (ref 0.0–1.2)
BILIRUBIN, DIRECT: 0.16 mg/dL (ref 0.00–0.40)
Total Protein: 5.9 g/dL — ABNORMAL LOW (ref 6.0–8.5)

## 2018-06-18 LAB — LIPID PANEL
Chol/HDL Ratio: 2.3 ratio (ref 0.0–5.0)
Cholesterol, Total: 109 mg/dL (ref 100–199)
HDL: 48 mg/dL (ref >39)
LDL Calculated: 52 mg/dL (ref 0–99)
Triglycerides: 46 mg/dL (ref 0–149)
VLDL CHOLESTEROL CAL: 9 mg/dL (ref 5–40)

## 2018-06-23 NOTE — Progress Notes (Signed)
Notes recorded by Lendon Colonel, NP on 06/18/2018 at 4:12 PM EDT  Covering for Almyra Deforest, PA.   Labs reviewed. No changes in her regimen,

## 2018-06-24 ENCOUNTER — Telehealth: Payer: Self-pay | Admitting: Physician Assistant

## 2018-06-24 NOTE — Telephone Encounter (Signed)
Mychart, smartphone, consent, pre reg complete 06/24/18 AF

## 2018-06-25 ENCOUNTER — Other Ambulatory Visit: Payer: Self-pay | Admitting: Cardiology

## 2018-06-26 NOTE — Progress Notes (Signed)
Virtual Visit via Video Note   This visit type was conducted due to national recommendations for restrictions regarding the COVID-19 Pandemic (e.g. social distancing) in an effort to limit this patient's exposure and mitigate transmission in our community.  Due to his co-morbid illnesses, this patient is at least at moderate risk for complications without adequate follow up.  This format is felt to be most appropriate for this patient at this time.  All issues noted in this document were discussed and addressed.  A limited physical exam was performed with this format.  Please refer to the patient's chart for his consent to telehealth for [facility].   Date:  06/27/2018   ID:  Ryan Ward, DOB 08/07/1945, MRN [id number]  Patient Location: Home Provider Location: Office  PCP:  Leonard Downing, MD  Cardiologist:  Peter Martinique, MD  Electrophysiologist:  None   Evaluation Performed:  Follow-Up Visit  Chief Complaint:  Follow up CAD, sCHF  History of Present Illness:    Ryan Ward is a 73 y.o. male with past medical history significant for CAD, chronic systolic heart failure, history of DVT/PE, hypertension, hyperlipidemia, OSA, and PAF.  Patient had an inferior STEMI in December 2016.  Heart cath showed occluded RCA and severe LAD and circumflex disease.  RCA was treated with a DES.  Patient subsequently developed cardiogenic shock, ventricular fibrillation and complete heart block, as well as atrial fibrillation.  He also developed a right lower extremity DVT and right upper lobe PE.  He was treated with Xarelto.  A repeat cardiac catheterization March 2016 showed patent RCA stent with residual distal RPDA disease, proximal to mid LAD disease and left circumflex disease.  He underwent CABG x4 (LIMA to LAD, SVG to diagonal, sequential SVG to proximal RPDA and the mid RPDA).   Treated with amiodarone.  Follow-up, he developed heart failure treated with increased Lasix.  He developed  significant bradycardia and metoprolol and amiodarone were discontinued.  Follow-up echo in 07/2015 with improved EF to 35 to 40%.  ICD was deferred.  That monitor was negative for further recurrence of A. fib.  He was last seen in clinic by Almyra Deforest PA-C on 09/30/2017.  Unfortunately he had suffered a fall requiring right elbow surgery.   He presents today for follow up. Overall he is doing well from a cardiac perspective. He occasionally gets lightheaded when standing from bending over. This is baseline for him.  He has experienced very brief palpitations twice in the last year. We discussed his past Afib. He will call us if these become more frequent. He also wants to cut his dose of lasix to 20 mg. He is not currently weighing everyday. After review of his echocardiogram in 2017, I generally recommended against this. 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 I advised him that he needs a solid 7 days of daily weights and blood pressures. If his weight was completely solid for a week, he could try to cut his lasix to 20 mg. I discussed risks of this including heart failure exacerbation and potential hospitalization during a pandemic. He understands the risks. I let him know that 40 mg lasix has kept him very stable and I do not recommend changing it, but if he chooses to try to decrease I gave strict precautions on when to call  our office and when to go to the ER. He understands the plan.   The patient does not have symptoms concerning for COVID-19 infection (fever, chills, cough, or new shortness of breath).    Past Medical History:  Diagnosis Date   Asthma    VERY MILD   CAD (coronary artery disease)    a. 12/07/14 Inf STEMI/PCI: LAD 85p/m, 75m/d, LCX 80ost, RCA 30p/m, 42m/d, 100d (4.0x20 Promus Premier DES). Hosp course complicated by VF arrest, CGS, CHB req  temp wire; b. 03/2015 Cath: LAD 85p/m, 95d, LCx 80ost, RCA 30-40p/m/d, RPDA 75, EF 30-35%, Nl CO; c. 04/06/2015 CABGx4 (LIMA->LAD, VG->Diag, Seq VG->prox RPDA->mid RPDA.   Chronic systolic CHF (congestive heart failure) (Normal)    a. 03/2015 Echo: EF 30-35%, diff HK.   DVT (deep venous thrombosis) ([place]) 12/2014   RLE   History of blood transfusion 04/2013   S/P CABG   Hyperlipidemia    Hypertension    Hypertensive heart disease    Ischemic cardiomyopathy    a. 03/2015 Echo: EF 30-35%, diff HK, mild MR, mod dil RA, PASP 27mmHg.   Myocardial infarction ([place]) 12/2014   OSA (obstructive sleep apnea)    a. not using CPAP at home, does have machine (06/20/2017)   Paroxysmal atrial fibrillation ([place])    a. 12/2014 In setting of Inf MI->convertred spont.   Pulmonary embolism ([place])    a. 12/2014 R PT and peroneal vein DVT and RUL PE-->Xarelto x 3 mos.   Past Surgical History:  Procedure Laterality Date   CARDIAC CATHETERIZATION N/A 12/07/2014   Procedure: Coronary Stent Intervention;  Surgeon: Peter M Martinique, MD;  Location: [place] [facility];  Service: Cardiovascular;  Laterality: N/A;  STEMI   CARDIAC CATHETERIZATION N/A 12/07/2014   Procedure: IABP Insertion;  Surgeon: Peter M Martinique, MD;  Location: [place] [facility];  Service: Cardiovascular;  Laterality: N/A;   CARDIAC CATHETERIZATION N/A 12/07/2014   Procedure: Temporary Pacemaker;  Surgeon: Peter M Martinique, MD;  Location: [place] [facility];  Service: Cardiovascular;  Laterality: N/A;   CARDIAC CATHETERIZATION N/A 12/07/2014   Procedure: Left Heart Cath and Coronary Angiography;  Surgeon: Peter M Martinique, MD;  Location: [place] [facility];  Service: Cardiovascular;  Laterality: N/A;   CARDIAC CATHETERIZATION N/A 03/29/2015   Procedure: Right/Left Heart Cath and Coronary Angiography;  Surgeon: Peter M Martinique, MD;  Location: [place] [facility];  Service: Cardiovascular;  Laterality: N/A;   CORONARY ARTERY BYPASS GRAFT N/A 04/06/2015    Procedure: CORONARY ARTERY BYPASS GRAFT TIMES FOUR  USING LEFT INTERNAL MAMMARY ARTERY TO THE LEFT ANTERIOR DESCENDING, BILATERAL GREATER SAPHENOUS ENDOVEIN HARVEST GRAFT TO PROXIMAL AND MID POSTERIOR DESCENDING AND DIAGONAL CORONARY ARTERIES AND PLACEMENT OR RIGHT FEMORAL A-LINE. ;  Surgeon: [name], MD;  Location: [place];  Service: Open Heart Surgery;  Laterality: N/A;   FRACTURE SURGERY     HERNIA REPAIR     OPEN REDUCTION INTERNAL FIXATION (ORIF) PROXIMAL PHALANX Right 06/19/2017   Procedure: OPEN REDUCTION INTERNAL FIXATION (ORIF) PROXIMAL PHALANX;  Surgeon: Shona Needles, MD;  Location: [place];  Service: Orthopedics;  Laterality: Right;   ORIF ANKLE FRACTURE Right 06/19/2017   Procedure: OPEN REDUCTION INTERNAL FIXATION (ORIF) ANKLE FRACTURE;  Surgeon: Shona Needles, MD;  Location: [place];  Service: Orthopedics;  Laterality: Right;   ORIF ELBOW FRACTURE Right 06/19/2017   Procedure: OPEN REDUCTION ELBOW DISLOCATION WITH RADIAL HEAD REPLACEMENT;  Surgeon: Shona Needles, MD;  Location: [place];  Service:  Orthopedics;  Laterality: Right;   TEE WITHOUT CARDIOVERSION N/A 04/06/2015   Procedure: TRANSESOPHAGEAL ECHOCARDIOGRAM (TEE);  Surgeon: [name], MD;  Location: [place];  Service: Open Heart Surgery;  Laterality: N/A;   TONSILLECTOMY     UMBILICAL HERNIA REPAIR       Current Meds  Medication Sig   aspirin EC 81 MG tablet Take 81 mg by mouth daily.   atorvastatin (LIPITOR) 80 MG tablet TAKE 1 TABLET BY MOUTH DAILY AT 6 PM.   Cholecalciferol (VITAMIN D3) 5000 UNITS TABS Take 5,000 Units by mouth daily.    famotidine (PEPCID) 20 MG tablet Take 20 mg by mouth 2 (two) times daily. Pt reports he takes prn depending on dietary intake   furosemide (LASIX) 40 MG tablet TAKE 1 TABLET BY MOUTH EVERY DAY   KLOR-CON M20 20 MEQ tablet TAKE 2 TABLETS BY MOUTH EVERY DAY (Patient taking differently: Take 20 mEq by mouth daily. )   levothyroxine (SYNTHROID) 50 MCG tablet TAKE 1  TABLET BY MOUTH TWICE A DAY   losartan (COZAAR) 25 MG tablet TAKE 1 TABLET BY MOUTH EVERY DAY   Multiple Vitamin (MULTIVITAMIN WITH MINERALS) TABS tablet Take 1 tablet by mouth daily.   PROAIR HFA 108 (90 Base) MCG/ACT inhaler Inhale 2 puffs into the lungs every 6 (six) hours as needed for wheezing.      Allergies:   Patient has no known allergies.   Social History   Tobacco Use   Smoking status: Never Smoker   Smokeless tobacco: Never Used  Substance Use Topics   Alcohol use: Not Currently    Alcohol/week: 0.0 standard drinks   Drug use: Never     Family Hx: The patient's family history includes Heart attack in his father.  ROS:   Please see the history of present illness.     All other systems reviewed and are negative.   Prior CV studies:   The following studies were reviewed today:  Echo 07/18/15: Study Conclusions - Left ventricle: The cavity size was mildly dilated. Systolic   function was moderately reduced. The estimated ejection fraction   was in the range of 35% to 40%. Diffuse hypokinesis. There is   akinesis of the entireinferolateral and inferior myocardium.   Features are consistent with a pseudonormal left ventricular   filling pattern, with concomitant abnormal relaxation and   increased filling pressure (grade 2 diastolic dysfunction).   Doppler parameters are consistent with high ventricular filling   pressure. - Aortic valve: Moderate diffuse thickening and calcification.   There was trivial regurgitation. - Mitral valve: There was mild regurgitation. - Left atrium: The atrium was mildly dilated. - Right atrium: The atrium was mildly dilated. - Tricuspid valve: There was trivial regurgitation. - Pulmonary arteries: PA peak pressure: 31 mm Hg (S).  Labs/Other Tests and Data Reviewed:    EKG:  An ECG dated 09/30/17 was personally reviewed today and demonstrated:  sinus rhythm with PVCs, incomplete RBBB, inferior infarct, HR 73  Recent  Labs: 06/18/2018: ALT 23   Recent Lipid Panel Lab Results  Component Value Date/Time   CHOL 109 06/18/2018 08:52 AM   TRIG 46 06/18/2018 08:52 AM   HDL 48 06/18/2018 08:52 AM   CHOLHDL 2.3 06/18/2018 08:52 AM   CHOLHDL 2.3 10/10/2015 02:28 PM   LDLCALC 52 06/18/2018 08:52 AM    Wt Readings from Last 3 Encounters:  06/27/18 205 lb (93 kg)  09/30/17 195 lb (88.5 kg)  06/19/17 199 lb 15.3 oz (90.7  kg)     Objective:    Vital Signs:  BP 104/66    Pulse 69    Ht 5\' 7"  (1.702 m)    Wt 205 lb (93 kg)    BMI 32.11 kg/m    VITAL SIGNS:  reviewed GEN:  no acute distress EYES:  sclerae anicteric, EOMI - Extraocular Movements Intact RESPIRATORY:  normal respiratory effort, symmetric expansion CARDIOVASCULAR:  no peripheral edema SKIN:  no rash, lesions or ulcers. MUSCULOSKELETAL:  no obvious deformities. NEURO:  alert and oriented x 3, no obvious focal deficit PSYCH:  normal affect  ASSESSMENT & PLAN:     CAD s/p CABG x4  No BB for hx of bradycardia ASA statin. No chest pain. No bleeding   Hyperlipidemia 06/18/2018: Cholesterol, Total 109; HDL 48; LDL Calculated 52; Triglycerides 46 Continue statin   Hypertension Borderline low, losartan 25 mg   Chronic systolic hear failure Lasix 40 mg daily - he may try to decrease to 20 mg. He was given strict instructions on how to do this and when to call our office. I generally recommended against decreasing lasix at this time as I do not want him to have a hospitalization during a pandemic. He understands the plan.    Paroxysmal atrial fibrillation In the post-op setting. No recurrence on heart monitor. No anticoagulation recommended unless there is a recurrence.    Weight gain due to excessive calories.  Pandemic snacks. He will resume daily weights and try to cut snacking.    COVID-19 Education: The signs and symptoms of COVID-19 were discussed with the patient and how to seek care for testing (follow up with PCP or arrange  E-visit).  The importance of social distancing was discussed today.  Time:   Today, I have spent 21 minutes with the patient with telehealth technology discussing the above problems.     Medication Adjustments/Labs and Tests Ordered: Current medicines are reviewed at length with the patient today.  Concerns regarding medicines are outlined above.   Tests Ordered: No orders of the defined types were placed in this encounter.   Medication Changes: No orders of the defined types were placed in this encounter.   Follow Up:  Virtual Visit or In Person in 1 year(s)  Signed, Ledora Bottcher, PA  06/27/2018 2:34 PM    Agenda

## 2018-06-26 NOTE — Telephone Encounter (Signed)
I called pt to confirm 06-27-18 appt with Fabian Sharp.

## 2018-06-27 ENCOUNTER — Encounter: Payer: Self-pay | Admitting: Physician Assistant

## 2018-06-27 ENCOUNTER — Telehealth (INDEPENDENT_AMBULATORY_CARE_PROVIDER_SITE_OTHER): Payer: Medicare Other | Admitting: Physician Assistant

## 2018-06-27 VITALS — BP 104/66 | HR 69 | Ht 67.0 in | Wt 205.0 lb

## 2018-06-27 DIAGNOSIS — I10 Essential (primary) hypertension: Secondary | ICD-10-CM

## 2018-06-27 DIAGNOSIS — I2510 Atherosclerotic heart disease of native coronary artery without angina pectoris: Secondary | ICD-10-CM | POA: Diagnosis not present

## 2018-06-27 DIAGNOSIS — Z951 Presence of aortocoronary bypass graft: Secondary | ICD-10-CM

## 2018-06-27 DIAGNOSIS — I480 Paroxysmal atrial fibrillation: Secondary | ICD-10-CM

## 2018-06-27 DIAGNOSIS — Z9861 Coronary angioplasty status: Secondary | ICD-10-CM

## 2018-06-27 DIAGNOSIS — I5022 Chronic systolic (congestive) heart failure: Secondary | ICD-10-CM

## 2018-06-27 DIAGNOSIS — E7800 Pure hypercholesterolemia, unspecified: Secondary | ICD-10-CM

## 2018-06-27 NOTE — Telephone Encounter (Signed)
F/U Message            Patient is calling in today to get the link for his Virtual appointment today at 2:00 pls call to advise. [phone]

## 2018-06-27 NOTE — Patient Instructions (Signed)
Medication Instructions:  Doreene Adas, PA recommends that you continue on your current medications as directed. Please refer to the Current Medication list given to you today.  If you need a refill on your cardiac medications before your next appointment, please call your pharmacy.   Follow-Up: At [facility], you and your health needs are our priority.  As part of our continuing mission to provide you with exceptional heart care, we have created designated Provider Care Teams.  These Care Teams include your primary Cardiologist (physician) and Advanced Practice Providers (APPs -  Physician Assistants and Nurse Practitioners) who all work together to provide you with the care you need, when you need it. You will need a follow up appointment in 12 months.  Please call our office 2 months in advance to schedule this appointment.  You may see Peter Martinique, MD or one of the following Advanced Practice Providers on your designated Care Team: [place], [place] . Fabian Sharp, PA-C . You will receive a reminder letter in the mail two months in advance. If you don't receive a letter, please call our office to schedule the follow-up appointment.

## 2018-07-13 ENCOUNTER — Other Ambulatory Visit: Payer: Self-pay | Admitting: Cardiology

## 2018-07-19 ENCOUNTER — Other Ambulatory Visit: Payer: Self-pay | Admitting: Cardiology

## 2018-07-24 ENCOUNTER — Other Ambulatory Visit: Payer: Self-pay | Admitting: Cardiology

## 2018-08-26 DIAGNOSIS — Z23 Encounter for immunization: Secondary | ICD-10-CM | POA: Diagnosis not present

## 2018-09-05 ENCOUNTER — Other Ambulatory Visit: Payer: Self-pay | Admitting: Cardiology

## 2018-09-05 NOTE — Telephone Encounter (Signed)
Please advise of OK to refill. Thank you!

## 2018-09-15 ENCOUNTER — Other Ambulatory Visit: Payer: Self-pay

## 2018-09-15 MED ORDER — POTASSIUM CHLORIDE CRYS ER 20 MEQ PO TBCR: 40.0000 meq | EXTENDED_RELEASE_TABLET | Freq: Every day | ORAL | 3 refills | Status: DC

## 2018-10-06 DIAGNOSIS — L819 Disorder of pigmentation, unspecified: Secondary | ICD-10-CM | POA: Diagnosis not present

## 2018-10-06 DIAGNOSIS — D1801 Hemangioma of skin and subcutaneous tissue: Secondary | ICD-10-CM | POA: Diagnosis not present

## 2018-10-06 DIAGNOSIS — D225 Melanocytic nevi of trunk: Secondary | ICD-10-CM | POA: Diagnosis not present

## 2018-10-06 DIAGNOSIS — Z85828 Personal history of other malignant neoplasm of skin: Secondary | ICD-10-CM | POA: Diagnosis not present

## 2018-10-06 DIAGNOSIS — L570 Actinic keratosis: Secondary | ICD-10-CM | POA: Diagnosis not present

## 2018-10-06 DIAGNOSIS — L821 Other seborrheic keratosis: Secondary | ICD-10-CM | POA: Diagnosis not present

## 2018-10-06 DIAGNOSIS — L578 Other skin changes due to chronic exposure to nonionizing radiation: Secondary | ICD-10-CM | POA: Diagnosis not present

## 2018-10-11 ENCOUNTER — Other Ambulatory Visit: Payer: Self-pay | Admitting: Cardiology

## 2018-10-11 ENCOUNTER — Other Ambulatory Visit: Payer: Self-pay | Admitting: Cardiovascular Disease

## 2019-01-18 ENCOUNTER — Other Ambulatory Visit: Payer: Self-pay | Admitting: Cardiology

## 2019-01-20 NOTE — Telephone Encounter (Signed)
Rx(s) sent to pharmacy electronically.  

## 2019-02-05 DIAGNOSIS — L570 Actinic keratosis: Secondary | ICD-10-CM | POA: Diagnosis not present

## 2019-03-05 DIAGNOSIS — L570 Actinic keratosis: Secondary | ICD-10-CM | POA: Diagnosis not present

## 2019-07-03 ENCOUNTER — Telehealth: Payer: Self-pay | Admitting: Cardiology

## 2019-07-03 NOTE — Telephone Encounter (Signed)
I attempted to contact patient 07/03/19 to schedule follow up visit from patients recall list. The patient didn't answer, left message for patient to return call.

## 2019-07-09 ENCOUNTER — Other Ambulatory Visit: Payer: Self-pay | Admitting: Cardiology

## 2019-07-10 ENCOUNTER — Other Ambulatory Visit: Payer: Self-pay

## 2019-07-10 MED ORDER — ATORVASTATIN CALCIUM 80 MG PO TABS: 80.0000 mg | ORAL_TABLET | Freq: Every day | ORAL | 0 refills | Status: DC

## 2019-08-07 DIAGNOSIS — H2513 Age-related nuclear cataract, bilateral: Secondary | ICD-10-CM | POA: Diagnosis not present

## 2019-08-07 DIAGNOSIS — H40033 Anatomical narrow angle, bilateral: Secondary | ICD-10-CM | POA: Diagnosis not present

## 2019-09-04 ENCOUNTER — Ambulatory Visit: Payer: Medicare Other | Admitting: Cardiology

## 2019-09-10 DIAGNOSIS — Z23 Encounter for immunization: Secondary | ICD-10-CM | POA: Diagnosis not present

## 2019-09-25 ENCOUNTER — Ambulatory Visit (INDEPENDENT_AMBULATORY_CARE_PROVIDER_SITE_OTHER): Payer: Medicare Other | Admitting: Physician Assistant

## 2019-09-25 ENCOUNTER — Encounter: Payer: Self-pay | Admitting: Physician Assistant

## 2019-09-25 ENCOUNTER — Other Ambulatory Visit: Payer: Self-pay

## 2019-09-25 VITALS — BP 100/52 | HR 56 | Ht 68.0 in | Wt 215.4 lb

## 2019-09-25 DIAGNOSIS — I10 Essential (primary) hypertension: Secondary | ICD-10-CM | POA: Diagnosis not present

## 2019-09-25 DIAGNOSIS — Z79899 Other long term (current) drug therapy: Secondary | ICD-10-CM | POA: Diagnosis not present

## 2019-09-25 DIAGNOSIS — I824Y9 Acute embolism and thrombosis of unspecified deep veins of unspecified proximal lower extremity: Secondary | ICD-10-CM | POA: Diagnosis not present

## 2019-09-25 DIAGNOSIS — I480 Paroxysmal atrial fibrillation: Secondary | ICD-10-CM

## 2019-09-25 DIAGNOSIS — I5022 Chronic systolic (congestive) heart failure: Secondary | ICD-10-CM

## 2019-09-25 DIAGNOSIS — E785 Hyperlipidemia, unspecified: Secondary | ICD-10-CM | POA: Diagnosis not present

## 2019-09-25 DIAGNOSIS — I25810 Atherosclerosis of coronary artery bypass graft(s) without angina pectoris: Secondary | ICD-10-CM | POA: Diagnosis not present

## 2019-09-25 NOTE — Patient Instructions (Addendum)
Medication Instructions:  Your Physician recommend you continue on your current medication as directed.    *If you need a refill on your cardiac medications before your next appointment, please call your pharmacy*   Lab Work: Your physician recommends that you return for lab work (CBC, Fasting lipid, CMP).  If you have labs (blood work) drawn today and your tests are completely normal, you will receive your results only by: [name] MyChart Message (if you have MyChart) OR . A paper copy in the mail If you have any lab test that is abnormal or we need to change your treatment, we will call you to review the results.   Testing/Procedures: None ordered    Follow-Up: At [facility], you and your health needs are our priority.  As part of our continuing mission to provide you with exceptional heart care, we have created designated Provider Care Teams.  These Care Teams include your primary Cardiologist (physician) and Advanced Practice Providers (APPs -  Physician Assistants and Nurse Practitioners) who all work together to provide you with the care you need, when you need it.  We recommend signing up for the patient portal called "MyChart".  Sign up information is provided on this After Visit Summary.  MyChart is used to connect with patients for Virtual Visits (Telemedicine).  Patients are able to view lab/test results, encounter notes, upcoming appointments, etc.  Non-urgent messages can be sent to your provider as well.   To learn more about what you can do with MyChart, go to [email or web address].    Your next appointment:   1 year(s)  The format for your next appointment:   In Person  Provider:   Peter Martinique, MD

## 2019-09-25 NOTE — Progress Notes (Signed)
Cardiology Office Note:    Date:  09/27/2019   ID:  Ryan Ward, DOB 1945/05/28, MRN [id number]  PCP:  Ryan Downing, MD  [facility] Cardiologist:  Ryan Martinique, MD  [place] Electrophysiologist:  None   Referring MD: Ryan Ward, *   Chief Complaint  Patient presents with  . Follow-up    seen for Dr. Martinique    History of Present Illness:    Ryan Ward is a 74 y.o. male with a hx of CAD s/p CABG, chronic systolic HF, h/o RLE DVT/PE, HTN, HLD, OSA and PAF.  Patient had a history of inferior ST elevated MI in December 2016.  Cardiac catheterization showed occluded RCA and severe LAD and circumflex disease.  RCA was successfully treated with drug-eluting stent.  Periprocedural course complicated by cardiogenic shock, ventricular fibrillation and a complete heart block.  He also developed atrial fibrillation.  During the same admission, he developed right lower extremity DVT and the right upper lobe pulmonary emboli.  He was placed on Xarelto and completed the course of anticoagulation.  He underwent relook cardiac catheterization in March 2016 which showed patency of the RCA stent with a residual distal RPDA disease as well as proximal to mid LAD disease and also left circumflex disease.  He underwent successful CABG x4 (LIMA to LAD, SVG to diagonal, sequential SVG to proximal RPDA and the mid RPDA).  Postop course was complicated by atrial fibrillation that was treated successfully with amiodarone therapy.  After discharge, he developed heart failure and was treated with increased dose of Lasix.  Due to significant bradycardia on follow-up, metoprolol and amiodarone were discontinued.  Follow-up echo in July 2017 showed EF improved to 35 to 40%.  ICD was deferred.  Event monitor did not show any recurrence of atrial fibrillation as well.  Patient presents today for 1 year follow-up.  He denies any recent chest pain or shortness of breath.  He has no lower extremity  edema, orthopnea or PND.  He is due for repeat lab work.  He will need his CBC, fasting lipid panel and a CMP in the next few months.  Otherwise he can follow-up in 1 year.    Past Medical History:  Diagnosis Date  . Asthma    VERY MILD  . CAD (coronary artery disease)    a. 12/07/14 Inf STEMI/PCI: LAD 85p/m, 26m/d, LCX 80ost, RCA 30p/m, 71m/d, 100d (4.0x20 Promus Premier DES). Hosp course complicated by VF arrest, CGS, CHB req temp wire; b. 03/2015 Cath: LAD 85p/m, 95d, LCx 80ost, RCA 30-40p/m/d, RPDA 75, EF 30-35%, Nl CO; c. 04/06/2015 CABGx4 (LIMA->LAD, VG->Diag, Seq VG->prox RPDA->mid RPDA.  Ryan Ward Chronic systolic CHF (congestive heart failure) ([place])    a. 03/2015 Echo: EF 30-35%, diff HK.  Ryan Ward DVT (deep venous thrombosis) ([name]) 12/2014   RLE  . History of blood transfusion 04/2013   S/P CABG  . Hyperlipidemia   . Hypertension   . Hypertensive heart disease   . Ischemic cardiomyopathy    a. 03/2015 Echo: EF 30-35%, diff HK, mild MR, mod dil RA, PASP 84mmHg.  . Myocardial infarction (Ryan Ward) 12/2014  . OSA (obstructive sleep apnea)    a. not using CPAP at home, does have machine (06/20/2017)  . Paroxysmal atrial fibrillation (Ryan Ward)    a. 12/2014 In setting of Inf MI->convertred spont.  . Pulmonary embolism (Ryan Ward)    a. 12/2014 R PT and peroneal vein DVT and RUL PE-->Xarelto x 3 mos.    Past Surgical History:  Procedure Laterality Date  . CARDIAC CATHETERIZATION N/A 12/07/2014   Procedure: Coronary Stent Intervention;  Surgeon: Ryan M Martinique, MD;  Location: [place] [facility];  Service: Cardiovascular;  Laterality: N/A;  STEMI  . CARDIAC CATHETERIZATION N/A 12/07/2014   Procedure: IABP Insertion;  Surgeon: Ryan M Martinique, MD;  Location: [place] [facility];  Service: Cardiovascular;  Laterality: N/A;  . CARDIAC CATHETERIZATION N/A 12/07/2014   Procedure: Temporary Pacemaker;  Surgeon: Ryan M Martinique, MD;  Location: [place] [facility];  Service: Cardiovascular;  Laterality: N/A;  . CARDIAC  CATHETERIZATION N/A 12/07/2014   Procedure: Left Heart Cath and Coronary Angiography;  Surgeon: Ryan M Martinique, MD;  Location: [place] [facility];  Service: Cardiovascular;  Laterality: N/A;  . CARDIAC CATHETERIZATION N/A 03/29/2015   Procedure: Right/Left Heart Cath and Coronary Angiography;  Surgeon: Ryan M Martinique, MD;  Location: [place] [facility];  Service: Cardiovascular;  Laterality: N/A;  . CORONARY ARTERY BYPASS GRAFT N/A 04/06/2015   Procedure: CORONARY ARTERY BYPASS GRAFT TIMES FOUR  USING LEFT INTERNAL MAMMARY ARTERY TO THE LEFT ANTERIOR DESCENDING, BILATERAL GREATER SAPHENOUS ENDOVEIN HARVEST GRAFT TO PROXIMAL AND MID POSTERIOR DESCENDING AND DIAGONAL CORONARY ARTERIES AND PLACEMENT OR RIGHT FEMORAL A-LINE. ;  Surgeon: Ryan Isaac, MD;  Location: [place];  Service: Open Heart Surgery;  Laterality: N/A;  . FRACTURE SURGERY    . HERNIA REPAIR    . OPEN REDUCTION INTERNAL FIXATION (ORIF) PROXIMAL PHALANX Right 06/19/2017   Procedure: OPEN REDUCTION INTERNAL FIXATION (ORIF) PROXIMAL PHALANX;  Surgeon: Ryan Needles, MD;  Location: [place];  Service: Orthopedics;  Laterality: Right;  . ORIF ANKLE FRACTURE Right 06/19/2017   Procedure: OPEN REDUCTION INTERNAL FIXATION (ORIF) ANKLE FRACTURE;  Surgeon: Ryan Needles, MD;  Location: [place];  Service: Orthopedics;  Laterality: Right;  . ORIF ELBOW FRACTURE Right 06/19/2017   Procedure: OPEN REDUCTION ELBOW DISLOCATION WITH RADIAL HEAD REPLACEMENT;  Surgeon: Ryan Needles, MD;  Location: [place];  Service: Orthopedics;  Laterality: Right;  . TEE WITHOUT CARDIOVERSION N/A 04/06/2015   Procedure: TRANSESOPHAGEAL ECHOCARDIOGRAM (TEE);  Surgeon: Ryan Isaac, MD;  Location: [place];  Service: Open Heart Surgery;  Laterality: N/A;  . TONSILLECTOMY    . UMBILICAL HERNIA REPAIR      Current Medications: Current Meds  Medication Sig  . aspirin EC 81 MG tablet Take 81 mg by mouth daily.  Ryan Ward atorvastatin (LIPITOR) 80 MG tablet Take 1 tablet (80 mg  total) by mouth daily. Advised patient to schedule OV for further refills  . Cholecalciferol (VITAMIN D3) 5000 UNITS TABS Take 5,000 Units by mouth daily.   . famotidine (PEPCID) 20 MG tablet Take 20 mg by mouth daily.  . furosemide (LASIX) 40 MG tablet TAKE 1 TABLET BY MOUTH EVERY DAY  . levothyroxine (SYNTHROID) 50 MCG tablet TAKE 1 TABLET BY MOUTH TWICE A DAY  . losartan (COZAAR) 25 MG tablet TAKE 1 TABLET BY MOUTH EVERY DAY  . Multiple Vitamin (MULTIVITAMIN WITH MINERALS) TABS tablet Take 1 tablet by mouth daily.  . potassium chloride SA (KLOR-CON) 20 MEQ tablet Take 20 mEq by mouth daily.  Ryan Ward PROAIR HFA 108 (90 Base) MCG/ACT inhaler Inhale 2 puffs into the lungs every 6 (six) hours as needed for wheezing.      Allergies:   Patient has no known allergies.   Social History   Socioeconomic History  . Marital status: Significant Other    Spouse name: Not on file  . Number of children:  Not on file  . Years of education: Not on file  . Highest education level: Not on file  Occupational History  . Not on file  Tobacco Use  . Smoking status: Never Smoker  . Smokeless tobacco: Never Used  Vaping Use  . Vaping Use: Never used  Substance and Sexual Activity  . Alcohol use: Not Currently    Alcohol/week: 0.0 standard drinks  . Drug use: Never  . Sexual activity: Yes  Other Topics Concern  . Not on file  Social History Narrative   Works with [name]   Social Determinants of Health   Financial Resource Strain:   . Difficulty of Paying Living Expenses: Not on file  Food Insecurity:   . Worried About Charity fundraiser in the Last Year: Not on file  . Ran Out of Food in the Last Year: Not on file  Transportation Needs:   . Lack of Transportation (Medical): Not on file  . Lack of Transportation (Non-Medical): Not on file  Physical Activity:   . Days of Exercise per Week: Not on file  . Minutes of Exercise per Session: Not on file  Stress:   . Feeling of Stress : Not on  file  Social Connections:   . Frequency of Communication with Friends and Family: Not on file  . Frequency of Social Gatherings with Friends and Family: Not on file  . Attends Religious Services: Not on file  . Active Member of Clubs or Organizations: Not on file  . Attends Archivist Meetings: Not on file  . Marital Status: Not on file     Family History: The patient's family history includes Heart attack in his father.  ROS:   Please see the history of present illness.     All other systems reviewed and are negative.  EKGs/Labs/Other Studies Reviewed:    The following studies were reviewed today:  Echo 07/18/2015 LV EF: 35% -  40%  Study Conclusions   - Left ventricle: The cavity size was mildly dilated. Systolic  function was moderately reduced. The estimated ejection fraction  was in the range of 35% to 40%. Diffuse hypokinesis. There is  akinesis of the entireinferolateral and inferior myocardium.  Features are consistent with a pseudonormal left ventricular  filling pattern, with concomitant abnormal relaxation and  increased filling pressure (grade 2 diastolic dysfunction).  Doppler parameters are consistent with high ventricular filling  pressure.  - Aortic valve: Moderate diffuse thickening and calcification.  There was trivial regurgitation.  - Mitral valve: There was mild regurgitation.  - Left atrium: The atrium was mildly dilated.  - Right atrium: The atrium was mildly dilated.  - Tricuspid valve: There was trivial regurgitation.  - Pulmonary arteries: PA peak pressure: 31 mm Hg (S).   EKG:  EKG is ordered today.  The ekg ordered today demonstrates normal sinus rhythm, Q waves in the inferior leads, poor R wave progression in the anterior leads.  Recent Labs: No results found for requested labs within last 8760 hours.  Recent Lipid Panel    Component Value Date/Time   CHOL 109 06/18/2018 0852   TRIG 46 06/18/2018 0852   HDL  48 06/18/2018 0852   CHOLHDL 2.3 06/18/2018 0852   CHOLHDL 2.3 10/10/2015 1428   VLDL 9 10/10/2015 1428   LDLCALC 52 06/18/2018 0852    Physical Exam:    VS:  BP (!) 100/52   Pulse (!) 56   Ht 5\' 8"  (1.727 m)  Wt 215 lb 6.4 oz (97.7 kg)   SpO2 94%   BMI 32.75 kg/m     Wt Readings from Last 3 Encounters:  09/25/19 215 lb 6.4 oz (97.7 kg)  06/27/18 205 lb (93 kg)  09/30/17 195 lb (88.5 kg)     GEN:  Well nourished, well developed in no acute distress HEENT: Normal NECK: No JVD; No carotid bruits LYMPHATICS: No lymphadenopathy CARDIAC: RRR, no murmurs, rubs, gallops RESPIRATORY:  Clear to auscultation without rales, wheezing or rhonchi  ABDOMEN: Soft, non-tender, non-distended MUSCULOSKELETAL:  No edema; No deformity  SKIN: Warm and dry NEUROLOGIC:  Alert and oriented x 3 PSYCHIATRIC:  Normal affect   ASSESSMENT:    1. Coronary artery disease involving coronary bypass graft of native heart without angina pectoris   2. Medication management   3. Essential hypertension   4. Chronic systolic heart failure ([place])   5. Hyperlipidemia LDL goal <70   6. PAF (paroxysmal atrial fibrillation) ([place])   7. Deep vein thrombosis (DVT) of proximal lower extremity, unspecified chronicity, unspecified laterality (HCC)    PLAN:    In order of problems listed above:  1. CAD s/p CABG: Continue aspirin and statin.  No recent anginal symptom  2. Chronic systolic heart failure: Euvolemic on physical exam  3. Hypertension: Blood pressure stable  4. Hyperlipidemia: Continue Lipitor  5. PAF: Occurred in the postop setting.  No recent recurrence.  Unless atrial fibrillation recurs, will hold off on anticoagulation therapy.  6. History of DVT: Completed a course of anticoagulation therapy.   Medication Adjustments/Labs and Tests Ordered: Current medicines are reviewed at length with the patient today.  Concerns regarding medicines are outlined above.  Orders Placed This Encounter    Procedures  . CBC  . Lipid panel  . Comprehensive metabolic panel  . EKG 12-Lead   No orders of the defined types were placed in this encounter.   Patient Instructions  Medication Instructions:  Your Physician recommend you continue on your current medication as directed.    *If you need a refill on your cardiac medications before your next appointment, please call your pharmacy*   Lab Work: Your physician recommends that you return for lab work (CBC, Fasting lipid, CMP).  If you have labs (blood work) drawn today and your tests are completely normal, you will receive your results only by: Ryan Ward MyChart Message (if you have MyChart) OR . A paper copy in the mail If you have any lab test that is abnormal or we need to change your treatment, we will call you to review the results.   Testing/Procedures: None ordered    Follow-Up: At [facility], you and your health needs are our priority.  As part of our continuing mission to provide you with exceptional heart care, we have created designated Provider Care Teams.  These Care Teams include your primary Cardiologist (physician) and Advanced Practice Providers (APPs -  Physician Assistants and Nurse Practitioners) who all work together to provide you with the care you need, when you need it.  We recommend signing up for the patient portal called "MyChart".  Sign up information is provided on this After Visit Summary.  MyChart is used to connect with patients for Virtual Visits (Telemedicine).  Patients are able to view lab/test results, encounter notes, upcoming appointments, etc.  Non-urgent messages can be sent to your provider as well.   To learn more about what you can do with MyChart, go to [email or web address].    Your next  appointment:   1 year(s)  The format for your next appointment:   In Person  Provider:   Peter Martinique, MD       Signed, Almyra Deforest, [place]  09/27/2019 9:14 PM    [place]

## 2019-09-27 ENCOUNTER — Encounter: Payer: Self-pay | Admitting: Physician Assistant

## 2019-10-04 ENCOUNTER — Other Ambulatory Visit: Payer: Self-pay | Admitting: Cardiology

## 2019-10-07 ENCOUNTER — Other Ambulatory Visit: Payer: Self-pay | Admitting: Cardiology

## 2019-10-23 ENCOUNTER — Other Ambulatory Visit: Payer: Self-pay

## 2019-10-23 MED ORDER — ATORVASTATIN CALCIUM 80 MG PO TABS
80.0000 mg | ORAL_TABLET | Freq: Every day | ORAL | 3 refills | Status: DC
Start: 2019-10-23 — End: 2020-10-06

## 2019-10-26 ENCOUNTER — Other Ambulatory Visit: Payer: Self-pay | Admitting: Cardiology

## 2019-10-26 NOTE — Telephone Encounter (Signed)
*  STAT* If patient is at the pharmacy, call can be transferred to refill team.   1. Which medications need to be refilled? (please list name of each medication and dose if known) atorvastatin (LIPITOR) 80 MG tablet  2. Which pharmacy/location (including street and city if local pharmacy) is medication to be sent to? [id number] - [place], [place] - [address].  3. Do they need a 30 day or 90 day supply? 90 day

## 2019-10-27 NOTE — Telephone Encounter (Signed)
Order Providers  Prescribing Provider Encounter Provider  [facility], Tami Lin, PA Zebedee Iba, CMA  Outpatient Medication Detail   Disp Refills Start End   atorvastatin (LIPITOR) 80 MG tablet 90 tablet 3 10/23/2019    Sig - Route: Take 1 tablet (80 mg total) by mouth daily. Advised patient to schedule OV for further refills - Oral   Sent to pharmacy as: atorvastatin (LIPITOR) 80 MG tablet   Notes to Pharmacy: PLEASE SCHEDULE AN APPT FOR FUTURE REFILLS   E-Prescribing Status: Receipt confirmed by pharmacy (10/23/2019 2:05 PM EDT)   Pharmacy  [id number] - [place], [place].

## 2019-10-30 DIAGNOSIS — Z23 Encounter for immunization: Secondary | ICD-10-CM | POA: Diagnosis not present

## 2020-02-11 LAB — CBC
Hematocrit: 44.4 % (ref 37.5–51.0)
Hemoglobin: 14.6 g/dL (ref 13.0–17.7)
MCH: 29.9 pg (ref 26.6–33.0)
MCHC: 32.9 g/dL (ref 31.5–35.7)
MCV: 91 fL (ref 79–97)
Platelets: 226 10*3/uL (ref 150–450)
RBC: 4.89 x10E6/uL (ref 4.14–5.80)
RDW: 12.7 % (ref 11.6–15.4)
WBC: 7.9 10*3/uL (ref 3.4–10.8)

## 2020-02-11 LAB — LIPID PANEL
Chol/HDL Ratio: 2.7 ratio (ref 0.0–5.0)
Cholesterol, Total: 130 mg/dL (ref 100–199)
HDL: 48 mg/dL (ref >39)
LDL Chol Calc (NIH): 71 mg/dL (ref 0–99)
Triglycerides: 49 mg/dL (ref 0–149)
VLDL Cholesterol Cal: 11 mg/dL (ref 5–40)

## 2020-02-11 LAB — COMPREHENSIVE METABOLIC PANEL
ALT: 19 IU/L (ref 0–44)
AST: 22 IU/L (ref 0–40)
Albumin/Globulin Ratio: 1.6 (ref 1.2–2.2)
Albumin: 4.1 g/dL (ref 3.7–4.7)
Alkaline Phosphatase: 98 IU/L (ref 44–121)
BUN/Creatinine Ratio: 16 (ref 10–24)
BUN: 19 mg/dL (ref 8–27)
Bilirubin Total: 0.9 mg/dL (ref 0.0–1.2)
CO2: 24 mmol/L (ref 20–29)
Calcium: 9.4 mg/dL (ref 8.6–10.2)
Chloride: 103 mmol/L (ref 96–106)
Creatinine, Ser: 1.2 mg/dL (ref 0.76–1.27)
GFR calc Af Amer: 68 mL/min/{1.73_m2} (ref >59)
GFR calc non Af Amer: 59 mL/min/{1.73_m2} — ABNORMAL LOW (ref >59)
Globulin, Total: 2.5 g/dL (ref 1.5–4.5)
Glucose: 101 mg/dL — ABNORMAL HIGH (ref 65–99)
Potassium: 4.8 mmol/L (ref 3.5–5.2)
Sodium: 142 mmol/L (ref 134–144)
Total Protein: 6.6 g/dL (ref 6.0–8.5)

## 2020-02-18 ENCOUNTER — Other Ambulatory Visit: Payer: Self-pay | Admitting: Family Medicine

## 2020-02-18 ENCOUNTER — Ambulatory Visit
Admission: RE | Admit: 2020-02-18 | Discharge: 2020-02-18 | Disposition: A | Discharge status: Home / Self Care | Payer: Medicare Other | Source: Ambulatory Visit | Attending: Family Medicine | Admitting: Family Medicine

## 2020-02-18 DIAGNOSIS — T1490XA Injury, unspecified, initial encounter: Secondary | ICD-10-CM

## 2020-02-24 ENCOUNTER — Encounter (HOSPITAL_COMMUNITY): Payer: Self-pay | Admitting: Orthopedic Surgery

## 2020-02-24 ENCOUNTER — Other Ambulatory Visit (HOSPITAL_COMMUNITY): Payer: Self-pay | Admitting: Orthopedic Surgery

## 2020-02-24 ENCOUNTER — Other Ambulatory Visit: Payer: Self-pay

## 2020-02-24 NOTE — Progress Notes (Signed)
Patients chart and cardiac history reviewed with Drs Sabra Heck and [place]. They agree that patient will be better served being done at [place]. Claiborne Billings at [facility] office made aware.

## 2020-02-24 NOTE — Progress Notes (Signed)
PCP Arelia Sneddon, MD Cardiologist - Martinique, MD  Chest x-ray -  EKG - 09/25/19 Stress Test - denies ECHO - 07/18/15 Cardiac Cath - 03/29/15   Aspirin Instructions: none DOS   ERAS Protcol - 06:30 clear liquids  COVID TEST- DOS  Anesthesia review: yes  -------------  SDW INSTRUCTIONS:  Your procedure is scheduled on 02/25/20. Please report to [facility] Main Entrance "A" at 06:30 A.M., and check in at the Admitting office. Call this number if you have problems the morning of surgery: [phone]   Remember: Do not eat after midnight the night before your surgery  You may drink clear liquids until 06:30 A.M. the morning of your surgery.   Clear liquids allowed are: Water, Non-Citrus Juices (without pulp), Carbonated Beverages, Clear Tea, Black Coffee Only, and Gatorade   Medications to take morning of surgery with a sip of water include: atorvastatin (LIPITOR)  doxycycline (VIBRA-TABS) famotidine (PEPCID)  levothyroxine (SYNTHROID)  PROAIR HFA --- Please bring all inhalers with you the day of surgery.    As of today, STOP taking any Aspirin (unless otherwise instructed by your surgeon), Aleve, Naproxen, Ibuprofen, Motrin, Advil, Goody's, BC's, all herbal medications, fish oil, and all vitamins.    The Morning of Surgery Do not wear jewelry Do not wear lotions, powders, colognes, or deodorant Men may shave face and neck. Do not bring valuables to the hospital. [facility] is not responsible for any belongings or valuables. If you are a smoker, DO NOT Smoke 24 hours prior to surgery If you wear a CPAP at night please bring your mask the morning of surgery  Remember that you must have someone to transport you home after your surgery, and remain with you for 24 hours if you are discharged the same day. Please bring cases for contacts, glasses, hearing aids, dentures or bridgework because it cannot be worn into surgery.   Patients discharged the day of surgery will not be allowed  to drive home.   Please shower the NIGHT BEFORE SURGERY and the MORNING OF SURGERY with DIAL Soap. Wear comfortable clothes the morning of surgery. Oral Hygiene is also important to reduce your risk of infection.  Remember - BRUSH YOUR TEETH THE MORNING OF SURGERY WITH YOUR REGULAR TOOTHPASTE  Patient denies shortness of breath, fever, cough and chest pain.

## 2020-02-25 ENCOUNTER — Other Ambulatory Visit: Payer: Self-pay

## 2020-02-25 ENCOUNTER — Encounter (HOSPITAL_COMMUNITY): Payer: Self-pay | Admitting: Orthopedic Surgery

## 2020-02-25 ENCOUNTER — Encounter (HOSPITAL_COMMUNITY)
Admission: RE | Disposition: A | Discharge status: Home / Self Care | Payer: Self-pay | Source: Home / Self Care | Attending: Orthopedic Surgery

## 2020-02-25 ENCOUNTER — Ambulatory Visit (HOSPITAL_COMMUNITY): Payer: Medicare Other | Admitting: Certified Registered"

## 2020-02-25 ENCOUNTER — Ambulatory Visit (HOSPITAL_COMMUNITY)
Admission: RE | Admit: 2020-02-25 | Discharge: 2020-02-25 | Disposition: A | Discharge status: Home / Self Care | Payer: Medicare Other | Attending: Orthopedic Surgery | Admitting: Orthopedic Surgery

## 2020-02-25 DIAGNOSIS — Z86718 Personal history of other venous thrombosis and embolism: Secondary | ICD-10-CM | POA: Diagnosis not present

## 2020-02-25 DIAGNOSIS — Z885 Allergy status to narcotic agent status: Secondary | ICD-10-CM | POA: Diagnosis not present

## 2020-02-25 DIAGNOSIS — M869 Osteomyelitis, unspecified: Secondary | ICD-10-CM

## 2020-02-25 DIAGNOSIS — L97519 Non-pressure chronic ulcer of other part of right foot with unspecified severity: Secondary | ICD-10-CM | POA: Insufficient documentation

## 2020-02-25 DIAGNOSIS — L03031 Cellulitis of right toe: Secondary | ICD-10-CM | POA: Diagnosis not present

## 2020-02-25 DIAGNOSIS — Z79899 Other long term (current) drug therapy: Secondary | ICD-10-CM | POA: Insufficient documentation

## 2020-02-25 DIAGNOSIS — Z20822 Contact with and (suspected) exposure to covid-19: Secondary | ICD-10-CM | POA: Diagnosis not present

## 2020-02-25 DIAGNOSIS — Z955 Presence of coronary angioplasty implant and graft: Secondary | ICD-10-CM | POA: Diagnosis not present

## 2020-02-25 DIAGNOSIS — Z951 Presence of aortocoronary bypass graft: Secondary | ICD-10-CM | POA: Insufficient documentation

## 2020-02-25 DIAGNOSIS — I252 Old myocardial infarction: Secondary | ICD-10-CM | POA: Insufficient documentation

## 2020-02-25 DIAGNOSIS — Z7982 Long term (current) use of aspirin: Secondary | ICD-10-CM | POA: Diagnosis not present

## 2020-02-25 DIAGNOSIS — I255 Ischemic cardiomyopathy: Secondary | ICD-10-CM | POA: Diagnosis not present

## 2020-02-25 DIAGNOSIS — I5022 Chronic systolic (congestive) heart failure: Secondary | ICD-10-CM | POA: Diagnosis not present

## 2020-02-25 DIAGNOSIS — Z7951 Long term (current) use of inhaled steroids: Secondary | ICD-10-CM | POA: Diagnosis not present

## 2020-02-25 DIAGNOSIS — I110 Hypertensive heart disease with heart failure: Secondary | ICD-10-CM | POA: Diagnosis not present

## 2020-02-25 DIAGNOSIS — Z8249 Family history of ischemic heart disease and other diseases of the circulatory system: Secondary | ICD-10-CM | POA: Insufficient documentation

## 2020-02-25 DIAGNOSIS — Z86711 Personal history of pulmonary embolism: Secondary | ICD-10-CM | POA: Diagnosis not present

## 2020-02-25 HISTORY — PX: I & D EXTREMITY: SHX5045

## 2020-02-25 LAB — SARS CORONAVIRUS 2 BY RT PCR (HOSPITAL ORDER, PERFORMED IN ~~LOC~~ HOSPITAL LAB): SARS Coronavirus 2: NEGATIVE

## 2020-02-25 SURGERY — IRRIGATION AND DEBRIDEMENT EXTREMITY
Anesthesia: Monitor Anesthesia Care | Laterality: Right

## 2020-02-25 MED ORDER — LIDOCAINE 2% (20 MG/ML) 5 ML SYRINGE
INTRAMUSCULAR | Status: DC | PRN
  Administered 2020-02-25: 60 mg via INTRAVENOUS

## 2020-02-25 MED ORDER — BUPIVACAINE-EPINEPHRINE 0.5% -1:200000 IJ SOLN
INTRAMUSCULAR | Status: DC | PRN
Start: 2020-02-25 — End: 2020-02-25
  Administered 2020-02-25: 20 mL

## 2020-02-25 MED ORDER — ONDANSETRON HCL 4 MG PO TABS: 4.0000 mg | ORAL_TABLET | Freq: Every day | ORAL | 0 refills | Status: AC | PRN

## 2020-02-25 MED ORDER — SENNA 8.6 MG PO TABS: 2.0000 | ORAL_TABLET | Freq: Two times a day (BID) | ORAL | 0 refills | Status: DC

## 2020-02-25 MED ORDER — VANCOMYCIN HCL 500 MG IV SOLR
INTRAVENOUS | Status: DC | PRN
  Administered 2020-02-25: 500 mg via TOPICAL

## 2020-02-25 MED ORDER — CHLORHEXIDINE GLUCONATE 0.12 % MT SOLN
OROMUCOSAL | Status: AC
  Administered 2020-02-25: 15 mL via OROMUCOSAL
  Filled 2020-02-25: qty 15

## 2020-02-25 MED ORDER — CEFAZOLIN SODIUM-DEXTROSE 2-4 GM/100ML-% IV SOLN
2.0000 g | INTRAVENOUS | Status: AC
  Administered 2020-02-25: 2 g via INTRAVENOUS
  Filled 2020-02-25: qty 100

## 2020-02-25 MED ORDER — FENTANYL CITRATE (PF) 250 MCG/5ML IJ SOLN
INTRAMUSCULAR | Status: DC | PRN
  Administered 2020-02-25 (×2): 50 ug via INTRAVENOUS

## 2020-02-25 MED ORDER — EPINEPHRINE PF 1 MG/ML IJ SOLN
INTRAMUSCULAR | Status: AC
  Filled 2020-02-25: qty 1

## 2020-02-25 MED ORDER — SODIUM CHLORIDE 0.9 % IV SOLN: INTRAVENOUS | Status: DC

## 2020-02-25 MED ORDER — DOCUSATE SODIUM 100 MG PO CAPS: 100.0000 mg | ORAL_CAPSULE | Freq: Two times a day (BID) | ORAL | 0 refills | Status: DC

## 2020-02-25 MED ORDER — PROPOFOL 500 MG/50ML IV EMUL
INTRAVENOUS | Status: DC | PRN
  Administered 2020-02-25: 75 ug/kg/min via INTRAVENOUS

## 2020-02-25 MED ORDER — DOXYCYCLINE MONOHYDRATE 100 MG PO TABS: 100.0000 mg | ORAL_TABLET | Freq: Two times a day (BID) | ORAL | 0 refills | Status: AC

## 2020-02-25 MED ORDER — TRAMADOL HCL 50 MG PO TABS: 50.0000 mg | ORAL_TABLET | Freq: Four times a day (QID) | ORAL | 0 refills | Status: AC | PRN

## 2020-02-25 MED ORDER — PROPOFOL 10 MG/ML IV BOLUS
INTRAVENOUS | Status: DC | PRN
  Administered 2020-02-25: 25 mg via INTRAVENOUS

## 2020-02-25 MED ORDER — LACTATED RINGERS IV SOLN: INTRAVENOUS | Status: DC

## 2020-02-25 MED ORDER — PROPOFOL 10 MG/ML IV BOLUS
INTRAVENOUS | Status: AC
  Filled 2020-02-25: qty 20

## 2020-02-25 MED ORDER — FENTANYL CITRATE (PF) 250 MCG/5ML IJ SOLN
INTRAMUSCULAR | Status: AC
  Filled 2020-02-25: qty 5

## 2020-02-25 MED ORDER — BUPIVACAINE HCL (PF) 0.5 % IJ SOLN
INTRAMUSCULAR | Status: AC
  Filled 2020-02-25: qty 30

## 2020-02-25 MED ORDER — LIDOCAINE 2% (20 MG/ML) 5 ML SYRINGE
INTRAMUSCULAR | Status: AC
  Filled 2020-02-25: qty 5

## 2020-02-25 MED ORDER — VANCOMYCIN HCL 500 MG IV SOLR
INTRAVENOUS | Status: AC
  Filled 2020-02-25: qty 500

## 2020-02-25 MED ORDER — CHLORHEXIDINE GLUCONATE 0.12 % MT SOLN: 15.0000 mL | Freq: Once | OROMUCOSAL | Status: AC

## 2020-02-25 MED ORDER — ORAL CARE MOUTH RINSE: 15.0000 mL | Freq: Once | OROMUCOSAL | Status: AC

## 2020-02-25 MED ORDER — SODIUM CHLORIDE 0.9 % IR SOLN
Status: DC | PRN
  Administered 2020-02-25: 1000 mL

## 2020-02-25 SURGICAL SUPPLY — 45 items
BANDAGE ESMARK 6X9 LF (GAUZE/BANDAGES/DRESSINGS) ×1 IMPLANT
BLADE LONG MED 31X9 (MISCELLANEOUS) ×2 IMPLANT
BLADE SURG 10 STRL SS (BLADE) ×2 IMPLANT
BNDG COHESIVE 4X5 TAN STRL (GAUZE/BANDAGES/DRESSINGS) ×2 IMPLANT
BNDG COHESIVE 6X5 TAN STRL LF (GAUZE/BANDAGES/DRESSINGS) ×2 IMPLANT
BNDG CONFORM 3 STRL LF (GAUZE/BANDAGES/DRESSINGS) ×2 IMPLANT
BNDG ESMARK 6X9 LF (GAUZE/BANDAGES/DRESSINGS) ×2
CANISTER SUCT 3000ML PPV (MISCELLANEOUS) ×2 IMPLANT
CHLORAPREP W/TINT 26 (MISCELLANEOUS) ×2 IMPLANT
COVER SURGICAL LIGHT HANDLE (MISCELLANEOUS) ×2 IMPLANT
COVER WAND RF STERILE (DRAPES) ×2 IMPLANT
CUFF TOURN SGL QUICK 34 (TOURNIQUET CUFF) ×2
CUFF TOURN SGL QUICK 42 (TOURNIQUET CUFF) IMPLANT
CUFF TRNQT CYL 34X4.125X (TOURNIQUET CUFF) ×1 IMPLANT
DRSG MEPITEL 4X7.2 (GAUZE/BANDAGES/DRESSINGS) ×2 IMPLANT
DRSG PAD ABDOMINAL 8X10 ST (GAUZE/BANDAGES/DRESSINGS) IMPLANT
ELECT REM PT RETURN 9FT ADLT (ELECTROSURGICAL) ×2
ELECTRODE REM PT RTRN 9FT ADLT (ELECTROSURGICAL) ×1 IMPLANT
EVACUATOR SILICONE 100CC (DRAIN) IMPLANT
GAUZE SPONGE 4X4 12PLY STRL (GAUZE/BANDAGES/DRESSINGS) ×2 IMPLANT
GLOVE BIO SURGEON STRL SZ8 (GLOVE) ×2 IMPLANT
GLOVE ECLIPSE 8.0 STRL XLNG CF (GLOVE) ×2 IMPLANT
GLOVE SRG 8 PF TXTR STRL LF DI (GLOVE) ×2 IMPLANT
GLOVE SURG UNDER POLY LF SZ8 (GLOVE) ×4
GOWN STRL REUS W/ TWL XL LVL3 (GOWN DISPOSABLE) ×2 IMPLANT
GOWN STRL REUS W/TWL XL LVL3 (GOWN DISPOSABLE) ×4
KIT BASIN OR (CUSTOM PROCEDURE TRAY) ×2 IMPLANT
KIT TURNOVER KIT B (KITS) ×2 IMPLANT
NS IRRIG 1000ML POUR BTL (IV SOLUTION) ×2 IMPLANT
PACK ORTHO EXTREMITY (CUSTOM PROCEDURE TRAY) ×2 IMPLANT
PAD ARMBOARD 7.5X6 YLW CONV (MISCELLANEOUS) ×4 IMPLANT
PAD CAST 4YDX4 CTTN HI CHSV (CAST SUPPLIES) ×1 IMPLANT
PADDING CAST COTTON 4X4 STRL (CAST SUPPLIES) ×2
SET CYSTO W/LG BORE CLAMP LF (SET/KITS/TRAYS/PACK) ×4 IMPLANT
SPONGE LAP 4X18 RFD (DISPOSABLE) ×2 IMPLANT
SUCTION FRAZIER HANDLE 10FR (MISCELLANEOUS) ×2
SUCTION TUBE FRAZIER 10FR DISP (MISCELLANEOUS) ×1 IMPLANT
SUT ETHILON 2 0 FS 18 (SUTURE) IMPLANT
SUT ETHILON 3 0 PS 1 (SUTURE) IMPLANT
SUT VIC AB 2-0 CT1 27 (SUTURE)
SUT VIC AB 2-0 CT1 TAPERPNT 27 (SUTURE) IMPLANT
TOWEL GREEN STERILE (TOWEL DISPOSABLE) ×2 IMPLANT
TOWEL GREEN STERILE FF (TOWEL DISPOSABLE) ×2 IMPLANT
TUBE CONNECTING 12X1/4 (SUCTIONS) ×2 IMPLANT
YANKAUER SUCT BULB TIP NO VENT (SUCTIONS) ×2 IMPLANT

## 2020-02-25 NOTE — Anesthesia Postprocedure Evaluation (Signed)
Anesthesia Post Note  Patient: Ryan Ward  Procedure(s) Performed: Right hallux ulcer irrigation and excisional debridement amputation (Right )     Patient location during evaluation: PACU Anesthesia Type: MAC Level of consciousness: awake Pain management: pain level controlled Vital Signs Assessment: post-procedure vital signs reviewed and stable Respiratory status: spontaneous breathing, nonlabored ventilation, respiratory function stable and patient connected to nasal cannula oxygen Cardiovascular status: stable and blood pressure returned to baseline Postop Assessment: no apparent nausea or vomiting Anesthetic complications: no   No complications documented.  Last Vitals:  Vitals:   02/25/20 1045 02/25/20 1100  BP: 115/65 111/62  Pulse: (!) 52 (!) 50  Resp: 12 15  Temp:  36.6 C  SpO2: 99% 99%    Last Pain:  Vitals:   02/25/20 1100  TempSrc:   PainSc: 0-No pain                 [name]

## 2020-02-25 NOTE — Progress Notes (Signed)
Orthopedic Tech Progress Note Patient Details:  Ryan Ward 1945/12/03 [id number]  Ortho Devices Type of Ortho Device: Postop shoe/boot Ortho Device/Splint Location: RLE Ortho Device/Splint Interventions: Application,Ordered   Post Interventions Patient Tolerated: Well   [name] 02/25/2020, 11:33 AM

## 2020-02-25 NOTE — Op Note (Signed)
02/25/2020  10:19 AM  PATIENT:  Ryan Ward  75 y.o. male  PRE-OPERATIVE DIAGNOSIS:  right hallux osteomyelitis  POST-OPERATIVE DIAGNOSIS:  Same  Procedure(s): Right hallux amputation through the proximal phalanx  SURGEON:  Wylene Simmer, MD  ASSISTANT: Mechele Claude, PA-C  ANESTHESIA:   Regional, MAC  EBL:  minimal   TOURNIQUET: Approximately 15 minutes with an ankle Esmarch  COMPLICATIONS:  None apparent  DISPOSITION:  Extubated, awake and stable to recovery.  INDICATION FOR PROCEDURE: The patient is a 75 year old male who has a past medical history significant for coronary artery disease. He stepped on a toothpick at home about 3-1/2 weeks ago. He went about a week before seeing his primary care provider and was started on oral doxycycline. He presented to my clinic yesterday with a nonhealing ulcer as well as cellulitis of the right hallux. He had radiographic evidence of osteomyelitis of the distal phalanx. He presents now for irrigation and debridement of the wound versus possible amputation of the right hallux.  The risks and benefits of the alternative treatment options have been discussed in detail.  The patient wishes to proceed with surgery and specifically understands risks of bleeding, infection, nerve damage, blood clots, need for additional surgery, amputation and death.  PROCEDURE IN DETAIL:  After pre operative consent was obtained, and the correct operative site was identified, the patient was brought to the operating room and placed supine on the OR table. IV sedation was administered followed by a metatarsal block of half percent Marcaine with epinephrine. The right lower extremity was then prepped and draped in standard sterile fashion. The foot was exsanguinated and a 4 inch Esmarch tourniquet wrapped around the ankle. The hallux was then carefully examined. There was a large necrotic ulcer adjacent to the IP joint of the right hallux. This probe easily to bone and  into the inner phalangeal joint. The ulcer was excised down to the level of the bone. There was gross involvement of both the head of the proximal phalanx and the base of the distal phalanx with osteomyelitis. With adequate resection of the ulcer and necrotic bone there was insufficient viable toe remaining. The decision was made to proceed with amputation.  A fishmouth incision was then made at the base of the toe. Sharp dissection was carried down through the skin and subcutaneous tissues excising all necrotic soft tissues distally. Subperiosteal dissection was then carried to the base of the proximal phalanx. An oscillating saw was used to cut through the base of the proximal phalanx. The distal portion of the toe was passed off the field. The remaining soft tissue appeared healthy. Neurovascular bundles were cauterized. The wound was irrigated copiously. The cut surface of bone was smoothed with a rasp prior to irrigating. Vancomycin powder was sprinkled in the base of the wound. Horizontal mattress sutures of 2-0 nylon were used to close the incision. Sterile dressings were applied followed by a padded compression wrap. The tourniquet was released after application of the dressings. The patient was awakened from anesthesia and transported to the recovery room in stable condition.  FOLLOW UP PLAN: Weightbearing as tolerated in a flat postop shoe. We will extend his doxycycline prescription for 1 week. Follow-up in the office in 2 weeks for suture removal.   Mechele Claude PA-C was present and scrubbed for the duration of the operative case. His assistance was essential in positioning the patient, prepping and draping, gaining and maintaining exposure, performing the operation, closing and dressing the wounds and applying  the splint.

## 2020-02-25 NOTE — H&P (Signed)
Ryan Ward is an 75 y.o. male.   Chief Complaint: Right foot infection HPI: The patient is a 75 year old male with a past medical history significant for coronary artery disease.  He stepped on a toothpick at home about 3-1/2 weeks ago.  He has had a chronic infection since the injury.  Radiographs show osteomyelitis of his distal phalanx.  He presents now for I&D of the right hallux and possible amputation.  He is on oral doxycycline.  Past Medical History:  Diagnosis Date  . Asthma    VERY MILD  . CAD (coronary artery disease)    a. 12/07/14 Inf STEMI/PCI: LAD 85p/m, 46m/d, LCX 80ost, RCA 30p/m, 19m/d, 100d (4.0x20 Promus Premier DES). Hosp course complicated by VF arrest, CGS, CHB req temp wire; b. 03/2015 Cath: LAD 85p/m, 95d, LCx 80ost, RCA 30-40p/m/d, RPDA 75, EF 30-35%, Nl CO; c. 04/06/2015 CABGx4 (LIMA->LAD, VG->Diag, Seq VG->prox RPDA->mid RPDA.  [name] Chronic systolic CHF (congestive heart failure) ([place])    a. 03/2015 Echo: EF 30-35%, diff HK.  [name] DVT (deep venous thrombosis) ([place]) 12/2014   RLE  . History of blood transfusion 04/2013   S/P CABG  . Hyperlipidemia   . Hypertension   . Hypertensive heart disease   . Ischemic cardiomyopathy    a. 03/2015 Echo: EF 30-35%, diff HK, mild MR, mod dil RA, PASP 92mmHg.  . Myocardial infarction ([place]) 12/2014  . OSA (obstructive sleep apnea)    a. not using CPAP at home, does have machine (06/20/2017)  . Paroxysmal atrial fibrillation ([place])    a. 12/2014 In setting of Inf MI->convertred spont.  . Pulmonary embolism ([place])    a. 12/2014 R PT and peroneal vein DVT and RUL PE-->Xarelto x 3 mos.    Past Surgical History:  Procedure Laterality Date  . CARDIAC CATHETERIZATION N/A 12/07/2014   Procedure: Coronary Stent Intervention;  Surgeon: Peter M Martinique, MD;  Location: [facility];  Service: Cardiovascular;  Laterality: N/A;  STEMI  . CARDIAC CATHETERIZATION N/A 12/07/2014   Procedure: IABP Insertion;  Surgeon: Peter M Martinique, MD;   Location: [facility];  Service: Cardiovascular;  Laterality: N/A;  . CARDIAC CATHETERIZATION N/A 12/07/2014   Procedure: Temporary Pacemaker;  Surgeon: Peter M Martinique, MD;  Location: [facility];  Service: Cardiovascular;  Laterality: N/A;  . CARDIAC CATHETERIZATION N/A 12/07/2014   Procedure: Left Heart Cath and Coronary Angiography;  Surgeon: Peter M Martinique, MD;  Location: [facility];  Service: Cardiovascular;  Laterality: N/A;  . CARDIAC CATHETERIZATION N/A 03/29/2015   Procedure: Right/Left Heart Cath and Coronary Angiography;  Surgeon: Peter M Martinique, MD;  Location: [facility];  Service: Cardiovascular;  Laterality: N/A;  . CORONARY ARTERY BYPASS GRAFT N/A 04/06/2015   Procedure: CORONARY ARTERY BYPASS GRAFT TIMES FOUR  USING LEFT INTERNAL MAMMARY ARTERY TO THE LEFT ANTERIOR DESCENDING, BILATERAL GREATER SAPHENOUS ENDOVEIN HARVEST GRAFT TO PROXIMAL AND MID POSTERIOR DESCENDING AND DIAGONAL CORONARY ARTERIES AND PLACEMENT OR RIGHT FEMORAL A-LINE. ;  Surgeon: [name], MD;  Location: [facility];  Service: Open Heart Surgery;  Laterality: N/A;  . FRACTURE SURGERY    . HERNIA REPAIR    . OPEN REDUCTION INTERNAL FIXATION (ORIF) PROXIMAL PHALANX Right 06/19/2017   Procedure: OPEN REDUCTION INTERNAL FIXATION (ORIF) PROXIMAL PHALANX;  Surgeon: Shona Needles, MD;  Location: [facility];  Service: Orthopedics;  Laterality: Right;  . ORIF ANKLE FRACTURE Right 06/19/2017   Procedure: OPEN REDUCTION INTERNAL FIXATION (ORIF) ANKLE FRACTURE;  Surgeon: Shona Needles, MD;  Location: [facility];  Service: Orthopedics;  Laterality: Right;  . ORIF ELBOW FRACTURE Right 06/19/2017   Procedure: OPEN REDUCTION ELBOW DISLOCATION WITH RADIAL HEAD REPLACEMENT;  Surgeon: Shona Needles, MD;  Location: [facility];  Service: Orthopedics;  Laterality: Right;  . TEE WITHOUT CARDIOVERSION N/A 04/06/2015   Procedure: TRANSESOPHAGEAL ECHOCARDIOGRAM (TEE);  Surgeon: [name], MD;  Location: [facility];   Service: Open Heart Surgery;  Laterality: N/A;  . TONSILLECTOMY    . UMBILICAL HERNIA REPAIR      Family History  Problem Relation Age of Onset  . Heart attack Father    Social History:  reports that he has never smoked. He has never used smokeless tobacco. He reports previous alcohol use. He reports that he does not use drugs.  Allergies:  Allergies  Allergen Reactions  . Oxycodone Nausea And Vomiting    Medications Prior to Admission  Medication Sig Dispense Refill  . aspirin EC 81 MG tablet Take 81 mg by mouth daily.    [name] atorvastatin (LIPITOR) 80 MG tablet Take 1 tablet (80 mg total) by mouth daily. Advised patient to schedule OV for further refills (Patient taking differently: Take 80 mg by mouth daily.) 90 tablet 3  . Cholecalciferol (VITAMIN D3) 5000 UNITS TABS Take 5,000 Units by mouth daily.     [name] doxycycline (VIBRA-TABS) 100 MG tablet Take 100 mg by mouth 2 (two) times daily.    . furosemide (LASIX) 40 MG tablet TAKE 1 TABLET BY MOUTH EVERY DAY (Patient taking differently: Take 40 mg by mouth daily.) 90 tablet 2  . KLOR-CON M20 20 MEQ tablet TAKE 2 TABLETS BY MOUTH DAILY (Patient taking differently: Take 20 mEq by mouth 2 (two) times daily.) 180 tablet 3  . levothyroxine (SYNTHROID) 50 MCG tablet TAKE 1 TABLET BY MOUTH TWICE A DAY (Patient taking differently: Take 50 mcg by mouth 2 (two) times daily.) 180 tablet 3  . losartan (COZAAR) 25 MG tablet TAKE 1 TABLET BY MOUTH EVERY DAY (Patient taking differently: Take 25 mg by mouth daily.) 90 tablet 3  . Multiple Vitamin (MULTIVITAMIN WITH MINERALS) TABS tablet Take 1 tablet by mouth daily.    . famotidine (PEPCID) 20 MG tablet Take 20 mg by mouth daily as needed for heartburn or indigestion.    [name] PROAIR HFA 108 (90 Base) MCG/ACT inhaler Inhale 2 puffs into the lungs every 6 (six) hours as needed for wheezing.       Results for orders placed or performed during the hospital encounter of 02/25/20 (from the past 48 hour(s))  SARS  Coronavirus 2 by RT PCR (hospital order, performed in [facility] hospital lab) Nasopharyngeal Nasopharyngeal Swab     Status: None   Collection Time: 02/25/20  6:54 AM   Specimen: Nasopharyngeal Swab  Result Value Ref Range   SARS Coronavirus 2 NEGATIVE NEGATIVE    Comment: (NOTE) SARS-CoV-2 target nucleic acids are NOT DETECTED.  The SARS-CoV-2 RNA is generally detectable in upper and lower respiratory specimens during the acute phase of infection. The lowest concentration of SARS-CoV-2 viral copies this assay can detect is 250 copies / mL. A negative result does not preclude SARS-CoV-2 infection and should not be used as the sole basis for treatment or other patient management decisions.  A negative result may occur with improper specimen collection / handling, submission of specimen other than nasopharyngeal swab, presence of viral mutation(s) within the areas targeted by this assay, and inadequate number of  viral copies (<250 copies / mL). A negative result must be combined with clinical observations, patient history, and epidemiological information.  Fact Sheet for Patients:   StrictlyIdeas.no  Fact Sheet for Healthcare Providers: BankingDealers.co.za  This test is not yet approved or  cleared by the [place] FDA and has been authorized for detection and/or diagnosis of SARS-CoV-2 by FDA under an Emergency Use Authorization (EUA).  This EUA will remain in effect (meaning this test can be used) for the duration of the COVID-19 declaration under Section 564(b)(1) of the Act, 21 U.S.C. section 360bbb-3(b)(1), unless the authorization is terminated or revoked sooner.  Performed at [facility], [place] [address], [place], [place] [address]    No results found.  Review of Systems no recent fever, chills, nausea, vomiting or changes in his appetite.  Blood pressure (!) 138/58, pulse 64, temperature 98.5 F  (36.9 C), temperature source Oral, resp. rate 17, height 5\' 8"  (1.727 m), weight 95.3 kg, SpO2 99 %. Physical Exam  Well-nourished well-developed man in no apparent distress.  Alert and oriented x4.  Normal mood and affect.  Gait is flatfoot flatfoot in a postop shoe.  Right hallux is swollen and erythematous.  There is a large necrotic ulcer medially.  Skin is otherwise intact.  Pulses are palpable in the foot.  Intact sensibility to light touch dorsally and plantarly at the forefoot.   Assessment/Plan Chronic right hallux ulcer and osteomyelitis -to the operating room today for surgical treatment.  The risks and benefits of the alternative treatment options have been discussed in detail.  The patient wishes to proceed with surgery and specifically understands risks of bleeding, infection, nerve damage, blood clots, need for additional surgery, amputation and death.   Wylene Simmer, MD 03-22-20, 9:28 AM

## 2020-02-25 NOTE — Anesthesia Preprocedure Evaluation (Addendum)
Anesthesia Evaluation  Patient identified by MRN, date of birth, ID band Patient awake    Reviewed: Allergy & Precautions, NPO status , Patient's Chart, lab work & pertinent test results  Airway Mallampati: III  TM Distance: >3 FB Neck ROM: Full    Dental no notable dental hx.    Pulmonary asthma , sleep apnea and Continuous Positive Airway Pressure Ventilation , PE   Pulmonary exam normal breath sounds clear to auscultation       Cardiovascular hypertension, Pt. on medications + CAD, + Past MI, + Cardiac Stents, + CABG, +CHF and + DVT  Normal cardiovascular exam+ dysrhythmias Atrial Fibrillation  Rhythm:Regular Rate:Normal  ECG: SB, rate 56   Neuro/Psych negative neurological ROS  negative psych ROS   GI/Hepatic negative GI ROS, Neg liver ROS,   Endo/Other  Hypothyroidism   Renal/GU negative Renal ROS     Musculoskeletal negative musculoskeletal ROS (+)   Abdominal (+) + obese,   Peds  Hematology HLD   Anesthesia Other Findings Right foot osteomyelitis  Reproductive/Obstetrics                            Anesthesia Physical Anesthesia Plan  ASA: III  Anesthesia Plan: MAC   Post-op Pain Management:    Induction: Intravenous  PONV Risk Score and Plan: 1 and Ondansetron, Dexamethasone, Propofol infusion and Treatment may vary due to age or medical condition  Airway Management Planned: Simple Face Mask  Additional Equipment:   Intra-op Plan:   Post-operative Plan:   Informed Consent: I have reviewed the patients History and Physical, chart, labs and discussed the procedure including the risks, benefits and alternatives for the proposed anesthesia with the patient or authorized representative who has indicated his/her understanding and acceptance.     Dental advisory given  Plan Discussed with: CRNA  Anesthesia Plan Comments:        Anesthesia Quick Evaluation

## 2020-02-25 NOTE — Discharge Instructions (Addendum)
Ryan Simmer, MD [facility]  Please read the following information regarding your care after surgery.  Medications  You only need a prescription for the narcotic pain medicine (ex. oxycodone, Percocet, Norco).  All of the other medicines listed below are available over the counter. X acetominophen (Tylenol) 650 mg every 4-6 hours as you need for minor to moderate pain X tramadol as prescribed for severe pain X doxycycline 100mg  twice daily X zofran as needed for nausea  Narcotic pain medicine (ex. oxycodone, Percocet, Vicodin) will cause constipation.  To prevent this problem, take the following medicines while you are taking any pain medicine. X docusate sodium (Colace) 100 mg twice a day X senna (Senokot) 2 tablets twice a day  Weight Bearing X Bear weight only on your operated foot in the post-op shoe.   Cast / Splint / Dressing X Keep your splint, cast or dressing clean and dry.  Don't put anything (coat hanger, pencil, etc) down inside of it.  If it gets damp, use a hair dryer on the cool setting to dry it.  If it gets soaked, call the office to schedule an appointment for a cast change.   After your dressing, cast or splint is removed; you may shower, but do not soak or scrub the wound.  Allow the water to run over it, and then gently pat it dry.  Swelling It is normal for you to have swelling where you had surgery.  To reduce swelling and pain, keep your toes above your nose for at least 3 days after surgery.  It may be necessary to keep your foot or leg elevated for several weeks.  If it hurts, it should be elevated.  Follow Up Call my office at [phone] when you are discharged from the hospital or surgery center to schedule an appointment to be seen two weeks after surgery.  Call my office at [phone] if you develop a fever >101.5 F, nausea, vomiting, bleeding from the surgical site or severe pain.

## 2020-02-25 NOTE — Anesthesia Procedure Notes (Signed)
Procedure Name: MAC Date/Time: 02/25/2020 9:50 AM Performed by: Colin Benton, CRNA Pre-anesthesia Checklist: Patient identified, Emergency Drugs available, Suction available and Patient being monitored Patient Re-evaluated:Patient Re-evaluated prior to induction Oxygen Delivery Method: Simple face mask Preoxygenation: Pre-oxygenation with 100% oxygen Induction Type: IV induction Placement Confirmation: positive ETCO2 Dental Injury: Teeth and Oropharynx as per pre-operative assessment

## 2020-02-25 NOTE — Transfer of Care (Signed)
Immediate Anesthesia Transfer of Care Note  Patient: Lemoine Goyne  Procedure(s) Performed: Right hallux ulcer irrigation and excisional debridement amputation (Right )  Patient Location: PACU  Anesthesia Type:MAC  Level of Consciousness: awake, alert , oriented and patient cooperative  Airway & Oxygen Therapy: Patient Spontanous Breathing  Post-op Assessment: Report given to RN and Post -op Vital signs reviewed and stable  Post vital signs: Reviewed and stable  Last Vitals:  Vitals Value Taken Time  BP 103/66 02/25/20 1030  Temp    Pulse 61 02/25/20 1032  Resp 18 02/25/20 1032  SpO2 98 % 02/25/20 1032  Vitals shown include unvalidated device data.  Last Pain:  Vitals:   02/25/20 0704  TempSrc:   PainSc: 3          Complications: No complications documented.

## 2020-02-26 ENCOUNTER — Encounter (HOSPITAL_COMMUNITY): Payer: Self-pay | Admitting: Orthopedic Surgery

## 2020-03-01 LAB — AEROBIC/ANAEROBIC CULTURE W GRAM STAIN (SURGICAL/DEEP WOUND)

## 2020-06-21 ENCOUNTER — Telehealth: Payer: Self-pay | Admitting: Cardiology

## 2020-06-21 DIAGNOSIS — E785 Hyperlipidemia, unspecified: Secondary | ICD-10-CM

## 2020-06-21 DIAGNOSIS — I480 Paroxysmal atrial fibrillation: Secondary | ICD-10-CM

## 2020-06-21 DIAGNOSIS — I25810 Atherosclerosis of coronary artery bypass graft(s) without angina pectoris: Secondary | ICD-10-CM

## 2020-06-21 DIAGNOSIS — I10 Essential (primary) hypertension: Secondary | ICD-10-CM

## 2020-06-21 NOTE — Telephone Encounter (Signed)
I would check fasting lab in Dec  Joie Reamer Martinique MD, [facility]

## 2020-06-21 NOTE — Telephone Encounter (Signed)
Routed to MD and primary nurse to review

## 2020-06-21 NOTE — Telephone Encounter (Signed)
Patient wanted to know if he needs to have labs done prior to his appointment in December. Please assist

## 2020-06-24 ENCOUNTER — Other Ambulatory Visit: Payer: Self-pay

## 2020-06-24 DIAGNOSIS — I10 Essential (primary) hypertension: Secondary | ICD-10-CM

## 2020-06-24 DIAGNOSIS — I480 Paroxysmal atrial fibrillation: Secondary | ICD-10-CM

## 2020-06-24 DIAGNOSIS — E785 Hyperlipidemia, unspecified: Secondary | ICD-10-CM

## 2020-06-24 DIAGNOSIS — I25810 Atherosclerosis of coronary artery bypass graft(s) without angina pectoris: Secondary | ICD-10-CM

## 2020-06-24 NOTE — Telephone Encounter (Signed)
Called patient left message on personal voice mail to have fasting lab bmet,lipid and hepatic panels 3 to 5 days before Dec appointment.Lab orders mailed.

## 2020-09-24 ENCOUNTER — Other Ambulatory Visit: Payer: Self-pay | Admitting: Cardiology

## 2020-10-06 ENCOUNTER — Other Ambulatory Visit: Payer: Self-pay

## 2020-10-06 MED ORDER — ATORVASTATIN CALCIUM 80 MG PO TABS: 80.0000 mg | ORAL_TABLET | Freq: Every day | ORAL | 3 refills | Status: DC

## 2020-10-27 ENCOUNTER — Other Ambulatory Visit: Payer: Self-pay | Admitting: Physician Assistant

## 2020-11-18 LAB — LIPID PANEL
Chol/HDL Ratio: 2.8 ratio (ref 0.0–5.0)
Cholesterol, Total: 125 mg/dL (ref 100–199)
HDL: 45 mg/dL (ref >39)
LDL Chol Calc (NIH): 68 mg/dL (ref 0–99)
Triglycerides: 55 mg/dL (ref 0–149)
VLDL Cholesterol Cal: 12 mg/dL (ref 5–40)

## 2020-11-18 LAB — BASIC METABOLIC PANEL
BUN/Creatinine Ratio: 20 (ref 10–24)
BUN: 24 mg/dL (ref 8–27)
CO2: 26 mmol/L (ref 20–29)
Calcium: 8.9 mg/dL (ref 8.6–10.2)
Chloride: 103 mmol/L (ref 96–106)
Creatinine, Ser: 1.19 mg/dL (ref 0.76–1.27)
Glucose: 91 mg/dL (ref 70–99)
Potassium: 4.4 mmol/L (ref 3.5–5.2)
Sodium: 140 mmol/L (ref 134–144)
eGFR: 64 mL/min/{1.73_m2} (ref >59)

## 2020-11-18 LAB — HEPATIC FUNCTION PANEL
ALT: 27 IU/L (ref 0–44)
AST: 28 IU/L (ref 0–40)
Albumin: 4.2 g/dL (ref 3.7–4.7)
Alkaline Phosphatase: 83 IU/L (ref 44–121)
Bilirubin Total: 0.7 mg/dL (ref 0.0–1.2)
Bilirubin, Direct: 0.19 mg/dL (ref 0.00–0.40)
Total Protein: 6.1 g/dL (ref 6.0–8.5)

## 2020-12-05 NOTE — Progress Notes (Signed)
Cardiology Office Note:    Date:  12/08/2020   ID:  Ryan Ward, DOB April 25, 1945, MRN [id number]  PCP:  Leonard Downing, MD  [facility] Cardiologist:  Larry Alcock Martin[place], MD  [facility] Electrophysiologist:  None   Referring MD: Leonard Downing, *   Chief Complaint  Patient presents with   Congestive Heart Failure   Coronary Artery Disease     History of Present Illness:    Ryan Ward is a 75 y.o. male with a hx of CAD s/p CABG, chronic systolic HF, h/o RLE DVT/PE, HTN, HLD, OSA and PAF.  Patient had a history of inferior ST elevated MI in December 2016.  Cardiac catheterization showed occluded RCA and severe LAD and circumflex disease.  RCA was successfully treated with drug-eluting stent.  Periprocedural course complicated by cardiogenic shock, ventricular fibrillation and a complete heart block.  He also developed atrial fibrillation.  During the same admission, he developed right lower extremity DVT and the right upper lobe pulmonary emboli.  He was placed on Xarelto and completed the course of anticoagulation.  He underwent relook cardiac catheterization in March 2016 which showed patency of the RCA stent with a residual distal RPDA disease as well as proximal to mid LAD disease and also left circumflex disease.  He underwent successful CABG x4 (LIMA to LAD, SVG to diagonal, sequential SVG to proximal RPDA and the mid RPDA).  Postop course was complicated by atrial fibrillation that was treated successfully with amiodarone therapy.  After discharge, he developed heart failure and was treated with increased dose of Lasix.  Due to significant bradycardia on follow-up, metoprolol and amiodarone were discontinued.  Follow-up echo in July 2017 showed EF improved to 35 to 40%.  ICD was deferred.  Event monitor did not show any recurrence of atrial fibrillation as well.  In Feb this year he had osteomyelitis of the right hallux and underwent amputation.  From a cardiac  standpoint he is doing well. Denies any dyspnea or chest pain. Is not very active but is able to go up stairs and do yard work without difficulty. No dizziness. 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 There are some days he skips his lasix when he is not able to use rest room frequently.    Past Medical History:  Diagnosis Date   Asthma    VERY MILD   CAD (coronary artery disease)    a. 12/07/14 Inf STEMI/PCI: LAD 85p/m, 69m/d, LCX 80ost, RCA 30p/m, 67m/d, 100d (4.0x20 Promus Premier DES). Hosp course complicated by VF arrest, CGS, CHB req temp wire; b. 03/2015 Cath: LAD 85p/m, 95d, LCx 80ost, RCA 30-40p/m/d, RPDA 75, EF 30-35%, Nl CO; c. 04/06/2015 CABGx4 (LIMA->LAD, VG->Diag, Seq VG->prox RPDA->mid RPDA.   Chronic systolic CHF (congestive heart failure) ([place])    a. 03/2015 Echo: EF 30-35%, diff HK.   DVT (deep venous thrombosis) ([place]) 12/2014   RLE   History of blood transfusion 04/2013   S/P CABG   Hyperlipidemia    Hypertension    Hypertensive heart disease    Ischemic cardiomyopathy    a. 03/2015 Echo: EF 30-35%, diff HK, mild MR, mod dil RA, PASP 58mmHg.   Myocardial infarction ([place]) 12/2014   OSA (obstructive sleep apnea)    a. not using CPAP at home, does have machine (06/20/2017)   Paroxysmal atrial fibrillation ([place])    a. 12/2014 In setting of Inf MI->convertred spont.   Pulmonary embolism ([place])    a. 12/2014 R PT and peroneal vein  DVT and RUL PE-->Xarelto x 3 mos.    Past Surgical History:  Procedure Laterality Date   CARDIAC CATHETERIZATION N/A 12/07/2014   Procedure: Coronary Stent Intervention;  Surgeon: Mckinnley Smithey M Martinique, MD;  Location: [place] [facility];  Service: Cardiovascular;  Laterality: N/A;  STEMI   CARDIAC CATHETERIZATION N/A 12/07/2014   Procedure: IABP Insertion;  Surgeon: Noelani Harbach M [place], MD;  Location: [place] [facility];  Service: Cardiovascular;  Laterality: N/A;   CARDIAC CATHETERIZATION N/A 12/07/2014   Procedure: Temporary Pacemaker;  Surgeon: Viridiana Spaid M  [place], MD;  Location: [place] [facility];  Service: Cardiovascular;  Laterality: N/A;   CARDIAC CATHETERIZATION N/A 12/07/2014   Procedure: Left Heart Cath and Coronary Angiography;  Surgeon: Shaya Altamura M [place], MD;  Location: [place] [facility];  Service: Cardiovascular;  Laterality: N/A;   CARDIAC CATHETERIZATION N/A 03/29/2015   Procedure: Right/Left Heart Cath and Coronary Angiography;  Surgeon: Jilliana Burkes M [place], MD;  Location: [place] [facility];  Service: Cardiovascular;  Laterality: N/A;   CORONARY ARTERY BYPASS GRAFT N/A 04/06/2015   Procedure: CORONARY ARTERY BYPASS GRAFT TIMES FOUR  USING LEFT INTERNAL MAMMARY ARTERY TO THE LEFT ANTERIOR DESCENDING, BILATERAL GREATER SAPHENOUS ENDOVEIN HARVEST GRAFT TO PROXIMAL AND MID POSTERIOR DESCENDING AND DIAGONAL CORONARY ARTERIES AND PLACEMENT OR RIGHT FEMORAL A-LINE. ;  Surgeon: [name], MD;  Location: [place];  Service: Open Heart Surgery;  Laterality: N/A;   FRACTURE SURGERY     HERNIA REPAIR     I & D EXTREMITY Right 02/25/2020   Procedure: Right hallux ulcer irrigation and excisional debridement amputation;  Surgeon: Wylene Simmer, MD;  Location: [place];  Service: Orthopedics;  Laterality: Right;  76min   OPEN REDUCTION INTERNAL FIXATION (ORIF) PROXIMAL PHALANX Right 06/19/2017   Procedure: OPEN REDUCTION INTERNAL FIXATION (ORIF) PROXIMAL PHALANX;  Surgeon: Shona Needles, MD;  Location: [place];  Service: Orthopedics;  Laterality: Right;   ORIF ANKLE FRACTURE Right 06/19/2017   Procedure: OPEN REDUCTION INTERNAL FIXATION (ORIF) ANKLE FRACTURE;  Surgeon: Shona Needles, MD;  Location: [place];  Service: Orthopedics;  Laterality: Right;   ORIF ELBOW FRACTURE Right 06/19/2017   Procedure: OPEN REDUCTION ELBOW DISLOCATION WITH RADIAL HEAD REPLACEMENT;  Surgeon: Shona Needles, MD;  Location: [place];  Service: Orthopedics;  Laterality: Right;   TEE WITHOUT CARDIOVERSION N/A 04/06/2015   Procedure: TRANSESOPHAGEAL ECHOCARDIOGRAM (TEE);  Surgeon: [name], MD;  Location: [place];  Service: Open Heart Surgery;  Laterality: N/A;   TONSILLECTOMY     UMBILICAL HERNIA REPAIR      Current Medications: Current Meds  Medication Sig   aspirin EC 81 MG tablet Take 81 mg by mouth daily.   atorvastatin (LIPITOR) 80 MG tablet Take 1 tablet (80 mg total) by mouth daily. Advised patient to schedule OV for further refills   Cholecalciferol (VITAMIN D3) 5000 UNITS TABS Take 5,000 Units by mouth daily.    docusate sodium (COLACE) 100 MG capsule Take 1 capsule (100 mg total) by mouth 2 (two) times daily. While taking narcotic pain medicine.   famotidine (PEPCID) 20 MG tablet Take 20 mg by mouth daily as needed for heartburn or indigestion.   KLOR-CON M20 20 MEQ tablet TAKE 2 TABLETS BY MOUTH DAILY (Patient taking differently: Take 20 mEq by mouth 2 (two) times daily.)   levothyroxine (SYNTHROID) 50 MCG tablet TAKE 1 TABLET BY MOUTH TWICE A DAY (Patient taking differently: Take 50 mcg by mouth 2 (two) times daily.)   losartan (  COZAAR) 25 MG tablet TAKE 1 TABLET BY MOUTH EVERY DAY   Multiple Vitamin (MULTIVITAMIN WITH MINERALS) TABS tablet Take 1 tablet by mouth daily.   ondansetron (ZOFRAN) 4 MG tablet Take 1 tablet (4 mg total) by mouth daily as needed for nausea or vomiting.   PROAIR HFA 108 (90 Base) MCG/ACT inhaler Inhale 2 puffs into the lungs every 6 (six) hours as needed for wheezing.    traMADol (ULTRAM) 50 MG tablet tramadol 50 mg tablet  TAKE 1 TABLET (50 MG TOTAL) BY MOUTH EVERY 6 (SIX) HOURS AS NEEDED FOR UP TO 3 DAYS.   [DISCONTINUED] furosemide (LASIX) 40 MG tablet TAKE 1 TABLET BY MOUTH EVERY DAY (Patient taking differently: Take 40 mg by mouth daily.)     Allergies:   Oxycodone   Social History   Socioeconomic History   Marital status: Widowed    Spouse name: Not on file   Number of children: Not on file   Years of education: Not on file   Highest education level: Not on file  Occupational History   Not on file  Tobacco Use    Smoking status: Never   Smokeless tobacco: Never  Vaping Use   Vaping Use: Never used  Substance and Sexual Activity   Alcohol use: Not Currently    Alcohol/week: 0.0 standard drinks   Drug use: Never   Sexual activity: Yes  Other Topics Concern   Not on file  Social History Narrative   Works with [employer]   Social Determinants of Health   Financial Resource Strain: Not on file  Food Insecurity: Not on file  Transportation Needs: Not on file  Physical Activity: Not on file  Stress: Not on file  Social Connections: Not on file     Family History: The patient's family history includes Heart attack in his father.  ROS:   Please see the history of present illness.     All other systems reviewed and are negative.  EKGs/Labs/Other Studies Reviewed:    The following studies were reviewed today:  Echo 07/18/2015 LV EF: 35% -   40%  Study Conclusions   - Left ventricle: The cavity size was mildly dilated. Systolic    function was moderately reduced. The estimated ejection fraction    was in the range of 35% to 40%. Diffuse hypokinesis. There is    akinesis of the entireinferolateral and inferior myocardium.    Features are consistent with a pseudonormal left ventricular    filling pattern, with concomitant abnormal relaxation and    increased filling pressure (grade 2 diastolic dysfunction).    Doppler parameters are consistent with high ventricular filling    pressure.  - Aortic valve: Moderate diffuse thickening and calcification.    There was trivial regurgitation.  - Mitral valve: There was mild regurgitation.  - Left atrium: The atrium was mildly dilated.  - Right atrium: The atrium was mildly dilated.  - Tricuspid valve: There was trivial regurgitation.  - Pulmonary arteries: PA peak pressure: 31 mm Hg (S).   EKG:  EKG is ordered today.  The ekg ordered today demonstrates normal sinus rhythm, rate 61, old inferior infarct, poor R wave progression in the anterior  leads.  Recent Labs: 02/10/2020: Hemoglobin 14.6; Platelets 226 11/18/2020: ALT 27; BUN 24; Creatinine, Ser 1.19; Potassium 4.4; Sodium 140  Recent Lipid Panel    Component Value Date/Time   CHOL 125 11/18/2020 1017   TRIG 55 11/18/2020 1017   HDL 45 11/18/2020 1017  CHOLHDL 2.8 11/18/2020 1017   CHOLHDL 2.3 10/10/2015 1428   VLDL 9 10/10/2015 1428   LDLCALC 68 11/18/2020 1017    Physical Exam:    VS:  BP 125/68   Pulse 61   Ht 5\' 8"  (1.727 m)   Wt 214 lb 12.8 oz (97.4 kg)   SpO2 99%   BMI 32.66 kg/m     Wt Readings from Last 3 Encounters:  12/08/20 214 lb 12.8 oz (97.4 kg)  02/25/20 210 lb (95.3 kg)  09/25/19 215 lb 6.4 oz (97.7 kg)     GEN:  Well nourished, well developed in no acute distress HEENT: Normal NECK: No JVD; No carotid bruits LYMPHATICS: No lymphadenopathy CARDIAC: RRR, no murmurs, rubs, gallops RESPIRATORY:  Clear to auscultation without rales, wheezing or rhonchi  ABDOMEN: Soft, non-tender, non-distended MUSCULOSKELETAL:  No edema; No deformity  SKIN: Warm and dry NEUROLOGIC:  Alert and oriented x 3 PSYCHIATRIC:  Normal affect    ASSESSMENT:    1. Coronary artery disease involving coronary bypass graft of native heart without angina pectoris   2. Chronic systolic heart failure ([place])   3. Hyperlipidemia LDL goal <70   4. Essential hypertension     PLAN:    In order of problems listed above:  CAD s/p CABG: Continue aspirin and statin.  He has no significant anginal symptoms.  Chronic systolic heart failure: Euvolemic on physical exam. I would like to update Echo. If he still has reduced EF we should consider switching losartan to Entresto and adding an SGLT 2 inhibitor +/- aldactone to optimize therapy. Doubt he is a candidate for beta blocker given history of bradycardia.   Hypertension: Blood pressure controlled  Hyperlipidemia: Continue Lipitor. LDL 68 at goal  PAF: Occurred in the postop setting.  No recurrence.    History of DVT:  Completed a course of anticoagulation therapy. 7.   Hypothyroidism - unusual that he takes his synthroid BID instead of a daily dose. Should follow up with Dr Arelia Sneddon.    Medication Adjustments/Labs and Tests Ordered: Current medicines are reviewed at length with the patient today.  Concerns regarding medicines are outlined above.  Orders Placed This Encounter  Procedures   EKG 12-Lead    Meds ordered this encounter  Medications   furosemide (LASIX) 40 MG tablet    Sig: Take 1 tablet (40 mg total) by mouth daily.    Dispense:  90 tablet    Refill:  2     There are no Patient Instructions on file for this visit.   Signed, Karlye Ihrig [place], MD  12/08/2020 9:09 AM    [facility]

## 2020-12-08 ENCOUNTER — Other Ambulatory Visit: Payer: Self-pay

## 2020-12-08 ENCOUNTER — Encounter: Payer: Self-pay | Admitting: Cardiology

## 2020-12-08 ENCOUNTER — Ambulatory Visit (INDEPENDENT_AMBULATORY_CARE_PROVIDER_SITE_OTHER): Payer: Medicare Other | Admitting: Cardiology

## 2020-12-08 VITALS — BP 125/68 | HR 61 | Ht 68.0 in | Wt 214.8 lb

## 2020-12-08 DIAGNOSIS — E785 Hyperlipidemia, unspecified: Secondary | ICD-10-CM | POA: Diagnosis not present

## 2020-12-08 DIAGNOSIS — I25810 Atherosclerosis of coronary artery bypass graft(s) without angina pectoris: Secondary | ICD-10-CM

## 2020-12-08 DIAGNOSIS — I5022 Chronic systolic (congestive) heart failure: Secondary | ICD-10-CM

## 2020-12-08 DIAGNOSIS — I10 Essential (primary) hypertension: Secondary | ICD-10-CM | POA: Diagnosis not present

## 2020-12-08 MED ORDER — FUROSEMIDE 40 MG PO TABS: 40.0000 mg | ORAL_TABLET | Freq: Every day | ORAL | 2 refills | Status: DC

## 2020-12-08 NOTE — Patient Instructions (Signed)
Medication Instructions:  Continue same medications *If you need a refill on your cardiac medications before your next appointment, please call your pharmacy*   Lab Work: None ordered   Testing/Procedures: Echo   Follow-Up: At [facility], you and your health needs are our priority.  As part of our continuing mission to provide you with exceptional heart care, we have created designated Provider Care Teams.  These Care Teams include your primary Cardiologist (physician) and Advanced Practice Providers (APPs -  Physician Assistants and Nurse Practitioners) who all work together to provide you with the care you need, when you need it.  We recommend signing up for the patient portal called "MyChart".  Sign up information is provided on this After Visit Summary.  MyChart is used to connect with patients for Virtual Visits (Telemedicine).  Patients are able to view lab/test results, encounter notes, upcoming appointments, etc.  Non-urgent messages can be sent to your provider as well.   To learn more about what you can do with MyChart, go to [email or web address].      Your next appointment:  To Be  Determined after Echo    The format for your next appointment: Office   Provider: Dr.Jordan

## 2020-12-23 ENCOUNTER — Other Ambulatory Visit: Payer: Self-pay

## 2020-12-23 ENCOUNTER — Ambulatory Visit (HOSPITAL_COMMUNITY): Payer: Medicare Other | Attending: Cardiology

## 2020-12-23 DIAGNOSIS — E785 Hyperlipidemia, unspecified: Secondary | ICD-10-CM | POA: Insufficient documentation

## 2020-12-23 DIAGNOSIS — I25810 Atherosclerosis of coronary artery bypass graft(s) without angina pectoris: Secondary | ICD-10-CM | POA: Diagnosis present

## 2020-12-23 DIAGNOSIS — I10 Essential (primary) hypertension: Secondary | ICD-10-CM | POA: Diagnosis present

## 2020-12-23 DIAGNOSIS — I5022 Chronic systolic (congestive) heart failure: Secondary | ICD-10-CM

## 2020-12-23 LAB — ECHOCARDIOGRAM COMPLETE: S' Lateral: 5 cm

## 2020-12-23 MED ORDER — PERFLUTREN LIPID MICROSPHERE
1.0000 mL | INTRAVENOUS | Status: AC | PRN
  Administered 2020-12-23: 2 mL via INTRAVENOUS

## 2020-12-27 ENCOUNTER — Other Ambulatory Visit: Payer: Self-pay

## 2020-12-27 DIAGNOSIS — E785 Hyperlipidemia, unspecified: Secondary | ICD-10-CM

## 2020-12-27 DIAGNOSIS — Z79899 Other long term (current) drug therapy: Secondary | ICD-10-CM

## 2020-12-27 MED ORDER — EMPAGLIFLOZIN 10 MG PO TABS: 10.0000 mg | ORAL_TABLET | Freq: Every day | ORAL | 6 refills | Status: DC

## 2020-12-27 MED ORDER — ENTRESTO 24-26 MG PO TABS: 1.0000 | ORAL_TABLET | Freq: Two times a day (BID) | ORAL | 6 refills | Status: DC

## 2020-12-27 MED ORDER — SPIRONOLACTONE 25 MG PO TABS: 25.0000 mg | ORAL_TABLET | Freq: Every day | ORAL | 6 refills | Status: DC

## 2020-12-29 ENCOUNTER — Telehealth: Payer: Self-pay | Admitting: Cardiology

## 2020-12-29 NOTE — Telephone Encounter (Signed)
Spoke with pt regarding echo results and recommendations. All questions answered for pt. Pt verbalizes understanding.

## 2020-12-29 NOTE — Telephone Encounter (Signed)
Patient was calling back with question concerning his echo results. Please advise

## 2021-02-15 NOTE — Progress Notes (Signed)
Cardiology Clinic Note   Patient Name: Ryan Ward Date of Encounter: 02/16/2021  Primary Care Provider:  Leonard Downing, MD Primary Cardiologist:  Peter Martinique, MD  Patient Profile    Ryan Ward 76 year old male presents the clinic today for follow-up evaluation of his coronary artery disease and hypertension.  Past Medical History    Past Medical History:  Diagnosis Date   Asthma    VERY MILD   CAD (coronary artery disease)    a. 12/07/14 Inf STEMI/PCI: LAD 85p/m, 1m/d, LCX 80ost, RCA 30p/m, 38m/d, 100d (4.0x20 Promus Premier DES). Hosp course complicated by VF arrest, CGS, CHB req temp wire; b. 03/2015 Cath: LAD 85p/m, 95d, LCx 80ost, RCA 30-40p/m/d, RPDA 75, EF 30-35%, Nl CO; c. 04/06/2015 CABGx4 (LIMA->LAD, VG->Diag, Seq VG->prox RPDA->mid RPDA.   Chronic systolic CHF (congestive heart failure) ([place])    a. 03/2015 Echo: EF 30-35%, diff HK.   DVT (deep venous thrombosis) ([place]) 12/2014   RLE   History of blood transfusion 04/2013   S/P CABG   Hyperlipidemia    Hypertension    Hypertensive heart disease    Ischemic cardiomyopathy    a. 03/2015 Echo: EF 30-35%, diff HK, mild MR, mod dil RA, PASP 43mmHg.   Myocardial infarction ([place]) 12/2014   OSA (obstructive sleep apnea)    a. not using CPAP at home, does have machine (06/20/2017)   Paroxysmal atrial fibrillation ([place])    a. 12/2014 In setting of Inf MI->convertred spont.   Pulmonary embolism ([place])    a. 12/2014 R PT and peroneal vein DVT and RUL PE-->Xarelto x 3 mos.   Past Surgical History:  Procedure Laterality Date   CARDIAC CATHETERIZATION N/A 12/07/2014   Procedure: Coronary Stent Intervention;  Surgeon: Peter M Martinique, MD;  Location: [facility];  Service: Cardiovascular;  Laterality: N/A;  STEMI   CARDIAC CATHETERIZATION N/A 12/07/2014   Procedure: IABP Insertion;  Surgeon: Peter M Martinique, MD;  Location: [facility];  Service: Cardiovascular;  Laterality: N/A;   CARDIAC CATHETERIZATION N/A  12/07/2014   Procedure: Temporary Pacemaker;  Surgeon: Peter M Martinique, MD;  Location: [facility];  Service: Cardiovascular;  Laterality: N/A;   CARDIAC CATHETERIZATION N/A 12/07/2014   Procedure: Left Heart Cath and Coronary Angiography;  Surgeon: Peter M Martinique, MD;  Location: [facility];  Service: Cardiovascular;  Laterality: N/A;   CARDIAC CATHETERIZATION N/A 03/29/2015   Procedure: Right/Left Heart Cath and Coronary Angiography;  Surgeon: Peter M Martinique, MD;  Location: [facility];  Service: Cardiovascular;  Laterality: N/A;   CORONARY ARTERY BYPASS GRAFT N/A 04/06/2015   Procedure: CORONARY ARTERY BYPASS GRAFT TIMES FOUR  USING LEFT INTERNAL MAMMARY ARTERY TO THE LEFT ANTERIOR DESCENDING, BILATERAL GREATER SAPHENOUS ENDOVEIN HARVEST GRAFT TO PROXIMAL AND MID POSTERIOR DESCENDING AND DIAGONAL CORONARY ARTERIES AND PLACEMENT OR RIGHT FEMORAL A-LINE. ;  Surgeon: [name], MD;  Location: [facility];  Service: Open Heart Surgery;  Laterality: N/A;   FRACTURE SURGERY     HERNIA REPAIR     I & D EXTREMITY Right 02/25/2020   Procedure: Right hallux ulcer irrigation and excisional debridement amputation;  Surgeon: Wylene Simmer, MD;  Location: [facility];  Service: Orthopedics;  Laterality: Right;  81min   OPEN REDUCTION INTERNAL FIXATION (ORIF) PROXIMAL PHALANX Right 06/19/2017   Procedure: OPEN REDUCTION INTERNAL FIXATION (ORIF) PROXIMAL PHALANX;  Surgeon: Shona Needles, MD;  Location: [facility];  Service: Orthopedics;  Laterality: Right;   ORIF ANKLE  FRACTURE Right 06/19/2017   Procedure: OPEN REDUCTION INTERNAL FIXATION (ORIF) ANKLE FRACTURE;  Surgeon: Shona Needles, MD;  Location: [facility];  Service: Orthopedics;  Laterality: Right;   ORIF ELBOW FRACTURE Right 06/19/2017   Procedure: OPEN REDUCTION ELBOW DISLOCATION WITH RADIAL HEAD REPLACEMENT;  Surgeon: Shona Needles, MD;  Location: [facility];  Service: Orthopedics;  Laterality: Right;   TEE WITHOUT CARDIOVERSION N/A 04/06/2015    Procedure: TRANSESOPHAGEAL ECHOCARDIOGRAM (TEE);  Surgeon: [name], MD;  Location: [facility];  Service: Open Heart Surgery;  Laterality: N/A;   TONSILLECTOMY     UMBILICAL HERNIA REPAIR      Allergies  Allergies  Allergen Reactions   Oxycodone Nausea And Vomiting    History of Present Illness    Ryan Ward has a PMH of STEMI status post percutaneous coronary angioplasty, complete heart block, cardiogenic shock, hypertension, ventricular fibrillation, chronic combined systolic and diastolic CHF, PE, paroxysmal atrial fibrillation, ischemic cardiomyopathy, and sinus bradycardia.  He had inferior STEMI 12/16 and received PCI with DES to his RCA.  His procedure was complicated by cardiogenic shock, ventricular fibrillation and complete heart block.  He was noted to develop atrial fibrillation.  During the admission he developed right lower extremity DVT and right upper lobe PE.  He was placed on Xarelto and completed a course of anticoagulation.  He underwent repeat cardiac catheterization 3/16 which showed patent RCA stent with residual distal RPDA disease and proximal mid LAD disease as well as left circumflex disease.  He underwent CABG x4 on 04/06/2015.  His CABG was complicated by atrial fibrillation which was successfully treated with amiodarone therapy.  Due to significant bradycardia on follow-up metoprolol and amiodarone were discontinued.  His echocardiogram 7/17 showed an EF of 35-40%.  His ICD was deferred.  He wore a cardiac event monitor which did not show recurrence of atrial fibrillation.  He was noted to have osteomyelitis of his right hallux and underwent amputation February.  He was seen in follow-up by Dr. Martinique 12/08/2020.  During that time he continued to do well from a cardiac standpoint.  He denied chest pain dyspnea.  He is not very active but was able to go up the stairs and do yard work without difficulty.  He denied dizziness.  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  There were days when he admitted to skipping his furosemide due to not being able to use the restroom frequently.  He was transitioned to [place], spironolactone and placed on Jardiance.  He presents to the clinic today for follow-up evaluation states he has noticed that since starting spironolactone, Jardiance, and Entresto he is having more throat clearing.  He was previously intolerant of lisinopril.  He also notes that the medication is very expensive costing him around $395 per month.  I will give him the patient assistance paperwork so that he may fill about.  He reports that he has been more physically active and noticing more lightheadedness/dizziness.  We reviewed his symptoms prior to starting medications and he noted slight dizziness prior to his new medications.  He feels it is more pounds now.  He has not been checking his blood pressure at home.  He does report some dietary discretion with regards to sugar.  I will decrease his furosemide and potassium to every other day, order a BMP, have him continue to weigh himself daily, and plan follow-up for 2 to 3 months.  Today he denies chest pain, shortness of breath, lower extremity  edema, fatigue, palpitations, melena, hematuria, hemoptysis, diaphoresis, weakness, presyncope, syncope, orthopnea, and PND.   Home Medications    Prior to Admission medications   Medication Sig Start Date End Date Taking? Authorizing Provider  aspirin EC 81 MG tablet Take 81 mg by mouth daily.    [provider]  atorvastatin (LIPITOR) 80 MG tablet Take 1 tablet (80 mg total) by mouth daily. Advised patient to schedule OV for further refills 10/06/20   Ledora Bottcher, PA  Cholecalciferol (VITAMIN D3) 5000 UNITS TABS Take 5,000 Units by mouth daily.     [provider]  docusate sodium (COLACE) 100 MG capsule Take 1 capsule (100 mg total) by mouth 2 (two) times daily. While taking narcotic pain medicine. 02/25/20   Corky Sing, PA-C  empagliflozin (JARDIANCE) 10 MG TABS tablet Take 1 tablet (10 mg total) by mouth daily before breakfast. 12/27/20   Martinique, Peter M, MD  famotidine (PEPCID) 20 MG tablet Take 20 mg by mouth daily as needed for heartburn or indigestion.    [provider]  furosemide (LASIX) 40 MG tablet Take 1 tablet (40 mg total) by mouth daily. 12/08/20   Martinique, Peter M, MD  KLOR-CON M20 20 MEQ tablet TAKE 2 TABLETS BY MOUTH DAILY Patient taking differently: Take 20 mEq by mouth 2 (two) times daily. 10/07/19   Almyra Deforest, PA  levothyroxine (SYNTHROID) 50 MCG tablet TAKE 1 TABLET BY MOUTH TWICE A DAY Patient taking differently: Take 50 mcg by mouth 2 (two) times daily. 10/07/19   Almyra Deforest, PA  Multiple Vitamin (MULTIVITAMIN WITH MINERALS) TABS tablet Take 1 tablet by mouth daily.    [provider]  ondansetron (ZOFRAN) 4 MG tablet Take 1 tablet (4 mg total) by mouth daily as needed for nausea or vomiting. 02/25/20   Corky Sing, PA-C  PROAIR HFA 108 [phone] Base) MCG/ACT inhaler Inhale 2 puffs into the lungs every 6 (six) hours as needed for wheezing.  02/25/15   [provider]  sacubitril-valsartan (ENTRESTO) 24-26 MG Take 1 tablet by mouth 2 (two) times daily. 12/27/20   Martinique, Peter M, MD  senna (SENOKOT) 8.6 MG TABS tablet Take 2 tablets (17.2 mg total) by mouth 2 (two) times daily. Patient not taking: Reported on 12/08/2020 02/25/20   Corky Sing, PA-C  spironolactone (ALDACTONE) 25 MG tablet Take 1 tablet (25 mg total) by mouth daily. 12/27/20   Martinique, Peter M, MD  traMADol (ULTRAM) 50 MG tablet tramadol 50 mg tablet  TAKE 1 TABLET (50 MG TOTAL) BY MOUTH EVERY 6 (SIX) HOURS AS NEEDED FOR UP TO 3 DAYS.    [provider]    Family History    Family History  Problem Relation Age of Onset   Heart attack Father    He indicated that his mother is deceased. He indicated that his father is deceased.  Social History    Social History    Socioeconomic History   Marital status: Widowed    Spouse name: Not on file   Number of children: Not on file   Years of education: Not on file   Highest education level: Not on file  Occupational History   Not on file  Tobacco Use   Smoking status: Never   Smokeless tobacco: Never  Vaping Use   Vaping Use: Never used  Substance and Sexual Activity   Alcohol use: Not Currently    Alcohol/week: 0.0 standard drinks   Drug use: Never   Sexual  activity: Yes  Other Topics Concern   Not on file  Social History Narrative   Works with [name]   Social Determinants of Health   Financial Resource Strain: Not on file  Food Insecurity: Not on file  Transportation Needs: Not on file  Physical Activity: Not on file  Stress: Not on file  Social Connections: Not on file  Intimate Partner Violence: Not on file     Review of Systems    General:  No chills, fever, night sweats or weight changes.  Cardiovascular:  No chest pain, dyspnea on exertion, edema, orthopnea, palpitations, paroxysmal nocturnal dyspnea. Dermatological: No rash, lesions/masses Respiratory: No cough, dyspnea Urologic: No hematuria, dysuria Abdominal:   No nausea, vomiting, diarrhea, bright red blood per rectum, melena, or hematemesis Neurologic:  No visual changes, wkns, changes in mental status. All other systems reviewed and are otherwise negative except as noted above.  Physical Exam    VS:  BP 106/60    Pulse 78    Ht 5\' 8"  (1.727 m)    Wt 210 lb 3.2 oz (95.3 kg)    SpO2 97%    BMI 31.96 kg/m  , BMI Body mass index is 31.96 kg/m. GEN: Well nourished, well developed, in no acute distress. HEENT: normal. Neck: Supple, no JVD, carotid bruits, or masses. Cardiac: RRR, no murmurs, rubs, or gallops. No clubbing, cyanosis, edema.  Radials/DP/PT 2+ and equal bilaterally.  Respiratory:  Respirations regular and unlabored, clear to auscultation bilaterally. GI: Soft, nontender, nondistended, BS + x 4. MS:  no deformity or atrophy. Skin: warm and dry, no rash. Neuro:  Strength and sensation are intact. Psych: Normal affect.  Accessory Clinical Findings    Recent Labs: 11/18/2020: ALT 27; BUN 24; Creatinine, Ser 1.19; Potassium 4.4; Sodium 140   Recent Lipid Panel    Component Value Date/Time   CHOL 125 11/18/2020 1017   TRIG 55 11/18/2020 1017   HDL 45 11/18/2020 1017   CHOLHDL 2.8 11/18/2020 1017   CHOLHDL 2.3 10/10/2015 1428   VLDL 9 10/10/2015 1428   LDLCALC 68 11/18/2020 1017    ECG personally reviewed by me today-none today.  Assessment & Plan   1.  Chronic systolic CHF-euvolemic today.  No increased DOE or activity intolerance.  Previously switched from losartan to [place], [place], and spironolactone.  Not felt to be a good candidate for beta-blocker given history of bradycardia.  Patient reports that he is not sure if he is tolerating Entresto due to increased throat clearing.  Reports allergy to lisinopril.  Tolerated losartan well.  Notices increased lightheadedness. Continue Entresto  Continue spironolactone, Jardiance,  Decrease furosemide, potassium to every other day Order BMP today Repeat echocardiogram 1 month after medications been optimized.  Hyperlipidemia-11/18/2020: Cholesterol, Total 125; HDL 45; LDL Chol Calc (NIH) 68; Triglycerides 55 Continue aspirin, atorvastatin Heart healthy low-sodium diet-salty 6 given Increase physical activity as tolerated  Essential hypertension-BP today 106/60.  Well-controlled at home. Continue spironolactone, Entresto Heart healthy low-sodium diet-salty 6 given Increase physical activity as tolerated  Coronary artery disease-no recent episodes of arm neck back or chest discomfort.  Underwent CABG x4 04/06/2015.  Details above. Continue atorvastatin, aspirin, Heart healthy low-sodium diet-salty 6 given Increase physical activity as tolerated   Disposition: Follow-up with Dr. Martinique or me in 2-3 month.  Jossie Ng.  Shelise Maron NP-C    02/16/2021, 9:36 AM [address] [address] Office [phone] Fax [phone]  Notice: This dictation was prepared with Viviann Spare  dictation along with smaller phrase technology. Any transcriptional errors that result from this process are unintentional and may not be corrected upon review.  I spent 14 minutes examining this patient, reviewing medications, and using patient centered shared decision making involving her cardiac care.  Prior to her visit I spent greater than 20 minutes reviewing her past medical history,  medications, and prior cardiac tests.

## 2021-02-16 ENCOUNTER — Other Ambulatory Visit: Payer: Self-pay

## 2021-02-16 ENCOUNTER — Encounter: Payer: Self-pay | Admitting: General Practice

## 2021-02-16 ENCOUNTER — Ambulatory Visit (INDEPENDENT_AMBULATORY_CARE_PROVIDER_SITE_OTHER): Payer: Medicare Other | Admitting: General Practice

## 2021-02-16 VITALS — BP 106/60 | HR 78 | Ht 68.0 in | Wt 210.2 lb

## 2021-02-16 DIAGNOSIS — I10 Essential (primary) hypertension: Secondary | ICD-10-CM | POA: Diagnosis not present

## 2021-02-16 DIAGNOSIS — I5022 Chronic systolic (congestive) heart failure: Secondary | ICD-10-CM | POA: Diagnosis not present

## 2021-02-16 DIAGNOSIS — E785 Hyperlipidemia, unspecified: Secondary | ICD-10-CM | POA: Diagnosis not present

## 2021-02-16 DIAGNOSIS — I25810 Atherosclerosis of coronary artery bypass graft(s) without angina pectoris: Secondary | ICD-10-CM | POA: Diagnosis not present

## 2021-02-16 LAB — BASIC METABOLIC PANEL
BUN/Creatinine Ratio: 20 (ref 10–24)
BUN: 26 mg/dL (ref 8–27)
CO2: 26 mmol/L (ref 20–29)
Calcium: 9.6 mg/dL (ref 8.6–10.2)
Chloride: 99 mmol/L (ref 96–106)
Creatinine, Ser: 1.33 mg/dL — ABNORMAL HIGH (ref 0.76–1.27)
Glucose: 76 mg/dL (ref 70–99)
Potassium: 4.8 mmol/L (ref 3.5–5.2)
Sodium: 139 mmol/L (ref 134–144)
eGFR: 56 mL/min/{1.73_m2} — ABNORMAL LOW (ref >59)

## 2021-02-16 MED ORDER — FUROSEMIDE 40 MG PO TABS: 40.0000 mg | ORAL_TABLET | ORAL | 2 refills | Status: DC

## 2021-02-16 MED ORDER — POTASSIUM CHLORIDE CRYS ER 20 MEQ PO TBCR: 40.0000 meq | EXTENDED_RELEASE_TABLET | ORAL | 3 refills | Status: DC

## 2021-02-16 NOTE — Patient Instructions (Signed)
Medication Instructions:  DECREASE FUROSEMIDE (LASIX) TAKE EVERY-OTHER-DAY  DECREASE POTASSIUM TAKE EVERY-OTHER-DAY  *If you need a refill on your cardiac medications before your next appointment, please call your pharmacy*  Lab Work:   Testing/Procedures:  BMET TODAY   NONE  Special Instructions PLEASE READ AND FOLLOW SALTY 6-ATTACHED-1,800mg  daily  PLEASE CALL IF YOU GAIN 3 POUNDS IN A DAY OR 5 POUNDS IN A WEEK.  Follow-Up: Your next appointment:  2-3 month(s) In Person with Peter Martinique, MD  or Coletta Memos, FNP     At [facility], you and your health needs are our priority.  As part of our continuing mission to provide you with exceptional heart care, we have created designated Provider Care Teams.  These Care Teams include your primary Cardiologist (physician) and Advanced Practice Providers (APPs -  Physician Assistants and Nurse Practitioners) who all work together to provide you with the care you need, when you need it.            6 SALTY THINGS TO AVOID     1,800MG  DAILY

## 2021-05-15 ENCOUNTER — Other Ambulatory Visit: Payer: Self-pay | Admitting: Cardiology

## 2021-05-30 ENCOUNTER — Ambulatory Visit: Payer: Medicare Other | Admitting: General Practice

## 2021-06-12 NOTE — Progress Notes (Unsigned)
Cardiology Clinic Note   Patient Name: Ryan Ward Date of Encounter: 06/14/2021  Primary Care Provider:  Leonard Downing, MD Primary Cardiologist:  Peter Martinique, MD  Patient Profile    Ryan Ward 76 year old male presents the clinic today for follow-up evaluation of his coronary artery disease and hypertension.  Past Medical History    Past Medical History:  Diagnosis Date   Asthma    VERY MILD   CAD (coronary artery disease)    a. 12/07/14 Inf STEMI/PCI: LAD 85p/m, 3md, LCX 80ost, RCA 30p/m, 450m, 100d (4.0x20 Promus Premier DES). Hosp course complicated by VF arrest, CGS, CHB req temp wire; b. 03/2015 Cath: LAD 85p/m, 95d, LCx 80ost, RCA 30-40p/m/d, RPDA 75, EF 30-35%, Nl CO; c. 04/06/2015 CABGx4 (LIMA->LAD, VG->Diag, Seq VG->prox RPDA->mid RPDA.   Chronic systolic CHF (congestive heart failure) ([place]   a. 03/2015 Echo: EF 30-35%, diff HK.   DVT (deep venous thrombosis) ([place]12/2016   RLE   History of blood transfusion 04/2013   S/P CABG   Hyperlipidemia    Hypertension    Hypertensive heart disease    Ischemic cardiomyopathy    a. 03/2015 Echo: EF 30-35%, diff HK, mild MR, mod dil RA, PASP 4994m.   Myocardial infarction ([place]/2016   OSA (obstructive sleep apnea)    a. not using CPAP at home, does have machine (06/20/2017)   Paroxysmal atrial fibrillation ([place]  a. 12/2014 In setting of Inf MI->convertred spont.   Pulmonary embolism ([place]  a. 12/2014 R PT and peroneal vein DVT and RUL PE-->Xarelto x 3 mos.   Past Surgical History:  Procedure Laterality Date   CARDIAC CATHETERIZATION N/A 12/07/2014   Procedure: Coronary Stent Intervention;  Surgeon: Peter M JorMartiniqueD;  Location: [facility] [place] [facility];  Service: Cardiovascular;  Laterality: N/A;  STEMI   CARDIAC CATHETERIZATION N/A 12/07/2014   Procedure: IABP Insertion;  Surgeon: Peter M JorMartiniqueD;  Location: [facility] [facility];  Service: Cardiovascular;  Laterality: N/A;   CARDIAC CATHETERIZATION N/A  12/07/2014   Procedure: Temporary Pacemaker;  Surgeon: Peter M JorMartiniqueD;  Location: [facility] [place] [facility];  Service: Cardiovascular;  Laterality: N/A;   CARDIAC CATHETERIZATION N/A 12/07/2014   Procedure: Left Heart Cath and Coronary Angiography;  Surgeon: Peter M JorMartiniqueD;  Location: [facility] [place] [facility];  Service: Cardiovascular;  Laterality: N/A;   CARDIAC CATHETERIZATION N/A 03/29/2015   Procedure: Right/Left Heart Cath and Coronary Angiography;  Surgeon: Peter M JorMartiniqueD;  Location: [facility] [place] [facility];  Service: Cardiovascular;  Laterality: N/A;   CORONARY ARTERY BYPASS GRAFT N/A 04/06/2015   Procedure: CORONARY ARTERY BYPASS GRAFT TIMES FOUR  USING LEFT INTERNAL MAMMARY ARTERY TO THE LEFT ANTERIOR DESCENDING, BILATERAL GREATER SAPHENOUS ENDOVEIN HARVEST GRAFT TO PROXIMAL AND MID POSTERIOR DESCENDING AND DIAGONAL CORONARY ARTERIES AND PLACEMENT OR RIGHT FEMORAL A-LINE. ;  Surgeon: EdwGrace IsaacD;  [facility] [place];  Laterality: N/A;   FRACTURE SURGERY     HERNIA REPAIR     I & D EXTREMITY Right 02/25/2020   Procedure: Right hallux ulcer irrigation and excisional debridement amputation;  Surgeon: HewWylene SimmerD;  [facility];  Laterality: Right;  23m1m OPEN REDUCTION INTERNAL FIXATION (ORIF) PROXIMAL PHALANX Right 06/19/2017   Procedure: OPEN REDUCTION INTERNAL FIXATION (ORIF) PROXIMAL PHALANX;  Surgeon: HaddShona Needles;  [facility];  Laterality: Right;   ORIF ANKLE  FRACTURE Right 06/19/2017   Procedure: OPEN REDUCTION INTERNAL FIXATION (ORIF) ANKLE FRACTURE;  Surgeon: Shona Needles, MD;  Location: [place];  Service: Orthopedics;  Laterality: Right;   ORIF ELBOW FRACTURE Right 06/19/2017   Procedure: OPEN REDUCTION ELBOW DISLOCATION WITH RADIAL HEAD REPLACEMENT;  Surgeon: Shona Needles, MD;  Location: [place];  Service: Orthopedics;  Laterality: Right;   TEE WITHOUT CARDIOVERSION N/A 04/06/2015    Procedure: TRANSESOPHAGEAL ECHOCARDIOGRAM (TEE);  Surgeon: [name], MD;  Location: [place];  Service: Open Heart Surgery;  Laterality: N/A;   TONSILLECTOMY     UMBILICAL HERNIA REPAIR      Allergies  Allergies  Allergen Reactions   Oxycodone Nausea And Vomiting    History of Present Illness    Ryan Ward has a PMH of STEMI status post percutaneous coronary angioplasty, complete heart block, cardiogenic shock, hypertension, ventricular fibrillation, chronic combined systolic and diastolic CHF, PE, paroxysmal atrial fibrillation, ischemic cardiomyopathy, and sinus bradycardia.  He had inferior STEMI 12/16 and received PCI with DES to his RCA.  His procedure was complicated by cardiogenic shock, ventricular fibrillation and complete heart block.  He was noted to develop atrial fibrillation.  During the admission he developed right lower extremity DVT and right upper lobe PE.  He was placed on Xarelto and completed a course of anticoagulation.  He underwent repeat cardiac catheterization 3/16 which showed patent RCA stent with residual distal RPDA disease and proximal mid LAD disease as well as left circumflex disease.  He underwent CABG x4 on 04/06/2015.  His CABG was complicated by atrial fibrillation which was successfully treated with amiodarone therapy.  Due to significant bradycardia on follow-up metoprolol and amiodarone were discontinued.  His echocardiogram 7/17 showed an EF of 35-40%.  His ICD was deferred.  He wore a cardiac event monitor which did not show recurrence of atrial fibrillation.  He was noted to have osteomyelitis of his right hallux and underwent amputation February.  He was seen in follow-up by Dr. Martinique 12/08/2020.  During that time he continued to do well from a cardiac standpoint.  He denied chest pain dyspnea.  He is not very active but was able to go up the stairs and do yard work without difficulty.  He denied dizziness.  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  There were days when he admitted to skipping his furosemide due to not being able to use the restroom frequently.  He was transitioned to [place], spironolactone and placed on Jardiance.  He presented to the clinic 02/16/2021 for follow-up evaluation stated he had noticed that since starting spironolactone, Jardiance, and Entresto he was having more throat clearing.  He was previously intolerant of lisinopril.  He also noted that the medication was very expensive costing him around $395 per month.  I  gave him the patient assistance paperwork so that he may fill it out.  He reported that he had been more physically active and noticing more lightheadedness/dizziness.  We reviewed his symptoms prior to starting medications and he noted slight dizziness prior to his new medications.   He had not been checking his blood pressure at home.  He did report some dietary discretion with regards to sugar.  I decreased his furosemide and potassium to every other day, ordered a BMP, had him continue to weigh himself daily, and planned follow-up for 2 to 3 months.  Presents to the clinic today for follow-up evaluation states he feels well.  He started a new  job.  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  He projects that there will be around 2200 employees and reports that they have just increased the finding for the plant from 4 billion to 8.  He does not have any trouble working to 8-hour days.  He is tolerating his medication well.  I will repeat his echocardiogram and plan follow-up for 4 to 6 months.  Today he denies chest pain, shortness of breath, lower extremity edema, fatigue, palpitations, melena, hematuria, hemoptysis, diaphoresis, weakness, presyncope, syncope, orthopnea, and PND.   Home Medications    Prior to Admission medications   Medication Sig Start Date End Date Taking? Authorizing Provider  aspirin EC 81 MG tablet Take 81  mg by mouth daily.    [provider]  atorvastatin (LIPITOR) 80 MG tablet Take 1 tablet (80 mg total) by mouth daily. Advised patient to schedule OV for further refills 10/06/20   Ledora Bottcher, PA  Cholecalciferol (VITAMIN D3) 5000 UNITS TABS Take 5,000 Units by mouth daily.     [provider]  docusate sodium (COLACE) 100 MG capsule Take 1 capsule (100 mg total) by mouth 2 (two) times daily. While taking narcotic pain medicine. 02/25/20   Corky Sing, PA-C  empagliflozin (JARDIANCE) 10 MG TABS tablet Take 1 tablet (10 mg total) by mouth daily before breakfast. 12/27/20   Martinique, Peter M, MD  famotidine (PEPCID) 20 MG tablet Take 20 mg by mouth daily as needed for heartburn or indigestion.    [provider]  furosemide (LASIX) 40 MG tablet Take 1 tablet (40 mg total) by mouth daily. 12/08/20   Martinique, Peter M, MD  KLOR-CON M20 20 MEQ tablet TAKE 2 TABLETS BY MOUTH DAILY Patient taking differently: Take 20 mEq by mouth 2 (two) times daily. 10/07/19   Almyra Deforest, PA  levothyroxine (SYNTHROID) 50 MCG tablet TAKE 1 TABLET BY MOUTH TWICE A DAY Patient taking differently: Take 50 mcg by mouth 2 (two) times daily. 10/07/19   Almyra Deforest, PA  Multiple Vitamin (MULTIVITAMIN WITH MINERALS) TABS tablet Take 1 tablet by mouth daily.    [provider]  ondansetron (ZOFRAN) 4 MG tablet Take 1 tablet (4 mg total) by mouth daily as needed for nausea or vomiting. 02/25/20   Corky Sing, PA-C  PROAIR HFA 108 [phone] Base) MCG/ACT inhaler Inhale 2 puffs into the lungs every 6 (six) hours as needed for wheezing.  02/25/15   [provider]  sacubitril-valsartan (ENTRESTO) 24-26 MG Take 1 tablet by mouth 2 (two) times daily. 12/27/20   Martinique, Peter M, MD  senna (SENOKOT) 8.6 MG TABS tablet Take 2 tablets (17.2 mg total) by mouth 2 (two) times daily. Patient not taking: Reported on 12/08/2020 02/25/20   Corky Sing, PA-C  spironolactone (ALDACTONE) 25 MG  tablet Take 1 tablet (25 mg total) by mouth daily. 12/27/20   Martinique, Peter M, MD  traMADol (ULTRAM) 50 MG tablet tramadol 50 mg tablet  TAKE 1 TABLET (50 MG TOTAL) BY MOUTH EVERY 6 (SIX) HOURS AS NEEDED FOR UP TO 3 DAYS.    [provider]    Family History    Family History  Problem Relation Age of Onset   Heart attack Father    He indicated that his mother is deceased. He indicated that his father is deceased.  Social History    Social History   Socioeconomic History  Marital status: Widowed    Spouse name: Not on file   Number of children: Not on file   Years of education: Not on file   Highest education level: Not on file  Occupational History   Not on file  Tobacco Use   Smoking status: Never   Smokeless tobacco: Never  Vaping Use   Vaping Use: Never used  Substance and Sexual Activity   Alcohol use: Not Currently    Alcohol/week: 0.0 standard drinks of alcohol   Drug use: Never   Sexual activity: Yes  Other Topics Concern   Not on file  Social History Narrative   Works with [employer]   Social Determinants of Health   Financial Resource Strain: Not on file  Food Insecurity: Not on file  Transportation Needs: Not on file  Physical Activity: Not on file  Stress: Not on file  Social Connections: Not on file  Intimate Partner Violence: Not on file     Review of Systems    General:  No chills, fever, night sweats or weight changes.  Cardiovascular:  No chest pain, dyspnea on exertion, edema, orthopnea, palpitations, paroxysmal nocturnal dyspnea. Dermatological: No rash, lesions/masses Respiratory: No cough, dyspnea Urologic: No hematuria, dysuria Abdominal:   No nausea, vomiting, diarrhea, bright red blood per rectum, melena, or hematemesis Neurologic:  No visual changes, wkns, changes in mental status. All other systems reviewed and are otherwise negative except as noted above.  Physical Exam    VS:  BP 100/60   Pulse 98   Ht '5\' 8"'$   (1.727 m)   Wt 208 lb 9.6 oz (94.6 kg)   SpO2 99%   BMI 31.72 kg/m  , BMI Body mass index is 31.72 kg/m. GEN: Well nourished, well developed, in no acute distress. HEENT: normal. Neck: Supple, no JVD, carotid bruits, or masses. Cardiac: RRR, no murmurs, rubs, or gallops. No clubbing, cyanosis, edema.  Radials/DP/PT 2+ and equal bilaterally.  Respiratory:  Respirations regular and unlabored, clear to auscultation bilaterally. GI: Soft, nontender, nondistended, BS + x 4. MS: no deformity or atrophy. Skin: warm and dry, no rash. Neuro:  Strength and sensation are intact. Psych: Normal affect.  Accessory Clinical Findings    Recent Labs: 11/18/2020: ALT 27 02/16/2021: BUN 26; Creatinine, Ser 1.33; Potassium 4.8; Sodium 139   Recent Lipid Panel    Component Value Date/Time   CHOL 125 11/18/2020 1017   TRIG 55 11/18/2020 1017   HDL 45 11/18/2020 1017   CHOLHDL 2.8 11/18/2020 1017   CHOLHDL 2.3 10/10/2015 1428   VLDL 9 10/10/2015 1428   LDLCALC 68 11/18/2020 1017    ECG personally reviewed by me today-none today.  Assessment & Plan   1.  Chronic systolic CHF-continues to be euvolemic today.  NYHA class II.    Previously switched from losartan to [place], [place], and spironolactone.  Not a good candidate for beta-blocker given history of bradycardia.  Previously reported that he was not sure if he is tolerating Entresto due to increased throat clearing.  Allergy to lisinopril.  Tolerated losartan well.   Lightheadedness resolved. Continue Entresto, spironolactone, Jardiance,  Continue furosemide, potassium to every other day Repeat echocardiogram   Essential hypertension-BP today 106/60.  Well-controlled at home. Continue spironolactone, Entresto Heart healthy low-sodium diet-salty 6 given Increase physical activity as tolerated  Hyperlipidemia-11/18/2020: Cholesterol, Total 125; HDL 45; LDL Chol Calc (NIH) 68; Triglycerides 55 Continue aspirin, atorvastatin Heart  healthy low-sodium diet-salty 6 given Increase physical activity as tolerated Repeat fasting lipids  and ALT disease 11/23  Coronary artery disease-no chest pain today.  Underwent CABG x4 04/06/2015.   Continue atorvastatin, aspirin, Heart healthy low-sodium diet-salty 6 given Increase physical activity as tolerated   Disposition: Follow-up with Dr. Martinique or me in 4-6 month.  Jossie Ng. Mikael Skoda NP-C    06/14/2021, 1:48 PM [facility] [address] Office [phone] Fax [phone]  Notice: This dictation was prepared with Dragon dictation along with smaller phrase technology. Any transcriptional errors that result from this process are unintentional and may not be corrected upon review.  I spent 14 minutes examining this patient, reviewing medications, and using patient centered shared decision making involving her cardiac care.  Prior to her visit I spent greater than 20 minutes reviewing her past medical history,  medications, and prior cardiac tests.

## 2021-06-14 ENCOUNTER — Encounter: Payer: Self-pay | Admitting: General Practice

## 2021-06-14 ENCOUNTER — Ambulatory Visit (INDEPENDENT_AMBULATORY_CARE_PROVIDER_SITE_OTHER): Payer: Medicare Other | Admitting: General Practice

## 2021-06-14 VITALS — BP 100/60 | HR 98 | Ht 68.0 in | Wt 208.6 lb

## 2021-06-14 DIAGNOSIS — I10 Essential (primary) hypertension: Secondary | ICD-10-CM | POA: Diagnosis not present

## 2021-06-14 DIAGNOSIS — I25810 Atherosclerosis of coronary artery bypass graft(s) without angina pectoris: Secondary | ICD-10-CM | POA: Diagnosis not present

## 2021-06-14 DIAGNOSIS — I5022 Chronic systolic (congestive) heart failure: Secondary | ICD-10-CM

## 2021-06-14 DIAGNOSIS — E785 Hyperlipidemia, unspecified: Secondary | ICD-10-CM

## 2021-06-14 NOTE — Patient Instructions (Signed)
Medication Instructions:  The current medical regimen is effective;  continue present plan and medications as directed. Please refer to the Current Medication list given to you today.   *If you need a refill on your cardiac medications before your next appointment, please call your pharmacy*  Lab Work:    NONE     If you have labs (blood work) drawn today and your tests are completely normal, you will receive your results only by: [place] (if you have MyChart) OR  A paper copy in the mail If you have any lab test that is abnormal or we need to change your treatment, we will call you to review the results.  Testing/Procedures:  Echocardiogram - Your physician has requested that you have an echocardiogram. Echocardiography is a painless test that uses sound waves to create images of your heart. It provides your doctor with information about the size and shape of your heart and how well your heart's chambers and valves are working. This procedure takes approximately one hour. There are no restrictions for this procedure.   Follow-Up: Your next appointment:  6 month(s) In Person with Peter Martinique, MD     Please call our office 2 months in advance to schedule this appointment   At [facility], you and your health needs are our priority.  As part of our continuing mission to provide you with exceptional heart care, we have created designated Provider Care Teams.  These Care Teams include your primary Cardiologist (physician) and Advanced Practice Providers (APPs -  Physician Assistants and Nurse Practitioners) who all work together to provide you with the care you need, when you need it.  Important Information About Sugar

## 2021-07-06 ENCOUNTER — Ambulatory Visit (HOSPITAL_COMMUNITY): Payer: Medicare Other | Attending: Cardiology

## 2021-07-06 DIAGNOSIS — I5022 Chronic systolic (congestive) heart failure: Secondary | ICD-10-CM | POA: Insufficient documentation

## 2021-07-06 LAB — ECHOCARDIOGRAM COMPLETE
Calc EF: 31.4 %
S' Lateral: 4.9 cm
Single Plane A2C EF: 27.5 %
Single Plane A4C EF: 33.4 %

## 2021-08-15 ENCOUNTER — Other Ambulatory Visit: Payer: Self-pay | Admitting: Cardiology

## 2021-08-24 NOTE — Progress Notes (Deleted)
Cardiology Office Note:    Date:  08/24/2021   ID:  Ryan Ward, DOB 1945-10-16, MRN [id number]  PCP:  Leonard Downing, MD  [place] Cardiologist:  Stran Raper Martinique, MD  [place] Electrophysiologist:  None   Referring MD: Leonard Downing, *   No chief complaint on file.    History of Present Illness:    Ryan Ward is a 76 y.o. male with a hx of CAD s/p CABG, chronic systolic HF, h/o RLE DVT/PE, HTN, HLD, OSA and PAF.  Patient had a history of inferior ST elevated MI in December 2016.  Cardiac catheterization showed occluded RCA and severe LAD and circumflex disease.  RCA was successfully treated with drug-eluting stent.  Periprocedural course complicated by cardiogenic shock, ventricular fibrillation and a complete heart block.  He also developed atrial fibrillation.  During the same admission, he developed right lower extremity DVT and the right upper lobe pulmonary emboli.  He was placed on Xarelto and completed the course of anticoagulation.  He underwent relook cardiac catheterization in March 2016 which showed patency of the RCA stent with a residual distal RPDA disease as well as proximal to mid LAD disease and also left circumflex disease.  He underwent successful CABG x4 (LIMA to LAD, SVG to diagonal, sequential SVG to proximal RPDA and the mid RPDA).  Postop course was complicated by atrial fibrillation that was treated successfully with amiodarone therapy.  After discharge, he developed heart failure and was treated with increased dose of Lasix.  Due to significant bradycardia on follow-up, metoprolol and amiodarone were discontinued.  Follow-up echo in July 2017 showed EF improved to 35 to 40%.  ICD was deferred.  Event monitor did not show any recurrence of atrial fibrillation as well.  In Feb 2022 he had osteomyelitis of the right hallux and underwent amputation.  When seen in Dec 2022 he was feeling well. No cardiac complaints. Repeat Echo showed further  decline in EF to 25-30%. Heart failure medications adjusted with switch to Entresto. Adding Jardiance and adding aldactone. Was seen by Coletta Memos NP in Feb and June. Tolerating medications well. Repeat Echo recently done showing no improvement with EF still 25-30%. ? Cath.d    Past Medical History:  Diagnosis Date   Asthma    VERY MILD   CAD (coronary artery disease)    a. 12/07/14 Inf STEMI/PCI: LAD 85p/m, 70md, LCX 80ost, RCA 30p/m, 482m, 100d (4.0x20 Promus Premier DES). Hosp course complicated by VF arrest, CGS, CHB req temp wire; b. 03/2015 Cath: LAD 85p/m, 95d, LCx 80ost, RCA 30-40p/m/d, RPDA 75, EF 30-35%, Nl CO; c. 04/06/2015 CABGx4 (LIMA->LAD, VG->Diag, Seq VG->prox RPDA->mid RPDA.   Chronic systolic CHF (congestive heart failure) ([facility]   a. 03/2015 Echo: EF 30-35%, diff HK.   DVT (deep venous thrombosis) ([place]12/2016   RLE   History of blood transfusion 04/2013   S/P CABG   Hyperlipidemia    Hypertension    Hypertensive heart disease    Ischemic cardiomyopathy    a. 03/2015 Echo: EF 30-35%, diff HK, mild MR, mod dil RA, PASP 4975m.   Myocardial infarction ([facility]2/2016   OSA (obstructive sleep apnea)    a. not using CPAP at home, does have machine (06/20/2017)   Paroxysmal atrial fibrillation ([place]  a. 12/2014 In setting of Inf MI->convertred spont.   Pulmonary embolism ([facility]CR[facility]  a. 12/2014 R PT and peroneal vein DVT and RUL PE-->Xarelto x 3 mos.    Past  Surgical History:  Procedure Laterality Date   CARDIAC CATHETERIZATION N/A 12/07/2014   Procedure: Coronary Stent Intervention;  Surgeon: Marlies Ligman M Martinique, MD;  Location: [facility];  Service: Cardiovascular;  Laterality: N/A;  STEMI   CARDIAC CATHETERIZATION N/A 12/07/2014   Procedure: IABP Insertion;  Surgeon: Cionna Collantes M Martinique, MD;  Location: [facility];  Service: Cardiovascular;  Laterality: N/A;   CARDIAC CATHETERIZATION N/A 12/07/2014   Procedure: Temporary Pacemaker;  Surgeon: Silvie Obremski M Martinique, MD;   Location: [facility] [facility];  Service: Cardiovascular;  Laterality: N/A;   CARDIAC CATHETERIZATION N/A 12/07/2014   Procedure: Left Heart Cath and Coronary Angiography;  Surgeon: Boyd Litaker M Martinique, MD;  Location: [facility];  Service: Cardiovascular;  Laterality: N/A;   CARDIAC CATHETERIZATION N/A 03/29/2015   Procedure: Right/Left Heart Cath and Coronary Angiography;  Surgeon: Brentin Shin M Martinique, MD;  Location: [facility];  Service: Cardiovascular;  Laterality: N/A;   CORONARY ARTERY BYPASS GRAFT N/A 04/06/2015   Procedure: CORONARY ARTERY BYPASS GRAFT TIMES FOUR  USING LEFT INTERNAL MAMMARY ARTERY TO THE LEFT ANTERIOR DESCENDING, BILATERAL GREATER SAPHENOUS ENDOVEIN HARVEST GRAFT TO PROXIMAL AND MID POSTERIOR DESCENDING AND DIAGONAL CORONARY ARTERIES AND PLACEMENT OR RIGHT FEMORAL A-LINE. ;  Surgeon: [name], MD;  Location: [facility];  Service: Open Heart Surgery;  Laterality: N/A;   FRACTURE SURGERY     HERNIA REPAIR     I & D EXTREMITY Right 02/25/2020   Procedure: Right hallux ulcer irrigation and excisional debridement amputation;  Surgeon: Wylene Simmer, MD;  Location: [facility];  Service: Orthopedics;  Laterality: Right;  66mn   OPEN REDUCTION INTERNAL FIXATION (ORIF) PROXIMAL PHALANX Right 06/19/2017   Procedure: OPEN REDUCTION INTERNAL FIXATION (ORIF) PROXIMAL PHALANX;  Surgeon: HShona Needles MD;  [place]  Service: Orthopedics;  Laterality: Right;   ORIF ANKLE FRACTURE Right 06/19/2017   Procedure: OPEN REDUCTION INTERNAL FIXATION (ORIF) ANKLE FRACTURE;  Surgeon: HShona Needles MD;  [place] [place]  Service: Orthopedics;  Laterality: Right;   ORIF ELBOW FRACTURE Right 06/19/2017   Procedure: OPEN REDUCTION ELBOW DISLOCATION WITH RADIAL HEAD REPLACEMENT;  Surgeon: HShona Needles MD;  [place]  Service: Orthopedics;  Laterality: Right;   TEE WITHOUT CARDIOVERSION N/A 04/06/2015   Procedure: TRANSESOPHAGEAL ECHOCARDIOGRAM (TEE);  Surgeon: EGrace Isaac MD;   [place]  Service: Open Heart Surgery;  Laterality: N/A;   TONSILLECTOMY     UMBILICAL HERNIA REPAIR      Current Medications: No outpatient medications have been marked as taking for the 09/01/21 encounter (Appointment) with JMartinique Aundra Pung M, MD.     Allergies:   Oxycodone   Social History   Socioeconomic History   Marital status: Widowed    Spouse name: Not on file   Number of children: Not on file   Years of education: Not on file   Highest education level: Not on file  Occupational History   Not on file  Tobacco Use   Smoking status: Never   Smokeless tobacco: Never  Vaping Use   Vaping Use: Never used  Substance and Sexual Activity   Alcohol use: Not Currently    Alcohol/week: 0.0 standard drinks of alcohol   Drug use: Never   Sexual activity: Yes  Other Topics Concern   Not on file  Social History Narrative   Works with [employer]  Social Determinants of Health   Financial Resource Strain: Not on file  Food Insecurity:  Not on file  Transportation Needs: Not on file  Physical Activity: Not on file  Stress: Not on file  Social Connections: Not on file     Family History: The patient's family history includes Heart attack in his father.  ROS:   Please see the history of present illness.     All other systems reviewed and are negative.  EKGs/Labs/Other Studies Reviewed:    The following studies were reviewed today:  Echo 07/18/2015 LV EF: 35% -   40%  Study Conclusions   - Left ventricle: The cavity size was mildly dilated. Systolic    function was moderately reduced. The estimated ejection fraction    was in the range of 35% to 40%. Diffuse hypokinesis. There is    akinesis of the entireinferolateral and inferior myocardium.    Features are consistent with a pseudonormal left ventricular    filling pattern, with concomitant abnormal relaxation and    increased filling pressure (grade 2 diastolic dysfunction).    Doppler parameters are  consistent with high ventricular filling    pressure.  - Aortic valve: Moderate diffuse thickening and calcification.    There was trivial regurgitation.  - Mitral valve: There was mild regurgitation.  - Left atrium: The atrium was mildly dilated.  - Right atrium: The atrium was mildly dilated.  - Tricuspid valve: There was trivial regurgitation.  - Pulmonary arteries: PA peak pressure: 31 mm Hg (S).   Echo 07/06/21: IMPRESSIONS     1. Left ventricular ejection fraction, by estimation, is 25 to 30%. The  left ventricle has severely decreased function. The left ventricle  demonstrates global hypokinesis. The left ventricular internal cavity size  was mildly dilated. Left ventricular  diastolic function could not be evaluated. There is dyskinesis of the left  ventricular, mid-apical inferior wall. There is hypokinesis of the left  ventricular, basal-mid inferior wall.   2. Right ventricular systolic function is mildly reduced. The right  ventricular size is moderately enlarged. There is normal pulmonary artery  systolic pressure.   3. Left atrial size was moderately dilated.   4. Right atrial size was moderately dilated.   5. The mitral valve is normal in structure. Mild mitral valve  regurgitation. No evidence of mitral stenosis.   6. The aortic valve is grossly normal. There is mild calcification of the  aortic valve. There is mild thickening of the aortic valve. Aortic valve  regurgitation is not visualized.   7. The inferior vena cava is normal in size with greater than 50%  respiratory variability, suggesting right atrial pressure of 3 mmHg.   Comparison(s): Changes from prior study are noted. While overall EF  appears similar, there are focal hypokinetic/dyskinetic segments in the  inferior wall that are now apparent compared to prior.   EKG:  EKG is ordered today.  The ekg ordered today demonstrates normal sinus rhythm, rate 61, old inferior infarct, poor R wave progression in  the anterior leads.  Recent Labs: 11/18/2020: ALT 27 02/16/2021: BUN 26; Creatinine, Ser 1.33; Potassium 4.8; Sodium 139  Recent Lipid Panel    Component Value Date/Time   CHOL 125 11/18/2020 1017   TRIG 55 11/18/2020 1017   HDL 45 11/18/2020 1017   CHOLHDL 2.8 11/18/2020 1017   CHOLHDL 2.3 10/10/2015 1428   VLDL 9 10/10/2015 1428   LDLCALC 68 11/18/2020 1017    Physical Exam:    VS:  There were no vitals taken for this visit.    Wt Readings from Last  3 Encounters:  06/14/21 208 lb 9.6 oz (94.6 kg)  02/16/21 210 lb 3.2 oz (95.3 kg)  12/08/20 214 lb 12.8 oz (97.4 kg)     GEN:  Well nourished, well developed in no acute distress HEENT: Normal NECK: No JVD; No carotid bruits LYMPHATICS: No lymphadenopathy CARDIAC: RRR, no murmurs, rubs, gallops RESPIRATORY:  Clear to auscultation without rales, wheezing or rhonchi  ABDOMEN: Soft, non-tender, non-distended MUSCULOSKELETAL:  No edema; No deformity  SKIN: Warm and dry NEUROLOGIC:  Alert and oriented x 3 PSYCHIATRIC:  Normal affect    ASSESSMENT:    No diagnosis found.   PLAN:    In order of problems listed above:  CAD s/p CABG: Continue aspirin and statin.  He has no significant anginal symptoms.  Chronic systolic heart failure: Euvolemic on physical exam. I would like to update Echo. If he still has reduced EF we should consider switching losartan to Entresto and adding an SGLT 2 inhibitor +/- aldactone to optimize therapy. Doubt he is a candidate for beta blocker given history of bradycardia. ? Cardiac cath  Hypertension: Blood pressure controlled  Hyperlipidemia: Continue Lipitor. LDL 68 at goal  PAF: Occurred in the postop setting.  No recurrence.    History of DVT: Completed a course of anticoagulation therapy. 7.   Hypothyroidism - unusual that he takes his synthroid BID instead of a daily dose. Should follow up with Dr Arelia Sneddon.    Medication Adjustments/Labs and Tests Ordered: Current medicines are  reviewed at length with the patient today.  Concerns regarding medicines are outlined above.  No orders of the defined types were placed in this encounter.   No orders of the defined types were placed in this encounter.    There are no Patient Instructions on file for this visit.   Signed, Lameka Disla Martinique, MD  08/24/2021 11:44 AM    [facility]

## 2021-09-01 ENCOUNTER — Ambulatory Visit: Payer: Medicare Other | Admitting: Cardiology

## 2021-09-12 ENCOUNTER — Other Ambulatory Visit: Payer: Self-pay | Admitting: Cardiology

## 2021-09-19 NOTE — Progress Notes (Deleted)
Cardiology Office Note:    Date:  09/19/2021   ID:  Ryan Ward, DOB 1945-05-07, MRN [id number]  PCP:  Leonard Downing, [facility] Cardiologist:  Peter Martinique, MD { Click to update primary MD,subspecialty MD or APP then REFRESH:1}    Referring MD: Leonard Downing, *   No chief complaint on file. ***  History of Present Illness:    Ryan Ward is a 76 y.o. male with a hx of ***  Past Medical History:  Diagnosis Date   Asthma    VERY MILD   CAD (coronary artery disease)    a. 12/07/14 Inf STEMI/PCI: LAD 85p/m, 63md, LCX 80ost, RCA 30p/m, 459m, 100d (4.0x20 Promus Premier DES). Hosp course complicated by VF arrest, CGS, CHB req temp wire; b. 03/2015 Cath: LAD 85p/m, 95d, LCx 80ost, RCA 30-40p/m/d, RPDA 75, EF 30-35%, Nl CO; c. 04/06/2015 CABGx4 (LIMA->LAD, VG->Diag, Seq VG->prox RPDA->mid RPDA.   Chronic systolic CHF (congestive heart failure) ([facility]   a. 03/2015 Echo: EF 30-35%, diff HK.   DVT (deep venous thrombosis) (HCJ[place]12/2016   RLE   History of blood transfusion 04/2013   S/P CABG   Hyperlipidemia    Hypertension    Hypertensive heart disease    Ischemic cardiomyopathy    a. 03/2015 Echo: EF 30-35%, diff HK, mild MR, mod dil RA, PASP 4947m.   Myocardial infarction ([facility]CC[facility] AF[facility]2/2016   OSA (obstructive sleep apnea)    a. not using CPAP at home, does have machine (06/20/2017)   Paroxysmal atrial fibrillation ([facility]CC[place]  a. 12/2014 In setting of Inf MI->convertred spont.   Pulmonary embolism ([facility]CC[place] [place]  a. 12/2014 R PT and peroneal vein DVT and RUL PE-->Xarelto x 3 mos.    Past Surgical History:  Procedure Laterality Date   CARDIAC CATHETERIZATION N/A 12/07/2014   Procedure: Coronary Stent Intervention;  Surgeon: Peter M JorMartiniqueD;  [facility];  Service: Cardiovascular;  Laterality: N/A;  STEMI   CARDIAC CATHETERIZATION N/A 12/07/2014   Procedure: IABP Insertion;  Surgeon: Peter M JorMartiniqueD;  [facility];  Service: Cardiovascular;  Laterality: N/A;   CARDIAC CATHETERIZATION N/A 12/07/2014   Procedure: Temporary Pacemaker;  Surgeon: Peter M JorMartiniqueD;  [facility];  Service: Cardiovascular;  Laterality: N/A;   CARDIAC CATHETERIZATION N/A 12/07/2014   Procedure: Left Heart Cath and Coronary Angiography;  Surgeon: Peter M JorMartiniqueD;  [facility];  Service: Cardiovascular;  Laterality: N/A;   CARDIAC CATHETERIZATION N/A 03/29/2015   Procedure: Right/Left Heart Cath and Coronary Angiography;  Surgeon: Peter M JorMartiniqueD;  [facility];  Service: Cardiovascular;  Laterality: N/A;   CORONARY ARTERY BYPASS GRAFT N/A 04/06/2015   Procedure: CORONARY ARTERY BYPASS GRAFT TIMES FOUR  USING LEFT INTERNAL MAMMARY ARTERY TO THE LEFT ANTERIOR DESCENDING, BILATERAL GREATER SAPHENOUS ENDOVEIN HARVEST GRAFT TO PROXIMAL AND MID POSTERIOR DESCENDING AND DIAGONAL CORONARY ARTERIES AND PLACEMENT OR RIGHT FEMORAL A-LINE. ;  Surgeon: EdwGrace IsaacD;  [facility];  Laterality: N/A;   FRACTURE SURGERY     HERNIA REPAIR     I & D EXTREMITY Right 02/25/2020   Procedure: Right hallux ulcer irrigation and excisional debridement amputation;  Surgeon: HewWylene SimmerD;  [facility];  Laterality: Right;  48m76m OPEN REDUCTION INTERNAL FIXATION (ORIF) PROXIMAL PHALANX Right 06/19/2017   Procedure:  OPEN REDUCTION INTERNAL FIXATION (ORIF) PROXIMAL PHALANX;  Surgeon: Shona Needles, MD;  Location: [facility];  Service: Orthopedics;  Laterality: Right;   ORIF ANKLE FRACTURE Right 06/19/2017   Procedure: OPEN REDUCTION INTERNAL FIXATION (ORIF) ANKLE FRACTURE;  Surgeon: Shona Needles, MD;  Location: [place];  Service: Orthopedics;  Laterality: Right;   ORIF ELBOW FRACTURE Right 06/19/2017   Procedure: OPEN REDUCTION ELBOW DISLOCATION WITH RADIAL HEAD REPLACEMENT;  Surgeon: Shona Needles, MD;  Location: [facility];  Service:  Orthopedics;  Laterality: Right;   TEE WITHOUT CARDIOVERSION N/A 04/06/2015   Procedure: TRANSESOPHAGEAL ECHOCARDIOGRAM (TEE);  Surgeon: [name], MD;  Location: [facility];  Service: Open Heart Surgery;  Laterality: N/A;   TONSILLECTOMY     UMBILICAL HERNIA REPAIR      Current Medications: No outpatient medications have been marked as taking for the 09/20/21 encounter (Appointment) with Ledora Bottcher, [place].     Allergies:   Oxycodone   Social History   Socioeconomic History   Marital status: Widowed    Spouse name: Not on file   Number of children: Not on file   Years of education: Not on file   Highest education level: Not on file  Occupational History   Not on file  Tobacco Use   Smoking status: Never   Smokeless tobacco: Never  Vaping Use   Vaping Use: Never used  Substance and Sexual Activity   Alcohol use: Not Currently    Alcohol/week: 0.0 standard drinks of alcohol   Drug use: Never   Sexual activity: Yes  Other Topics Concern   Not on file  Social History Narrative   Works with [employer]   Social Determinants of Health   Financial Resource Strain: Not on file  Food Insecurity: Not on file  Transportation Needs: Not on file  Physical Activity: Not on file  Stress: Not on file  Social Connections: Not on file     Family History: The patient's ***family history includes Heart attack in his father.  ROS:   Please see the history of present illness.    *** All other systems reviewed and are negative.  EKGs/Labs/Other Studies Reviewed:    The following studies were reviewed today: ***  EKG:  EKG is *** ordered today.  The ekg ordered today demonstrates ***  Recent Labs: 11/18/2020: ALT 27 02/16/2021: BUN 26; Creatinine, Ser 1.33; Potassium 4.8; Sodium 139  Recent Lipid Panel    Component Value Date/Time   CHOL 125 11/18/2020 1017   TRIG 55 11/18/2020 1017   HDL 45 11/18/2020 1017   CHOLHDL 2.8 11/18/2020 1017   CHOLHDL 2.3 10/10/2015  1428   VLDL 9 10/10/2015 1428   LDLCALC 68 11/18/2020 1017     Risk Assessment/Calculations:   {Does this patient have ATRIAL FIBRILLATION?:[phone]}  No BP recorded.  {Refresh Note OR Click here to enter BP  :1}***         Physical Exam:    VS:  There were no vitals taken for this visit.    Wt Readings from Last 3 Encounters:  06/14/21 208 lb 9.6 oz (94.6 kg)  02/16/21 210 lb 3.2 oz (95.3 kg)  12/08/20 214 lb 12.8 oz (97.4 kg)     GEN: *** Well nourished, well developed in no acute distress HEENT: Normal NECK: No JVD; No carotid bruits LYMPHATICS: No lymphadenopathy CARDIAC: ***RRR, no murmurs, rubs, gallops RESPIRATORY:  Clear to auscultation without rales, wheezing or rhonchi  ABDOMEN: Soft, non-tender, non-distended MUSCULOSKELETAL:  No edema; No deformity  SKIN: Warm and dry NEUROLOGIC:  Alert and oriented x 3 PSYCHIATRIC:  Normal affect   ASSESSMENT:    No diagnosis found. PLAN:    In order of problems listed above:  ***      {Are you ordering a CV Procedure (e.g. stress test, cath, DCCV, TEE, etc)?   Press F2        :[id number]}    Medication Adjustments/Labs and Tests Ordered: Current medicines are reviewed at length with the patient today.  Concerns regarding medicines are outlined above.  No orders of the defined types were placed in this encounter.  No orders of the defined types were placed in this encounter.   There are no Patient Instructions on file for this visit.   Signed, Ledora Bottcher, [place]  09/19/2021 3:47 PM    [facility]

## 2021-09-20 ENCOUNTER — Ambulatory Visit: Payer: Medicare Other | Admitting: Physician Assistant

## 2021-10-02 ENCOUNTER — Ambulatory Visit: Payer: Medicare Other | Admitting: Physician Assistant

## 2021-11-30 NOTE — Progress Notes (Deleted)
Cardiology Office Note:    Date:  12/08/2020   ID:  Ryan Ward, DOB 19-Aug-1945, MRN [id number]  PCP:  Leonard Downing, MD  [facility] Cardiologist:  Deavion Strider [place], MD  [facility] Electrophysiologist:  None   Referring MD: Leonard Downing, *   Chief Complaint  Patient presents with   Congestive Heart Failure   Coronary Artery Disease     History of Present Illness:    Ryan Ward is a 76 y.o. male with a hx of CAD s/p CABG, chronic systolic HF, h/o RLE DVT/PE, HTN, HLD, OSA and PAF.  Patient had a history of inferior ST elevated MI in December 2016.  Cardiac catheterization showed occluded RCA and severe LAD and circumflex disease.  RCA was successfully treated with drug-eluting stent.  Periprocedural course complicated by cardiogenic shock, ventricular fibrillation and a complete heart block.  He also developed atrial fibrillation.  During the same admission, he developed right lower extremity DVT and the right upper lobe pulmonary emboli.  He was placed on Xarelto and completed the course of anticoagulation.  He underwent relook cardiac catheterization in March 2016 which showed patency of the RCA stent with a residual distal RPDA disease as well as proximal to mid LAD disease and also left circumflex disease.  He underwent successful CABG x4 (LIMA to LAD, SVG to diagonal, sequential SVG to proximal RPDA and the mid RPDA).  Postop course was complicated by atrial fibrillation that was treated successfully with amiodarone therapy.  After discharge, he developed heart failure and was treated with increased dose of Lasix.  Due to significant bradycardia on follow-up, metoprolol and amiodarone were discontinued.  Follow-up echo in July 2017 showed EF improved to 35 to 40%.  ICD was deferred.  Event monitor did not show any recurrence of atrial fibrillation as well.  In Feb 2022 he had osteomyelitis of the right hallux and underwent amputation.  When I last saw him a year  ago Echo showed EF 25-30%. He was switched to [place] and aldactone added. Since then he saw Coletta Memos NP in June. Was tolerating medication well. Repeat Echo done in July without significant change in cardiac function. EF 25-30%.     Past Medical History:  Diagnosis Date   Asthma    VERY MILD   CAD (coronary artery disease)    a. 12/07/14 Inf STEMI/PCI: LAD 85p/m, 103md, LCX 80ost, RCA 30p/m, 485m, 100d (4.0x20 Promus Premier DES). Hosp course complicated by VF arrest, CGS, CHB req temp wire; b. 03/2015 Cath: LAD 85p/m, 95d, LCx 80ost, RCA 30-40p/m/d, RPDA 75, EF 30-35%, Nl CO; c. 04/06/2015 CABGx4 (LIMA->LAD, VG->Diag, Seq VG->prox RPDA->mid RPDA.   Chronic systolic CHF (congestive heart failure) ([facility]   a. 03/2015 Echo: EF 30-35%, diff HK.   DVT (deep venous thrombosis) ([facility]/[place]   RLE   History of blood transfusion 04/2013   S/P CABG   Hyperlipidemia    Hypertension    Hypertensive heart disease    Ischemic cardiomyopathy    a. 03/2015 Echo: EF 30-35%, diff HK, mild MR, mod dil RA, PASP 4999m.   Myocardial infarction ([facility]/2016   OSA (obstructive sleep apnea)    a. not using CPAP at home, does have machine (06/20/2017)   Paroxysmal atrial fibrillation ([facility]  a. 12/2014 In setting of Inf MI->convertred spont.   Pulmonary embolism ([facility]  a. 12/2014 R PT and peroneal vein DVT and RUL PE-->Xarelto x 3 mos.  Past Surgical History:  Procedure Laterality Date   CARDIAC CATHETERIZATION N/A 12/07/2014   Procedure: Coronary Stent Intervention;  Surgeon: Seena Ritacco M [place], MD;  Location: [place] [facility];  Service: Cardiovascular;  Laterality: N/A;  STEMI   CARDIAC CATHETERIZATION N/A 12/07/2014   Procedure: IABP Insertion;  Surgeon: Yanisa Goodgame M [place], MD;  Location: [place] [facility];  Service: Cardiovascular;  Laterality: N/A;   CARDIAC CATHETERIZATION N/A 12/07/2014   Procedure: Temporary Pacemaker;  Surgeon: Abyan Cadman M [place], MD;  Location: [place] [facility];   Service: Cardiovascular;  Laterality: N/A;   CARDIAC CATHETERIZATION N/A 12/07/2014   Procedure: Left Heart Cath and Coronary Angiography;  Surgeon: Akyah Lagrange M [place], MD;  Location: [place] [facility];  Service: Cardiovascular;  Laterality: N/A;   CARDIAC CATHETERIZATION N/A 03/29/2015   Procedure: Right/Left Heart Cath and Coronary Angiography;  Surgeon: Alissa Pharr M [place], MD;  Location: [place] [facility];  Service: Cardiovascular;  Laterality: N/A;   CORONARY ARTERY BYPASS GRAFT N/A 04/06/2015   Procedure: CORONARY ARTERY BYPASS GRAFT TIMES FOUR  USING LEFT INTERNAL MAMMARY ARTERY TO THE LEFT ANTERIOR DESCENDING, BILATERAL GREATER SAPHENOUS ENDOVEIN HARVEST GRAFT TO PROXIMAL AND MID POSTERIOR DESCENDING AND DIAGONAL CORONARY ARTERIES AND PLACEMENT OR RIGHT FEMORAL A-LINE. ;  Surgeon: [name], MD;  Location: [place];  Service: Open Heart Surgery;  Laterality: N/A;   FRACTURE SURGERY     HERNIA REPAIR     I & D EXTREMITY Right 02/25/2020   Procedure: Right hallux ulcer irrigation and excisional debridement amputation;  Surgeon: Wylene Simmer, MD;  Location: [place];  Service: Orthopedics;  Laterality: Right;  80mn   OPEN REDUCTION INTERNAL FIXATION (ORIF) PROXIMAL PHALANX Right 06/19/2017   Procedure: OPEN REDUCTION INTERNAL FIXATION (ORIF) PROXIMAL PHALANX;  Surgeon: HShona Needles MD;  [place]  Service: Orthopedics;  Laterality: Right;   ORIF ANKLE FRACTURE Right 06/19/2017   Procedure: OPEN REDUCTION INTERNAL FIXATION (ORIF) ANKLE FRACTURE;  Surgeon: HShona Needles MD;  [place]  Service: Orthopedics;  Laterality: Right;   ORIF ELBOW FRACTURE Right 06/19/2017   Procedure: OPEN REDUCTION ELBOW DISLOCATION WITH RADIAL HEAD REPLACEMENT;  Surgeon: HShona Needles MD;  [place] [facility]  Service: Orthopedics;  Laterality: Right;   TEE WITHOUT CARDIOVERSION N/A 04/06/2015   Procedure: TRANSESOPHAGEAL ECHOCARDIOGRAM (TEE);  Surgeon: EGrace Isaac MD;  [place]  Service: Open  Heart Surgery;  Laterality: N/A;   TONSILLECTOMY     UMBILICAL HERNIA REPAIR      Current Medications: Current Meds  Medication Sig   aspirin EC 81 MG tablet Take 81 mg by mouth daily.   atorvastatin (LIPITOR) 80 MG tablet Take 1 tablet (80 mg total) by mouth daily. Advised patient to schedule OV for further refills   Cholecalciferol (VITAMIN D3) 5000 UNITS TABS Take 5,000 Units by mouth daily.    docusate sodium (COLACE) 100 MG capsule Take 1 capsule (100 mg total) by mouth 2 (two) times daily. While taking narcotic pain medicine.   famotidine (PEPCID) 20 MG tablet Take 20 mg by mouth daily as needed for heartburn or indigestion.   KLOR-CON M20 20 MEQ tablet TAKE 2 TABLETS BY MOUTH DAILY (Patient taking differently: Take 20 mEq by mouth 2 (two) times daily.)   levothyroxine (SYNTHROID) 50 MCG tablet TAKE 1 TABLET BY MOUTH TWICE A DAY (Patient taking differently: Take 50 mcg by mouth 2 (two) times daily.)   losartan (COZAAR) 25 MG tablet TAKE 1 TABLET BY MOUTH EVERY  DAY   Multiple Vitamin (MULTIVITAMIN WITH MINERALS) TABS tablet Take 1 tablet by mouth daily.   ondansetron (ZOFRAN) 4 MG tablet Take 1 tablet (4 mg total) by mouth daily as needed for nausea or vomiting.   PROAIR HFA 108 (90 Base) MCG/ACT inhaler Inhale 2 puffs into the lungs every 6 (six) hours as needed for wheezing.    traMADol (ULTRAM) 50 MG tablet tramadol 50 mg tablet  TAKE 1 TABLET (50 MG TOTAL) BY MOUTH EVERY 6 (SIX) HOURS AS NEEDED FOR UP TO 3 DAYS.   [DISCONTINUED] furosemide (LASIX) 40 MG tablet TAKE 1 TABLET BY MOUTH EVERY DAY (Patient taking differently: Take 40 mg by mouth daily.)     Allergies:   Oxycodone   Social History   Socioeconomic History   Marital status: Widowed    Spouse name: Not on file   Number of children: Not on file   Years of education: Not on file   Highest education level: Not on file  Occupational History   Not on file  Tobacco Use   Smoking status: Never   Smokeless tobacco:  Never  Vaping Use   Vaping Use: Never used  Substance and Sexual Activity   Alcohol use: Not Currently    Alcohol/week: 0.0 standard drinks   Drug use: Never   Sexual activity: Yes  Other Topics Concern   Not on file  Social History Narrative   Works with [employer]   Social Determinants of Health   Financial Resource Strain: Not on file  Food Insecurity: Not on file  Transportation Needs: Not on file  Physical Activity: Not on file  Stress: Not on file  Social Connections: Not on file     Family History: The patient's family history includes Heart attack in his father.  ROS:   Please see the history of present illness.     All other systems reviewed and are negative.  EKGs/Labs/Other Studies Reviewed:    The following studies were reviewed today:  Echo 07/18/2015 LV EF: 35% -   40%  Study Conclusions   - Left ventricle: The cavity size was mildly dilated. Systolic    function was moderately reduced. The estimated ejection fraction    was in the range of 35% to 40%. Diffuse hypokinesis. There is    akinesis of the entireinferolateral and inferior myocardium.    Features are consistent with a pseudonormal left ventricular    filling pattern, with concomitant abnormal relaxation and    increased filling pressure (grade 2 diastolic dysfunction).    Doppler parameters are consistent with high ventricular filling    pressure.  - Aortic valve: Moderate diffuse thickening and calcification.    There was trivial regurgitation.  - Mitral valve: There was mild regurgitation.  - Left atrium: The atrium was mildly dilated.  - Right atrium: The atrium was mildly dilated.  - Tricuspid valve: There was trivial regurgitation.  - Pulmonary arteries: PA peak pressure: 31 mm Hg (S).   Echo 07/06/21: IMPRESSIONS     1. Left ventricular ejection fraction, by estimation, is 25 to 30%. The  left ventricle has severely decreased function. The left ventricle  demonstrates global  hypokinesis. The left ventricular internal cavity size  was mildly dilated. Left ventricular  diastolic function could not be evaluated. There is dyskinesis of the left  ventricular, mid-apical inferior wall. There is hypokinesis of the left  ventricular, basal-mid inferior wall.   2. Right ventricular systolic function is mildly reduced. The right  ventricular size is moderately enlarged. There is normal pulmonary artery  systolic pressure.   3. Left atrial size was moderately dilated.   4. Right atrial size was moderately dilated.   5. The mitral valve is normal in structure. Mild mitral valve  regurgitation. No evidence of mitral stenosis.   6. The aortic valve is grossly normal. There is mild calcification of the  aortic valve. There is mild thickening of the aortic valve. Aortic valve  regurgitation is not visualized.   7. The inferior vena cava is normal in size with greater than 50%  respiratory variability, suggesting right atrial pressure of 3 mmHg.   Comparison(s): Changes from prior study are noted. While overall EF  appears similar, there are focal hypokinetic/dyskinetic segments in the  inferior wall that are now apparent compared to prior.    EKG:  EKG is ordered today.  The ekg ordered today demonstrates normal sinus rhythm, rate 61, old inferior infarct, poor R wave progression in the anterior leads.  Recent Labs: 02/10/2020: Hemoglobin 14.6; Platelets 226 11/18/2020: ALT 27; BUN 24; Creatinine, Ser 1.19; Potassium 4.4; Sodium 140  Recent Lipid Panel    Component Value Date/Time   CHOL 125 11/18/2020 1017   TRIG 55 11/18/2020 1017   HDL 45 11/18/2020 1017   CHOLHDL 2.8 11/18/2020 1017   CHOLHDL 2.3 10/10/2015 1428   VLDL 9 10/10/2015 1428   LDLCALC 68 11/18/2020 1017    Physical Exam:    VS:  BP 125/68   Pulse 61   Ht '5\' 8"'$  (1.727 m)   Wt 214 lb 12.8 oz (97.4 kg)   SpO2 99%   BMI 32.66 kg/m     Wt Readings from Last 3 Encounters:  12/08/20 214 lb 12.8  oz (97.4 kg)  02/25/20 210 lb (95.3 kg)  09/25/19 215 lb 6.4 oz (97.7 kg)     GEN:  Well nourished, well developed in no acute distress HEENT: Normal NECK: No JVD; No carotid bruits LYMPHATICS: No lymphadenopathy CARDIAC: RRR, no murmurs, rubs, gallops RESPIRATORY:  Clear to auscultation without rales, wheezing or rhonchi  ABDOMEN: Soft, non-tender, non-distended MUSCULOSKELETAL:  No edema; No deformity  SKIN: Warm and dry NEUROLOGIC:  Alert and oriented x 3 PSYCHIATRIC:  Normal affect    ASSESSMENT:    1. Coronary artery disease involving coronary bypass graft of native heart without angina pectoris   2. Chronic systolic heart failure ([place])   3. Hyperlipidemia LDL goal <70   4. Essential hypertension     PLAN:    In order of problems listed above:  CAD s/p CABG: Continue aspirin and statin.  He has no significant anginal symptoms.  Chronic systolic heart failure: Euvolemic on physical exam. I would like to update Echo. If he still has reduced EF we should consider switching losartan to Entresto and adding an SGLT 2 inhibitor +/- aldactone to optimize therapy. Doubt he is a candidate for beta blocker given history of bradycardia.   Hypertension: Blood pressure controlled  Hyperlipidemia: Continue Lipitor. LDL 68 at goal  PAF: Occurred in the postop setting.  No recurrence.    History of DVT: Completed a course of anticoagulation therapy.  7.   Hypothyroidism - unusual that he takes his synthroid BID instead of a daily dose. Should follow up with Dr Arelia Sneddon.    Medication Adjustments/Labs and Tests Ordered: Current medicines are reviewed at length with the patient today.  Concerns regarding medicines are outlined above.  Orders Placed This Encounter  Procedures  EKG 12-Lead    Meds ordered this encounter  Medications   furosemide (LASIX) 40 MG tablet    Sig: Take 1 tablet (40 mg total) by mouth daily.    Dispense:  90 tablet    Refill:  2     There are no  Patient Instructions on file for this visit.   Signed, Jewelz Kobus [place], MD  12/08/2020 9:09 AM    [facility]

## 2021-12-04 ENCOUNTER — Ambulatory Visit: Payer: Medicare Other | Admitting: Cardiology

## 2021-12-14 NOTE — Progress Notes (Signed)
Cardiology Clinic Note   Patient Name: Ryan Ward Date of Encounter: 12/15/2021  Primary Care Provider:  Leonard Downing, MD Primary Cardiologist:  Peter Martinique, MD  Patient Profile    Ryan Ward 76 year old male presents the clinic today for follow-up evaluation of his coronary artery disease and hypertension.  Past Medical History    Past Medical History:  Diagnosis Date   Asthma    VERY MILD   CAD (coronary artery disease)    a. 12/07/14 Inf STEMI/PCI: LAD 85p/m, 37md, LCX 80ost, RCA 30p/m, 469m, 100d (4.0x20 Promus Premier DES). Hosp course complicated by VF arrest, CGS, CHB req temp wire; b. 03/2015 Cath: LAD 85p/m, 95d, LCx 80ost, RCA 30-40p/m/d, RPDA 75, EF 30-35%, Nl CO; c. 04/06/2015 CABGx4 (LIMA->LAD, VG->Diag, Seq VG->prox RPDA->mid RPDA.   Chronic systolic CHF (congestive heart failure) ([place]   a. 03/2015 Echo: EF 30-35%, diff HK.   DVT (deep venous thrombosis) ([place]12/2016   RLE   History of blood transfusion 04/2013   S/P CABG   Hyperlipidemia    Hypertension    Hypertensive heart disease    Ischemic cardiomyopathy    a. 03/2015 Echo: EF 30-35%, diff HK, mild MR, mod dil RA, PASP 496m.   Myocardial infarction (HC[place]/2016   OSA (obstructive sleep apnea)    a. not using CPAP at home, does have machine (06/20/2017)   Paroxysmal atrial fibrillation ([place]  a. 12/2014 In setting of Inf MI->convertred spont.   Pulmonary embolism ([place]  a. 12/2014 R PT and peroneal vein DVT and RUL PE-->Xarelto x 3 mos.   Past Surgical History:  Procedure Laterality Date   CARDIAC CATHETERIZATION N/A 12/07/2014   Procedure: Coronary Stent Intervention;  Surgeon: Peter M JorMartiniqueD;  [facility]: [facility];  Service: Cardiovascular;  Laterality: N/A;  STEMI   CARDIAC CATHETERIZATION N/A 12/07/2014   Procedure: IABP Insertion;  Surgeon: Peter M JorMartiniqueD;  Location: [facility];  Service: Cardiovascular;  Laterality: N/A;   CARDIAC CATHETERIZATION N/A  12/07/2014   Procedure: Temporary Pacemaker;  Surgeon: Peter M JorMartiniqueD;  Location: [facility];  Service: Cardiovascular;  Laterality: N/A;   CARDIAC CATHETERIZATION N/A 12/07/2014   Procedure: Left Heart Cath and Coronary Angiography;  Surgeon: Peter M JorMartiniqueD;  Location: [facility];  Service: Cardiovascular;  Laterality: N/A;   CARDIAC CATHETERIZATION N/A 03/29/2015   Procedure: Right/Left Heart Cath and Coronary Angiography;  Surgeon: Peter M JorMartiniqueD;  Location: [facility];  Service: Cardiovascular;  Laterality: N/A;   CORONARY ARTERY BYPASS GRAFT N/A 04/06/2015   Procedure: CORONARY ARTERY BYPASS GRAFT TIMES FOUR  USING LEFT INTERNAL MAMMARY ARTERY TO THE LEFT ANTERIOR DESCENDING, BILATERAL GREATER SAPHENOUS ENDOVEIN HARVEST GRAFT TO PROXIMAL AND MID POSTERIOR DESCENDING AND DIAGONAL CORONARY ARTERIES AND PLACEMENT OR RIGHT FEMORAL A-LINE. ;  Surgeon: EdwGrace IsaacD;  [facility];  Laterality: N/A;   FRACTURE SURGERY     HERNIA REPAIR     I & D EXTREMITY Right 02/25/2020   Procedure: Right hallux ulcer irrigation and excisional debridement amputation;  Surgeon: HewWylene SimmerD;  [facility] [place];  Laterality: Right;  77m52m OPEN REDUCTION INTERNAL FIXATION (ORIF) PROXIMAL PHALANX Right 06/19/2017   Procedure: OPEN REDUCTION INTERNAL FIXATION (ORIF) PROXIMAL PHALANX;  Surgeon: HaddShona Needles;  [facility] [place] [place];  Laterality: Right;   ORIF ANKLE  FRACTURE Right 06/19/2017   Procedure: OPEN REDUCTION INTERNAL FIXATION (ORIF) ANKLE FRACTURE;  Surgeon: Shona Needles, MD;  Location: [place];  Service: Orthopedics;  Laterality: Right;   ORIF ELBOW FRACTURE Right 06/19/2017   Procedure: OPEN REDUCTION ELBOW DISLOCATION WITH RADIAL HEAD REPLACEMENT;  Surgeon: Shona Needles, MD;  Location: [place];  Service: Orthopedics;  Laterality: Right;   TEE WITHOUT CARDIOVERSION N/A 04/06/2015    Procedure: TRANSESOPHAGEAL ECHOCARDIOGRAM (TEE);  Surgeon: [name], MD;  Location: [facility];  Service: Open Heart Surgery;  Laterality: N/A;   TONSILLECTOMY     UMBILICAL HERNIA REPAIR      Allergies  Allergies  Allergen Reactions   Oxycodone Nausea And Vomiting    History of Present Illness    Ryan Ward has a PMH of STEMI status post percutaneous coronary angioplasty, complete heart block, cardiogenic shock, hypertension, ventricular fibrillation, chronic combined systolic and diastolic CHF, PE, paroxysmal atrial fibrillation, ischemic cardiomyopathy, and sinus bradycardia.  He had inferior STEMI 12/16 and received PCI with DES to his RCA.  His procedure was complicated by cardiogenic shock, ventricular fibrillation and complete heart block.  He was noted to develop atrial fibrillation.  During the admission he developed right lower extremity DVT and right upper lobe PE.  He was placed on Xarelto and completed a course of anticoagulation.  He underwent repeat cardiac catheterization 3/16 which showed patent RCA stent with residual distal RPDA disease and proximal mid LAD disease as well as left circumflex disease.  He underwent CABG x4 on 04/06/2015.  His CABG was complicated by atrial fibrillation which was successfully treated with amiodarone therapy.  Due to significant bradycardia on follow-up metoprolol and amiodarone were discontinued.  His echocardiogram 7/17 showed an EF of 35-40%.  His ICD was deferred.  He wore a cardiac event monitor which did not show recurrence of atrial fibrillation.  He was noted to have osteomyelitis of his right hallux and underwent amputation February.  He was seen in follow-up by Dr. Martinique 12/08/2020.  During that time he continued to do well from a cardiac standpoint.  He denied chest pain dyspnea.  He is not very active but was able to go up the stairs and do yard work without difficulty.  He denied dizziness.  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  There were days when he admitted to skipping his furosemide due to not being able to use the restroom frequently.  He was transitioned to Nada, spironolactone and placed on Jardiance.  He presented to the clinic 02/16/2021 for follow-up evaluation stated he had noticed that since starting spironolactone, Jardiance, and Entresto he was having more throat clearing.  He was previously intolerant of lisinopril.  He also noted that the medication was very expensive costing him around $395 per month.  I  gave him the patient assistance paperwork so that he may fill it out.  He reported that he had been more physically active and noticing more lightheadedness/dizziness.  We reviewed his symptoms prior to starting medications and he noted slight dizziness prior to his new medications.   He had not been checking his blood pressure at home.  He did report some dietary discretion with regards to sugar.  I decreased his furosemide and potassium to every other day, ordered a BMP, had him continue to weigh himself daily, and planned follow-up for 2 to 3 months.  He presented to the clinic 06/14/21 for follow-up evaluation and stated he felt well.  He had  started a new job.  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  He projected that there will be around 2200 employees and reported that they had just increased the funding for the plant from 4 billion to 8.  He was working  8-hour days.  He was tolerating his medication well.  I ordered repeat  echocardiogram and planned follow-up for 4 to 6 months.  He presents to the clinic today for follow-up evaluation and states he has noticed he has a runny nose most of the time.  He feels this may be related to the [facility] medication he is taking.  He continues to be fairly physically active working for [employer] and [employer] before they start working.  We reviewed his most recent echocardiogram  and I recommended that he see EP for evaluation for ICD.  He agrees to appointment.  He is fasting today and we will check fasting lipids and LFTs.  I will continue his current medication regimen and plan follow-up in 6 months.  Today he denies chest pain, shortness of breath, lower extremity edema, fatigue, palpitations, melena, hematuria, hemoptysis, diaphoresis, weakness, presyncope, syncope, orthopnea, and PND.   Home Medications    Prior to Admission medications   Medication Sig Start Date End Date Taking? Authorizing Provider  aspirin EC 81 MG tablet Take 81 mg by mouth daily.    [provider]  atorvastatin (LIPITOR) 80 MG tablet Take 1 tablet (80 mg total) by mouth daily. Advised patient to schedule OV for further refills 10/06/20   Ledora Bottcher, PA  Cholecalciferol (VITAMIN D3) 5000 UNITS TABS Take 5,000 Units by mouth daily.     [provider]  docusate sodium (COLACE) 100 MG capsule Take 1 capsule (100 mg total) by mouth 2 (two) times daily. While taking narcotic pain medicine. 02/25/20   Corky Sing, PA-C  empagliflozin (JARDIANCE) 10 MG TABS tablet Take 1 tablet (10 mg total) by mouth daily before breakfast. 12/27/20   Martinique, Peter M, MD  famotidine (PEPCID) 20 MG tablet Take 20 mg by mouth daily as needed for heartburn or indigestion.    [provider]  furosemide (LASIX) 40 MG tablet Take 1 tablet (40 mg total) by mouth daily. 12/08/20   Martinique, Peter M, MD  KLOR-CON M20 20 MEQ tablet TAKE 2 TABLETS BY MOUTH DAILY Patient taking differently: Take 20 mEq by mouth 2 (two) times daily. 10/07/19   Almyra Deforest, PA  levothyroxine (SYNTHROID) 50 MCG tablet TAKE 1 TABLET BY MOUTH TWICE A DAY Patient taking differently: Take 50 mcg by mouth 2 (two) times daily. 10/07/19   Almyra Deforest, PA  Multiple Vitamin (MULTIVITAMIN WITH MINERALS) TABS tablet Take 1 tablet by mouth daily.    [provider]  ondansetron (ZOFRAN) 4 MG tablet Take 1 tablet (4  mg total) by mouth daily as needed for nausea or vomiting. 02/25/20   Corky Sing, PA-C  PROAIR HFA [id number] Base) MCG/ACT inhaler Inhale 2 puffs into the lungs every 6 (six) hours as needed for wheezing.  02/25/15   [provider]  sacubitril-valsartan (ENTRESTO) 24-26 MG Take 1 tablet by mouth 2 (two) times daily. 12/27/20   Martinique, Peter M, MD  senna (SENOKOT) 8.6 MG TABS tablet Take 2 tablets (17.2 mg total) by mouth 2 (two) times daily. Patient not taking: Reported on 12/08/2020 02/25/20   Corky Sing, PA-C  spironolactone (ALDACTONE) 25 MG  tablet Take 1 tablet (25 mg total) by mouth daily. 12/27/20   Martinique, Peter M, MD  traMADol (ULTRAM) 50 MG tablet tramadol 50 mg tablet  TAKE 1 TABLET (50 MG TOTAL) BY MOUTH EVERY 6 (SIX) HOURS AS NEEDED FOR UP TO 3 DAYS.    [provider]    Family History    Family History  Problem Relation Age of Onset   Heart attack Father    He indicated that his mother is deceased. He indicated that his father is deceased.  Social History    Social History   Socioeconomic History   Marital status: Widowed    Spouse name: Not on file   Number of children: Not on file   Years of education: Not on file   Highest education level: Not on file  Occupational History   Not on file  Tobacco Use   Smoking status: Never   Smokeless tobacco: Never  Vaping Use   Vaping Use: Never used  Substance and Sexual Activity   Alcohol use: Not Currently    Alcohol/week: 0.0 standard drinks of alcohol   Drug use: Never   Sexual activity: Yes  Other Topics Concern   Not on file  Social History Narrative   Works with [employer]   Social Determinants of Health   Financial Resource Strain: Not on file  Food Insecurity: Not on file  Transportation Needs: Not on file  Physical Activity: Not on file  Stress: Not on file  Social Connections: Not on file  Intimate Partner Violence: Not on file     Review of Systems    General:  No  chills, fever, night sweats or weight changes.  Cardiovascular:  No chest pain, dyspnea on exertion, edema, orthopnea, palpitations, paroxysmal nocturnal dyspnea. Dermatological: No rash, lesions/masses Respiratory: No cough, dyspnea Urologic: No hematuria, dysuria Abdominal:   No nausea, vomiting, diarrhea, bright red blood per rectum, melena, or hematemesis Neurologic:  No visual changes, wkns, changes in mental status. All other systems reviewed and are otherwise negative except as noted above.  Physical Exam    VS:  BP 116/78   Pulse 87   Ht '5\' 8"'$  (1.727 m)   Wt 201 lb 6.4 oz (91.4 kg)   SpO2 100%   BMI 30.62 kg/m  , BMI Body mass index is 30.62 kg/m. GEN: Well nourished, well developed, in no acute distress. HEENT: normal. Neck: Supple, no JVD, carotid bruits, or masses. Cardiac: RRR, no murmurs, rubs, or gallops. No clubbing, cyanosis, edema.  Radials/DP/PT 2+ and equal bilaterally.  Respiratory:  Respirations regular and unlabored, clear to auscultation bilaterally. GI: Soft, nontender, nondistended, BS + x 4. MS: no deformity or atrophy. Skin: warm and dry, no rash. Neuro:  Strength and sensation are intact. Psych: Normal affect.  Accessory Clinical Findings    Recent Labs: 02/16/2021: BUN 26; Creatinine, Ser 1.33; Potassium 4.8; Sodium 139   Recent Lipid Panel    Component Value Date/Time   CHOL 125 11/18/2020 1017   TRIG 55 11/18/2020 1017   HDL 45 11/18/2020 1017   CHOLHDL 2.8 11/18/2020 1017   CHOLHDL 2.3 10/10/2015 1428   VLDL 9 10/10/2015 1428   LDLCALC 68 11/18/2020 1017    ECG personally reviewed by me today-none today.  Assessment & Plan   1.  Chronic systolic CHF- euvolemic today.  Stable NYHA class II.    Previously switched from losartan to [place], [place], and spironolactone.  Was not a good candidate for beta-blocker given history  of bradycardia.  Previously reported that he was not sure if he is tolerating Entresto due to  throat clearing.   Allergy to lisinopril.  Tolerated losartan well.   Echocardiogram 07/06/2021 showed an EF of 25-30%, mildly dilated left ventricle and global hypokinesis. Continue Entresto, spironolactone, Jardiance,  Continue furosemide, potassium to every other day Refer to EP for evaluation for ICD  Essential hypertension-BP today 116/78.   Continue spironolactone, Entresto Heart healthy low-sodium diet-salty 6 reviewed Increase physical activity as tolerated  Hyperlipidemia-LDL 68 on 11/18/20.  Reports that he was having some right neck pain and is now taking half dose of atorvastatin.  We reviewed his coronary disease and I recommended that he take 80 mg of atorvastatin.  We will see what his lipid panel shows. Continue aspirin, atorvastatin Heart healthy low-sodium high-fiber diet Increase physical activity as tolerated Repeat fasting lipids and LFTs  Coronary artery disease-stable, no recent episodes of chest discomfort.  Underwent CABG x4 04/06/2015.   Continue atorvastatin, aspirin, Heart healthy low-sodium diet Increase physical activity as tolerated   Disposition: Follow-up with Dr. Martinique in 6 month.  Jossie Ng. Sesar Madewell NP-C    12/15/2021, 10:26 AM [address] [place] [address] Office [phone] Fax [phone]  Notice: This dictation was prepared with Dragon dictation along with smaller phrase technology. Any transcriptional errors that result from this process are unintentional and may not be corrected upon review.  I spent 14 minutes examining this patient, reviewing medications, and using patient centered shared decision making involving her cardiac care.  Prior to her visit I spent greater than 20 minutes reviewing her past medical history,  medications, and prior cardiac tests.

## 2021-12-15 ENCOUNTER — Ambulatory Visit: Payer: Medicare Other | Attending: Cardiology | Admitting: General Practice

## 2021-12-15 ENCOUNTER — Encounter: Payer: Self-pay | Admitting: General Practice

## 2021-12-15 VITALS — BP 116/78 | HR 87 | Ht 68.0 in | Wt 201.4 lb

## 2021-12-15 DIAGNOSIS — I10 Essential (primary) hypertension: Secondary | ICD-10-CM

## 2021-12-15 DIAGNOSIS — R931 Abnormal findings on diagnostic imaging of heart and coronary circulation: Secondary | ICD-10-CM | POA: Diagnosis present

## 2021-12-15 DIAGNOSIS — E785 Hyperlipidemia, unspecified: Secondary | ICD-10-CM

## 2021-12-15 DIAGNOSIS — I5022 Chronic systolic (congestive) heart failure: Secondary | ICD-10-CM | POA: Diagnosis not present

## 2021-12-15 DIAGNOSIS — I25810 Atherosclerosis of coronary artery bypass graft(s) without angina pectoris: Secondary | ICD-10-CM

## 2021-12-15 LAB — HEPATIC FUNCTION PANEL
ALT: 19 IU/L (ref 0–44)
AST: 23 IU/L (ref 0–40)
Albumin: 4.3 g/dL (ref 3.8–4.8)
Alkaline Phosphatase: 87 IU/L (ref 44–121)
Bilirubin Total: 0.7 mg/dL (ref 0.0–1.2)
Bilirubin, Direct: 0.22 mg/dL (ref 0.00–0.40)
Total Protein: 6.4 g/dL (ref 6.0–8.5)

## 2021-12-15 LAB — LIPID PANEL
Chol/HDL Ratio: 2.4 ratio (ref 0.0–5.0)
Cholesterol, Total: 121 mg/dL (ref 100–199)
HDL: 51 mg/dL (ref >39)
LDL Chol Calc (NIH): 59 mg/dL (ref 0–99)
Triglycerides: 45 mg/dL (ref 0–149)
VLDL Cholesterol Cal: 11 mg/dL (ref 5–40)

## 2021-12-15 NOTE — Patient Instructions (Signed)
Medication Instructions:  The current medical regimen is effective;  continue present plan and medications as directed. Please refer to the Current Medication list given to you today.  *If you need a refill on your cardiac medications before your next appointment, please call your pharmacy*   Lab Work: LIPID AND LFT TODAY  If you have labs (blood work) drawn today and your tests are completely normal, you will receive your results only by:  [place] (if you have MyChart) OR A paper copy in the mail If you have any lab test that is abnormal or we need to change your treatment, we will call you to review the results.  Testing/Procedures: NONE  Follow-Up: At [facility], you and your health needs are our priority.  As part of our continuing mission to provide you with exceptional heart care, we have created designated Provider Care Teams.  These Care Teams include your primary Cardiologist (physician) and Advanced Practice Providers (APPs -  Physician Assistants and Nurse Practitioners) who all work together to provide you with the care you need, when you need it.  Your next appointment:   6 month(s)  The format for your next appointment:   In Person  Provider:   Peter Martinique, MD     Other Instructions REFERRAL TO EP TO DISCUSS ICD   Important Information About Sugar

## 2022-01-11 ENCOUNTER — Telehealth: Payer: Self-pay | Admitting: Cardiology

## 2022-01-11 NOTE — Telephone Encounter (Signed)
*  STAT* If patient is at the pharmacy, call can be transferred to refill team.   1. Which medications need to be refilled? (please list name of each medication and dose if known) levothyroxine (SYNTHROID) 50 MCG tablet   2. Which pharmacy/location (including street and city if local pharmacy) is medication to be sent to? [id number] - [place], [place]   3. Do they need a 30 day or 90 day supply? 90 day  Patient is out of medication. He wants to know if he still needs to keep taking this medication. Please advise.

## 2022-01-12 MED ORDER — LEVOTHYROXINE SODIUM 50 MCG PO TABS: 50.0000 ug | ORAL_TABLET | Freq: Two times a day (BID) | ORAL | 1 refills | Status: DC

## 2022-01-25 ENCOUNTER — Institutional Professional Consult (permissible substitution): Payer: Medicare Other | Admitting: Internal Medicine

## 2022-01-30 ENCOUNTER — Telehealth: Payer: Self-pay | Admitting: Cardiology

## 2022-01-30 NOTE — Telephone Encounter (Signed)
   Pre-operative Risk Assessment    Patient Name: Ryan Ward  DOB: November 26, 1945 MRN: [id number]      Request for Surgical Clearance    Procedure:   Cataract Surgery   Date of Surgery:  Clearance 02/07/22                                 Surgeon:  Dr. Sharen Counter Surgeon's Group or Practice Name:  [facility] Phone number:  [phone] Fax number:  [phone]   Type of Clearance Requested:   - Medical    Type of Anesthesia:   IV Sedation    Additional requests/questions:    Crist Infante   01/30/2022, 1:35 PM

## 2022-01-30 NOTE — Telephone Encounter (Signed)
   Patient Name: Ryan Ward  DOB: 1945/07/19 MRN: [id number]  Primary Cardiologist: Peter Martinique, MD  Chart reviewed as part of pre-operative protocol coverage. Cataract extractions are recognized in guidelines as low risk surgeries that do not typically require specific preoperative testing or holding of blood thinner therapy. Therefore, given past medical history and time since last visit, based on ACC/AHA guidelines, Jhayden Demuro would be at acceptable risk for the planned procedure without further cardiovascular testing.   I will route this recommendation to the requesting party via Epic fax function and remove from pre-op pool.  Please call with questions.  Emmaline Life, NP-C  01/30/2022, 1:55 PM [address] Office [phone] Fax [phone]

## 2022-02-19 ENCOUNTER — Ambulatory Visit: Payer: Medicare Other | Attending: Internal Medicine | Admitting: Internal Medicine

## 2022-02-19 ENCOUNTER — Encounter: Payer: Self-pay | Admitting: *Deleted

## 2022-02-19 ENCOUNTER — Other Ambulatory Visit: Payer: Self-pay | Admitting: Cardiology

## 2022-02-19 ENCOUNTER — Encounter: Payer: Self-pay | Admitting: Internal Medicine

## 2022-02-19 VITALS — BP 114/76 | HR 83 | Ht 68.0 in | Wt 206.4 lb

## 2022-02-19 DIAGNOSIS — I25708 Atherosclerosis of coronary artery bypass graft(s), unspecified, with other forms of angina pectoris: Secondary | ICD-10-CM

## 2022-02-19 DIAGNOSIS — I255 Ischemic cardiomyopathy: Secondary | ICD-10-CM

## 2022-02-19 DIAGNOSIS — I5022 Chronic systolic (congestive) heart failure: Secondary | ICD-10-CM | POA: Insufficient documentation

## 2022-02-19 DIAGNOSIS — R931 Abnormal findings on diagnostic imaging of heart and coronary circulation: Secondary | ICD-10-CM

## 2022-02-19 MED ORDER — APIXABAN 5 MG PO TABS: 5.0000 mg | ORAL_TABLET | Freq: Two times a day (BID) | ORAL | 3 refills | Status: DC

## 2022-02-19 NOTE — Patient Instructions (Addendum)
Medication Instructions:   ** Stop Aspirin  ** Start Eliquis 5 mg - 1 tablet by mouth twice daily  *If you need a refill on your cardiac medications before your next appointment, please call your pharmacy*   Lab Work: None ordered.  If you have labs (blood work) drawn today and your tests are completely normal, you will receive your results only by: [place] (if you have MyChart) OR A paper copy in the mail If you have any lab test that is abnormal or we need to change your treatment, we will call you to review the results.   Testing/Procedures: Your physician has recommended that you have a Cardioversion (DCCV). Electrical Cardioversion uses a jolt of electricity to your heart either through paddles or wired patches attached to your chest. This is a controlled, usually prescheduled, procedure. Defibrillation is done under light anesthesia in the hospital, and you usually go home the day of the procedure. This is done to get your heart back into a normal rhythm. You are not awake for the procedure. Please see the instruction sheet given to you today.    Follow-Up: At [facility], you and your health needs are our priority.  As part of our continuing mission to provide you with exceptional heart care, we have created designated Provider Care Teams.  These Care Teams include your primary Cardiologist (physician) and Advanced Practice Providers (APPs -  Physician Assistants and Nurse Practitioners) who all work together to provide you with the care you need, when you need it.  We recommend signing up for the patient portal called "MyChart".  Sign up information is provided on this After Visit Summary.  MyChart is used to connect with patients for Virtual Visits (Telemedicine).  Patients are able to view lab/test results, encounter notes, upcoming appointments, etc.  Non-urgent messages can be sent to your provider as well.   To learn more about what you can do with MyChart, go  to [email or web address].    Your next appointment:   03/16/2022 at 11am  Other Instructions     Dear Ryan Ward  You are scheduled for a Cardioversion on Monday, March 18 with Dr. Gasper Sells.  Please arrive at the [facility] (Main Entrance A) at [facility]: [address] [place], [place] [address] at 6:00 AM.   DIET:  Nothing to eat or drink after midnight except a sip of water with medications (see medication instructions below)  MEDICATION INSTRUCTIONS: Do not take your Spironolactone or Furosemide the morning of your procedure Continue taking your anticoagulant (blood thinner): Apixaban (Eliquis).  You will need to continue this after your procedure until you are told by your provider that it is safe to stop.    LABS:   Your labs will be done at the hospital prior to your procedure - you will need to arrive 1 and 1/2 hours prior to your procedure.  FYI:  For your safety, and to allow [place] to monitor your vital signs accurately during the surgery/procedure we request: If you have artificial nails, gel coating, SNS etc, please have those removed prior to your surgery/procedure. Not having the nail coverings /polish removed may result in cancellation or delay of your surgery/procedure.  You must have a responsible person to drive you home and stay in the waiting area during your procedure. Failure to do so could result in cancellation.  Bring your insurance cards.  *Special Note: Every effort is made to have your procedure done on time. Occasionally there  are emergencies that occur at the hospital that may cause delays. Please be patient if a delay does occur.

## 2022-02-19 NOTE — Telephone Encounter (Signed)
-----   Message from Peter M Martinique, MD sent at 02/19/2022  3:32 PM EST ----- Hilda Blades can you do me a favor and order a coronary PET CT on Mr Nauss. Was seen by Dr Caryl Comes since EF has dropped but needs ischemic evaluation since he hasn't had since bypass in 2017. I can put attestation in. Thanks a million  PJ

## 2022-02-19 NOTE — H&P (View-Only) (Signed)
     ELECTROPHYSIOLOGY CONSULT NOTE  Patient ID: Ryan Ward, MRN: [id number], DOB/AGE: 77/14/1947 76 y.o. Admit date: (Not on file) Date of Consult: 02/19/2022  Primary Physician: Elkins, Wilson Oliver, MD Primary Cardiologist: PJ     Ryan Ward is a 76 y.o. male who is being seen today for the evaluation of ICD at the request of PJ.    HPI Ryan Ward is a 76 y.o. male  referred for consideration of ICD for primary prevention;   MI and CABG 2017 with course complicated by shock, VF, transient complete heart block, A-fib, DVT and pulmonary embolism.  Original considerations of ICD following MI deferred 2/2 improved LV function    Mild functional impairment with dyspnea.  No chest pain or palpitations.  No peripheral edema.  Able to do pretty much what he wants in the yard.  History of remote palpitations.  A slow pounding    DATE TEST EF   7/17 Echo  35-40 %   7/23 Echo  25-30%    Date Cr K Hgb  2/22   14.6    2/23 1.33 4.8              Past Medical History:  Diagnosis Date   Asthma    VERY MILD   CAD (coronary artery disease)    a. 12/07/14 Inf STEMI/PCI: LAD 85p/m, 95m/d, LCX 80ost, RCA 30p/m, 40m/d, 100d (4.0x20 Promus Premier DES). Hosp course complicated by VF arrest, CGS, CHB req temp wire; b. 03/2015 Cath: LAD 85p/m, 95d, LCx 80ost, RCA 30-40p/m/d, RPDA 75, EF 30-35%, Nl CO; c. 04/06/2015 CABGx4 (LIMA->LAD, VG->Diag, Seq VG->prox RPDA->mid RPDA.   Chronic systolic CHF (congestive heart failure) (HCC)    a. 03/2015 Echo: EF 30-35%, diff HK.   DVT (deep venous thrombosis) (HCC) 12/2014   RLE   History of blood transfusion 04/2013   S/P CABG   Hyperlipidemia    Hypertension    Hypertensive heart disease    Ischemic cardiomyopathy    a. 03/2015 Echo: EF 30-35%, diff HK, mild MR, mod dil RA, PASP 49mmHg.   Myocardial infarction (HCC) 12/2014   OSA (obstructive sleep apnea)    a. not using CPAP at home, does have machine (06/20/2017)   Paroxysmal atrial  fibrillation (HCC)    a. 12/2014 In setting of Inf MI->convertred spont.   Pulmonary embolism (HCC)    a. 12/2014 R PT and peroneal vein DVT and RUL PE-->Xarelto x 3 mos.      Surgical History:  Past Surgical History:  Procedure Laterality Date   CARDIAC CATHETERIZATION N/A 12/07/2014   Procedure: Coronary Stent Intervention;  Surgeon: Peter M Jordan, MD;  [facility];  Service: Cardiovascular;  Laterality: N/A;  STEMI   CARDIAC CATHETERIZATION N/A 12/07/2014   Procedure: IABP Insertion;  Surgeon: Peter M Jordan, MD;  [facility];  Service: Cardiovascular;  Laterality: N/A;   CARDIAC CATHETERIZATION N/A 12/07/2014   Procedure: Temporary Pacemaker;  Surgeon: Peter M Jordan, MD;  [facility];  Service: Cardiovascular;  Laterality: N/A;   CARDIAC CATHETERIZATION N/A 12/07/2014   Procedure: Left Heart Cath and Coronary Angiography;  Surgeon: Peter M Jordan, MD;  [facility];  Service: Cardiovascular;  Laterality: N/A;   CARDIAC CATHETERIZATION N/A 03/29/2015   Procedure: Right/Left Heart Cath and Coronary Angiography;  Surgeon: Peter M Jordan, MD;  [facility];  Service: Cardiovascular;  Laterality: N/A;   CORONARY ARTERY BYPASS GRAFT   N/A 04/06/2015   Procedure: CORONARY ARTERY BYPASS GRAFT TIMES FOUR  USING LEFT INTERNAL MAMMARY ARTERY TO THE LEFT ANTERIOR DESCENDING, BILATERAL GREATER SAPHENOUS ENDOVEIN HARVEST GRAFT TO PROXIMAL AND MID POSTERIOR DESCENDING AND DIAGONAL CORONARY ARTERIES AND PLACEMENT OR RIGHT FEMORAL A-LINE. ;  Surgeon: Edward B Gerhardt, MD;  [facility];  Service: Open Heart Surgery;  Laterality: N/A;   FRACTURE SURGERY     HERNIA REPAIR     I & D EXTREMITY Right 02/25/2020   Procedure: Right hallux ulcer irrigation and excisional debridement amputation;  Surgeon: Hewitt, John, MD;  [facility];  Service: Orthopedics;  Laterality: Right;  45min   OPEN REDUCTION INTERNAL FIXATION (ORIF)  PROXIMAL PHALANX Right 06/19/2017   Procedure: OPEN REDUCTION INTERNAL FIXATION (ORIF) PROXIMAL PHALANX;  Surgeon: Haddix, Kevin P, MD;  [facility];  Service: Orthopedics;  Laterality: Right;   ORIF ANKLE FRACTURE Right 06/19/2017   Procedure: OPEN REDUCTION INTERNAL FIXATION (ORIF) ANKLE FRACTURE;  Surgeon: Haddix, Kevin P, MD;  [facility];  Service: Orthopedics;  Laterality: Right;   ORIF ELBOW FRACTURE Right 06/19/2017   Procedure: OPEN REDUCTION ELBOW DISLOCATION WITH RADIAL HEAD REPLACEMENT;  Surgeon: Haddix, Kevin P, MD;  [facility];  Service: Orthopedics;  Laterality: Right;   TEE WITHOUT CARDIOVERSION N/A 04/06/2015   Procedure: TRANSESOPHAGEAL ECHOCARDIOGRAM (TEE);  Surgeon: Edward B Gerhardt, MD;  [facility];  Service: Open Heart Surgery;  Laterality: N/A;   TONSILLECTOMY     UMBILICAL HERNIA REPAIR       Home Meds: Current Meds  Medication Sig   aspirin EC 81 MG tablet Take 81 mg by mouth daily.   atorvastatin (LIPITOR) 80 MG tablet Take 1 tablet (80 mg total) by mouth daily. Advised patient to schedule OV for further refills (Patient taking differently: Take 40 mg by mouth daily.)   Cholecalciferol (VITAMIN D3) 5000 UNITS TABS Take 5,000 Units by mouth daily.    ENTRESTO 24-26 MG TAKE ONE TABLET BY MOUTH TWICE A DAY   furosemide (LASIX) 40 MG tablet Take 1 tablet (40 mg total) by mouth every other day.   JARDIANCE 10 MG TABS tablet TAKE ONE TABLET BY MOUTH EVERY MORNING BEFORE BREAKFAST   levothyroxine (SYNTHROID) 50 MCG tablet Take 1 tablet (50 mcg total) by mouth 2 (two) times daily.   Multiple Vitamin (MULTIVITAMIN WITH MINERALS) TABS tablet Take 1 tablet by mouth daily.   ondansetron (ZOFRAN) 4 MG tablet Take 1 tablet (4 mg total) by mouth daily as needed for nausea or vomiting.   potassium chloride SA (KLOR-CON M20) 20 MEQ tablet Take 2 tablets (40 mEq total) by mouth every other day.   spironolactone (ALDACTONE) 25 MG tablet TAKE ONE TABLET BY MOUTH DAILY     Allergies:  Allergies  Allergen Reactions   Oxycodone Nausea And Vomiting    Social History   Socioeconomic History   Marital status: Widowed    Spouse name: Not on file   Number of children: Not on file   Years of education: Not on file   Highest education level: Not on file  Occupational History   Not on file  Tobacco Use   Smoking status: Never   Smokeless tobacco: Never  Vaping Use   Vaping Use: Never used  Substance and Sexual Activity   Alcohol use: Not Currently    Alcohol/week: 0.0 standard drinks of alcohol   Drug use: Never   Sexual activity: Yes  Other Topics Concern   Not on file    Social History Narrative   Works with [employer]   Social Determinants of Health   Financial Resource Strain: Not on file  Food Insecurity: Not on file  Transportation Needs: Not on file  Physical Activity: Not on file  Stress: Not on file  Social Connections: Not on file  Intimate Partner Violence: Not on file     Family History  Problem Relation Age of Onset   Heart attack Father      ROS:  Please see the history of present illness.     All other systems reviewed and negative.    Physical Exam: Blood pressure 114/76, pulse 83, height 5' 8" (1.727 m), weight 206 lb 6.4 oz (93.6 kg), SpO2 98 %. General: Well developed, well nourished male in no acute distress. Head: Normocephalic, atraumatic, sclera non-icteric, no xanthomas, nares are without discharge. EENT: normal  Lymph Nodes:  none Neck: Negative for carotid bruits. JVD not elevated. Back:without scoliosis kyphosis Lungs: Clear bilaterally to auscultation without wheezes, rales, or rhonchi. Breathing is unlabored. Heart: RRR with S1 S2. No murmur . No rubs, or gallops appreciated. Abdomen: Soft, non-tender, non-distended with normoactive bowel sounds. No hepatomegaly. No rebound/guarding. No obvious abdominal masses. Msk:  Strength and tone appear normal for age. Extremities: No clubbing or cyanosis. No   edema.  Distal pedal pulses are 2+ and equal bilaterally. Skin: Warm and Dry Neuro: Alert and oriented X 3. CN III-XII intact Grossly normal sensory and motor function . Psych:  Responds to questions appropriately with a normal affect.        EKG: trial flutter with an atrial cycle length of 260 ms with 3: 1 conduction     Assessment and Plan:  Atrial flutter new onset  Congestive heart failure-class IIa  Ischemic cardiomyopathy  Hypertension  Palpitations  Beta-blocker intolerance    The patient has newly identified atrial flutter with 3: 1 conduction.  We have discussed the thromboembolic risk potential and the potential impact also on left ventricular function.  Will discontinue his aspirin, begin him on Eliquis, and undertake cardioversion in 3-4 weeks.  We also reviewed the bleeding risks of Eliquis.  As relates to his left ventricular dysfunction and the issue of risk stratification and an ICD, first his risks are mitigated by his functional status.  In addition, we will need to understand whether progressive ischemic disease has contributed to his left ventricular dysfunction, I have reviewed this with Dr. PJ and we will undertake PET Myoview.  Furthermore, the stable heart rate at 85 related to his atrial flutter and 3: 1 conduction could also result in worsening LV function.  Following cardioversion following review of his PET, we will anticipate a repeat echocardiogram and then reassessment for risk stratification.  In the past, he has not tolerated beta-blockers, perhaps in the future Ryan Ward  

## 2022-02-19 NOTE — Addendum Note (Signed)
Addended by: Deboraha Sprang on: 02/19/2022 05:31 PM   Modules accepted: Orders

## 2022-02-19 NOTE — Progress Notes (Signed)
ELECTROPHYSIOLOGY CONSULT NOTE  Patient ID: Ryan Ward, MRN: [id number], DOB/AGE: 77-20-1947 77 y.o. Admit date: (Not on file) Date of Consult: 02/19/2022  Primary Physician: Leonard Downing, MD Primary Cardiologist: Jaython Stofflet is a 77 y.o. male who is being seen today for the evaluation of ICD at the request of PJ.    HPI Ryan Ward is a 77 y.o. male  referred for consideration of ICD for primary prevention;   MI and CABG 2017 with course complicated by shock, VF, transient complete heart block, A-fib, DVT and pulmonary embolism.  Original considerations of ICD following MI deferred 2/2 improved LV function    Mild functional impairment with dyspnea.  No chest pain or palpitations.  No peripheral edema.  Able to do pretty much what he wants in the yard.  History of remote palpitations.  A slow pounding    DATE TEST EF   7/17 Echo  35-40 %   7/23 Echo  25-30%    Date Cr K Hgb  2/22   14.6    2/23 1.33 4.8              Past Medical History:  Diagnosis Date   Asthma    VERY MILD   CAD (coronary artery disease)    a. 12/07/14 Inf STEMI/PCI: LAD 85p/m, 20md, LCX 80ost, RCA 30p/m, 452m, 100d (4.0x20 Promus Premier DES). Hosp course complicated by VF arrest, CGS, CHB req temp wire; b. 03/2015 Cath: LAD 85p/m, 95d, LCx 80ost, RCA 30-40p/m/d, RPDA 75, EF 30-35%, Nl CO; c. 04/06/2015 CABGx4 (LIMA->LAD, VG->Diag, Seq VG->prox RPDA->mid RPDA.   [place]ronic [place]) [place]   a. 03/2015 Echo: EF 30-35%, diff HK.   [place]   RLE   History of blood transfusion 04/2013   S/P CABG   Hyperlipidemia    Hypertension    Hypertensive heart disease    Ischemic cardiomyopathy    a. 03/2015 Echo: EF 30-35%, diff HK, mild MR, mod dil RA, PASP 4981m.   [place]   OSA (obstructive sleep apnea)    a. not using CPAP at home, does have machine (06/20/2017)   Paroxys[place]  [place]ibrillation ([place]  a. 12/2014 In setting of Inf MI->convertred spont.   [place]  a. 12/2014 R PT and peroneal vein DVT and RUL PE-->Xarelto x 3 mos.      Surgical History:  Past Surgical History:  Procedure Laterality Date   CARDIAC CATHETERIZATION N/A 12/07/2014   Procedure: Coronary Stent Intervention;  Surgeon: Peter M JorMartiniqueD;  [facility];  Service: Cardiovascular;  Laterality: N/A;  STEMI   CARDIAC CATHETERIZATION N/A 12/07/2014   Procedure: IABP Insertion;  Surgeon: Peter M JorMartiniqueD;  [facility] [place] [facility];  Service: Cardiovascular;  Laterality: N/A;   CARDIAC CATHETERIZATION N/A 12/07/2014   Procedure: Temporary Pacemaker;  Surgeon: Peter M JorMartiniqueD;  [facility] [place];  Service: Cardiovascular;  Laterality: N/A;   CARDIAC CATHETERIZATION N/A 12/07/2014   Procedure: Left Heart Cath and Coronary Angiography;  Surgeon: Peter M JorMartiniqueD;  [facility] [place] [facility];  Service: Cardiovascular;  Laterality: N/A;   CARDIAC CATHETERIZATION N/A 03/29/2015   Procedure: Right/Left Heart Cath and Coronary Angiography;  Surgeon: Peter M JorMartiniqueD;  [facility] [place];  Service: Cardiovascular;  Laterality: N/A;   CORONARY ARTERY BYPASS GRAFT  N/A 04/06/2015   Procedure: CORONARY ARTERY BYPASS GRAFT TIMES FOUR  USING LEFT INTERNAL MAMMARY ARTERY TO THE LEFT ANTERIOR DESCENDING, BILATERAL GREATER SAPHENOUS ENDOVEIN HARVEST GRAFT TO PROXIMAL AND MID POSTERIOR DESCENDING AND DIAGONAL CORONARY ARTERIES AND PLACEMENT OR RIGHT FEMORAL A-LINE. ;  Surgeon: [name], MD;  Location: [facility];  Service: Open Heart Surgery;  Laterality: N/A;   FRACTURE SURGERY     HERNIA REPAIR     I & D EXTREMITY Right 02/25/2020   Procedure: Right hallux ulcer irrigation and excisional debridement amputation;  Surgeon: Wylene Simmer, MD;  Location: [facility];  Service: Orthopedics;  Laterality: Right;  31mn   OPEN REDUCTION INTERNAL FIXATION (ORIF)  PROXIMAL PHALANX Right 06/19/2017   Procedure: OPEN REDUCTION INTERNAL FIXATION (ORIF) PROXIMAL PHALANX;  Surgeon: HShona Needles MD;  [facility]  Service: Orthopedics;  Laterality: Right;   ORIF ANKLE FRACTURE Right 06/19/2017   Procedure: OPEN REDUCTION INTERNAL FIXATION (ORIF) ANKLE FRACTURE;  Surgeon: HShona Needles MD;  [place] [place]  Service: Orthopedics;  Laterality: Right;   ORIF ELBOW FRACTURE Right 06/19/2017   Procedure: OPEN REDUCTION ELBOW DISLOCATION WITH RADIAL HEAD REPLACEMENT;  Surgeon: HShona Needles MD;  [place]  Service: Orthopedics;  Laterality: Right;   TEE WITHOUT CARDIOVERSION N/A 04/06/2015   Procedure: TRANSESOPHAGEAL ECHOCARDIOGRAM (TEE);  Surgeon: EGrace Isaac MD;  [facility]  Service: Open Heart Surgery;  Laterality: N/A;   TONSILLECTOMY     UMBILICAL HERNIA REPAIR       Home Meds: Current Meds  Medication Sig   aspirin EC 81 MG tablet Take 81 mg by mouth daily.   atorvastatin (LIPITOR) 80 MG tablet Take 1 tablet (80 mg total) by mouth daily. Advised patient to schedule OV for further refills (Patient taking differently: Take 40 mg by mouth daily.)   Cholecalciferol (VITAMIN D3) 5000 UNITS TABS Take 5,000 Units by mouth daily.    ENTRESTO 24-26 MG TAKE ONE TABLET BY MOUTH TWICE A DAY   furosemide (LASIX) 40 MG tablet Take 1 tablet (40 mg total) by mouth every other day.   JARDIANCE 10 MG TABS tablet TAKE ONE TABLET BY MOUTH EVERY MORNING BEFORE BREAKFAST   levothyroxine (SYNTHROID) 50 MCG tablet Take 1 tablet (50 mcg total) by mouth 2 (two) times daily.   Multiple Vitamin (MULTIVITAMIN WITH MINERALS) TABS tablet Take 1 tablet by mouth daily.   ondansetron (ZOFRAN) 4 MG tablet Take 1 tablet (4 mg total) by mouth daily as needed for nausea or vomiting.   potassium chloride SA (KLOR-CON M20) 20 MEQ tablet Take 2 tablets (40 mEq total) by mouth every other day.   spironolactone (ALDACTONE) 25 MG tablet TAKE ONE TABLET BY MOUTH DAILY     Allergies:  Allergies  Allergen Reactions   Oxycodone Nausea And Vomiting    Social History   Socioeconomic History   Marital status: Widowed    Spouse name: Not on file   Number of children: Not on file   Years of education: Not on file   Highest education level: Not on file  Occupational History   Not on file  Tobacco Use   Smoking status: Never   Smokeless tobacco: Never  Vaping Use   Vaping Use: Never used  Substance and Sexual Activity   Alcohol use: Not Currently    Alcohol/week: 0.0 standard drinks of alcohol   Drug use: Never   Sexual activity: Yes  Other Topics Concern   Not on file  Social History Narrative   Works with [name]   Social Determinants of Radio broadcast assistant Strain: Not on file  Food Insecurity: Not on file  Transportation Needs: Not on file  Physical Activity: Not on file  Stress: Not on file  Social Connections: Not on file  Intimate Partner Violence: Not on file     Family History  Problem Relation Age of Onset   Heart attack Father      ROS:  Please see the history of present illness.     All other systems reviewed and negative.    Physical Exam: Blood pressure 114/76, pulse 83, height 5' 8"$  (1.727 m), weight 206 lb 6.4 oz (93.6 kg), SpO2 98 %. General: Well developed, well nourished male in no acute distress. Head: Normocephalic, atraumatic, sclera non-icteric, no xanthomas, nares are without discharge. EENT: normal  Lymph Nodes:  none Neck: Negative for carotid bruits. JVD not elevated. Back:without scoliosis kyphosis Lungs: Clear bilaterally to auscultation without wheezes, rales, or rhonchi. Breathing is unlabored. Heart: RRR with S1 S2. No murmur . No rubs, or gallops appreciated. Abdomen: Soft, non-tender, non-distended with normoactive bowel sounds. No hepatomegaly. No rebound/guarding. No obvious abdominal masses. Msk:  Strength and tone appear normal for age. Extremities: No clubbing or cyanosis. No   edema.  Distal pedal pulses are 2+ and equal bilaterally. Skin: Warm and Dry Neuro: Alert and oriented X 3. CN III-XII intact Grossly normal sensory and motor function . Psych:  Responds to questions appropriately with a normal affect.        EKG: trial flutter with an atrial cycle length of 260 ms with 3: 1 conduction     Assessment and Plan:  Atrial flutter new onset  Congestive heart failure-class IIa  Ischemic cardiomyopathy  Hypertension  Palpitations  Beta-blocker intolerance    The patient has newly identified atrial flutter with 3: 1 conduction.  We have discussed the thromboembolic risk potential and the potential impact also on left ventricular function.  Will discontinue his aspirin, begin him on Eliquis, and undertake cardioversion in 3-4 weeks.  We also reviewed the bleeding risks of Eliquis.  As relates to his left ventricular dysfunction and the issue of risk stratification and an ICD, first his risks are mitigated by his functional status.  In addition, we will need to understand whether progressive ischemic disease has contributed to his left ventricular dysfunction, I have reviewed this with Dr. Shirlee More and we will undertake PET Myoview.  Furthermore, the stable heart rate at 85 related to his atrial flutter and 3: 1 conduction could also result in worsening LV function.  Following cardioversion following review of his PET, we will anticipate a repeat echocardiogram and then reassessment for risk stratification.  In the past, he has not tolerated beta-blockers, perhaps in the future Virl Axe

## 2022-02-21 ENCOUNTER — Telehealth: Payer: Self-pay

## 2022-02-21 NOTE — Telephone Encounter (Signed)
PA started through [place] (Key: [id number])  form thumbnail [employer] is reviewing your PA request. Typically an electronic response will be received within 24-72 hours. To check for an update later, open this request from your dashboard.  You may close this dialog and return to your dashboard to perform other tasks.

## 2022-02-23 ENCOUNTER — Telehealth: Payer: Self-pay | Admitting: Internal Medicine

## 2022-02-23 MED ORDER — RIVAROXABAN 20 MG PO TABS: 20.0000 mg | ORAL_TABLET | Freq: Every day | ORAL | 0 refills | Status: DC

## 2022-02-23 NOTE — Telephone Encounter (Signed)
Pt c/o medication issue:  1. Name of Medication: apixaban (ELIQUIS) 5 MG TABS tablet   2. How are you currently taking this medication (dosage and times per day)? Not taking  3. Are you having a reaction (difficulty breathing--STAT)? no  4. What is your medication issue? Patient states when he went to pick up the medication the pharmacy said the insurance denied the claim. He says the office needs to prescribe xarelto instead. He says the next day the insurance told him they already contacted the office so he is calling to follow up.

## 2022-02-23 NOTE — Telephone Encounter (Signed)
Returned call to patient.  Patient states insurance does not cover Eliquis, but will cover Xarelto.  Spoke with DOD Dr. Radford Pax to recommend dosage of Xarelto, per Dr. Radford Pax: switch from Eliquis to Xarelto '20mg'$  QD.  Medication list updated.  Sent prescription for 30 days to [facility] on [facility]. Patient will call for refill to get 90 day supply after this first 30 days.  Patient expressed appreciation for call.

## 2022-03-12 ENCOUNTER — Telehealth: Payer: Self-pay | Admitting: Internal Medicine

## 2022-03-12 ENCOUNTER — Encounter: Payer: Self-pay | Admitting: Cardiology

## 2022-03-12 NOTE — Telephone Encounter (Signed)
Patient called to reschedule Nurse visit on 3/15 to next week Mon, Tue, Wed or Thur.  Patient stated he will follow-up via MyChart.

## 2022-03-12 NOTE — Telephone Encounter (Signed)
Spoke with Ryan Ward who is requesting to reschedule his nurse visit on 3/15 for EKG.  Advised Ryan Ward this appointment is necessary so that his EKG can be completed and therefore determine if outpt cardioversion scheduled for 3/18 is needed.  Ryan Ward states understanding but then reports he is unaware of cardioversion scheduled for 3/18.  Advised date,time and instructions for this were reviewed and written copy was given to him during his 2/19 appt with Dr Caryl Comes.  Ryan Ward is driving and requested information be send to him via MyChart.  Instructions sent to Ryan Ward as requested via [place].  He will call back with any further questions or concerns.

## 2022-03-12 NOTE — Telephone Encounter (Signed)
Pt called to r/s his RN visit this Friday, he was also confused on what the RN visit was for, please advise

## 2022-03-12 NOTE — Telephone Encounter (Signed)
Error

## 2022-03-16 ENCOUNTER — Ambulatory Visit: Payer: Medicare Other | Attending: Cardiology | Admitting: Cardiology

## 2022-03-16 VITALS — BP 110/60 | HR 88 | Ht 68.0 in | Wt 205.6 lb

## 2022-03-16 DIAGNOSIS — E876 Hypokalemia: Secondary | ICD-10-CM | POA: Diagnosis present

## 2022-03-16 DIAGNOSIS — I4892 Unspecified atrial flutter: Secondary | ICD-10-CM

## 2022-03-16 MED ORDER — POTASSIUM CHLORIDE CRYS ER 20 MEQ PO TBCR: 20.0000 meq | EXTENDED_RELEASE_TABLET | ORAL | 3 refills | Status: DC

## 2022-03-16 NOTE — Progress Notes (Signed)
   Nurse Visit   Date of Encounter: 03/16/2022 ID: Ryan Ward, DOB [birth year], MRN [id number]  PCP:  Leonard Downing, MD   [facility] Cardiologist:  Peter Martinique, MD Electrophysiologist:  Virl Axe, MD      Visit Details   VS:  BP 110/60   Pulse 88   Ht 5\' 8"  (1.727 m)   SpO2 96%   BMI 31.38 kg/m  , BMI Body mass index is 31.38 kg/m.  Wt Readings from Last 3 Encounters:  02/19/22 206 lb 6.4 oz (93.6 kg)  12/15/21 201 lb 6.4 oz (91.4 kg)  06/14/21 208 lb 9.6 oz (94.6 kg)     Reason for visit: EKG prior to cardioversion Performed today: Vitals, EKG, Provider consulted:[place], and Education Changes (medications, testing, etc.) : No changes, proceed with cardioversion. Length of Visit: 30 minutes    Medications Adjustments/Labs and Tests Ordered: No orders of the defined types were placed in this encounter.  No orders of the defined types were placed in this encounter.    Signed, Joni Reining, RN  03/16/2022 11:51 AM

## 2022-03-16 NOTE — Patient Instructions (Signed)
Medication Instructions:  Your physician recommends that you continue on your current medications as directed. Please refer to the Current Medication list given to you today.  *If you need a refill on your cardiac medications before your next appointment, please call your pharmacy*   Lab Work: None.  If you have labs (blood work) drawn today and your tests are completely normal, you will receive your results only by: [place] (if you have MyChart) OR A paper copy in the mail If you have any lab test that is abnormal or we need to change your treatment, we will call you to review the results.   Testing/Procedures: You have a cardioversion scheduled for Monday, May 19, 2022.   Follow-Up: At [facility], you and your health needs are our priority.  As part of our continuing mission to provide you with exceptional heart care, we have created designated Provider Care Teams.  These Care Teams include your primary Cardiologist (physician) and Advanced Practice Providers (APPs -  Physician Assistants and Nurse Practitioners) who all work together to provide you with the care you need, when you need it.  We recommend signing up for the patient portal called "MyChart".  Sign up information is provided on this After Visit Summary.  MyChart is used to connect with patients for Virtual Visits (Telemedicine).  Patients are able to view lab/test results, encounter notes, upcoming appointments, etc.  Non-urgent messages can be sent to your provider as well.   To learn more about what you can do with MyChart, go to [email or web address].

## 2022-03-16 NOTE — Addendum Note (Signed)
Addended by: Eather Colas L on: 03/16/2022 02:00 PM   Modules accepted: Orders

## 2022-03-18 NOTE — Anesthesia Preprocedure Evaluation (Signed)
Anesthesia Evaluation  Patient identified by MRN, date of birth, ID band Patient awake    Reviewed: Allergy & Precautions, NPO status , Patient's Chart, lab work & pertinent test results  Airway Mallampati: II  TM Distance: >3 FB Neck ROM: Full    Dental no notable dental hx. (+) Teeth Intact, Dental Advisory Given   Pulmonary asthma , sleep apnea    Pulmonary exam normal breath sounds clear to auscultation       Cardiovascular hypertension, + CAD and + Past MI  Normal cardiovascular exam+ dysrhythmias Atrial Fibrillation  Rhythm:Regular Rate:Tachycardia  07/2021 echo 1. Left ventricular ejection fraction, by estimation, is 25 to 30%. The  left ventricle has severely decreased function. The left ventricle  demonstrates global hypokinesis. The left ventricular internal cavity size  was mildly dilated. Left ventricular  diastolic function could not be evaluated. There is dyskinesis of the left  ventricular, mid-apical inferior wall. There is hypokinesis of the left  ventricular, basal-mid inferior wall.   2. Right ventricular systolic function is mildly reduced. The right  ventricular size is moderately enlarged. There is normal pulmonary artery  systolic pressure.   3. Left atrial size was moderately dilated.   4. Right atrial size was moderately dilated.   5. The mitral valve is normal in structure. Mild mitral valve  regurgitation. No evidence of mitral stenosis.   6. The aortic valve is grossly normal. There is mild calcification of the  aortic valve. There is mild thickening of the aortic valve. Aortic valve  regurgitation is not visualized.   7. The inferior vena cava is normal in size with greater than 50%  respiratory variability, suggesting right atrial pressure of 3 mmHg.     Neuro/Psych negative neurological ROS  negative psych ROS   GI/Hepatic   Endo/Other    Renal/GU      Musculoskeletal   Abdominal   (+) + obese  Peds  Hematology   Anesthesia Other Findings All: Oxycodone  Reproductive/Obstetrics                             Anesthesia Physical Anesthesia Plan  ASA: 4  Anesthesia Plan: General   Post-op Pain Management:    Induction:   PONV Risk Score and Plan: Treatment may vary due to age or medical condition  Airway Management Planned: Mask  Additional Equipment: None  Intra-op Plan:   Post-operative Plan:   Informed Consent: I have reviewed the patients History and Physical, chart, labs and discussed the procedure including the risks, benefits and alternatives for the proposed anesthesia with the patient or authorized representative who has indicated his/her understanding and acceptance.     Dental advisory given  Plan Discussed with:   Anesthesia Plan Comments:         Anesthesia Quick Evaluation

## 2022-03-19 ENCOUNTER — Ambulatory Visit (HOSPITAL_BASED_OUTPATIENT_CLINIC_OR_DEPARTMENT_OTHER): Payer: Medicare Other | Admitting: Registered Nurse

## 2022-03-19 ENCOUNTER — Encounter (HOSPITAL_COMMUNITY): Payer: Self-pay | Admitting: Internal Medicine

## 2022-03-19 ENCOUNTER — Ambulatory Visit (HOSPITAL_COMMUNITY): Payer: Medicare Other | Admitting: Registered Nurse

## 2022-03-19 ENCOUNTER — Ambulatory Visit (HOSPITAL_COMMUNITY)
Admission: RE | Admit: 2022-03-19 | Discharge: 2022-03-19 | Disposition: A | Discharge status: Home / Self Care | Payer: Medicare Other | Attending: Internal Medicine | Admitting: Internal Medicine

## 2022-03-19 ENCOUNTER — Encounter (HOSPITAL_COMMUNITY)
Admission: RE | Disposition: A | Discharge status: Home / Self Care | Payer: Self-pay | Source: Home / Self Care | Attending: Internal Medicine

## 2022-03-19 ENCOUNTER — Other Ambulatory Visit: Payer: Self-pay

## 2022-03-19 DIAGNOSIS — I252 Old myocardial infarction: Secondary | ICD-10-CM | POA: Insufficient documentation

## 2022-03-19 DIAGNOSIS — I480 Paroxysmal atrial fibrillation: Secondary | ICD-10-CM | POA: Insufficient documentation

## 2022-03-19 DIAGNOSIS — I10 Essential (primary) hypertension: Secondary | ICD-10-CM

## 2022-03-19 DIAGNOSIS — I110 Hypertensive heart disease with heart failure: Secondary | ICD-10-CM | POA: Insufficient documentation

## 2022-03-19 DIAGNOSIS — I483 Typical atrial flutter: Secondary | ICD-10-CM | POA: Diagnosis not present

## 2022-03-19 DIAGNOSIS — I4892 Unspecified atrial flutter: Secondary | ICD-10-CM | POA: Insufficient documentation

## 2022-03-19 DIAGNOSIS — I5022 Chronic systolic (congestive) heart failure: Secondary | ICD-10-CM | POA: Diagnosis not present

## 2022-03-19 DIAGNOSIS — Z955 Presence of coronary angioplasty implant and graft: Secondary | ICD-10-CM | POA: Insufficient documentation

## 2022-03-19 DIAGNOSIS — I2510 Atherosclerotic heart disease of native coronary artery without angina pectoris: Secondary | ICD-10-CM | POA: Diagnosis not present

## 2022-03-19 DIAGNOSIS — G4733 Obstructive sleep apnea (adult) (pediatric): Secondary | ICD-10-CM | POA: Insufficient documentation

## 2022-03-19 DIAGNOSIS — Z86711 Personal history of pulmonary embolism: Secondary | ICD-10-CM | POA: Diagnosis not present

## 2022-03-19 DIAGNOSIS — J45909 Unspecified asthma, uncomplicated: Secondary | ICD-10-CM

## 2022-03-19 DIAGNOSIS — Z86718 Personal history of other venous thrombosis and embolism: Secondary | ICD-10-CM | POA: Insufficient documentation

## 2022-03-19 HISTORY — PX: CARDIOVERSION: SHX1299

## 2022-03-19 LAB — POCT I-STAT, CHEM 8
BUN: 27 mg/dL — ABNORMAL HIGH (ref 8–23)
Calcium, Ion: 1.22 mmol/L (ref 1.15–1.40)
Chloride: 104 mmol/L (ref 98–111)
Creatinine, Ser: 1.1 mg/dL (ref 0.61–1.24)
Glucose, Bld: 94 mg/dL (ref 70–99)
HCT: 46 % (ref 39.0–52.0)
Hemoglobin: 15.6 g/dL (ref 13.0–17.0)
Potassium: 4.2 mmol/L (ref 3.5–5.1)
Sodium: 140 mmol/L (ref 135–145)
TCO2: 25 mmol/L (ref 22–32)

## 2022-03-19 SURGERY — CARDIOVERSION
Anesthesia: General

## 2022-03-19 MED ORDER — PROPOFOL 10 MG/ML IV BOLUS
INTRAVENOUS | Status: DC | PRN
  Administered 2022-03-19: 60 mg via INTRAVENOUS

## 2022-03-19 MED ORDER — SODIUM CHLORIDE 0.9 % IV SOLN: INTRAVENOUS | Status: DC

## 2022-03-19 MED ORDER — LIDOCAINE 2% (20 MG/ML) 5 ML SYRINGE
INTRAMUSCULAR | Status: DC | PRN
  Administered 2022-03-19: 60 mg via INTRAVENOUS

## 2022-03-19 NOTE — Anesthesia Postprocedure Evaluation (Signed)
Anesthesia Post Note  Patient: Ryan Ward  Procedure(s) Performed: CARDIOVERSION     Patient location during evaluation: Endoscopy Anesthesia Type: General Level of consciousness: awake and alert Pain management: pain level controlled Vital Signs Assessment: post-procedure vital signs reviewed and stable Respiratory status: spontaneous breathing, nonlabored ventilation, respiratory function stable and patient connected to nasal cannula oxygen Cardiovascular status: blood pressure returned to baseline and stable Postop Assessment: no apparent nausea or vomiting Anesthetic complications: no  There were no known notable events for this encounter.  Last Vitals:  Vitals:   03/19/22 0754 03/19/22 0820  BP: 103/74 101/69  Pulse:    Resp:    Temp: (!) 36.1 C   SpO2:      Last Pain:  Vitals:   03/19/22 0820  TempSrc:   PainSc: 0-No pain                 Barnet Glasgow

## 2022-03-19 NOTE — Transfer of Care (Signed)
Immediate Anesthesia Transfer of Care Note  Patient: Ryan Ward  Procedure(s) Performed: CARDIOVERSION  Patient Location: Endoscopy Unit  Anesthesia Type:MAC  Level of Consciousness: drowsy and patient cooperative  Airway & Oxygen Therapy: Patient connected to nasal cannula oxygen  Post-op Assessment: Report given to RN and Post -op Vital signs reviewed and stable  Post vital signs: Reviewed and stable  Last Vitals:  Vitals Value Taken Time  BP    Temp    Pulse 60 03/19/22 0752  Resp 15 03/19/22 0752  SpO2 97 % 03/19/22 0752  Vitals shown include unvalidated device data.  Last Pain:  Vitals:   03/19/22 0654  TempSrc: Temporal  PainSc: 0-No pain         Complications: There were no known notable events for this encounter.

## 2022-03-19 NOTE — CV Procedure (Signed)
    Electrical Cardioversion Procedure Note Ryan Ward [id number] [birth year]  Procedure: Electrical Cardioversion Indications:  Atrial Flutter  Time Out: Verified patient identification, verified procedure,medications/allergies/relevent history reviewed, required imaging and test results available.  Performed  Procedure Details  The patient was NPO after midnight. Anesthesia was administered at the beside  by Dr.Houser with 60mg  of lidocaine and 60 mg propofol.  Cardioversion was done with synchronized biphasic defibrillation with AP pads with 200 Joules.  The patient converted to normal sinus rhythm. The patient tolerated the procedure well   IMPRESSION:  Successful cardioversion of atrial flutter    Ryan Ward A Sota Hetz 03/19/2022, 7:47 AM

## 2022-03-19 NOTE — Interval H&P Note (Signed)
History and Physical Interval Note:  03/19/2022 7:25 AM  Ryan Ward  has presented today for surgery, with the diagnosis of AFLUTTER.  The various methods of treatment have been discussed with the patient and family. After consideration of risks, benefits and other options for treatment, the patient has consented to  Procedure(s): CARDIOVERSION (N/A) as a surgical intervention.  The patient's history has been reviewed, patient examined, no change in status, stable for surgery.  I have reviewed the patient's chart and labs.  Questions were answered to the patient's satisfaction.     Salomon Ganser A Aricela Bertagnolli

## 2022-03-19 NOTE — Discharge Instructions (Signed)

## 2022-03-20 ENCOUNTER — Encounter (HOSPITAL_COMMUNITY): Payer: Self-pay | Admitting: Internal Medicine

## 2022-03-23 ENCOUNTER — Other Ambulatory Visit: Payer: Self-pay | Admitting: Cardiology

## 2022-03-23 NOTE — Telephone Encounter (Signed)
Prescription refill request for Xarelto received.  Indication: afib  Last office visit: [place], 03/16/2022 Weight: 93.3 kg  Age: 77 kg  Scr: 1.10, 03/19/2022 CrCl: 75 ml/min   Refill sent.

## 2022-04-10 NOTE — Telephone Encounter (Signed)
This encounter was created in error - please disregard.

## 2022-04-13 ENCOUNTER — Telehealth (HOSPITAL_COMMUNITY): Payer: Self-pay | Admitting: Emergency Medicine

## 2022-04-13 NOTE — Telephone Encounter (Signed)
Reaching out to patient to offer assistance regarding upcoming cardiac imaging study; pt verbalizes understanding of appt date/time, parking situation and where to check in, pre-test NPO status and medications ordered, and verified current allergies; name and call back number provided for further questions should they arise Kapena Hamme RN Navigator Cardiac [facility] [phone] office [phone] cell 

## 2022-04-16 ENCOUNTER — Ambulatory Visit (HOSPITAL_COMMUNITY)
Admission: RE | Admit: 2022-04-16 | Discharge: 2022-04-16 | Disposition: A | Discharge status: Home / Self Care | Payer: Medicare Other | Source: Ambulatory Visit | Attending: Physician Assistant | Admitting: Physician Assistant

## 2022-04-16 ENCOUNTER — Encounter (HOSPITAL_COMMUNITY): Payer: Self-pay | Admitting: Physician Assistant

## 2022-04-16 VITALS — BP 112/80 | HR 81 | Ht 68.0 in | Wt 204.0 lb

## 2022-04-16 DIAGNOSIS — Z955 Presence of coronary angioplasty implant and graft: Secondary | ICD-10-CM | POA: Insufficient documentation

## 2022-04-16 DIAGNOSIS — I252 Old myocardial infarction: Secondary | ICD-10-CM | POA: Diagnosis not present

## 2022-04-16 DIAGNOSIS — Z951 Presence of aortocoronary bypass graft: Secondary | ICD-10-CM | POA: Diagnosis not present

## 2022-04-16 DIAGNOSIS — Z7901 Long term (current) use of anticoagulants: Secondary | ICD-10-CM | POA: Diagnosis not present

## 2022-04-16 DIAGNOSIS — I2510 Atherosclerotic heart disease of native coronary artery without angina pectoris: Secondary | ICD-10-CM | POA: Diagnosis not present

## 2022-04-16 DIAGNOSIS — I5022 Chronic systolic (congestive) heart failure: Secondary | ICD-10-CM | POA: Diagnosis not present

## 2022-04-16 DIAGNOSIS — Z79899 Other long term (current) drug therapy: Secondary | ICD-10-CM | POA: Insufficient documentation

## 2022-04-16 DIAGNOSIS — I110 Hypertensive heart disease with heart failure: Secondary | ICD-10-CM | POA: Diagnosis not present

## 2022-04-16 DIAGNOSIS — E785 Hyperlipidemia, unspecified: Secondary | ICD-10-CM | POA: Diagnosis not present

## 2022-04-16 DIAGNOSIS — Z86718 Personal history of other venous thrombosis and embolism: Secondary | ICD-10-CM | POA: Diagnosis not present

## 2022-04-16 DIAGNOSIS — D6869 Other thrombophilia: Secondary | ICD-10-CM

## 2022-04-16 DIAGNOSIS — G4733 Obstructive sleep apnea (adult) (pediatric): Secondary | ICD-10-CM | POA: Diagnosis not present

## 2022-04-16 DIAGNOSIS — Z86711 Personal history of pulmonary embolism: Secondary | ICD-10-CM | POA: Insufficient documentation

## 2022-04-16 DIAGNOSIS — I483 Typical atrial flutter: Secondary | ICD-10-CM | POA: Diagnosis present

## 2022-04-16 NOTE — Progress Notes (Addendum)
Primary Care Physician: Ryan Mask, MD Primary Cardiologist: Dr Swaziland Primary Electrophysiologist: Dr Graciela Husbands Referring Physician: Dr Rikki Spearing Kuka is a 77 y.o. male with a history of CAD s/p CABG, CHF, DVT/PE, HLD, HTN, OSA, atrial fibrillation who presents for follow up in the [facility]. He had inferior STEMI 12/16 and received PCI with DES to his RCA. His procedure was complicated by cardiogenic shock, ventricular fibrillation and complete heart block. He was noted to develop atrial fibrillation. During the admission he developed right lower extremity DVT and right upper lobe PE. He was placed on Xarelto and completed a course of anticoagulation. He underwent repeat cardiac catheterization 3/16 which showed patent RCA stent with residual distal RPDA disease and proximal mid LAD disease as well as left circumflex disease. He underwent CABG x4 on 04/06/2015. His CABG was complicated by atrial fibrillation which was successfully treated with amiodarone therapy. Due to significant bradycardia on follow-up metoprolol and amiodarone were discontinued.   He was referred to Dr Graciela Husbands on 02/19/22 for ICD consideration and was incidentally found to be in atrial flutter. Patient was started on Xarelto for a CHADS2VASC score of 5. He was set up for DCCV on 03/19/22 which was initially successful.   On follow up today, patient is back in atrial flutter. He is completely asymptomatic. No bleeding issues on anticoagulation. He has a cardiac PET scan scheduled for tomorrow.   Today, he denies symptoms of palpitations, chest pain, shortness of breath, orthopnea, PND, lower extremity edema, dizziness, presyncope, syncope, snoring, daytime somnolence, bleeding, or neurologic sequela. The patient is tolerating medications without difficulties and is otherwise without complaint today.    Atrial Fibrillation Risk Factors:  he does have symptoms or diagnosis of sleep  apnea. he is not compliant with CPAP therapy. he does not have a history of rheumatic fever.   he has a BMI of Body mass index is 31.02 kg/m.[name] Filed Weights   04/16/22 1126  Weight: 92.5 kg    Family History  Problem Relation Age of Onset   Heart attack Father      Atrial Fibrillation Management history:  Previous antiarrhythmic drugs: amiodarone Previous cardioversions: 03/19/22 Previous ablations: none Anticoagulation history: Xarelto    Past Medical History:  Diagnosis Date   Asthma    VERY MILD   CAD (coronary artery disease)    a. 12/07/14 Inf STEMI/PCI: LAD 85p/m, 16m/d, LCX 80ost, RCA 30p/m, 33m/d, 100d (4.0x20 Promus Premier DES). Hosp course complicated by VF arrest, CGS, CHB req temp wire; b. 03/2015 Cath: LAD 85p/m, 95d, LCx 80ost, RCA 30-40p/m/d, RPDA 75, EF 30-35%, Nl CO; c. 04/06/2015 CABGx4 (LIMA->LAD, VG->Diag, Seq VG->prox RPDA->mid RPDA.   Chronic systolic CHF (congestive heart failure)    a. 03/2015 Echo: EF 30-35%, diff HK.   DVT (deep venous thrombosis) 12/2014   RLE   History of blood transfusion 04/2013   S/P CABG   Hyperlipidemia    Hypertension    Hypertensive heart disease    Ischemic cardiomyopathy    a. 03/2015 Echo: EF 30-35%, diff HK, mild MR, mod dil RA, PASP .   Myocardial infarction 12/2014   OSA (obstructive sleep apnea)    a. not using CPAP at home, does have machine (06/20/2017)   Paroxysmal atrial fibrillation    a. 12/2014 In setting of Inf MI->convertred spont.   Pulmonary embolism    a. 12/2014 R PT and peroneal vein DVT and RUL PE-->Xarelto x 3 mos.  Past Surgical History:  Procedure Laterality Date   CARDIAC CATHETERIZATION N/A 12/07/2014   Procedure: Coronary Stent Intervention;  Surgeon: Peter M Swaziland, MD;  Location: [facility] INVASIVE CV LAB;  Service: Cardiovascular;  Laterality: N/A;  STEMI   CARDIAC CATHETERIZATION N/A 12/07/2014   Procedure: IABP Insertion;  Surgeon: Peter M Swaziland, MD;  Location: [facility] INVASIVE CV LAB;   Service: Cardiovascular;  Laterality: N/A;   CARDIAC CATHETERIZATION N/A 12/07/2014   Procedure: Temporary Pacemaker;  Surgeon: Peter M Swaziland, MD;  Location: [facility] INVASIVE CV LAB;  Service: Cardiovascular;  Laterality: N/A;   CARDIAC CATHETERIZATION N/A 12/07/2014   Procedure: Left Heart Cath and Coronary Angiography;  Surgeon: Peter M Swaziland, MD;  Location: [facility] INVASIVE CV LAB;  Service: Cardiovascular;  Laterality: N/A;   CARDIAC CATHETERIZATION N/A 03/29/2015   Procedure: Right/Left Heart Cath and Coronary Angiography;  Surgeon: Peter M Swaziland, MD;  Location: [facility] INVASIVE CV LAB;  Service: Cardiovascular;  Laterality: N/A;   CARDIOVERSION N/A 03/19/2022   Procedure: CARDIOVERSION;  Surgeon: Christell Constant, MD;  Location: [facility] ENDOSCOPY;  Service: Cardiovascular;  Laterality: N/A;   CORONARY ARTERY BYPASS GRAFT N/A 04/06/2015   Procedure: CORONARY ARTERY BYPASS GRAFT TIMES FOUR  USING LEFT INTERNAL MAMMARY ARTERY TO THE LEFT ANTERIOR DESCENDING, BILATERAL GREATER SAPHENOUS ENDOVEIN HARVEST GRAFT TO PROXIMAL AND MID POSTERIOR DESCENDING AND DIAGONAL CORONARY ARTERIES AND PLACEMENT OR RIGHT FEMORAL A-LINE. ;  Surgeon: Delight Ovens, MD;  Location: [facility] OR;  Service: Open Heart Surgery;  Laterality: N/A;   FRACTURE SURGERY     HERNIA REPAIR     I & D EXTREMITY Right 02/25/2020   Procedure: Right hallux ulcer irrigation and excisional debridement amputation;  Surgeon: Toni Arthurs, MD;  Location: [facility] OR;  Service: Orthopedics;  Laterality: Right;    OPEN REDUCTION INTERNAL FIXATION (ORIF) PROXIMAL PHALANX Right 06/19/2017   Procedure: OPEN REDUCTION INTERNAL FIXATION (ORIF) PROXIMAL PHALANX;  Surgeon: Roby Lofts, MD;  Location: [facility] OR;  Service: Orthopedics;  Laterality: Right;   ORIF ANKLE FRACTURE Right 06/19/2017   Procedure: OPEN REDUCTION INTERNAL FIXATION (ORIF) ANKLE FRACTURE;  Surgeon: Roby Lofts, MD;  Location: [facility] OR;  Service: Orthopedics;  Laterality: Right;   ORIF ELBOW  FRACTURE Right 06/19/2017   Procedure: OPEN REDUCTION ELBOW DISLOCATION WITH RADIAL HEAD REPLACEMENT;  Surgeon: Roby Lofts, MD;  Location: [facility] OR;  Service: Orthopedics;  Laterality: Right;   TEE WITHOUT CARDIOVERSION N/A 04/06/2015   Procedure: TRANSESOPHAGEAL ECHOCARDIOGRAM (TEE);  Surgeon: Delight Ovens, MD;  Location: [facility] OR;  Service: Open Heart Surgery;  Laterality: N/A;   TONSILLECTOMY     UMBILICAL HERNIA REPAIR      Current Outpatient Medications  Medication Sig Dispense Refill   atorvastatin (LIPITOR) 80 MG tablet Take 1 tablet (80 mg total) by mouth daily. Advised patient to schedule OV for further refills (Patient taking differently: Take 40 mg by mouth daily.) 90 tablet 3   Cholecalciferol (VITAMIN D3) 5000 UNITS TABS Take 5,000 Units by mouth daily.      ENTRESTO 24-26 MG TAKE ONE TABLET BY MOUTH TWICE A DAY 60 tablet 6   furosemide (LASIX) 40 MG tablet Take 1 tablet (40 mg total) by mouth every other day. 45 tablet 2   JARDIANCE 10 MG TABS tablet TAKE ONE TABLET BY MOUTH EVERY MORNING BEFORE BREAKFAST 30 tablet 6   levothyroxine (SYNTHROID) 50 MCG tablet Take 1 tablet (50 mcg total) by mouth 2 (two) times daily. 180 tablet 1   Multiple Vitamin (  MULTIVITAMIN WITH MINERALS) TABS tablet Take 1 tablet by mouth daily.     ondansetron (ZOFRAN) 4 MG tablet Take 1 tablet (4 mg total) by mouth daily as needed for nausea or vomiting. 30 tablet 0   potassium chloride SA (KLOR-CON M20) 20 MEQ tablet Take 1 tablet (20 mEq total) by mouth every other day. 50 tablet 3   rivaroxaban (XARELTO) 20 MG TABS tablet TAKE 1 TABLET BY MOUTH DAILY WITH SUPPER 30 tablet 5   spironolactone (ALDACTONE) 25 MG tablet TAKE ONE TABLET BY MOUTH DAILY 90 tablet 3   No current facility-administered medications for this encounter.    Allergies  Allergen Reactions   Oxycodone Nausea And Vomiting    Social History   Socioeconomic History   Marital status: Widowed    Spouse name: Not on file   Number  of children: Not on file   Years of education: Not on file   Highest education level: Not on file  Occupational History   Not on file  Tobacco Use   Smoking status: Never   Smokeless tobacco: Never   Tobacco comments:    Never smoke 04/16/22  Vaping Use   Vaping Use: Never used  Substance and Sexual Activity   Alcohol use: Not Currently    Alcohol/week: 0.0 standard drinks of alcohol   Drug use: Never   Sexual activity: Yes  Other Topics Concern   Not on file  Social History Narrative   Works with [employer]   Social Determinants of Health   Financial Resource Strain: Not on file  Food Insecurity: Not on file  Transportation Needs: Not on file  Physical Activity: Not on file  Stress: Not on file  Social Connections: Not on file  Intimate Partner Violence: Not on file     ROS- All systems are reviewed and negative except as per the HPI above.  Physical Exam: Vitals:   04/16/22 1126  BP: 112/80  Pulse: 81  Weight: 92.5 kg  Height:  (1.727 m)    GEN- The patient is a well appearing male, alert and oriented x 3 today.   Head- normocephalic, atraumatic Eyes-  Sclera clear, conjunctiva pink Ears- hearing intact Oropharynx- clear Neck- supple  Lungs- Clear to ausculation bilaterally, normal work of breathing Heart- irregular rate and rhythm, no murmurs, rubs or gallops  GI- soft, NT, ND, + BS Extremities- no clubbing, cyanosis, or edema MS- no significant deformity or atrophy Skin- no rash or lesion Psych- euthymic mood, full affect Neuro- strength and sensation are intact  Wt Readings from Last 3 Encounters:  04/16/22 92.5 kg  03/19/22 93.3 kg  03/16/22 93.3 kg    EKG today demonstrates  Atrial flutter with predominately 3:1 block Vent. rate 81 BPM PR interval * ms QRS duration 96 ms QT/QTcB 416/483 ms  Echo 07/06/21 demonstrated  1. Left ventricular ejection fraction, by estimation, is 25 to 30%. The  left ventricle has severely decreased  function. The left ventricle  demonstrates global hypokinesis. The left ventricular internal cavity size  was mildly dilated. Left ventricular diastolic function could not be evaluated. There is dyskinesis of the left ventricular, mid-apical inferior wall. There is hypokinesis of the left ventricular, basal-mid inferior wall.   2. Right ventricular systolic function is mildly reduced. The right  ventricular size is moderately enlarged. There is normal pulmonary artery  systolic pressure.   3. Left atrial size was moderately dilated.   4. Right atrial size was moderately dilated.   5.  The mitral valve is normal in structure. Mild mitral valve  regurgitation. No evidence of mitral stenosis.   6. The aortic valve is grossly normal. There is mild calcification of the  aortic valve. There is mild thickening of the aortic valve. Aortic valve  regurgitation is not visualized.   7. The inferior vena cava is normal in size with greater than 50%  respiratory variability, suggesting right atrial pressure of 3 mmHg.   Comparison(s): Changes from prior study are noted. While overall EF  appears similar, there are focal hypokinetic/dyskinetic segments in the  inferior wall that are now apparent compared to prior.   Epic records are reviewed at length today.  CHA2DS2-VASc Score = 5  The patient's score is based upon: CHF History: 1 HTN History: 1 Diabetes History: 0 Stroke History: 0 Vascular Disease History: 1 Age Score: 2 Gender Score: 0       ASSESSMENT AND PLAN: 1. Atrial flutter The patient's CHA2DS2-VASc score is 5, indicating a 7.2% annual risk of stroke.   S/p DCCV 03/19/22 with quick return of his atrial flutter. He is rate controlled and asymptomatic. We discussed rate vs rhythm control today including AAD (dofetilide, amiodarone) and ablation. He would like to pursue a conservative watchful waiting strategy for now given his age and paucity of symptoms.  Continue Xarelto 20 mg  daily Has a history of symptomatic bradycardia with BB.   2. Secondary Hypercoagulable State (ICD10:  D68.69) The patient is at significant risk for stroke/thromboembolism based upon his CHA2DS2-VASc Score of 5.  Continue Rivaroxaban (Xarelto).   3. CAD S/p STEMI 2016 S/p CABG 2017 PET scan scheduled for tomorrow.   4. Obstructive sleep apnea Patient not currently on CPAP.  5. HTN Stable, no changes today.  6. Chronic HFrEF EF 25-30% PET scan scheduled for tomorrow Possible future ICD implant, seen by Dr Graciela Husbands.  Fluid status appears stable.  GDMT per primary cardiology team.    Follow up with Dr Swaziland as scheduled. Dr Graciela Husbands pending PET scan results.    Jorja Loa PA-C [facility] [address] [place], [place] [address] [phone] 04/16/2022 4:30 PM

## 2022-04-17 ENCOUNTER — Encounter (HOSPITAL_COMMUNITY)
Admission: RE | Admit: 2022-04-17 | Discharge: 2022-04-17 | Disposition: A | Discharge status: Home / Self Care | Payer: Medicare Other | Source: Ambulatory Visit | Attending: Cardiology | Admitting: Cardiology

## 2022-04-17 DIAGNOSIS — I255 Ischemic cardiomyopathy: Secondary | ICD-10-CM | POA: Insufficient documentation

## 2022-04-17 DIAGNOSIS — R931 Abnormal findings on diagnostic imaging of heart and coronary circulation: Secondary | ICD-10-CM | POA: Diagnosis present

## 2022-04-17 LAB — NM PET CT CARDIAC PERFUSION MULTI W/ABSOLUTE BLOODFLOW
MBFR: 1.82
Nuc Rest EF: 32 %
Nuc Stress EF: 39 %
Rest MBF: 0.62 ml/g/min
Rest Nuclear Isotope Dose: 23.9 mCi
ST Depression (mm): 0 mm
Stress MBF: 1.13 ml/g/min
Stress Nuclear Isotope Dose: 23.9 mCi

## 2022-04-17 MED ORDER — REGADENOSON 0.4 MG/5ML IV SOLN
INTRAVENOUS | Status: AC
  Filled 2022-04-17: qty 5

## 2022-04-17 MED ORDER — REGADENOSON 0.4 MG/5ML IV SOLN: 0.4000 mg | Freq: Once | INTRAVENOUS | Status: DC

## 2022-04-17 MED ORDER — RUBIDIUM RB82 GENERATOR (RUBYFILL)
23.9000 | PACK | Freq: Once | INTRAVENOUS | Status: AC
  Administered 2022-04-17: 23.9 via INTRAVENOUS

## 2022-05-03 ENCOUNTER — Telehealth: Payer: Self-pay | Admitting: *Deleted

## 2022-05-03 NOTE — Telephone Encounter (Signed)
   Pre-operative Risk Assessment    Patient Name: Ryan Ward  DOB: 08-14-1945 MRN: [id number]    NOTES ON FORM: IN ORDER TO PROCEED, WE ARE REQUESTING MEDICAL CLEARANCE BASED ON: PT. WITH HISTORY OF A. FIB, RECENT CARDIOVERSION (REPORTS CONVERTED BACK TO A. FIB), CHF, AND CABG. PLEASE ADVISE CARDIAC RISK STRATIFICATION TO PROCEED WITH CATARACT SURGERY.   Request for Surgical Clearance    Procedure:   CATARACT EXTRACTION BY PE, IOL-RIGHT EYE  Date of Surgery:  Clearance 05/16/22                                 Surgeon:  DR. Francee Piccolo DeLMONTE Surgeon's Group or Practice Name:  [facility]  Phone number:  [phone] Fax number:  [phone]   Type of Clearance Requested:   - Medical ;NO MEDICATIONS ARE NEEDING TO BE HELD INCLUDING BLOOD THINNERS   Type of Anesthesia:   IV SEDATION   Additional requests/questions:    Elpidio Anis   05/03/2022, 10:28 AM

## 2022-05-03 NOTE — Telephone Encounter (Signed)
   Patient Name: Ryan Ward  DOB: 1945-09-02 MRN: [id number]  Primary Cardiologist: Peter Swaziland, MD  Chart reviewed as part of pre-operative protocol coverage. Cataract extractions are recognized in guidelines as low risk surgeries that do not typically require specific preoperative testing or holding of blood thinner therapy. Therefore, given past medical history and time since last visit, based on ACC/AHA guidelines, Ryan Ward would be at acceptable risk for the planned procedure without further cardiovascular testing.   I will route this recommendation to the requesting party via Epic fax function and remove from pre-op pool.  Please call with questions.  Ronney Asters, NP 05/03/2022, 11:08 AM

## 2022-05-22 ENCOUNTER — Other Ambulatory Visit: Payer: Self-pay | Admitting: Cardiology

## 2022-06-19 ENCOUNTER — Other Ambulatory Visit: Payer: Self-pay | Admitting: Cardiology

## 2022-06-27 NOTE — Progress Notes (Signed)
Cardiology Office Note:    Date:  06/29/2022   ID:  Ryan Ward, DOB [birth year], MRN [id number]  PCP:  Kaleen Mask, MD   [facility] Providers Cardiologist:  Drevin Ortner [place], MD Electrophysiologist:  Sherryl Manges, MD     Referring MD: Kaleen Mask, *   Chief Complaint  Patient presents with   Atrial Flutter   Congestive Heart Failure   Coronary Artery Disease    History of Present Illness:    Ryan Ward is a 77 y.o. male with a hx of CAD s/p CABG, chronic systolic HF, h/o RLE DVT/PE, HTN, HLD, OSA and PAF.  Patient had a history of inferior ST elevated MI in December 2016.  Cardiac catheterization showed occluded RCA and severe LAD and circumflex disease.  RCA was successfully treated with drug-eluting stent.  Periprocedural course complicated by cardiogenic shock, ventricular fibrillation and a complete heart block.  He also developed atrial fibrillation.  During the same admission, he developed right lower extremity DVT and the right upper lobe pulmonary emboli.  He was placed on Xarelto and completed the course of anticoagulation.  He underwent relook cardiac catheterization in March 2016 which showed patency of the RCA stent with a residual distal RPDA disease as well as proximal to mid LAD disease and also left circumflex disease.  He underwent successful CABG x4 (LIMA to LAD, SVG to diagonal, sequential SVG to proximal RPDA and the mid RPDA).  Postop course was complicated by atrial fibrillation that was treated successfully with amiodarone therapy.  After discharge, he developed heart failure and was treated with increased dose of Lasix.  Due to significant bradycardia on follow-up, metoprolol and amiodarone were discontinued.  Follow-up echo in July 2017 showed EF improved to 35 to 40%.  ICD was deferred.  Event monitor did not show any recurrence of atrial fibrillation as well.   In Feb 2022 he had osteomyelitis of the right hallux and underwent  amputation.  He has since been seen on several occasions by Edd Fabian NP for titration of heart failure therapy. Repeat Echo in July 2023 showed EF 25-30%. He was referred to EP for consideration of ICD. When seen by Dr Graciela Husbands in Feb 2024 was noted to be in Atrial flutter. Was anticoagulated and underwent DCCV on 03/19/22. When seen back in Afib clinic in April had early return of Atrial flutter. Rate was controlled. Discussion had concerning AAD/ablation but patient favored continued therapy with rate control. He did have a PET CT which showed evidence of old inferior infarct. No ischemia. EF 32%.   On follow up today he is feeling well. Denies any chest pain, palpitations, dyspnea. Edema is unchanged. He may get lightheaded if he bends over and stands up quickly.    Past Medical History:  Diagnosis Date   Asthma    VERY MILD   CAD (coronary artery disease)    a. 12/07/14 Inf STEMI/PCI: LAD 85p/m, 77m/d, LCX 80ost, RCA 30p/m, 57m/d, 100d (4.0x20 Promus Premier DES). Hosp course complicated by VF arrest, CGS, CHB req temp wire; b. 03/2015 Cath: LAD 85p/m, 95d, LCx 80ost, RCA 30-40p/m/d, RPDA 75, EF 30-35%, Nl CO; c. 04/06/2015 CABGx4 (LIMA->LAD, VG->Diag, Seq VG->prox RPDA->mid RPDA.   Chronic systolic CHF (congestive heart failure) (HCC)    a. 03/2015 Echo: EF 30-35%, diff HK.   DVT (deep venous thrombosis) (HCC) 12/2014   RLE   History of blood transfusion 04/2013   S/P CABG   Hyperlipidemia    Hypertension  Hypertensive heart disease    Ischemic cardiomyopathy    a. 03/2015 Echo: EF 30-35%, diff HK, mild MR, mod dil RA, PASP .   Myocardial infarction (HCC) 12/2014   OSA (obstructive sleep apnea)    a. not using CPAP at home, does have machine (06/20/2017)   Paroxysmal atrial fibrillation (HCC)    a. 12/2014 In setting of Inf MI->convertred spont.   Pulmonary embolism (HCC)    a. 12/2014 R PT and peroneal vein DVT and RUL PE-->Xarelto x 3 mos.    Past Surgical History:   Procedure Laterality Date   CARDIAC CATHETERIZATION N/A 12/07/2014   Procedure: Coronary Stent Intervention;  Surgeon: Laiah Pouncey M [place], MD;  Location: [facility] INVASIVE CV LAB;  Service: Cardiovascular;  Laterality: N/A;  STEMI   CARDIAC CATHETERIZATION N/A 12/07/2014   Procedure: IABP Insertion;  Surgeon: Jayro Mcmath M [place], MD;  Location: [facility] INVASIVE CV LAB;  Service: Cardiovascular;  Laterality: N/A;   CARDIAC CATHETERIZATION N/A 12/07/2014   Procedure: Temporary Pacemaker;  Surgeon: Grier Czerwinski M [place], MD;  Location: [facility] INVASIVE CV LAB;  Service: Cardiovascular;  Laterality: N/A;   CARDIAC CATHETERIZATION N/A 12/07/2014   Procedure: Left Heart Cath and Coronary Angiography;  Surgeon: Jesse Hirst M [place], MD;  Location: [facility] INVASIVE CV LAB;  Service: Cardiovascular;  Laterality: N/A;   CARDIAC CATHETERIZATION N/A 03/29/2015   Procedure: Right/Left Heart Cath and Coronary Angiography;  Surgeon: Braylyn Kalter M [place], MD;  Location: [facility] INVASIVE CV LAB;  Service: Cardiovascular;  Laterality: N/A;   CARDIOVERSION N/A 03/19/2022   Procedure: CARDIOVERSION;  Surgeon: Christell Constant, MD;  Location: [facility] ENDOSCOPY;  Service: Cardiovascular;  Laterality: N/A;   CORONARY ARTERY BYPASS GRAFT N/A 04/06/2015   Procedure: CORONARY ARTERY BYPASS GRAFT TIMES FOUR  USING LEFT INTERNAL MAMMARY ARTERY TO THE LEFT ANTERIOR DESCENDING, BILATERAL GREATER SAPHENOUS ENDOVEIN HARVEST GRAFT TO PROXIMAL AND MID POSTERIOR DESCENDING AND DIAGONAL CORONARY ARTERIES AND PLACEMENT OR RIGHT FEMORAL A-LINE. ;  Surgeon: Delight Ovens, MD;  Location: [facility] OR;  Service: Open Heart Surgery;  Laterality: N/A;   FRACTURE SURGERY     HERNIA REPAIR     I & D EXTREMITY Right 02/25/2020   Procedure: Right hallux ulcer irrigation and excisional debridement amputation;  Surgeon: Toni Arthurs, MD;  Location: [facility] OR;  Service: Orthopedics;  Laterality: Right;    OPEN REDUCTION INTERNAL FIXATION (ORIF) PROXIMAL PHALANX Right 06/19/2017   Procedure: OPEN REDUCTION  INTERNAL FIXATION (ORIF) PROXIMAL PHALANX;  Surgeon: Roby Lofts, MD;  Location: [facility] OR;  Service: Orthopedics;  Laterality: Right;   ORIF ANKLE FRACTURE Right 06/19/2017   Procedure: OPEN REDUCTION INTERNAL FIXATION (ORIF) ANKLE FRACTURE;  Surgeon: Roby Lofts, MD;  Location: [facility] OR;  Service: Orthopedics;  Laterality: Right;   ORIF ELBOW FRACTURE Right 06/19/2017   Procedure: OPEN REDUCTION ELBOW DISLOCATION WITH RADIAL HEAD REPLACEMENT;  Surgeon: Roby Lofts, MD;  Location: [facility] OR;  Service: Orthopedics;  Laterality: Right;   TEE WITHOUT CARDIOVERSION N/A 04/06/2015   Procedure: TRANSESOPHAGEAL ECHOCARDIOGRAM (TEE);  Surgeon: Delight Ovens, MD;  Location: [facility] OR;  Service: Open Heart Surgery;  Laterality: N/A;   TONSILLECTOMY     UMBILICAL HERNIA REPAIR      Current Medications: Current Meds  Medication Sig   Cholecalciferol (VITAMIN D3) 5000 UNITS TABS Take 5,000 Units by mouth daily.    empagliflozin (JARDIANCE) 10 MG TABS tablet TAKE ONE TABLET BY MOUTH EVERY MORNING BEFORE BREAKFAST   furosemide (LASIX) 40 MG tablet Take 1 tablet (40 mg  total) by mouth every other day.   levothyroxine (SYNTHROID) 50 MCG tablet Take 1 tablet (50 mcg total) by mouth 2 (two) times daily.   Multiple Vitamin (MULTIVITAMIN WITH MINERALS) TABS tablet Take 1 tablet by mouth daily.   ondansetron (ZOFRAN) 4 MG tablet Take 1 tablet (4 mg total) by mouth daily as needed for nausea or vomiting.   rivaroxaban (XARELTO) 20 MG TABS tablet TAKE 1 TABLET BY MOUTH DAILY WITH SUPPER   sacubitril-valsartan (ENTRESTO) 24-26 MG TAKE 1 TABLET BY MOUTH 2 TIMES A DAY   spironolactone (ALDACTONE) 25 MG tablet TAKE ONE TABLET BY MOUTH DAILY   [DISCONTINUED] atorvastatin (LIPITOR) 80 MG tablet Take 1 tablet (80 mg total) by mouth daily. Advised patient to schedule OV for further refills (Patient taking differently: Take 40 mg by mouth daily.)   [DISCONTINUED] potassium chloride SA (KLOR-CON M20) 20 MEQ tablet Take 1 tablet  (20 mEq total) by mouth every other day.     Allergies:   Oxycodone   Social History   Socioeconomic History   Marital status: Widowed    Spouse name: Not on file   Number of children: Not on file   Years of education: Not on file   Highest education level: Not on file  Occupational History   Not on file  Tobacco Use   Smoking status: Never   Smokeless tobacco: Never   Tobacco comments:    Never smoke 04/16/22  Vaping Use   Vaping Use: Never used  Substance and Sexual Activity   Alcohol use: Not Currently    Alcohol/week: 0.0 standard drinks of alcohol   Drug use: Never   Sexual activity: Yes  Other Topics Concern   Not on file  Social History Narrative   Works with [employer]   Social Determinants of Health   Financial Resource Strain: Not on file  Food Insecurity: Not on file  Transportation Needs: Not on file  Physical Activity: Not on file  Stress: Not on file  Social Connections: Not on file     Family History: The patient's family history includes Heart attack in his father.  ROS:   Please see the history of present illness.     All other systems reviewed and are negative.  EKGs/Labs/Other Studies Reviewed:    The following studies were reviewed today: Echo 07/06/21: IMPRESSIONS     1. Left ventricular ejection fraction, by estimation, is 25 to 30%. The  left ventricle has severely decreased function. The left ventricle  demonstrates global hypokinesis. The left ventricular internal cavity size  was mildly dilated. Left ventricular  diastolic function could not be evaluated. There is dyskinesis of the left  ventricular, mid-apical inferior wall. There is hypokinesis of the left  ventricular, basal-mid inferior wall.   2. Right ventricular systolic function is mildly reduced. The right  ventricular size is moderately enlarged. There is normal pulmonary artery  systolic pressure.   3. Left atrial size was moderately dilated.   4. Right atrial size  was moderately dilated.   5. The mitral valve is normal in structure. Mild mitral valve  regurgitation. No evidence of mitral stenosis.   6. The aortic valve is grossly normal. There is mild calcification of the  aortic valve. There is mild thickening of the aortic valve. Aortic valve  regurgitation is not visualized.   7. The inferior vena cava is normal in size with greater than 50%  respiratory variability, suggesting right atrial pressure of 3 mmHg.   Comparison(s): Changes  from prior study are noted. While overall EF  appears similar, there are focal hypokinetic/dyskinetic segments in the  inferior wall that are now apparent compared to prior.   PET CT 04/17/22:  Narrative & Impression      Severe fixed inferior/inferoseptal/inferolateral perfusion defect consistent with infarct.  No ischemia.  Moderate systolic dysfunction (EF 32% at rest, 39% during stress).  Myocardial blood flows are not reliable given history of CABG.  Overall, intermediate risk study.   LV perfusion is abnormal. There is no evidence of ischemia. There is evidence of infarction. Defect 1: There is a large defect with severe reduction in uptake present in the apical to basal inferior, inferolateral and inferoseptal location(s) that is fixed. There is abnormal wall motion in the defect area. Consistent with infarction. Defect 2: There is a small defect with mild reduction in uptake present in the apex location(s) that is fixed. There is abnormal wall motion in the defect area. Consistent with infarction.   Rest left ventricular function is abnormal. Rest global function is moderately reduced. There were multiple regional abnormalities. Rest EF: 32 %. Stress left ventricular function is abnormal. Stress global function is moderately reduced. There were multiple regional abnormalities. Stress EF: 39 %. End diastolic cavity size is normal. End systolic cavity size is normal.   Myocardial blood flow was computed to be  0.56ml/g/min at rest and 1.65ml/g/min at stress. Global myocardial blood flow reserve was 1.82 and was abnormal.  Howevere flows are not reliable in setting of prior CABG   Coronary calcium was present on the attenuation correction CT images. Severe coronary calcifications were present. Coronary calcifications were present in the left anterior descending artery, left circumflex artery and right coronary artery distribution(s).   Findings are consistent with infarction. The study is intermediate risk.   Electronically signed by Epifanio Lesches, MD   CLINICAL DATA:  This over-read does not include interpretation of cardiac or coronary anatomy or pathology. The cardiac PET interpretation by the cardiologist is attached.   COMPARISON:  None Available.   FINDINGS: Atherosclerotic calcifications in the thoracic aorta. Status post median sternotomy for CABG including LIMA to the LAD. Within the visualized portions of the thorax there are no suspicious appearing pulmonary nodules or masses, there is no acute consolidative airspace disease, no pleural effusions, no pneumothorax and no lymphadenopathy. Visualized portions of the upper abdomen are unremarkable. There are no aggressive appearing lytic or blastic lesions noted in the visualized portions of the skeleton.   IMPRESSION: 1.  Aortic Atherosclerosis (ICD10-I70.0).     Electronically Signed        Recent Labs: 12/15/2021: ALT 19 03/19/2022: BUN 27; Creatinine, Ser 1.10; Hemoglobin 15.6; Potassium 4.2; Sodium 140  Recent Lipid Panel    Component Value Date/Time   CHOL 121 12/15/2021 1045   TRIG 45 12/15/2021 1045   HDL 51 12/15/2021 1045   CHOLHDL 2.4 12/15/2021 1045   CHOLHDL 2.3 10/10/2015 1428   VLDL 9 10/10/2015 1428   LDLCALC 59 12/15/2021 1045     Risk Assessment/Calculations:    CHA2DS2-VASc Score = 5   This indicates a 7.2% annual risk of stroke. The patient's score is based upon: CHF History: 1 HTN History:  1 Diabetes History: 0 Stroke History: 0 Vascular Disease History: 1 Age Score: 2 Gender Score: 0               Physical Exam:    VS:  BP 104/73 (BP Location: Right Arm, Patient Position: Sitting, Cuff Size: Normal)  Pulse 89   Ht 5\' 8"  (1.727 m)   Wt 205 lb 12.8 oz (93.4 kg)   SpO2 93%   BMI 31.29 kg/m     Wt Readings from Last 3 Encounters:  06/29/22 205 lb 12.8 oz (93.4 kg)  04/16/22 204 lb (92.5 kg)  03/19/22 205 lb 11 oz (93.3 kg)     GEN:  Well nourished, well developed in no acute distress HEENT: Normal NECK: No JVD; No carotid bruits LYMPHATICS: No lymphadenopathy CARDIAC: RRR, no murmurs, rubs, gallops RESPIRATORY:  Clear to auscultation without rales, wheezing or rhonchi  ABDOMEN: Soft, non-tender, non-distended MUSCULOSKELETAL:  No edema; No deformity  SKIN: Warm and dry NEUROLOGIC:  Alert and oriented x 3 PSYCHIATRIC:  Normal affect   ASSESSMENT:    1. Chronic systolic heart failure (HCC)   2. Hypokalemia   3. Coronary artery disease of bypass graft of native heart with stable angina pectoris (HCC)   4. Typical atrial flutter (HCC)   5. Hyperlipidemia LDL goal <70    PLAN:    In order of problems listed above:  CAD s/p CABG: Continue aspirin and statin.  He has no significant anginal symptoms. PET scan with no ischemia. Old inferior infarct.   Chronic systolic heart failure: Euvolemic on physical exam. Echo with EF 25-30%. 32% by PET CT. He is on Entresto, aldactone, Jardiance, lasix. No beta blocker given history of bradycardia. I doubt he would tolerate higher Entresto dose given soft BP. Will continue. We discussed consideration for ICD given ischemic CM. He would like to defer now.   Hypertension: Blood pressure controlled  Hyperlipidemia: Continue Lipitor. LDL 68 at goal  PAF: Occurred in the postop setting.  Now with  persistent atrial flutter. Had DCCV with early return of flutter. Plan rate control strategy and anticoagulation. He is  asymptomatic   Hypothyroidism - on Synthroid.           Medication Adjustments/Labs and Tests Ordered: Current medicines are reviewed at length with the patient today.  Concerns regarding medicines are outlined above.  No orders of the defined types were placed in this encounter.  Meds ordered this encounter  Medications   atorvastatin (LIPITOR) 80 MG tablet    Sig: Take 0.5 tablets (40 mg total) by mouth daily.    Dispense:  45 tablet    Refill:  3    PLEASE SCHEDULE AN APPT FOR FUTURE REFILLS   potassium chloride SA (KLOR-CON M20) 20 MEQ tablet    Sig: Take 0.5 tablets (10 mEq total) by mouth every other day.    Dispense:  50 tablet    Refill:  3    Patient Instructions  Medication Instructions:  Continue same medications *If you need a refill on your cardiac medications before your next appointment, please call your pharmacy*   Lab Work: None ordered   Testing/Procedures: None ordered   Follow-Up: At [facility], you and your health needs are our priority.  As part of our continuing mission to provide you with exceptional heart care, we have created designated Provider Care Teams.  These Care Teams include your primary Cardiologist (physician) and Advanced Practice Providers (APPs -  Physician Assistants and Nurse Practitioners) who all work together to provide you with the care you need, when you need it.  We recommend signing up for the patient portal called "MyChart".  Sign up information is provided on this After Visit Summary.  MyChart is used to connect with patients for Virtual Visits (Telemedicine).  Patients are able to view lab/test results, encounter notes, upcoming appointments, etc.  Non-urgent messages can be sent to your provider as well.   To learn more about what you can do with MyChart, go to [email or web address].    Your next appointment:  6 months    Call in Sept to schedule Jan appointment     Provider:  Dr.Annalese Stiner     Signed, Myracle Febres [place], MD  06/29/2022 9:31 AM    [facility]

## 2022-06-29 ENCOUNTER — Ambulatory Visit: Payer: Medicare Other | Attending: Cardiology | Admitting: Cardiology

## 2022-06-29 ENCOUNTER — Encounter: Payer: Self-pay | Admitting: Cardiology

## 2022-06-29 VITALS — BP 104/73 | HR 89 | Ht 68.0 in | Wt 205.8 lb

## 2022-06-29 DIAGNOSIS — I25708 Atherosclerosis of coronary artery bypass graft(s), unspecified, with other forms of angina pectoris: Secondary | ICD-10-CM | POA: Diagnosis present

## 2022-06-29 DIAGNOSIS — E785 Hyperlipidemia, unspecified: Secondary | ICD-10-CM | POA: Insufficient documentation

## 2022-06-29 DIAGNOSIS — I5022 Chronic systolic (congestive) heart failure: Secondary | ICD-10-CM | POA: Diagnosis present

## 2022-06-29 DIAGNOSIS — I483 Typical atrial flutter: Secondary | ICD-10-CM | POA: Diagnosis present

## 2022-06-29 DIAGNOSIS — E876 Hypokalemia: Secondary | ICD-10-CM | POA: Insufficient documentation

## 2022-06-29 MED ORDER — POTASSIUM CHLORIDE CRYS ER 20 MEQ PO TBCR: 10.0000 meq | EXTENDED_RELEASE_TABLET | ORAL | 3 refills | Status: DC

## 2022-06-29 MED ORDER — ATORVASTATIN CALCIUM 80 MG PO TABS: 40.0000 mg | ORAL_TABLET | Freq: Every day | ORAL | 3 refills | Status: DC

## 2022-06-29 NOTE — Patient Instructions (Signed)
Medication Instructions:  Continue same medications *If you need a refill on your cardiac medications before your next appointment, please call your pharmacy*   Lab Work: None ordered   Testing/Procedures: None ordered   Follow-Up: At [facility], you and your health needs are our priority.  As part of our continuing mission to provide you with exceptional heart care, we have created designated Provider Care Teams.  These Care Teams include your primary Cardiologist (physician) and Advanced Practice Providers (APPs -  Physician Assistants and Nurse Practitioners) who all work together to provide you with the care you need, when you need it.  We recommend signing up for the patient portal called "MyChart".  Sign up information is provided on this After Visit Summary.  MyChart is used to connect with patients for Virtual Visits (Telemedicine).  Patients are able to view lab/test results, encounter notes, upcoming appointments, etc.  Non-urgent messages can be sent to your provider as well.   To learn more about what you can do with MyChart, go to [email or web address].    Your next appointment:  6 months    Call in Sept to schedule Jan appointment     Provider:  Dr.Jordan   

## 2022-07-12 ENCOUNTER — Other Ambulatory Visit: Payer: Self-pay | Admitting: Cardiology

## 2022-07-26 ENCOUNTER — Other Ambulatory Visit: Payer: Self-pay | Admitting: Cardiology

## 2022-07-26 NOTE — Telephone Encounter (Signed)
Pt's pharmacy is requesting a refill on levothyroxine. Would Dr. [place] like to refill this medication? Please address

## 2022-09-19 ENCOUNTER — Other Ambulatory Visit: Payer: Self-pay | Admitting: Cardiology

## 2022-09-19 NOTE — Telephone Encounter (Signed)
Prescription refill request for Xarelto received.   Indication: afib  Last office visit:Ryan Ward, 06/29/2022 Weight: 93.4 kg  Age: 77 yo  Scr: 1.10, 03/19/2022 CrCl: 74 ml/min   Refill sent.

## 2022-09-24 ENCOUNTER — Other Ambulatory Visit: Payer: Self-pay | Admitting: Cardiology

## 2022-10-07 ENCOUNTER — Other Ambulatory Visit: Payer: Self-pay | Admitting: Cardiology

## 2022-12-20 NOTE — Progress Notes (Signed)
Cardiology Office Note:    Date:  12/27/2022   ID:  Ryan Ward, DOB 10-28-1945, MRN [id number]  PCP:  Kaleen Mask, MD   [facility] Providers Cardiologist:  Jibri Schriefer [place], MD Electrophysiologist:  Sherryl Manges, MD     Referring MD: Kaleen Mask, *   Chief Complaint  Patient presents with   Atrial Fibrillation    History of Present Illness:    Ryan Ward is a 77 y.o. male with a hx of CAD s/p CABG, chronic systolic HF, h/o RLE DVT/PE, HTN, HLD, OSA and PAF.  Patient had a history of inferior ST elevated MI in December 2016.  Cardiac catheterization showed occluded RCA and severe LAD and circumflex disease.  RCA was successfully treated with drug-eluting stent.  Periprocedural course complicated by cardiogenic shock, ventricular fibrillation and a complete heart block.  He also developed atrial fibrillation.  During the same admission, he developed right lower extremity DVT and the right upper lobe pulmonary emboli.  He was placed on Xarelto and completed the course of anticoagulation.  He underwent relook cardiac catheterization in March 2016 which showed patency of the RCA stent with a residual distal RPDA disease as well as proximal to mid LAD disease and also left circumflex disease.  He underwent successful CABG x4 (LIMA to LAD, SVG to diagonal, sequential SVG to proximal RPDA and the mid RPDA).  Postop course was complicated by atrial fibrillation that was treated successfully with amiodarone therapy.  After discharge, he developed heart failure and was treated with increased dose of Lasix.  Due to significant bradycardia on follow-up, metoprolol and amiodarone were discontinued.  Follow-up echo in July 2017 showed EF improved to 35 to 40%.  ICD was deferred.  Event monitor did not show any recurrence of atrial fibrillation as well.   In Feb 2022 he had osteomyelitis of the right hallux and underwent amputation.  He has since been seen on several  occasions by Edd Fabian NP for titration of heart failure therapy. Repeat Echo in July 2023 showed EF 25-30%. He was referred to EP for consideration of ICD. When seen by Dr Graciela Husbands in Feb 2024 was noted to be in Atrial flutter. Was anticoagulated and underwent DCCV on 03/19/22. When seen back in Afib clinic in April had early return of Atrial flutter. Rate was controlled. Discussion had concerning AAD/ablation but patient favored continued therapy with rate control. He did have a PET CT which showed evidence of old inferior infarct. No ischemia. EF 32%.   On follow up today he is feeling well. Denies any chest pain, palpitations, dyspnea. Edema is unchanged. No significant dizziness. Overall feels well.   Past Medical History:  Diagnosis Date   Asthma    VERY MILD   CAD (coronary artery disease)    a. 12/07/14 Inf STEMI/PCI: LAD 85p/m, 54m/d, LCX 80ost, RCA 30p/m, 62m/d, 100d (4.0x20 Promus Premier DES). Hosp course complicated by VF arrest, CGS, CHB req temp wire; b. 03/2015 Cath: LAD 85p/m, 95d, LCx 80ost, RCA 30-40p/m/d, RPDA 75, EF 30-35%, Nl CO; c. 04/06/2015 CABGx4 (LIMA->LAD, VG->Diag, Seq VG->prox RPDA->mid RPDA.   Chronic systolic CHF (congestive heart failure) (HCC)    a. 03/2015 Echo: EF 30-35%, diff HK.   DVT (deep venous thrombosis) (HCC) 12/2014   RLE   History of blood transfusion 04/2013   S/P CABG   Hyperlipidemia    Hypertension    Hypertensive heart disease    Ischemic cardiomyopathy    a. 03/2015 Echo: EF 30-35%,  diff HK, mild MR, mod dil RA, PASP .   Myocardial infarction (HCC) 12/2014   OSA (obstructive sleep apnea)    a. not using CPAP at home, does have machine (06/20/2017)   Paroxysmal atrial fibrillation (HCC)    a. 12/2014 In setting of Inf MI->convertred spont.   Pulmonary embolism (HCC)    a. 12/2014 R PT and peroneal vein DVT and RUL PE-->Xarelto x 3 mos.    Past Surgical History:  Procedure Laterality Date   CARDIAC CATHETERIZATION N/A 12/07/2014    Procedure: Coronary Stent Intervention;  Surgeon: Brayen Bunn M [place], MD;  Location: [facility] INVASIVE CV LAB;  Service: Cardiovascular;  Laterality: N/A;  STEMI   CARDIAC CATHETERIZATION N/A 12/07/2014   Procedure: IABP Insertion;  Surgeon: Liany Mumpower M [place], MD;  Location: [facility] INVASIVE CV LAB;  Service: Cardiovascular;  Laterality: N/A;   CARDIAC CATHETERIZATION N/A 12/07/2014   Procedure: Temporary Pacemaker;  Surgeon: Aalina Brege M [place], MD;  Location: [facility] INVASIVE CV LAB;  Service: Cardiovascular;  Laterality: N/A;   CARDIAC CATHETERIZATION N/A 12/07/2014   Procedure: Left Heart Cath and Coronary Angiography;  Surgeon: Dayane Hillenburg M [place], MD;  Location: [facility] INVASIVE CV LAB;  Service: Cardiovascular;  Laterality: N/A;   CARDIAC CATHETERIZATION N/A 03/29/2015   Procedure: Right/Left Heart Cath and Coronary Angiography;  Surgeon: Shawnay Bramel M [place], MD;  Location: [facility] INVASIVE CV LAB;  Service: Cardiovascular;  Laterality: N/A;   CARDIOVERSION N/A 03/19/2022   Procedure: CARDIOVERSION;  Surgeon: Christell Constant, MD;  Location: [facility] ENDOSCOPY;  Service: Cardiovascular;  Laterality: N/A;   CORONARY ARTERY BYPASS GRAFT N/A 04/06/2015   Procedure: CORONARY ARTERY BYPASS GRAFT TIMES FOUR  USING LEFT INTERNAL MAMMARY ARTERY TO THE LEFT ANTERIOR DESCENDING, BILATERAL GREATER SAPHENOUS ENDOVEIN HARVEST GRAFT TO PROXIMAL AND MID POSTERIOR DESCENDING AND DIAGONAL CORONARY ARTERIES AND PLACEMENT OR RIGHT FEMORAL A-LINE. ;  Surgeon: Delight Ovens, MD;  Location: [facility] OR;  Service: Open Heart Surgery;  Laterality: N/A;   FRACTURE SURGERY     HERNIA REPAIR     I & D EXTREMITY Right 02/25/2020   Procedure: Right hallux ulcer irrigation and excisional debridement amputation;  Surgeon: Toni Arthurs, MD;  Location: [facility] OR;  Service: Orthopedics;  Laterality: Right;    OPEN REDUCTION INTERNAL FIXATION (ORIF) PROXIMAL PHALANX Right 06/19/2017   Procedure: OPEN REDUCTION INTERNAL FIXATION (ORIF) PROXIMAL PHALANX;  Surgeon: Roby Lofts,  MD;  Location: [facility] OR;  Service: Orthopedics;  Laterality: Right;   ORIF ANKLE FRACTURE Right 06/19/2017   Procedure: OPEN REDUCTION INTERNAL FIXATION (ORIF) ANKLE FRACTURE;  Surgeon: Roby Lofts, MD;  Location: [facility] OR;  Service: Orthopedics;  Laterality: Right;   ORIF ELBOW FRACTURE Right 06/19/2017   Procedure: OPEN REDUCTION ELBOW DISLOCATION WITH RADIAL HEAD REPLACEMENT;  Surgeon: Roby Lofts, MD;  Location: [facility] OR;  Service: Orthopedics;  Laterality: Right;   TEE WITHOUT CARDIOVERSION N/A 04/06/2015   Procedure: TRANSESOPHAGEAL ECHOCARDIOGRAM (TEE);  Surgeon: Delight Ovens, MD;  Location: [facility] OR;  Service: Open Heart Surgery;  Laterality: N/A;   TONSILLECTOMY     UMBILICAL HERNIA REPAIR      Current Medications: Current Meds  Medication Sig   atorvastatin (LIPITOR) 80 MG tablet Take 0.5 tablets (40 mg total) by mouth daily.   Cholecalciferol (VITAMIN D3) 5000 UNITS TABS Take 5,000 Units by mouth daily.    empagliflozin (JARDIANCE) 10 MG TABS tablet TAKE ONE TABLET BY MOUTH EVERY MORNING BEFORE BREAKFAST   furosemide (LASIX) 40 MG tablet Take 1 tablet (40 mg  total) by mouth every other day.   levothyroxine (SYNTHROID) 50 MCG tablet TAKE 1 TABLET BY MOUTH TWICE A DAY   Multiple Vitamin (MULTIVITAMIN WITH MINERALS) TABS tablet Take 1 tablet by mouth daily.   ondansetron (ZOFRAN) 4 MG tablet Take 1 tablet (4 mg total) by mouth daily as needed for nausea or vomiting.   potassium chloride SA (KLOR-CON M20) 20 MEQ tablet Take 0.5 tablets (10 mEq total) by mouth every other day.   sacubitril-valsartan (ENTRESTO) 24-26 MG TAKE 1 TABLET BY MOUTH 2 TIMES A DAY   spironolactone (ALDACTONE) 25 MG tablet TAKE 1 TABLET BY MOUTH DAILY   XARELTO 20 MG TABS tablet TAKE 1 TABLET BY MOUTH DAILY WITH SUPPER     Allergies:   Oxycodone   Social History   Socioeconomic History   Marital status: Widowed    Spouse name: Not on file   Number of children: Not on file   Years of education: Not on file    Highest education level: Not on file  Occupational History   Not on file  Tobacco Use   Smoking status: Never   Smokeless tobacco: Never   Tobacco comments:    Never smoke 04/16/22  Vaping Use   Vaping status: Never Used  Substance and Sexual Activity   Alcohol use: Not Currently    Alcohol/week: 0.0 standard drinks of alcohol   Drug use: Never   Sexual activity: Yes  Other Topics Concern   Not on file  Social History Narrative   Works with [employer]   Social Drivers of Corporate investment banker Strain: Not on file  Food Insecurity: Not on file  Transportation Needs: Not on file  Physical Activity: Not on file  Stress: Not on file  Social Connections: Not on file     Family History: The patient's family history includes Heart attack in his father.  ROS:   Please see the history of present illness.     All other systems reviewed and are negative.  EKGs/Labs/Other Studies Reviewed:    The following studies were reviewed today: Echo 07/06/21: IMPRESSIONS     1. Left ventricular ejection fraction, by estimation, is 25 to 30%. The  left ventricle has severely decreased function. The left ventricle  demonstrates global hypokinesis. The left ventricular internal cavity size  was mildly dilated. Left ventricular  diastolic function could not be evaluated. There is dyskinesis of the left  ventricular, mid-apical inferior wall. There is hypokinesis of the left  ventricular, basal-mid inferior wall.   2. Right ventricular systolic function is mildly reduced. The right  ventricular size is moderately enlarged. There is normal pulmonary artery  systolic pressure.   3. Left atrial size was moderately dilated.   4. Right atrial size was moderately dilated.   5. The mitral valve is normal in structure. Mild mitral valve  regurgitation. No evidence of mitral stenosis.   6. The aortic valve is grossly normal. There is mild calcification of the  aortic valve. There is mild  thickening of the aortic valve. Aortic valve  regurgitation is not visualized.   7. The inferior vena cava is normal in size with greater than 50%  respiratory variability, suggesting right atrial pressure of 3 mmHg.   Comparison(s): Changes from prior study are noted. While overall EF  appears similar, there are focal hypokinetic/dyskinetic segments in the  inferior wall that are now apparent compared to prior.   PET CT 04/17/22:  Narrative & Impression  Severe fixed inferior/inferoseptal/inferolateral perfusion defect consistent with infarct.  No ischemia.  Moderate systolic dysfunction (EF 32% at rest, 39% during stress).  Myocardial blood flows are not reliable given history of CABG.  Overall, intermediate risk study.   LV perfusion is abnormal. There is no evidence of ischemia. There is evidence of infarction. Defect 1: There is a large defect with severe reduction in uptake present in the apical to basal inferior, inferolateral and inferoseptal location(s) that is fixed. There is abnormal wall motion in the defect area. Consistent with infarction. Defect 2: There is a small defect with mild reduction in uptake present in the apex location(s) that is fixed. There is abnormal wall motion in the defect area. Consistent with infarction.   Rest left ventricular function is abnormal. Rest global function is moderately reduced. There were multiple regional abnormalities. Rest EF: 32 %. Stress left ventricular function is abnormal. Stress global function is moderately reduced. There were multiple regional abnormalities. Stress EF: 39 %. End diastolic cavity size is normal. End systolic cavity size is normal.   Myocardial blood flow was computed to be 0.62ml/g/min at rest and 1.105ml/g/min at stress. Global myocardial blood flow reserve was 1.82 and was abnormal.  Howevere flows are not reliable in setting of prior CABG   Coronary calcium was present on the attenuation correction CT images. Severe  coronary calcifications were present. Coronary calcifications were present in the left anterior descending artery, left circumflex artery and right coronary artery distribution(s).   Findings are consistent with infarction. The study is intermediate risk.   Electronically signed by Epifanio Lesches, MD   CLINICAL DATA:  This over-read does not include interpretation of cardiac or coronary anatomy or pathology. The cardiac PET interpretation by the cardiologist is attached.   COMPARISON:  None Available.   FINDINGS: Atherosclerotic calcifications in the thoracic aorta. Status post median sternotomy for CABG including LIMA to the LAD. Within the visualized portions of the thorax there are no suspicious appearing pulmonary nodules or masses, there is no acute consolidative airspace disease, no pleural effusions, no pneumothorax and no lymphadenopathy. Visualized portions of the upper abdomen are unremarkable. There are no aggressive appearing lytic or blastic lesions noted in the visualized portions of the skeleton.   IMPRESSION: 1.  Aortic Atherosclerosis (ICD10-I70.0).     Electronically Signed        Recent Labs: 03/19/2022: BUN 27; Creatinine, Ser 1.10; Hemoglobin 15.6; Potassium 4.2; Sodium 140  Recent Lipid Panel    Component Value Date/Time   CHOL 121 12/15/2021 1045   TRIG 45 12/15/2021 1045   HDL 51 12/15/2021 1045   CHOLHDL 2.4 12/15/2021 1045   CHOLHDL 2.3 10/10/2015 1428   VLDL 9 10/10/2015 1428   LDLCALC 59 12/15/2021 1045     Risk Assessment/Calculations:    CHA2DS2-VASc Score = 5   This indicates a 7.2% annual risk of stroke. The patient's score is based upon: CHF History: 1 HTN History: 1 Diabetes History: 0 Stroke History: 0 Vascular Disease History: 1 Age Score: 2 Gender Score: 0               Physical Exam:    VS:  BP 100/70   Pulse (!) 111   Ht 5\' 8"  (1.727 m)   Wt 203 lb (92.1 kg)   SpO2 97%   BMI 30.87 kg/m     Wt Readings  from Last 3 Encounters:  12/27/22 203 lb (92.1 kg)  06/29/22 205 lb 12.8 oz (93.4 kg)  04/16/22 204  lb (92.5 kg)     GEN:  Well nourished, well developed in no acute distress HEENT: Normal NECK: No JVD; No carotid bruits LYMPHATICS: No lymphadenopathy CARDIAC: IRRR, no murmurs, rubs, gallops RESPIRATORY:  Clear to auscultation without rales, wheezing or rhonchi  ABDOMEN: Soft, non-tender, non-distended MUSCULOSKELETAL:  tr edema; No deformity  SKIN: Warm and dry NEUROLOGIC:  Alert and oriented x 3 PSYCHIATRIC:  Normal affect   ASSESSMENT:    1. Atrial flutter, unspecified type (HCC)   2. Coronary artery disease of bypass graft of native heart with stable angina pectoris (HCC)   3. Chronic systolic heart failure (HCC)   4. Hyperlipidemia LDL goal <70     PLAN:    In order of problems listed above:  CAD s/p CABG: Continue aspirin and statin.  He has no significant anginal symptoms. PET scan with no ischemia. Old inferior infarct.   Chronic systolic heart failure: Euvolemic on physical exam. Echo with EF 25-30%. 32% by PET CT. He is on Entresto, aldactone, Jardiance, lasix. No beta blocker given history of bradycardia. I doubt he would tolerate higher Entresto dose given soft BP. Will continue. Wanted to defer ICD  Hypertension: Blood pressure controlled  Hyperlipidemia: Continue Lipitor. Will update labs today  PAF: Occurred in the postop setting.  Now with  persistent atrial flutter. Had DCCV with early return of flutter. Plan rate control strategy and anticoagulation. He is asymptomatic   Hypothyroidism - on Synthroid. Check TSH           Medication Adjustments/Labs and Tests Ordered: Current medicines are reviewed at length with the patient today.  Concerns regarding medicines are outlined above.  No orders of the defined types were placed in this encounter.  No orders of the defined types were placed in this encounter.   There are no Patient Instructions on  file for this visit.   Signed, Cristan Scherzer [place], MD  12/27/2022 9:23 AM    [facility]

## 2022-12-27 ENCOUNTER — Encounter: Payer: Self-pay | Admitting: Cardiology

## 2022-12-27 ENCOUNTER — Ambulatory Visit: Payer: Medicare Other | Attending: Cardiology | Admitting: Cardiology

## 2022-12-27 VITALS — BP 100/70 | HR 111 | Ht 68.0 in | Wt 203.0 lb

## 2022-12-27 DIAGNOSIS — I25708 Atherosclerosis of coronary artery bypass graft(s), unspecified, with other forms of angina pectoris: Secondary | ICD-10-CM | POA: Diagnosis present

## 2022-12-27 DIAGNOSIS — E785 Hyperlipidemia, unspecified: Secondary | ICD-10-CM

## 2022-12-27 DIAGNOSIS — I5022 Chronic systolic (congestive) heart failure: Secondary | ICD-10-CM

## 2022-12-27 DIAGNOSIS — I4892 Unspecified atrial flutter: Secondary | ICD-10-CM

## 2022-12-27 NOTE — Patient Instructions (Signed)
Medication Instructions:  Continue same medications *If you need a refill on your cardiac medications before your next appointment, please call your pharmacy*   Lab Work: Cbc,cmet,lipid panel,tsh today   Testing/Procedures: None ordered   Follow-Up: At [facility], you and your health needs are our priority.  As part of our continuing mission to provide you with exceptional heart care, we have created designated Provider Care Teams.  These Care Teams include your primary Cardiologist (physician) and Advanced Practice Providers (APPs -  Physician Assistants and Nurse Practitioners) who all work together to provide you with the care you need, when you need it.  We recommend signing up for the patient portal called "MyChart".  Sign up information is provided on this After Visit Summary.  MyChart is used to connect with patients for Virtual Visits (Telemedicine).  Patients are able to view lab/test results, encounter notes, upcoming appointments, etc.  Non-urgent messages can be sent to your provider as well.   To learn more about what you can do with MyChart, go to [email or web address].    Your next appointment:  6 months   Call in Feb to schedule June appointment     Provider:  Dr.Jordan

## 2022-12-28 ENCOUNTER — Telehealth: Payer: Self-pay | Admitting: Cardiology

## 2022-12-28 LAB — LIPID PANEL
Chol/HDL Ratio: 2.8 {ratio} (ref 0.0–5.0)
Cholesterol, Total: 132 mg/dL (ref 100–199)
HDL: 48 mg/dL (ref >39)
LDL Chol Calc (NIH): 72 mg/dL (ref 0–99)
Triglycerides: 58 mg/dL (ref 0–149)
VLDL Cholesterol Cal: 12 mg/dL (ref 5–40)

## 2022-12-28 LAB — CBC WITH DIFFERENTIAL/PLATELET
Basophils Absolute: 0.1 10*3/uL (ref 0.0–0.2)
Basos: 1 %
EOS (ABSOLUTE): 0.3 10*3/uL (ref 0.0–0.4)
Eos: 5 %
Hematocrit: 48 % (ref 37.5–51.0)
Hemoglobin: 15.7 g/dL (ref 13.0–17.7)
Immature Grans (Abs): 0 10*3/uL (ref 0.0–0.1)
Immature Granulocytes: 0 %
Lymphocytes Absolute: 1.3 10*3/uL (ref 0.7–3.1)
Lymphs: 21 %
MCH: 30.5 pg (ref 26.6–33.0)
MCHC: 32.7 g/dL (ref 31.5–35.7)
MCV: 93 fL (ref 79–97)
Monocytes Absolute: 0.7 10*3/uL (ref 0.1–0.9)
Monocytes: 12 %
Neutrophils Absolute: 3.7 10*3/uL (ref 1.4–7.0)
Neutrophils: 61 %
Platelets: 250 10*3/uL (ref 150–450)
RBC: 5.15 x10E6/uL (ref 4.14–5.80)
RDW: 12.8 % (ref 11.6–15.4)
WBC: 6.1 10*3/uL (ref 3.4–10.8)

## 2022-12-28 LAB — COMPREHENSIVE METABOLIC PANEL
ALT: 33 [IU]/L (ref 0–44)
AST: 27 [IU]/L (ref 0–40)
Albumin: 4.1 g/dL (ref 3.8–4.8)
Alkaline Phosphatase: 86 [IU]/L (ref 44–121)
BUN/Creatinine Ratio: 20 (ref 10–24)
BUN: 25 mg/dL (ref 8–27)
Bilirubin Total: 0.5 mg/dL (ref 0.0–1.2)
CO2: 25 mmol/L (ref 20–29)
Calcium: 9.8 mg/dL (ref 8.6–10.2)
Chloride: 102 mmol/L (ref 96–106)
Creatinine, Ser: 1.24 mg/dL (ref 0.76–1.27)
Globulin, Total: 2 g/dL (ref 1.5–4.5)
Glucose: 95 mg/dL (ref 70–99)
Potassium: 4.6 mmol/L (ref 3.5–5.2)
Sodium: 139 mmol/L (ref 134–144)
Total Protein: 6.1 g/dL (ref 6.0–8.5)
eGFR: 60 mL/min/{1.73_m2} (ref >59)

## 2022-12-28 LAB — TSH: TSH: 2.98 u[IU]/mL (ref 0.450–4.500)

## 2022-12-28 NOTE — Telephone Encounter (Signed)
Spoke to patient recent lab results given. 

## 2022-12-28 NOTE — Telephone Encounter (Signed)
Follow Up:     Patient is retuning Cheryl's call,concerning his lab results.

## 2023-03-25 ENCOUNTER — Other Ambulatory Visit: Payer: Self-pay

## 2023-03-25 MED ORDER — RIVAROXABAN 20 MG PO TABS: 20.0000 mg | ORAL_TABLET | Freq: Every day | ORAL | 5 refills | Status: DC

## 2023-03-25 NOTE — Telephone Encounter (Signed)
 Prescription refill request for Xarelto received.  Indication: Aflutter Last office visit: 12/27/22 ([place])  Weight: 92.1kg Age: 78 Scr: 1.24 (12/27/22)  CrCl: 64.75ml/min  Appropriate dose. Refill sent.

## 2023-04-01 ENCOUNTER — Other Ambulatory Visit: Payer: Self-pay | Admitting: Cardiology

## 2023-06-16 ENCOUNTER — Other Ambulatory Visit: Payer: Self-pay | Admitting: Cardiology

## 2023-06-29 ENCOUNTER — Other Ambulatory Visit: Payer: Self-pay | Admitting: Cardiology

## 2023-07-01 ENCOUNTER — Other Ambulatory Visit: Payer: Self-pay | Admitting: Cardiology

## 2023-07-01 NOTE — Telephone Encounter (Signed)
 Rx refill sent to pharmacy.

## 2023-09-27 ENCOUNTER — Other Ambulatory Visit: Payer: Self-pay | Admitting: Cardiology

## 2023-09-30 NOTE — Telephone Encounter (Signed)
 Prescription refill request for Xarelto  received.  Indication: a fib Last office visit: dvt Weight: 203# Age: 78 Scr: 1.24 epic 12/27/22 CrCl:

## 2023-10-17 ENCOUNTER — Telehealth: Payer: Self-pay | Admitting: Cardiology

## 2023-10-17 NOTE — Telephone Encounter (Signed)
 Pt c/o medication issue:  1. Name of Medication:  empagliflozin  (JARDIANCE ) 10 MG TABS tablet  2. How are you currently taking this medication (dosage and times per day)?   3. Are you having a reaction (difficulty breathing--STAT)?   4. What is your medication issue?   Meredith with [employer] says they need updated ICD 10 code for Jardiance . Please call [phone] and provide code RAF.

## 2023-10-17 NOTE — Telephone Encounter (Signed)
 Returned call to [place] with [employer].ICD 10 code I25.10,I50.22.

## 2023-10-24 NOTE — Progress Notes (Unsigned)
 Cardiology Office Note:    Date:  11/06/2023   ID:  Ryan Ward, DOB 10/12/1945, MRN [id number]  PCP:  Loring Tanda Mae, MD   [facility] Providers Cardiologist:  Gillie Fleites, MD Electrophysiologist:  Elspeth Sage, MD (Inactive)     Referring MD: Loring Tanda Mae, *   Chief Complaint  Patient presents with   Congestive Heart Failure   Coronary Artery Disease    History of Present Illness:    Ryan Ward is a 78 y.o. male with a hx of CAD s/p CABG, chronic systolic HF, h/o RLE DVT/PE, HTN, HLD, OSA and PAF.  Patient had a history of inferior ST elevated MI in December 2016.  Cardiac catheterization showed occluded RCA and severe LAD and circumflex disease.  RCA was successfully treated with drug-eluting stent.  Periprocedural course complicated by cardiogenic shock, ventricular fibrillation and a complete heart block.  He also developed atrial fibrillation.  During the same admission, he developed right lower extremity DVT and the right upper lobe pulmonary emboli.  He was placed on Xarelto  and completed the course of anticoagulation.  He underwent relook cardiac catheterization in March 2016 which showed patency of the RCA stent with a residual distal RPDA disease as well as proximal to mid LAD disease and also left circumflex disease.  He underwent successful CABG x4 (LIMA to LAD, SVG to diagonal, sequential SVG to proximal RPDA and the mid RPDA).  Postop course was complicated by atrial fibrillation that was treated successfully with amiodarone  therapy.  After discharge, he developed heart failure and was treated with increased dose of Lasix .  Due to significant bradycardia on follow-up, metoprolol  and amiodarone  were discontinued.  Follow-up echo in July 2017 showed EF improved to 35 to 40%.  ICD was deferred.  Event monitor did not show any recurrence of atrial fibrillation as well.   In Feb 2022 he had osteomyelitis of the right hallux and underwent  amputation.  He has since been seen on several occasions by Josefa Beauvais NP for titration of heart failure therapy. Repeat Echo in July 2023 showed EF 25-30%. He was referred to EP for consideration of ICD. When seen by Dr Sage in Feb 2024 was noted to be in Atrial flutter. Was anticoagulated and underwent DCCV on 03/19/22. When seen back in Afib clinic in April had early return of Atrial flutter. Rate was controlled. Discussion had concerning AAD/ablation but patient favored continued therapy with rate control. He did have a PET CT which showed evidence of old inferior infarct. No ischemia. EF 32%.   On follow up today he is feeling well. Denies any chest pain, palpitations, dyspnea. Edema is unchanged. No significant dizziness. Overall feels well. Working part time for [employer]. Yesterday cut up a tree with a chain saw and hauled it off.    Past Medical History:  Diagnosis Date   Asthma    VERY MILD   CAD (coronary artery disease)    a. 12/07/14 Inf STEMI/PCI: LAD 85p/m, 68m/d, LCX 80ost, RCA 30p/m, 77m/d, 100d (4.0x20 Promus Premier DES). Hosp course complicated by VF arrest, CGS, CHB req temp wire; b. 03/2015 Cath: LAD 85p/m, 95d, LCx 80ost, RCA 30-40p/m/d, RPDA 75, EF 30-35%, Nl CO; c. 04/06/2015 CABGx4 (LIMA->LAD, VG->Diag, Seq VG->prox RPDA->mid RPDA.   Chronic systolic CHF (congestive heart failure) (HCC)    a. 03/2015 Echo: EF 30-35%, diff HK.   DVT (deep venous thrombosis) (HCC) 12/2014   RLE   History of blood transfusion 04/2013   S/P CABG  Hyperlipidemia    Hypertension    Hypertensive heart disease    Ischemic cardiomyopathy    a. 03/2015 Echo: EF 30-35%, diff HK, mild MR, mod dil RA, PASP .   Myocardial infarction (HCC) 12/2014   OSA (obstructive sleep apnea)    a. not using CPAP at home, does have machine (06/20/2017)   Paroxysmal atrial fibrillation (HCC)    a. 12/2014 In setting of Inf MI->convertred spont.   Pulmonary embolism (HCC)    a. 12/2014 R PT and peroneal vein  DVT and RUL PE-->Xarelto  x 3 mos.    Past Surgical History:  Procedure Laterality Date   CARDIAC CATHETERIZATION N/A 12/07/2014   Procedure: Coronary Stent Intervention;  Surgeon: Anedra Penafiel M Shontia Gillooly, MD;  [facility] INVAS[facility] CV LAB;  Service: Cardiovascular;  Laterality: N/A;  STEMI   CARDIAC CATHETERIZATION N/A 12/07/2014   Procedure: IABP Insertion;  Surgeon: Sherene Plancarte M Dawnmarie Breon, MD;  Location: [facility] INVASIVE CV LAB;  Service: Cardiovascular;  Laterality: N/A;   CARDIAC CATHETERIZATION N/A 12/07/2014   Procedure: Temporary Pacemaker;  Surgeon: Cerra Eisenhower M Sanna Porcaro, MD;  Location: [facility] INVASIVE CV LAB;  Service: Cardiovascular;  Laterality: N/A;   CARDIAC CATHETERIZATION N/A 12/07/2014   Procedure: Left Heart Cath and Coronary Angiography;  Surgeon: Asalee Barrette M Azul Brumett, MD;  Location: [facility] INVASIVE CV LAB;  Service: Cardiovascular;  Laterality: N/A;   CARDIAC CATHETERIZATION N/A 03/29/2015   Procedure: Right/Left Heart Cath and Coronary Angiography;  Surgeon: Jaleia Hanke M Derk Doubek, MD;  Location: [facility] INVASIVE CV LAB;  Service: Cardiovascular;  Laterality: N/A;   CARDIOVERSION N/A 03/19/2022   Procedure: CARDIOVERSION;  Surgeon: Santo Stanly LABOR, MD;  Location: [facility] ENDOSCOPY;  Service: Cardiovascular;  Laterality: N/A;   CORONARY ARTERY BYPASS GRAFT N/A 04/06/2015   Procedure: CORONARY ARTERY BYPASS GRAFT TIMES FOUR  USING LEFT INTERNAL MAMMARY ARTERY TO THE LEFT ANTERIOR DESCENDING, BILATERAL GREATER SAPHENOUS ENDOVEIN HARVEST GRAFT TO PROXIMAL AND MID POSTERIOR DESCENDING AND DIAGONAL CORONARY ARTERIES AND PLACEMENT OR RIGHT FEMORAL A-LINE. ;  Surgeon: Dallas KATHEE Jude, MD;  Location: [facility] OR;  Service: Open Heart Surgery;  Laterality: N/A;   FRACTURE SURGERY     HERNIA REPAIR     I & D EXTREMITY Right 02/25/2020   Procedure: Right hallux ulcer irrigation and excisional debridement amputation;  Surgeon: Kit Rush, MD;  Location: [facility] OR;  Service: Orthopedics;  Laterality: Right;    OPEN REDUCTION INTERNAL FIXATION  (ORIF) PROXIMAL PHALANX Right 06/19/2017   Procedure: OPEN REDUCTION INTERNAL FIXATION (ORIF) PROXIMAL PHALANX;  Surgeon: Kendal Franky SQUIBB, MD;  Location: [facility] OR;  Service: Orthopedics;  Laterality: Right;   ORIF ANKLE FRACTURE Right 06/19/2017   Procedure: OPEN REDUCTION INTERNAL FIXATION (ORIF) ANKLE FRACTURE;  Surgeon: Kendal Franky SQUIBB, MD;  Location: [facility] OR;  Service: Orthopedics;  Laterality: Right;   ORIF ELBOW FRACTURE Right 06/19/2017   Procedure: OPEN REDUCTION ELBOW DISLOCATION WITH RADIAL HEAD REPLACEMENT;  Surgeon: Kendal Franky SQUIBB, MD;  Location: [facility] OR;  Service: Orthopedics;  Laterality: Right;   TEE WITHOUT CARDIOVERSION N/A 04/06/2015   Procedure: TRANSESOPHAGEAL ECHOCARDIOGRAM (TEE);  Surgeon: Dallas KATHEE Jude, MD;  Location: [facility] OR;  Service: Open Heart Surgery;  Laterality: N/A;   TONSILLECTOMY     UMBILICAL HERNIA REPAIR      Current Medications: Current Meds  Medication Sig   Cholecalciferol  (VITAMIN D3) 5000 UNITS TABS Take 5,000 Units by mouth daily.    Multiple Vitamin (MULTIVITAMIN WITH MINERALS) TABS tablet Take 1 tablet by mouth daily.   ondansetron  (ZOFRAN ) 4 MG  tablet Take 1 tablet (4 mg total) by mouth daily as needed for nausea or vomiting.   [DISCONTINUED] atorvastatin  (LIPITOR ) 80 MG tablet TAKE 1/2 TABLET BY MOUTH DAILY   [DISCONTINUED] empagliflozin  (JARDIANCE ) 10 MG TABS tablet TAKE ONE TABLET BY MOUTH EVERY MORNING BEFORE BREAKFAST   [DISCONTINUED] furosemide  (LASIX ) 40 MG tablet Take 1 tablet (40 mg total) by mouth every other day.   [DISCONTINUED] levothyroxine  (SYNTHROID ) 50 MCG tablet TAKE 1 TABLET BY MOUTH 2 TIMES A DAY   [DISCONTINUED] potassium chloride  SA (KLOR-CON  M20) 20 MEQ tablet Take 0.5 tablets (10 mEq total) by mouth every other day.   [DISCONTINUED] sacubitril-valsartan (ENTRESTO ) 24-26 MG TAKE 1 TABLET BY MOUTH 2 TIMES A DAY   [DISCONTINUED] spironolactone  (ALDACTONE ) 25 MG tablet TAKE 1 TABLET BY MOUTH DAILY   [DISCONTINUED] XARELTO  20 MG TABS  tablet TAKE 1 TABLET BY MOUTH ONCE DAILY WITH SUPPER     Allergies:   Oxycodone    Social History   Socioeconomic History   Marital status: Widowed    Spouse name: Not on file   Number of children: Not on file   Years of education: Not on file   Highest education level: Not on file  Occupational History   Not on file  Tobacco Use   Smoking status: Never   Smokeless tobacco: Never   Tobacco comments:    Never smoke 04/16/22  Vaping Use   Vaping status: Never Used  Substance and Sexual Activity   Alcohol use: Not Currently    Alcohol/week: 0.0 standard drinks of alcohol   Drug use: Never   Sexual activity: Yes  Other Topics Concern   Not on file  Social History Narrative   Works with [employer]   Social Drivers of Corporate Investment Banker Strain: Not on file  Food Insecurity: Not on file  Transportation Needs: Not on file  Physical Activity: Not on file  Stress: Not on file  Social Connections: Not on file     Family History: The patient's family history includes Heart attack in his father.  ROS:   Please see the history of present illness.     All other systems reviewed and are negative.  EKGs/Labs/Other Studies Reviewed:    The following studies were reviewed today: Echo 07/06/21: IMPRESSIONS     1. Left ventricular ejection fraction, by estimation, is 25 to 30%. The  left ventricle has severely decreased function. The left ventricle  demonstrates global hypokinesis. The left ventricular internal cavity size  was mildly dilated. Left ventricular  diastolic function could not be evaluated. There is dyskinesis of the left  ventricular, mid-apical inferior wall. There is hypokinesis of the left  ventricular, basal-mid inferior wall.   2. Right ventricular systolic function is mildly reduced. The right  ventricular size is moderately enlarged. There is normal pulmonary artery  systolic pressure.   3. Left atrial size was moderately dilated.   4. Right  atrial size was moderately dilated.   5. The mitral valve is normal in structure. Mild mitral valve  regurgitation. No evidence of mitral stenosis.   6. The aortic valve is grossly normal. There is mild calcification of the  aortic valve. There is mild thickening of the aortic valve. Aortic valve  regurgitation is not visualized.   7. The inferior vena cava is normal in size with greater than 50%  respiratory variability, suggesting right atrial pressure of 3 mmHg.   Comparison(s): Changes from prior study are noted. While overall EF  appears similar, there are focal hypokinetic/dyskinetic segments in the  inferior wall that are now apparent compared to prior.   PET CT 04/17/22:  Narrative & Impression      Severe fixed inferior/inferoseptal/inferolateral perfusion defect consistent with infarct.  No ischemia.  Moderate systolic dysfunction (EF 32% at rest, 39% during stress).  Myocardial blood flows are not reliable given history of CABG.  Overall, intermediate risk study.   LV perfusion is abnormal. There is no evidence of ischemia. There is evidence of infarction. Defect 1: There is a large defect with severe reduction in uptake present in the apical to basal inferior, inferolateral and inferoseptal location(s) that is fixed. There is abnormal wall motion in the defect area. Consistent with infarction. Defect 2: There is a small defect with mild reduction in uptake present in the apex location(s) that is fixed. There is abnormal wall motion in the defect area. Consistent with infarction.   Rest left ventricular function is abnormal. Rest global function is moderately reduced. There were multiple regional abnormalities. Rest EF: 32 %. Stress left ventricular function is abnormal. Stress global function is moderately reduced. There were multiple regional abnormalities. Stress EF: 39 %. End diastolic cavity size is normal. End systolic cavity size is normal.   Myocardial blood flow was computed to  be 0.22ml/g/min at rest and 1.62ml/g/min at stress. Global myocardial blood flow reserve was 1.82 and was abnormal.  Howevere flows are not reliable in setting of prior CABG   Coronary calcium  was present on the attenuation correction CT images. Severe coronary calcifications were present. Coronary calcifications were present in the left anterior descending artery, left circumflex artery and right coronary artery distribution(s).   Findings are consistent with infarction. The study is intermediate risk.   Electronically signed by Lonni Nanas, MD   CLINICAL DATA:  This over-read does not include interpretation of cardiac or coronary anatomy or pathology. The cardiac PET interpretation by the cardiologist is attached.   COMPARISON:  None Available.   FINDINGS: Atherosclerotic calcifications in the thoracic aorta. Status post median sternotomy for CABG including LIMA to the LAD. Within the visualized portions of the thorax there are no suspicious appearing pulmonary nodules or masses, there is no acute consolidative airspace disease, no pleural effusions, no pneumothorax and no lymphadenopathy. Visualized portions of the upper abdomen are unremarkable. There are no aggressive appearing lytic or blastic lesions noted in the visualized portions of the skeleton.   IMPRESSION: 1.  Aortic Atherosclerosis (ICD10-I70.0).     Electronically Signed   EKG Interpretation Date/Time:  Wednesday November 06 2023 16:08:35 EST Ventricular Rate:  101 PR Interval:    QRS Duration:  102 QT Interval:  356 QTC Calculation: 461 R Axis:   -18  Text Interpretation: Atrial flutter with variable A-V block with premature ventricular or aberrantly conducted complexes Low voltage QRS Inferior infarct (cited on or before 13-Dec-2014) Cannot rule out Anterior infarct (cited on or before 29-Mar-2015) When compared with ECG of 16-Apr-2022 11:28, ST no longer elevated in Inferior leads Non-specific change in  ST segment in Lateral leads Confirmed by Katrinna Travieso [phone]) on 11/06/2023 4:21:55 PM    Recent Labs: 12/27/2022: ALT 33; BUN 25; Creatinine, Ser 1.24; Hemoglobin 15.7; Platelets 250; Potassium 4.6; Sodium 139; TSH 2.980  Recent Lipid Panel    Component Value Date/Time   CHOL 132 12/27/2022 0944   TRIG 58 12/27/2022 0944   HDL 48 12/27/2022 0944   CHOLHDL 2.8 12/27/2022 0944   CHOLHDL 2.3 10/10/2015 1428  VLDL 9 10/10/2015 1428   LDLCALC 72 12/27/2022 0944   EKG Interpretation Date/Time:  Wednesday November 06 2023 16:08:35 EST Ventricular Rate:  101 PR Interval:    QRS Duration:  102 QT Interval:  356 QTC Calculation: 461 R Axis:   -18  Text Interpretation: Atrial flutter with variable A-V block with premature ventricular or aberrantly conducted complexes Low voltage QRS Inferior infarct (cited on or before 13-Dec-2014) Cannot rule out Anterior infarct (cited on or before 29-Mar-2015) When compared with ECG of 16-Apr-2022 11:28, ST no longer elevated in Inferior leads Non-specific change in ST segment in Lateral leads Confirmed by Sianni Cloninger [phone]) on 11/06/2023 4:21:55 PM    Risk Assessment/Calculations:    CHA2DS2-VASc Score =     This indicates a  % annual risk of stroke. The patient's score is based upon:     {This patient has a significant risk of stroke if diagnosed with atrial fibrillation.  Please consider VKA or DOAC agent for anticoagulation if the bleeding risk is acceptable.   You can also use the SmartPhrase .[email or web address] for documentation.   :[id number]}            Physical Exam:    VS:  BP 96/66   Pulse (!) 101   Ht 5' 8 (1.727 m)   Wt 206 lb (93.4 kg)   SpO2 97%   BMI 31.32 kg/m     Wt Readings from Last 3 Encounters:  11/06/23 206 lb (93.4 kg)  12/27/22 203 lb (92.1 kg)  06/29/22 205 lb 12.8 oz (93.4 kg)     GEN:  Well nourished, well developed in no acute distress HEENT: Normal NECK: No JVD; No carotid bruits LYMPHATICS: No  lymphadenopathy CARDIAC: IRRR, no murmurs, rubs, gallops RESPIRATORY:  Clear to auscultation without rales, wheezing or rhonchi  ABDOMEN: Soft, non-tender, non-distended MUSCULOSKELETAL:  tr edema; No deformity  SKIN: Warm and dry NEUROLOGIC:  Alert and oriented x 3 PSYCHIATRIC:  Normal affect   ASSESSMENT:    1. Coronary artery disease of bypass graft of native heart with stable angina pectoris   2. Atrial flutter, unspecified type (HCC)   3. Hypokalemia      PLAN:    In order of problems listed above:  CAD s/p CABG: Continue aspirin  and statin.  He has no significant anginal symptoms. PET scan with no ischemia. Old inferior infarct.   Chronic systolic heart failure: Euvolemic on physical exam. Echo with EF 25-30%. 32% by PET CT. He is on Entresto , aldactone , Jardiance , lasix . No beta blocker given history of bradycardia. Unable to titrate medication further due to low BP.  Will continue. Wanted to defer ICD  Hypertension: Blood pressure controlled  Hyperlipidemia: Continue Lipitor . Will update labs   PAF: Occurred in the postop setting.  Now with  persistent atrial flutter. Had DCCV with early return of flutter. Plan rate control strategy and anticoagulation. He is asymptomatic. Rate is OK   Hypothyroidism - on Synthroid . Check TSH       Will order lab today. Follow up in 6 months    Medication Adjustments/Labs and Tests Ordered: Current medicines are reviewed at length with the patient today.  Concerns regarding medicines are outlined above.  Orders Placed This Encounter  Procedures   EKG 12-Lead   Meds ordered this encounter  Medications   sacubitril-valsartan (ENTRESTO ) 24-26 MG    Sig: Take 1 tablet by mouth 2 (two) times daily.    Dispense:  180 tablet    Refill:  3  potassium chloride  SA (KLOR-CON  M20) 20 MEQ tablet    Sig: Take 0.5 tablets (10 mEq total) by mouth every other day.    Dispense:  45 tablet    Refill:  3   empagliflozin  (JARDIANCE ) 10 MG  TABS tablet    Sig: Take 1 tablet (10 mg total) by mouth daily with breakfast.    Dispense:  90 tablet    Refill:  3   rivaroxaban  (XARELTO ) 20 MG TABS tablet    Sig: Take 1 tablet (20 mg total) by mouth daily with supper.    Dispense:  90 tablet    Refill:  3   spironolactone  (ALDACTONE ) 25 MG tablet    Sig: Take 1 tablet (25 mg total) by mouth daily.    Dispense:  90 tablet    Refill:  3   atorvastatin  (LIPITOR ) 80 MG tablet    Sig: Take 0.5 tablets (40 mg total) by mouth daily.    Dispense:  45 tablet    Refill:  3   furosemide  (LASIX ) 40 MG tablet    Sig: Take 1 tablet (40 mg total) by mouth every other day.    Dispense:  45 tablet    Refill:  3   levothyroxine  (SYNTHROID ) 50 MCG tablet    Sig: Take 1 tablet (50 mcg total) by mouth 2 (two) times daily.    Dispense:  180 tablet    Refill:  3    There are no Patient Instructions on file for this visit.   Signed, Francisco Ostrovsky, MD  11/06/2023 4:33 PM    [facility]

## 2023-11-06 ENCOUNTER — Encounter: Payer: Self-pay | Admitting: Cardiology

## 2023-11-06 ENCOUNTER — Ambulatory Visit: Attending: Cardiology | Admitting: Cardiology

## 2023-11-06 VITALS — BP 96/66 | HR 101 | Ht 68.0 in | Wt 206.0 lb

## 2023-11-06 DIAGNOSIS — E039 Hypothyroidism, unspecified: Secondary | ICD-10-CM | POA: Diagnosis present

## 2023-11-06 DIAGNOSIS — E876 Hypokalemia: Secondary | ICD-10-CM | POA: Diagnosis present

## 2023-11-06 DIAGNOSIS — I4892 Unspecified atrial flutter: Secondary | ICD-10-CM | POA: Diagnosis present

## 2023-11-06 DIAGNOSIS — I25708 Atherosclerosis of coronary artery bypass graft(s), unspecified, with other forms of angina pectoris: Secondary | ICD-10-CM | POA: Insufficient documentation

## 2023-11-06 MED ORDER — POTASSIUM CHLORIDE CRYS ER 20 MEQ PO TBCR: 10.0000 meq | EXTENDED_RELEASE_TABLET | ORAL | 3 refills | Status: AC

## 2023-11-06 MED ORDER — RIVAROXABAN 20 MG PO TABS: 20.0000 mg | ORAL_TABLET | Freq: Every day | ORAL | 3 refills | Status: AC

## 2023-11-06 MED ORDER — SACUBITRIL-VALSARTAN 24-26 MG PO TABS: 1.0000 | ORAL_TABLET | Freq: Two times a day (BID) | ORAL | 3 refills | Status: AC

## 2023-11-06 MED ORDER — ATORVASTATIN CALCIUM 80 MG PO TABS: 40.0000 mg | ORAL_TABLET | Freq: Every day | ORAL | 3 refills | Status: AC

## 2023-11-06 MED ORDER — LEVOTHYROXINE SODIUM 50 MCG PO TABS: 50.0000 ug | ORAL_TABLET | Freq: Two times a day (BID) | ORAL | 3 refills | Status: AC

## 2023-11-06 MED ORDER — FUROSEMIDE 40 MG PO TABS: 40.0000 mg | ORAL_TABLET | ORAL | 3 refills | Status: AC

## 2023-11-06 MED ORDER — EMPAGLIFLOZIN 10 MG PO TABS: 10.0000 mg | ORAL_TABLET | Freq: Every day | ORAL | 3 refills | Status: AC

## 2023-11-06 MED ORDER — SPIRONOLACTONE 25 MG PO TABS: 25.0000 mg | ORAL_TABLET | Freq: Every day | ORAL | 3 refills | Status: AC

## 2023-11-06 NOTE — Patient Instructions (Signed)
 Medication Instructions:  Continue same medications *If you need a refill on your cardiac medications before your next appointment, please call your pharmacy*  Lab Work: Cbc,cmet,lipid panel,tsh to be done 11/6  Testing/Procedures: None ordered  Follow-Up: At [facility], you and your health needs are our priority.  As part of our continuing mission to provide you with exceptional heart care, our providers are all part of one team.  This team includes your primary Cardiologist (physician) and Advanced Practice Providers or APPs (Physician Assistants and Nurse Practitioners) who all work together to provide you with the care you need, when you need it.  Your next appointment:  6 months    Call in Feb to schedule May appointment    Provider:  Dr.Jordan   We recommend signing up for the patient portal called MyChart.  Sign up information is provided on this After Visit Summary.  MyChart is used to connect with patients for Virtual Visits (Telemedicine).  Patients are able to view lab/test results, encounter notes, upcoming appointments, etc.  Non-urgent messages can be sent to your provider as well.   To learn more about what you can do with MyChart, go to [email or web address].

## 2023-11-14 ENCOUNTER — Ambulatory Visit: Payer: Self-pay | Admitting: Cardiology

## 2023-11-14 LAB — CBC WITH DIFFERENTIAL/PLATELET
Basophils Absolute: 0.1 x10E3/uL (ref 0.0–0.2)
Basos: 1 %
EOS (ABSOLUTE): 0.3 x10E3/uL (ref 0.0–0.4)
Eos: 4 %
Hematocrit: 45 % (ref 37.5–51.0)
Hemoglobin: 14.9 g/dL (ref 13.0–17.7)
Immature Grans (Abs): 0 x10E3/uL (ref 0.0–0.1)
Immature Granulocytes: 0 %
Lymphocytes Absolute: 2.3 x10E3/uL (ref 0.7–3.1)
Lymphs: 30 %
MCH: 30.3 pg (ref 26.6–33.0)
MCHC: 33.1 g/dL (ref 31.5–35.7)
MCV: 92 fL (ref 79–97)
Monocytes Absolute: 0.9 x10E3/uL (ref 0.1–0.9)
Monocytes: 11 %
Neutrophils Absolute: 4.3 x10E3/uL (ref 1.4–7.0)
Neutrophils: 54 %
Platelets: 234 x10E3/uL (ref 150–450)
RBC: 4.91 x10E6/uL (ref 4.14–5.80)
RDW: 13 % (ref 11.6–15.4)
WBC: 7.9 x10E3/uL (ref 3.4–10.8)

## 2023-11-14 LAB — COMPREHENSIVE METABOLIC PANEL WITH GFR
ALT: 28 IU/L (ref 0–44)
AST: 28 IU/L (ref 0–40)
Albumin: 4.3 g/dL (ref 3.8–4.8)
Alkaline Phosphatase: 94 IU/L (ref 47–123)
BUN/Creatinine Ratio: 21 (ref 10–24)
BUN: 25 mg/dL (ref 8–27)
Bilirubin Total: 0.7 mg/dL (ref 0.0–1.2)
CO2: 25 mmol/L (ref 20–29)
Calcium: 9.6 mg/dL (ref 8.6–10.2)
Chloride: 104 mmol/L (ref 96–106)
Creatinine, Ser: 1.2 mg/dL (ref 0.76–1.27)
Globulin, Total: 2 g/dL (ref 1.5–4.5)
Glucose: 87 mg/dL (ref 70–99)
Potassium: 4.4 mmol/L (ref 3.5–5.2)
Sodium: 141 mmol/L (ref 134–144)
Total Protein: 6.3 g/dL (ref 6.0–8.5)
eGFR: 62 mL/min/1.73 (ref >59)

## 2023-11-14 LAB — LIPID PANEL
Chol/HDL Ratio: 2.7 ratio (ref 0.0–5.0)
Cholesterol, Total: 136 mg/dL (ref 100–199)
HDL: 51 mg/dL (ref >39)
LDL Chol Calc (NIH): 71 mg/dL (ref 0–99)
Triglycerides: 69 mg/dL (ref 0–149)
VLDL Cholesterol Cal: 14 mg/dL (ref 5–40)

## 2023-11-14 LAB — TSH: TSH: 2.18 u[IU]/mL (ref 0.450–4.500)

## 2024-01-10 ENCOUNTER — Telehealth: Payer: Self-pay

## 2024-01-10 NOTE — Telephone Encounter (Signed)
 I contacted Ryan Ward to offer to schedule him for a genetic counseling visit. His daughter completed genetic testing and tested positive for a genetic variant associated for increased risk for prostate cancer. He was not available and a voice mail message was left.  Santana Fryer, MS, CGC  Certified Genetic Counselor  Email: [email or web address]  Phone: [phone]

## 2024-01-17 NOTE — Telephone Encounter (Signed)
 [name] returned my call he was unaware of his daughter's genetic testing and plans to contact her to learn more and may call back after that to schedule.   Santana Fryer, MS, CGC  Certified Genetic Counselor  Email: [email or web address]  Phone: [phone]
